# Patient Record
Sex: Female | Born: 1950 | Race: White | Hispanic: No | Marital: Married | Smoking: Former smoker
Health system: Southern US, Community
[De-identification: ages and names within clinical notes are randomized; demographics above are authoritative.]

## PROBLEM LIST (undated history)

## (undated) DIAGNOSIS — N200 Calculus of kidney: Secondary | ICD-10-CM

## (undated) DIAGNOSIS — E119 Type 2 diabetes mellitus without complications: Secondary | ICD-10-CM

## (undated) DIAGNOSIS — I714 Abdominal aortic aneurysm, without rupture, unspecified: Secondary | ICD-10-CM

## (undated) DIAGNOSIS — I5030 Unspecified diastolic (congestive) heart failure: Secondary | ICD-10-CM

## (undated) DIAGNOSIS — E519 Thiamine deficiency, unspecified: Secondary | ICD-10-CM

## (undated) DIAGNOSIS — G629 Polyneuropathy, unspecified: Secondary | ICD-10-CM

## (undated) DIAGNOSIS — S91309A Unspecified open wound, unspecified foot, initial encounter: Secondary | ICD-10-CM

## (undated) DIAGNOSIS — I10 Essential (primary) hypertension: Secondary | ICD-10-CM

## (undated) DIAGNOSIS — M549 Dorsalgia, unspecified: Secondary | ICD-10-CM

## (undated) DIAGNOSIS — I639 Cerebral infarction, unspecified: Secondary | ICD-10-CM

## (undated) DIAGNOSIS — L409 Psoriasis, unspecified: Secondary | ICD-10-CM

## (undated) DIAGNOSIS — G8929 Other chronic pain: Secondary | ICD-10-CM

## (undated) DIAGNOSIS — M779 Enthesopathy, unspecified: Secondary | ICD-10-CM

## (undated) HISTORY — PX: VESICOVAGINAL FISTULA CLOSURE W/ TAH: SUR271

## (undated) HISTORY — PX: BLADDER SUSPENSION: SHX72

## (undated) HISTORY — PX: ABDOMINAL HYSTERECTOMY: SHX81

---

## 2007-05-25 ENCOUNTER — Ambulatory Visit (HOSPITAL_COMMUNITY): Admission: RE | Admit: 2007-05-25 | Discharge: 2007-05-25 | Payer: Self-pay | Admitting: Family Medicine

## 2010-10-31 ENCOUNTER — Ambulatory Visit (HOSPITAL_COMMUNITY)
Admission: RE | Admit: 2010-10-31 | Discharge: 2010-10-31 | Payer: Self-pay | Source: Home / Self Care | Attending: Internal Medicine | Admitting: Internal Medicine

## 2012-03-08 ENCOUNTER — Other Ambulatory Visit: Payer: Self-pay | Admitting: Urology

## 2012-03-08 ENCOUNTER — Ambulatory Visit (INDEPENDENT_AMBULATORY_CARE_PROVIDER_SITE_OTHER): Payer: BC Managed Care – PPO | Admitting: Urology

## 2012-03-08 DIAGNOSIS — R1900 Intra-abdominal and pelvic swelling, mass and lump, unspecified site: Secondary | ICD-10-CM

## 2012-03-08 DIAGNOSIS — R3129 Other microscopic hematuria: Secondary | ICD-10-CM

## 2012-03-17 ENCOUNTER — Ambulatory Visit (HOSPITAL_COMMUNITY)
Admission: RE | Admit: 2012-03-17 | Discharge: 2012-03-17 | Disposition: A | Discharge status: Home / Self Care | Payer: BC Managed Care – PPO | Source: Ambulatory Visit | Attending: Urology | Admitting: Urology

## 2012-03-17 DIAGNOSIS — N200 Calculus of kidney: Secondary | ICD-10-CM | POA: Insufficient documentation

## 2012-03-17 DIAGNOSIS — K7689 Other specified diseases of liver: Secondary | ICD-10-CM | POA: Insufficient documentation

## 2012-03-17 DIAGNOSIS — R599 Enlarged lymph nodes, unspecified: Secondary | ICD-10-CM | POA: Insufficient documentation

## 2012-03-17 DIAGNOSIS — R197 Diarrhea, unspecified: Secondary | ICD-10-CM | POA: Insufficient documentation

## 2012-03-17 DIAGNOSIS — R1900 Intra-abdominal and pelvic swelling, mass and lump, unspecified site: Secondary | ICD-10-CM

## 2012-03-17 MED ORDER — IOHEXOL 300 MG/ML  SOLN
100.0000 mL | Freq: Once | INTRAMUSCULAR | Status: AC | PRN
  Administered 2012-03-17: 100 mL via INTRAVENOUS

## 2012-05-13 ENCOUNTER — Ambulatory Visit: Payer: BC Managed Care – PPO | Admitting: Urology

## 2012-07-15 ENCOUNTER — Ambulatory Visit: Payer: BC Managed Care – PPO | Admitting: Urology

## 2012-09-23 ENCOUNTER — Ambulatory Visit (INDEPENDENT_AMBULATORY_CARE_PROVIDER_SITE_OTHER): Payer: BC Managed Care – PPO | Admitting: Urology

## 2012-09-23 DIAGNOSIS — N200 Calculus of kidney: Secondary | ICD-10-CM

## 2012-09-23 DIAGNOSIS — R3129 Other microscopic hematuria: Secondary | ICD-10-CM

## 2012-09-23 DIAGNOSIS — D3500 Benign neoplasm of unspecified adrenal gland: Secondary | ICD-10-CM

## 2013-03-24 ENCOUNTER — Ambulatory Visit: Payer: BC Managed Care – PPO | Admitting: Urology

## 2013-05-05 ENCOUNTER — Ambulatory Visit (INDEPENDENT_AMBULATORY_CARE_PROVIDER_SITE_OTHER): Payer: BC Managed Care – PPO | Admitting: Urology

## 2013-05-05 DIAGNOSIS — N200 Calculus of kidney: Secondary | ICD-10-CM

## 2013-05-05 DIAGNOSIS — R3129 Other microscopic hematuria: Secondary | ICD-10-CM

## 2013-08-03 ENCOUNTER — Observation Stay (HOSPITAL_COMMUNITY): Payer: BC Managed Care – PPO

## 2013-08-03 ENCOUNTER — Emergency Department (HOSPITAL_COMMUNITY): Payer: BC Managed Care – PPO

## 2013-08-03 ENCOUNTER — Encounter (HOSPITAL_COMMUNITY): Payer: Self-pay | Admitting: Emergency Medicine

## 2013-08-03 ENCOUNTER — Observation Stay (HOSPITAL_COMMUNITY)
Admission: EM | Admit: 2013-08-03 | Discharge: 2013-08-04 | Disposition: A | Discharge status: Home / Self Care | Payer: BC Managed Care – PPO | Attending: Internal Medicine | Admitting: Internal Medicine

## 2013-08-03 DIAGNOSIS — L409 Psoriasis, unspecified: Secondary | ICD-10-CM | POA: Diagnosis present

## 2013-08-03 DIAGNOSIS — I10 Essential (primary) hypertension: Secondary | ICD-10-CM | POA: Diagnosis present

## 2013-08-03 DIAGNOSIS — L408 Other psoriasis: Secondary | ICD-10-CM | POA: Insufficient documentation

## 2013-08-03 DIAGNOSIS — E119 Type 2 diabetes mellitus without complications: Secondary | ICD-10-CM | POA: Insufficient documentation

## 2013-08-03 DIAGNOSIS — X58XXXA Exposure to other specified factors, initial encounter: Secondary | ICD-10-CM | POA: Insufficient documentation

## 2013-08-03 DIAGNOSIS — E1121 Type 2 diabetes mellitus with diabetic nephropathy: Secondary | ICD-10-CM | POA: Diagnosis present

## 2013-08-03 DIAGNOSIS — Z23 Encounter for immunization: Secondary | ICD-10-CM | POA: Insufficient documentation

## 2013-08-03 DIAGNOSIS — I6350 Cerebral infarction due to unspecified occlusion or stenosis of unspecified cerebral artery: Principal | ICD-10-CM | POA: Insufficient documentation

## 2013-08-03 DIAGNOSIS — G8929 Other chronic pain: Secondary | ICD-10-CM | POA: Diagnosis present

## 2013-08-03 DIAGNOSIS — M549 Dorsalgia, unspecified: Secondary | ICD-10-CM | POA: Insufficient documentation

## 2013-08-03 DIAGNOSIS — G629 Polyneuropathy, unspecified: Secondary | ICD-10-CM | POA: Diagnosis present

## 2013-08-03 DIAGNOSIS — I639 Cerebral infarction, unspecified: Secondary | ICD-10-CM

## 2013-08-03 DIAGNOSIS — S91309A Unspecified open wound, unspecified foot, initial encounter: Secondary | ICD-10-CM | POA: Insufficient documentation

## 2013-08-03 DIAGNOSIS — E876 Hypokalemia: Secondary | ICD-10-CM | POA: Insufficient documentation

## 2013-08-03 DIAGNOSIS — I63523 Cerebral infarction due to unspecified occlusion or stenosis of bilateral anterior cerebral arteries: Secondary | ICD-10-CM | POA: Diagnosis present

## 2013-08-03 HISTORY — DX: Other chronic pain: G89.29

## 2013-08-03 HISTORY — DX: Abdominal aortic aneurysm, without rupture, unspecified: I71.40

## 2013-08-03 HISTORY — DX: Type 2 diabetes mellitus without complications: E11.9

## 2013-08-03 HISTORY — DX: Dorsalgia, unspecified: M54.9

## 2013-08-03 HISTORY — DX: Calculus of kidney: N20.0

## 2013-08-03 HISTORY — DX: Polyneuropathy, unspecified: G62.9

## 2013-08-03 HISTORY — DX: Unspecified open wound, unspecified foot, initial encounter: S91.309A

## 2013-08-03 HISTORY — DX: Psoriasis, unspecified: L40.9

## 2013-08-03 HISTORY — DX: Enthesopathy, unspecified: M77.9

## 2013-08-03 HISTORY — DX: Abdominal aortic aneurysm, without rupture: I71.4

## 2013-08-03 HISTORY — DX: Essential (primary) hypertension: I10

## 2013-08-03 LAB — COMPREHENSIVE METABOLIC PANEL
ALT: 16 U/L (ref 0–35)
AST: 20 U/L (ref 0–37)
Alkaline Phosphatase: 89 U/L (ref 39–117)
CO2: 26 mEq/L (ref 19–32)
GFR calc Af Amer: >90 mL/min (ref >90)
Glucose, Bld: 262 mg/dL — ABNORMAL HIGH (ref 70–99)
Potassium: 3.4 mEq/L — ABNORMAL LOW (ref 3.5–5.1)
Sodium: 136 mEq/L (ref 135–145)
Total Protein: 8.1 g/dL (ref 6.0–8.3)

## 2013-08-03 LAB — GLUCOSE, CAPILLARY
Glucose-Capillary: 263 mg/dL — ABNORMAL HIGH (ref 70–99)
Glucose-Capillary: 170 mg/dL — ABNORMAL HIGH (ref 70–99)

## 2013-08-03 LAB — DIFFERENTIAL
Eosinophils Absolute: 0.1 10*3/uL (ref 0.0–0.7)
Lymphocytes Relative: 20 % (ref 12–46)
Lymphs Abs: 1.7 10*3/uL (ref 0.7–4.0)
Neutrophils Relative %: 72 % (ref 43–77)

## 2013-08-03 LAB — CBC
Platelets: 204 10*3/uL (ref 150–400)
RBC: 5.3 MIL/uL — ABNORMAL HIGH (ref 3.87–5.11)
WBC: 8.2 10*3/uL (ref 4.0–10.5)

## 2013-08-03 LAB — URINE MICROSCOPIC-ADD ON

## 2013-08-03 LAB — PROTIME-INR
INR: 1.01 (ref 0.00–1.49)
Prothrombin Time: 13.1 seconds (ref 11.6–15.2)

## 2013-08-03 LAB — RAPID URINE DRUG SCREEN, HOSP PERFORMED
Amphetamines: NOT DETECTED
Barbiturates: NOT DETECTED

## 2013-08-03 LAB — URINALYSIS, ROUTINE W REFLEX MICROSCOPIC
Glucose, UA: >1000 mg/dL — AB
Leukocytes, UA: NEGATIVE
Nitrite: NEGATIVE
Protein, ur: NEGATIVE mg/dL
Urobilinogen, UA: 0.2 mg/dL (ref 0.0–1.0)

## 2013-08-03 LAB — LIPID PANEL
Cholesterol: 179 mg/dL (ref 0–200)
LDL Cholesterol: 81 mg/dL (ref 0–99)
Total CHOL/HDL Ratio: 6.2 RATIO
Triglycerides: 344 mg/dL — ABNORMAL HIGH (ref <150)

## 2013-08-03 MED ORDER — ASPIRIN 300 MG RE SUPP
RECTAL | Status: AC
  Filled 2013-08-03: qty 1

## 2013-08-03 MED ORDER — INSULIN ASPART 100 UNIT/ML ~~LOC~~ SOLN
0.0000 [IU] | Freq: Three times a day (TID) | SUBCUTANEOUS | Status: DC
  Administered 2013-08-03: 8 [IU] via SUBCUTANEOUS
  Administered 2013-08-04: 5 [IU] via SUBCUTANEOUS
  Administered 2013-08-04 (×2): 3 [IU] via SUBCUTANEOUS

## 2013-08-03 MED ORDER — POTASSIUM CHLORIDE IN NACL 20-0.9 MEQ/L-% IV SOLN
INTRAVENOUS | Status: DC
  Administered 2013-08-03: 18:00:00 via INTRAVENOUS

## 2013-08-03 MED ORDER — SENNOSIDES-DOCUSATE SODIUM 8.6-50 MG PO TABS: 1.0000 | ORAL_TABLET | Freq: Every evening | ORAL | Status: DC | PRN

## 2013-08-03 MED ORDER — ENOXAPARIN SODIUM 40 MG/0.4ML ~~LOC~~ SOLN
40.0000 mg | SUBCUTANEOUS | Status: DC
  Administered 2013-08-03 – 2013-08-04 (×2): 40 mg via SUBCUTANEOUS
  Filled 2013-08-03 (×2): qty 0.4

## 2013-08-03 MED ORDER — LORAZEPAM 2 MG/ML IJ SOLN
1.0000 mg | Freq: Once | INTRAMUSCULAR | Status: AC
  Administered 2013-08-03: 1 mg via INTRAVENOUS
  Filled 2013-08-03: qty 1

## 2013-08-03 MED ORDER — ONDANSETRON HCL 4 MG/2ML IJ SOLN
4.0000 mg | Freq: Four times a day (QID) | INTRAMUSCULAR | Status: DC | PRN
  Administered 2013-08-03 – 2013-08-04 (×2): 4 mg via INTRAVENOUS
  Filled 2013-08-03 (×2): qty 2

## 2013-08-03 MED ORDER — ASPIRIN 300 MG RE SUPP
300.0000 mg | Freq: Every day | RECTAL | Status: DC
  Administered 2013-08-03 – 2013-08-04 (×2): 300 mg via RECTAL
  Filled 2013-08-03 (×5): qty 1

## 2013-08-03 MED ORDER — INSULIN ASPART 100 UNIT/ML ~~LOC~~ SOLN: 0.0000 [IU] | Freq: Every day | SUBCUTANEOUS | Status: DC

## 2013-08-03 MED ORDER — ASPIRIN 325 MG PO TABS: 325.0000 mg | ORAL_TABLET | Freq: Every day | ORAL | Status: DC

## 2013-08-03 MED ORDER — ONDANSETRON HCL 4 MG/5ML PO SOLN: 4.0000 mg | Freq: Four times a day (QID) | ORAL | Status: DC | PRN

## 2013-08-03 MED ORDER — PNEUMOCOCCAL VAC POLYVALENT 25 MCG/0.5ML IJ INJ
0.5000 mL | INJECTION | INTRAMUSCULAR | Status: AC
  Administered 2013-08-04: 0.5 mL via INTRAMUSCULAR
  Filled 2013-08-03: qty 0.5

## 2013-08-03 MED ORDER — INFLUENZA VAC SPLIT QUAD 0.5 ML IM SUSP
0.5000 mL | INTRAMUSCULAR | Status: AC
  Administered 2013-08-04: 0.5 mL via INTRAMUSCULAR
  Filled 2013-08-03: qty 0.5

## 2013-08-03 NOTE — ED Notes (Signed)
Patient c/o slurred speech, weakness in legs (unable to walk by self), and weakness in right arm that started yesterday. Per family patient unable to pick anything up with hands. Patient reports having similar "episiode" in past that "wore" off. Patient believed "episodes was linked to medication.

## 2013-08-03 NOTE — ED Notes (Signed)
When entering patient room to get vitals, patient has a noticeable difference in symmetry of her smile. Patient's right side drifts down when she smiles. This is the first time this nurse has assessed symmetry of patient's smile and the result to be asymmetrical. Additional neuro check performed. Slurred speech still present.

## 2013-08-03 NOTE — ED Notes (Signed)
Pt coughed with eating sweet potatoes. Advised NPO now.

## 2013-08-03 NOTE — ED Provider Notes (Addendum)
CSN: [id number]     Arrival date & time 08/03/13  0848 History  This chart was scribed for Ward Givens, MD by Quintella Reichert, ED scribe.  This patient was seen in room APA02/APA02 and the patient's care was started at 8:56 AM.   Chief Complaint  Patient presents with  . Aphasia  . Code Stroke    The history is provided by the patient and a relative. No language interpreter was used.    HPI Comments: Madison Reed is a 62 y.o. female with h/o DM and HTN who presents to the Emergency Department complaining of speech difficulty, right arm weakness, and bilateral leg weakness worse in her right leg.  All symptoms began yesterday afternoon about 2:30 pm while pt was "fixing to wash dishes."  She states that she knows what she wants to say but she can't say it.  In addition to her right arm feeling weak she notes that both legs feel weak and since symptoms began she has been unable to walk without assistance.  She states her weakness is worse in her right leg than her left.  Symptoms are unchanged since last night.  She denies headache, CP, SOB, difficulty eating or swallowing, nausea, vomiting, or dysuria.  Spouse states that pt had a episode last week with similar symptoms except that her weakness was equal in both legs.  This episode lasted slightly over an hour, then "she decided she wanted to go to bed, and when she got up she was fine."  She was seen by her PCP 3 days ago and advised her episode may have been attributable to hydrocodone which she takes regularly for her chronic pain, and which she had taken shortly before her first episode.  However she has not taken this since last week.  She also had multiple medications changed at that visit including her DM medication which was changed from an unspecificed medication to Janumet, and BP medication changed from Benicar to losartan.  She also had Cipro added for a UTI.  Pt denies family h/o stroke.  Her maternal grandfather had a heart attack at an  older age.  She is a current 3/4-pack-per day smoker.  She does not drink.  She lives at home with her husband.  She denies any pain including headache, chest pain, shortness of breath, difficulty swallowing. She denies nausea, vomiting, diarrhea, dysuria or frequency. She states she does now require help to walk.  Family history is negative for stroke, she did have a maternal grandfather who had an MI but no other cardiovascular disease.   PCP is Dr. Regino Schultze   Past Medical History  Diagnosis Date  . Diabetes mellitus without complication   . Hypertension   . Neuropathy   . Chronic back pain   . Bone spur     Past Surgical History  Procedure Laterality Date  . Vesicovaginal fistula closure w/ tah    . Abdominal hysterectomy    . Bladder suspension      Family History  Problem Relation Age of Onset  . Aneurysm Mother   . Diabetes Mother   . Hypertension Mother      History  Substance Use Topics  . Smoking status: Current Every Day Smoker -- 1.00 packs/day for 30 years    Types: Cigarettes  . Smokeless tobacco: Never Used  . Alcohol Use: No  lives at home Lives with spouse  OB History   Grav Para Term Preterm Abortions TAB SAB Ect Mult Living  2 2 2       2       Review of Systems  HENT: Negative for trouble swallowing.   Respiratory: Negative for shortness of breath.   Cardiovascular: Negative for chest pain.  Gastrointestinal: Negative for nausea and vomiting.  Genitourinary: Negative for dysuria.  Neurological: Positive for speech difficulty and weakness. Negative for headaches.  All other systems reviewed and are negative.     Allergies  Penicillins; Codeine; Hydrocodone-acetaminophen; and Shellfish allergy  Home Medications   Current Outpatient Rx  Name  Route  Sig  Dispense  Refill  . aspirin EC 81 MG tablet   Oral   Take 162 mg by mouth daily as needed.         . DiphenhydrAMINE HCl (BENADRYL PO)   Oral   Take 1 tablet by mouth daily as  needed (allergic reaction).         . gabapentin (NEURONTIN) 100 MG capsule   Oral   Take 2 capsules by mouth 3 (three) times daily.         Marland Kitchen losartan (COZAAR) 100 MG tablet   Oral   Take 1 tablet by mouth daily.         . SitaGLIPtin-MetFORMIN HCl (JANUMET PO)   Oral   Take 1 tablet by mouth 2 (two) times daily.         . ciprofloxacin (CIPRO) 500 MG tablet   Oral   Take 1 tablet by mouth 2 (two) times daily.           BP 122/97  Pulse 77  Temp(Src) 98 F (36.7 C)  Resp 20  SpO2 95%  Vital signs normal    Physical Exam  Nursing note and vitals reviewed. Constitutional: She is oriented to person, place, and time. She appears well-developed and well-nourished.  Non-toxic appearance. She does not appear ill. No distress.  HENT:  Head: Normocephalic and atraumatic.  Right Ear: External ear normal.  Left Ear: External ear normal.  Nose: Nose normal. No mucosal edema or rhinorrhea.  Mouth/Throat: Oropharynx is clear and moist and mucous membranes are normal. No dental abscesses or uvula swelling.  Eyes: Conjunctivae and EOM are normal. Pupils are equal, round, and reactive to light.  Neck: Normal range of motion and full passive range of motion without pain. Neck supple. Carotid bruit is not present.  Cardiovascular: Normal rate, regular rhythm and normal heart sounds.  Exam reveals no gallop and no friction rub.   No murmur heard. Pulmonary/Chest: Effort normal and breath sounds normal. No respiratory distress. She has no wheezes. She has no rhonchi. She has no rales. She exhibits no tenderness and no crepitus.  Abdominal: Soft. Normal appearance and bowel sounds are normal. She exhibits no distension. There is no tenderness. There is no rebound and no guarding.  Musculoskeletal: Normal range of motion. She exhibits no edema and no tenderness.  Moves all extremities well.   Neurological: She is alert and oriented to person, place, and time. She has normal  strength. No cranial nerve deficit.  Pt right handed, right grip is mildly weaker than the left No pronator drift Heel-to-shin intact Speaks mainly in one-word sentences.  Words are appropriate. FTN intact, ? Slightly unsteady with RUE  Skin: Skin is warm, dry and intact. No rash noted. No erythema. No pallor.  Has scattered small lesions that are raised and scaling c/w psoriasis.  Psychiatric: She has a normal mood and affect. Her behavior is normal. Her mood appears not  anxious. Her speech is delayed.    ED Course  Procedures (including critical care time)  Medications  LORazepam (ATIVAN) injection 1 mg (1 mg Intravenous Given 08/03/13 1018)     DIAGNOSTIC STUDIES: Oxygen Saturation is 95% on room air, adequate by my interpretation.    COORDINATION OF CARE: 9:06 AM: Discussed treatment plan which includes head CT, labs, EKG, IV fluids, oxygen therapy, and stroke swallow screen.  Pt and family expressed understanding and agreed to plan.  09:34 Dr Margo Aye called head CT results.  10:03 AM- Informed pt and family that head CT does not reveal a stroke and that MRI is advised.  Pt agrees to plan.  11:13 Dr Alfredo Batty, Radiology called back MR results, she has a left brainstem infarct  11:43 Dr Kerry Hough, admit to tele, team 1  Discussed MR results with patient and family and need for admission to further evaluate her to make sure she doesn't have more strokes. Discussed she was out of the window for thrombolytics. They want to know when she will improve, and discussed this was something that no one could tell them.       Labs Review Results for orders placed during the hospital encounter of 08/03/13  ETHANOL      Result Value Range   Alcohol, Ethyl (B) <11  0 - 11 mg/dL  PROTIME-INR      Result Value Range   Prothrombin Time 13.1  11.6 - 15.2 seconds   INR 1.01  0.00 - 1.49  APTT      Result Value Range   aPTT 29  24 - 37 seconds  CBC      Result Value Range   WBC 8.2  4.0 -  10.5 K/uL   RBC 5.30 (*) 3.87 - 5.11 MIL/uL   Hemoglobin 15.8 (*) 12.0 - 15.0 g/dL   HCT 16.1  09.6 - 04.5 %   MCV 86.6  78.0 - 100.0 fL   MCH 29.8  26.0 - 34.0 pg   MCHC 34.4  30.0 - 36.0 g/dL   RDW 40.9  81.1 - 91.4 %   Platelets 204  150 - 400 K/uL  DIFFERENTIAL      Result Value Range   Neutrophils Relative % 72  43 - 77 %   Neutro Abs 5.9  1.7 - 7.7 K/uL   Lymphocytes Relative 20  12 - 46 %   Lymphs Abs 1.7  0.7 - 4.0 K/uL   Monocytes Relative 6  3 - 12 %   Monocytes Absolute 0.5  0.1 - 1.0 K/uL   Eosinophils Relative 2  0 - 5 %   Eosinophils Absolute 0.1  0.0 - 0.7 K/uL   Basophils Relative 1  0 - 1 %   Basophils Absolute 0.0  0.0 - 0.1 K/uL  COMPREHENSIVE METABOLIC PANEL      Result Value Range   Sodium 136  135 - 145 mEq/L   Potassium 3.4 (*) 3.5 - 5.1 mEq/L   Chloride 99  96 - 112 mEq/L   CO2 26  19 - 32 mEq/L   Glucose, Bld 262 (*) 70 - 99 mg/dL   BUN 8  6 - 23 mg/dL   Creatinine, Ser 7.82  0.50 - 1.10 mg/dL   Calcium 9.4  8.4 - 95.6 mg/dL   Total Protein 8.1  6.0 - 8.3 g/dL   Albumin 3.8  3.5 - 5.2 g/dL   AST 20  0 - 37 U/L   ALT 16  0 -  35 U/L   Alkaline Phosphatase 89  39 - 117 U/L   Total Bilirubin 0.8  0.3 - 1.2 mg/dL   GFR calc non Af Amer >90  >90 mL/min   GFR calc Af Amer >90  >90 mL/min  URINE RAPID DRUG SCREEN (HOSP PERFORMED)      Result Value Range   Opiates NONE DETECTED  NONE DETECTED   Cocaine NONE DETECTED  NONE DETECTED   Benzodiazepines NONE DETECTED  NONE DETECTED   Amphetamines NONE DETECTED  NONE DETECTED   Tetrahydrocannabinol NONE DETECTED  NONE DETECTED   Barbiturates NONE DETECTED  NONE DETECTED  URINALYSIS, ROUTINE W REFLEX MICROSCOPIC      Result Value Range   Color, Urine YELLOW  YELLOW   APPearance CLEAR  CLEAR   Specific Gravity, Urine 1.015  1.005 - 1.030   pH 7.5  5.0 - 8.0   Glucose, UA >1000 (*) NEGATIVE mg/dL   Hgb urine dipstick SMALL (*) NEGATIVE   Bilirubin Urine NEGATIVE  NEGATIVE   Ketones, ur TRACE (*) NEGATIVE  mg/dL   Protein, ur NEGATIVE  NEGATIVE mg/dL   Urobilinogen, UA 0.2  0.0 - 1.0 mg/dL   Nitrite NEGATIVE  NEGATIVE   Leukocytes, UA NEGATIVE  NEGATIVE  TROPONIN I      Result Value Range   Troponin I <0.30  <0.30 ng/mL  GLUCOSE, CAPILLARY      Result Value Range   Glucose-Capillary 261 (*) 70 - 99 mg/dL  URINE MICROSCOPIC-ADD ON      Result Value Range   Squamous Epithelial / LPF RARE  RARE   RBC / HPF 3-6  <3 RBC/hpf   Bacteria, UA RARE  RARE   Laboratory interpretation all normal except mildly elevated Hb, mild hypokalemia, hyperglycemia    Imaging Review Ct Head Wo Contrast  08/03/2013   CLINICAL DATA:  62 year old female code stroke. Aphasia.  EXAM: CT HEAD WITHOUT CONTRAST  TECHNIQUE: Contiguous axial images were obtained from the base of the skull through the vertex without intravenous contrast.  COMPARISON:  None.  FINDINGS: Visualized paranasal sinuses and mastoids are clear. No acute osseous abnormality identified. Visualized orbits and scalp soft tissues are within normal limits.  Scattered bilateral subcortical white matter hypodensity. No midline shift, mass effect, or evidence of intracranial mass lesion. No ventriculomegaly. No acute intracranial hemorrhage identified. Calcified atherosclerosis at the skull base. No suspicious intracranial vascular hyperdensity. No evidence of cortically based acute infarction identified.  IMPRESSION: No acute cortically based infarct identified. No intracranial hemorrhage or acute intracranial abnormality.  Scattered nonspecific subcortical white matter hypodensity.  Study discussed by telephone with Dr. Devoria Albe on 08/03/2013 at 09:33 .   Electronically Signed   By: Augusto Gamble M.D.   On: 08/03/2013 09:34     Mr Brain Wo Contrast  08/03/2013   CLINICAL DATA:  Aphasia. Right lower extremity weakness. Question stroke.  EXAM: MRI HEAD WITHOUT CONTRAST  TECHNIQUE: Multiplanar, multisequence MR imaging was performed. No intravenous contrast was  administered.  COMPARISON:  CT head without contrast 08/03/2013  FINDINGS: The diffusion-weighted images demonstrate a left paramedian infarct involving the left pons and inferior brainstem. No other acute infarction is evident. T2 hyperintensities are present with is infarct. The age advanced atrophy and extensive white matter disease is present bilaterally the over the if cerebral hemispheres. No mass lesion is present.  Midline structures are otherwise within normal limits.  Flow is present in the major intracranial arteries. The globes and orbits are intact. The  paranasal sinuses and mastoid air cells are clear.  A punctate area of remote hemorrhage is noted in the right thalamus. No other significant hemorrhage is present.  IMPRESSION: 1. Acute nonhemorrhagic left paramedian and brainstem infarct involving the upper pons and lower midbrain. 2. Atrophy and extensive white matter disease bilaterally. This is markedly advanced for age, most likely reflecting the sequelae of chronic microvascular ischemia.  These results were called by telephone at the time of interpretation on 08/03/2013 at 11:20 AM to Dr. Devoria Albe , who verbally acknowledged these results.   Electronically Signed   By: Gennette Pac M.D.   On: 08/03/2013 11:20     EKG Interpretation     Ventricular Rate:  75 PR Interval:  174 QRS Duration: 96 QT Interval:  410 QTC Calculation: 457 R Axis:   77 Text Interpretation:  Normal sinus rhythm Normal ECG No previous ECGs available            MDM   1. Stroke    Plan admission   Devoria Albe, MD, FACEP   I personally performed the services described in this documentation, which was scribed in my presence. The recorded information has been reviewed and considered.  Devoria Albe, MD, FACEP    Ward Givens, MD 08/03/13 1157  Ward Givens, MD 08/03/13 1159

## 2013-08-03 NOTE — ED Notes (Signed)
EDP at bedside  

## 2013-08-03 NOTE — ED Notes (Signed)
Pt has slight right side mouth droop noted during 215 neuro check that is new from previous neuro checks. NP that just seen pt is aware.

## 2013-08-03 NOTE — ED Notes (Addendum)
Patient to ED c/o slurred speech since yesterday. Patient is roomed in ED with triage nurses. EDP present at bedside. Patient's daughter is present at bedside. Patient is alert and oriented X4. Patient slurs speech slightly. Patient active and able to follow instructions, cooperative. Patient reports that she had a similar episode last week, patient believes it could be due to her change of medications that she started last week. Patient was put on Janumet, Losartan, and Cirpro last week, all new drugs to patient.

## 2013-08-03 NOTE — ED Notes (Signed)
Patient's daughter informed this nurse that the patient has been seen by her primary doctor for 2 ulcers on the bottom of her right foot. Daughter is unsure of whether condition is related to the ulcers. Ulcer's assessed by this nurse. Dr. Lynelle Doctor notified of both wounds on patient's right foot.

## 2013-08-03 NOTE — ED Notes (Signed)
Patient has gotten choked up on her food 2 different occasions within the last 10 minutes. Patient's daughter at bedside assisted patient. Daughter states that the patient does not have her back teeth in and that could be contributing to the problem. Dr Lynelle Doctor notified and advised to let the patient eat the sweet potatoes and soft foods such as applesauce. Sweet potatoes given to patient. Will continue to monitor. Daughter at bedside.

## 2013-08-03 NOTE — H&P (Signed)
Patient seen and examined. Agree with that as above per Toya Smothers, NP  Patient is admitted with acute left-sided pontine infarct. She will need to further workup including MRA of the head, echocardiogram and carotid Dopplers. Neurology has been consulted. She'll need to be started on daily aspirin for secondary prevention. Neurology has been consulted. Physical therapy and speech therapy consult syncope or. She will be on aspirin suppository until she is cleared by speech therapy.  Parker Wherley

## 2013-08-03 NOTE — H&P (Signed)
[facility] History and Physical  KENDRICK REMIGIO ZOX:[id number] DOB: 10/30/1950 DOA: 08/03/2013  Referring physician:  PCP: Cassell Smiles., MD  Specialists:   Chief Complaint: Slurred each rates weakness and  HPI: Madison Reed is a 62 y.o. female with a past medical history that includes diabetes, hypertension, chronic back pain, neuropathy presents to the emergency department with the chief complaint of slurred speech and right-sided weakness. Information is obtained from the patient and her daughter who is at the bedside. The daughter reports that yesterday afternoon around 2:30 the patient called her on the telephone for help. The daughter went to the patient's home and found her with slurred speech and unsteady gait and weak right upper extremity. Daughter reports that the patient has had several episodes such as this over the last couple of weeks but they were very short in duration. Patient reports she had an episode 2 days ago but she thought it was a reaction to new medication that she was taking as the symptoms resolved after she took Benadryl and slept through the night. Current symptoms have been ongoing since 2:30 yesterday afternoon. Patient indicates that her symptoms are no better or worse since that time  Of note she saw her PCP 3 days ago at that time her diabetic oral medication and her antihypertensives were changed. In addition she was diagnosed with a UTI and started on Cipro. Patient denies any chest pain palpitations headache visual disturbances syncope or near-syncope. She denies any shortness of breath abdominal pain nausea vomiting constipation or diarrhea. She does indicate that she has diabetic neuropathy and therefore her extremities are tingling to 1 or another all the time. She denies any fever chills dysuria hematuria frequency or urgency. Of note patient has 2 nonhealing wounds on the bottom of her left foot. She reports her PCP was going to refer her to "somebody  in [place]". Workup in the emergency room includes an MRI today yields Acute nonhemorrhagic left paramedian and brainstem infarct involving the upper pons and lower midbrain. Atrophy and extensive white matter disease bilaterally. This is  markedly advanced for age, most likely reflecting the sequelae of chronic microvascular ischemia. Lab work significant for capillary glucose of 261 and a potassium level of 3.4 otherwise unremarkable. No signs in the emergency room were stable. Symptoms came on suddenly have persisted are characterized as moderate to severe. We are asked to admit for further evaluation and treatment.  Review of Systems: 10 point review of systems completed and all systems are negative except as indicated in history of present illness  Past Medical History  Diagnosis Date  . Diabetes mellitus without complication   . Hypertension   . Neuropathy   . Chronic back pain   . Bone spur   . Wound, open, foot   . Psoriasis    Past Surgical History  Procedure Laterality Date  . Vesicovaginal fistula closure w/ tah    . Abdominal hysterectomy    . Bladder suspension     Social History:  reports that she has been smoking Cigarettes.  She has a 30 pack-year smoking history. She has never used smokeless tobacco. She reports that she does not drink alcohol or use illicit drugs. Patient smokes about half a pack of cigarettes a day unbeknownst to her husband with whom she lives. She is currently on a pole he didn't applying for disability. She denies any EtOH or illicit drug use. Allergies  Allergen Reactions  . Penicillins Anaphylaxis  . Codeine Itching  .  Hydrocodone-Acetaminophen Itching  . Shellfish Allergy Itching    Family History  Problem Relation Age of Onset  . Aneurysm Mother   . Diabetes Mother   . Hypertension Mother    mother is 79 years old and has hypertension with no history of stroke heart attack or cancer. Father deceased he had complications from diabetes.  Patient has 3 siblings and their collective medical history is positive for hypertension and diabetes negative for stroke heart attack cancer  Prior to Admission medications   Medication Sig Start Date End Date Taking? Authorizing Provider  aspirin EC 81 MG tablet Take 162 mg by mouth daily as needed.   Yes Historical Provider, MD  DiphenhydrAMINE HCl (BENADRYL PO) Take 1 tablet by mouth daily as needed (allergic reaction).   Yes Historical Provider, MD  gabapentin (NEURONTIN) 100 MG capsule Take 2 capsules by mouth 3 (three) times daily. 07/31/13  Yes Historical Provider, MD  losartan (COZAAR) 100 MG tablet Take 1 tablet by mouth daily. 07/31/13  Yes Historical Provider, MD  SitaGLIPtin-MetFORMIN HCl (JANUMET PO) Take 1 tablet by mouth 2 (two) times daily.   Yes Historical Provider, MD  ciprofloxacin (CIPRO) 500 MG tablet Take 1 tablet by mouth 2 (two) times daily. 07/31/13   Historical Provider, MD   Physical Exam: Filed Vitals:   08/03/13 1410  BP: 127/85  Pulse: 77  Temp: 98 F (36.7 C)  Resp: 20     General:  Well-nourished face is flushed no acute distress  Eyes: Pupils are equal round reactive to light, EOMI, no scleral icterus  ENT: Ears clear nose without drainage oropharynx without erythema or exudate. Mucous membranes of her mouth are pink slightly dry  Neck: Supple no JVD full range of motion no lymphadenopathy  Cardiovascular: Regular rate and rhythm no murmur no gallop no rub. No lower extremity edema. Pedal pulses are present and palpable  Respiratory: Normal effort. Breath sounds are slightly diminished in the bases otherwise clear bilaterally. No wheeze no rhonchi  Abdomen: Soft positive bowel sounds throughout nontender to palpation. No mass organomegaly noted.  Skin: Scattered small lesions that are raised and scaling on her elbows shins feet consistent with psoriasis. Underside of left that with 2 open nondraining wounds. Clearly somewhat chronic as tissue dry  and cracked with dark center. No odor noted   Musculoskeletal: Moves all extremities. Joints without swelling or erythema.  Psychiatric: calm and cooperative  Neurologic:  Speech is very slurred with right facial droop. Right grip 4/5 left grip 5 out of 5 strength. Lower extremity strength 5 out of 5. Sensation intact. Heel to shin intact. Of note patient passed initial are in bedside swallow eval. After given food she had a couple episodes of choking. Was offered soft foods and did okay. During my exam she slipped on water and immediately began coughing and choking.  Labs on Admission:  Basic Metabolic Panel:  Recent Labs Lab 08/03/13 0922  NA 136  K 3.4*  CL 99  CO2 26  GLUCOSE 262*  BUN 8  CREATININE 0.62  CALCIUM 9.4   Liver Function Tests:  Recent Labs Lab 08/03/13 0922  AST 20  ALT 16  ALKPHOS 89  BILITOT 0.8  PROT 8.1  ALBUMIN 3.8   No results found for this basename: LIPASE, AMYLASE,  in the last 168 hours No results found for this basename: AMMONIA,  in the last 168 hours CBC:  Recent Labs Lab 08/03/13 0922  WBC 8.2  NEUTROABS 5.9  HGB 15.8*  HCT 45.9  MCV 86.6  PLT 204   Cardiac Enzymes:  Recent Labs Lab 08/03/13 0922  TROPONINI <0.30    BNP (last 3 results) No results found for this basename: PROBNP,  in the last 8760 hours CBG:  Recent Labs Lab 08/03/13 0859  GLUCAP 261*    Radiological Exams on Admission: Ct Head Wo Contrast  08/03/2013   CLINICAL DATA:  62 year old female code stroke. Aphasia.  EXAM: CT HEAD WITHOUT CONTRAST  TECHNIQUE: Contiguous axial images were obtained from the base of the skull through the vertex without intravenous contrast.  COMPARISON:  None.  FINDINGS: Visualized paranasal sinuses and mastoids are clear. No acute osseous abnormality identified. Visualized orbits and scalp soft tissues are within normal limits.  Scattered bilateral subcortical white matter hypodensity. No midline shift, mass effect, or  evidence of intracranial mass lesion. No ventriculomegaly. No acute intracranial hemorrhage identified. Calcified atherosclerosis at the skull base. No suspicious intracranial vascular hyperdensity. No evidence of cortically based acute infarction identified.  IMPRESSION: No acute cortically based infarct identified. No intracranial hemorrhage or acute intracranial abnormality.  Scattered nonspecific subcortical white matter hypodensity.  Study discussed by telephone with Dr. Devoria Albe on 08/03/2013 at 09:33 .   Electronically Signed   By: Augusto Gamble M.D.   On: 08/03/2013 09:34   Mr Brain Wo Contrast  08/03/2013   CLINICAL DATA:  Aphasia. Right lower extremity weakness. Question stroke.  EXAM: MRI HEAD WITHOUT CONTRAST  TECHNIQUE: Multiplanar, multisequence MR imaging was performed. No intravenous contrast was administered.  COMPARISON:  CT head without contrast 08/03/2013  FINDINGS: The diffusion-weighted images demonstrate a left paramedian infarct involving the left pons and inferior brainstem. No other acute infarction is evident. T2 hyperintensities are present with is infarct. The age advanced atrophy and extensive white matter disease is present bilaterally the over the if cerebral hemispheres. No mass lesion is present.  Midline structures are otherwise within normal limits.  Flow is present in the major intracranial arteries. The globes and orbits are intact. The paranasal sinuses and mastoid air cells are clear.  A punctate area of remote hemorrhage is noted in the right thalamus. No other significant hemorrhage is present.  IMPRESSION: 1. Acute nonhemorrhagic left paramedian and brainstem infarct involving the upper pons and lower midbrain. 2. Atrophy and extensive white matter disease bilaterally. This is markedly advanced for age, most likely reflecting the sequelae of chronic microvascular ischemia.  These results were called by telephone at the time of interpretation on 08/03/2013 at 11:20 AM to  Dr. Devoria Albe , who verbally acknowledged these results.   Electronically Signed   By: Gennette Pac M.D.   On: 08/03/2013 11:20    EKG: Independently reviewed normal sinus rhythm  Assessment/Plan Principal Problem:   Acute ischemic stroke: MRI yields acute nonhemorrhagic left paramedian and brainstem infarct involving the upper pons and lower midbrain.  Atrophy and extensive white matter disease bilaterally. This is markedly advanced for age, most likely reflecting the sequelae of chronic microvascular ischemia. Patient with history of diabetes hypertension smoking. Will admit to telemetry for observation. CT and MRI as above. Will get a 2-D echo and carotid Dopplers. Will check hemoglobin A1c TSH and lipid panel. Will provide aspirin. Will request PT and OT consult. Given patient's swallowing issues will request speech therapy for bedside swallow eval and placed on aspiration precautions and make n.p.o. Will request a neuro consult.  Active Problems: Hypokalemia: Mild. Likely do to decrease by mouth intake over the last 24  hours. We'll provide gentle IV hydration with potassium added to his. Will recheck in the a.m.  Wound, open, foot: somewhat chronic. Reported wounding her foot several months ago and 2 areas have been nonhealing. Will request wound consult.     Diabetes mellitus without complication: Patient on oral medications that was recently changed. Will hold for now. Will get a hemoglobin A1c and use sliding scale insulin for optimal diabetic control. Will request diabetic coordinator consult for diet education.     Hypertension: Stable in the emergency department. Systolic blood pressure range [phone]. I medications include Cozaar. Will hold for now to allow for permissive hypertension. Will provide hydralazine with parameters when necessary. Will monitor closely     Neuropathy: Appears stable at baseline. Patient takes Neurontin at home. Will hold for now do to n.p.o. status.      Chronic back pain: Appears stable at baseline. I medications do not include any narcotic pain medicine.    Psoriasis: Appears stable at baseline. Will monitor    Neurology   Code Status: full  Family Communication: husband and daughter at bedside  Disposition Plan: home when ready hopefully 24-36 hours   Time spent: 40 minutes  Gwenyth Bender [facility] Pager [phone]  If 7PM-7AM, please contact night-coverage [email or web address] Password [facility] 08/03/2013, 2:43 PM

## 2013-08-03 NOTE — ED Notes (Signed)
Patient concerned about elevated blood pressure. States that it is not normally this high. States that she takes Losartan in the evenings, did take it last night. Patient states that she has not checked her blood pressure at home since switching medication, despite the fact that she has a blood pressure cuff at home. Dr. Lynelle Doctor states that her elevated blood pressure it likely due to the fact that the patient was very anxious about the MRI and patient had returned from MRI 5 minutes prior.

## 2013-08-04 DIAGNOSIS — I517 Cardiomegaly: Secondary | ICD-10-CM

## 2013-08-04 LAB — GLUCOSE, CAPILLARY: Glucose-Capillary: 228 mg/dL — ABNORMAL HIGH (ref 70–99)

## 2013-08-04 LAB — HEMOGLOBIN A1C: Hgb A1c MFr Bld: 9.4 % — ABNORMAL HIGH (ref <5.7)

## 2013-08-04 MED ORDER — ASPIRIN 325 MG PO TABS: 325.0000 mg | ORAL_TABLET | Freq: Every day | ORAL | Status: DC

## 2013-08-04 MED ORDER — SIMVASTATIN 20 MG PO TABS: 20.0000 mg | ORAL_TABLET | Freq: Every day | ORAL | Status: DC

## 2013-08-04 MED ORDER — SIMVASTATIN 20 MG PO TABS
20.0000 mg | ORAL_TABLET | Freq: Every day | ORAL | Status: DC
  Administered 2013-08-04: 20 mg via ORAL
  Filled 2013-08-04: qty 1

## 2013-08-04 MED ORDER — ONDANSETRON HCL 4 MG PO TABS
4.0000 mg | ORAL_TABLET | Freq: Three times a day (TID) | ORAL | Status: DC | PRN
  Administered 2013-08-04: 4 mg via ORAL
  Filled 2013-08-04: qty 1

## 2013-08-04 NOTE — Care Management Note (Signed)
    Page 1 of 1   08/04/2013     1:53:18 PM   CARE MANAGEMENT NOTE 08/04/2013  Patient:  Madison Reed, Madison Reed   Account Number:  [id number]  Date Initiated:  08/04/2013  Documentation initiated by:  Sharrie Rothman  Subjective/Objective Assessment:   Pt admitted from home with CVA. Pt lives with her husband and will return home at discharge. Pt has been fairly independent with ADL's.     Action/Plan:   Will await PT recommendations for any DME and HH needs. Pt for possible d/c today.   Anticipated DC Date:  08/05/2013   Anticipated DC Plan:  HOME W HOME HEALTH SERVICES      DC Planning Services  CM consult      Choice offered to / List presented to:             Status of service:  Completed, signed off Medicare Important Message given?   (If response is "NO", the following Medicare IM given date fields will be blank) Date Medicare IM given:   Date Additional Medicare IM given:    Discharge Disposition:  HOME/SELF CARE  Per UR Regulation:    If discussed at Long Length of Stay Meetings, dates discussed:    Comments:   08/04/13 1350 Arlyss Queen, RN BSN CM Pt discharged home today. Outpt PT has been arranged at [facility] and they will call pt with appt time. Pt and pts nurse aware of discharge arrangements. 08/04/13 1155 Arlyss Queen, RN BSN CM

## 2013-08-04 NOTE — Discharge Summary (Signed)
Physician Discharge Summary  ELANORE TALCOTT WUJ:[id number] DOB: 22-Nov-1950 DOA: 08/03/2013  PCP: Cassell Smiles., MD  Admit date: 08/03/2013 Discharge date: 08/04/2013  Time spent: 40 minutes  Recommendations for Outpatient Follow-up:  1. Follow up with PCP 1 week for evaluation of symptoms 2. Outpatient Physical therapy/speech therapy 3. Out patient podiatry referral recommended 4. Will need surveillance of left carotid artery stenosis with serial carotid dopplers every 6-12 months, currently 50-69% stenosis.  If this progresses, would need vascular surgery referral  Discharge Diagnoses:  Principal Problem:   Acute ischemic stroke Active Problems:   Diabetes mellitus without complication   Hypertension   Neuropathy   Chronic back pain   Psoriasis   Wound, open, foot   Hypokalemia   Discharge Condition: stable and ready for discharge  Diet recommendation: dysphagia 3 (mechanical soft) thin liquids  Filed Weights   08/04/13 0359  Weight: 84.823 kg (187 lb)    History of present illness:  Madison Reed is a 62 y.o. female with a past medical history that includes diabetes, hypertension, chronic back pain, neuropathy presented to the emergency department on 08/03/13 with the chief complaint of slurred speech and right-sided weakness. The daughter reported that 08/02/13 afternoon around 2:30 the patient called her on the telephone for help. The daughter went to the patient's home and found her with slurred speech and unsteady gait and weak right upper extremity. Daughter reported that the patient  had several episodes such as this over the last couple of weeks but they were very short in duration. Patient reported she had an episode 2 days ago but she thought it was a reaction to new medication that she was taking as the symptoms resolved after she took Benadryl and slept through the night. Current symptoms have been ongoing since 2:30 yesterday afternoon. Patient indicated that her  symptoms are no better or worse since that time Of note she saw her PCP 3 days ago at that time her diabetic oral medication and her antihypertensives were changed. In addition she was diagnosed with a UTI and started on Cipro. Patient denied any chest pain palpitations headache visual disturbances syncope or near-syncope. She denied any shortness of breath abdominal pain nausea vomiting constipation or diarrhea. She indicated that she has diabetic neuropathy and therefore her extremities are tingling do to one degree or another all the time. She denied any fever chills dysuria hematuria frequency or urgency. Of note patient had 2 nonhealing wounds on the bottom of her left foot. She reported her PCP was going to refer her to "somebody in [place]". Workup in the emergency room included an MRI yielding Acute nonhemorrhagic left paramedian and brainstem infarct involving the upper pons and lower midbrain. Atrophy and extensive white matter disease bilaterally. This is markedly advanced for age, most likely reflecting the sequelae of chronic microvascular ischemia. Lab work significant for capillary glucose of 261 and a potassium level of 3.4 otherwise unremarkable. vital signs in the emergency room were stable.   Hospital Course:  Acute ischemic stroke: MRI yields acute nonhemorrhagic left paramedian and brainstem infarct involving the upper pons and lower midbrain. Atrophy and extensive white matter disease bilaterally. This is markedly advanced for age, most likely reflecting the sequelae of chronic microvascular ischemia. Patient with history of diabetes hypertension smoking. Carotid Dopplers yields heterogeneous and irregular atherosclerotic plaque results in an estimated at 50- 69% diameter stenosis in the left internal carotid artery. Heterogeneous and diffusely irregular atherosclerotic plaque on the right resulting in less than  50% diameter internal carotid artery stenosis. Bilateral vertebral arteries are  patent with normal antegrade flow. MRA yields high-grade stenosis involving portions of the posterior cerebral artery bilaterally. This is most notable involving the P1 segment of  the left posterior cerebral artery and the mid to distal aspect of the right posterior cerebral artery. Hemoglobin A1c 9.4. Lipid panel yields triglycerides 344 HDL 29. Aspirin 325 started. Patient prescribed asa at home but she reported non-compliance. Physical therapy evaluation recommending OP PT and walker. ST evaluation recommending dysphagia 3 mechanical soft thin liquid diet. Pt evaluated by neurology who opined that aspirin for antiplatelet therapy and statin to holt the progression of the intracranial atherosclerotic disease.  Active Problems:  Hypokalemia: Mild. Likely do to decrease by mouth intake over the last 24 hours. Repleted. Recommend recheck at PCP follow up.   Wound, open, foot: somewhat chronic. Obtained wound consult who yield Pt reports blood blister on the right plantar surface at the great toe from a lawnmower several months ago and the other site which is plantar mid foot she does not report any trauma. Pt with callous in very similar sites on the left foot. Pt with history of DM and would suspect peripheral neuropathy. She is not wearing therapeutic foot wear. 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 Central dry wound beds and circumferential hyperkeratosis. Measurement:rigth great toe 2cm with central 1.0cm opening at 0.5cm depth. Right mid-plantar foot: 1cm circular hyperkeratotic area with 0.5cm central darkness but not fluctuant. Wound bed: open area is pale and dry Drainage (amount, consistency, odor) minimal noted by bedside nursing on her sock last evening.  Recommend Hydrogel to maintain moisture to the open wound, however best practice for wound healing is appropriate offloading and serial debridement of the hyperkeratotic  skin. I have recommended podiatry consultation as an outpatient for this patient.   Diabetes mellitus without complication: Patient on oral medications that was recently changed. A1c 9.4. Resume oral meds at discharge. Recommend close OP follow up for optimal glycemic control   Hypertension: Remained stable in the hospital. Will resume home cozaar at discharge.   Neuropathy: Appeared stable at baseline. Patient takes Neurontin   Chronic back pain: Appeared stable at baseline.  Continue home pain medication  Psoriasis: Appears stable at baseline. OP follow up   Procedures: Consultations:  neurology  Discharge Exam: Filed Vitals:   08/04/13 1104  BP: 169/69  Pulse: 66  Temp: 98 F (36.7 C)  Resp: 20    General: well nourished NAD Cardiovascular: RRR No MGR No LE edema Respiratory: normal effort BS clear bilaterally Neuro: speech slightly slurred but improved from yesterday.   Discharge Instructions     Medication List    STOP taking these medications       aspirin EC 81 MG tablet  Replaced by:  aspirin 325 MG tablet      TAKE these medications       aspirin 325 MG tablet  Take 1 tablet (325 mg total) by mouth daily.     BENADRYL PO  Take 1 tablet by mouth daily as needed (allergic reaction).     ciprofloxacin 500 MG tablet  Commonly known as:  CIPRO  Take 1 tablet by mouth 2 (two) times daily.     gabapentin 100 MG capsule  Commonly known as:  NEURONTIN  Take 2 capsules by mouth 3 (three) times daily.     JANUMET PO  Take 1 tablet by mouth 2 (  two) times daily.     losartan 100 MG tablet  Commonly known as:  COZAAR  Take 1 tablet by mouth daily.     simvastatin 20 MG tablet  Commonly known as:  ZOCOR  Take 1 tablet (20 mg total) by mouth daily at 6 PM.       Allergies  Allergen Reactions  . Penicillins Anaphylaxis  . Codeine Itching  . Hydrocodone-Acetaminophen Itching  . Shellfish Allergy Itching       Follow-up Information   Follow up  with Cassell Smiles., MD. Schedule an appointment as soon as possible for a visit in 1 week. (for evaluation of symptoms)    Specialty:  Internal Medicine   Contact information:   [address] [place] [address] [phone]        The results of significant diagnostics from this hospitalization (including imaging, microbiology, ancillary and laboratory) are listed below for reference.    Significant Diagnostic Studies: Ct Head Wo Contrast  08/03/2013   CLINICAL DATA:  62 year old female code stroke. Aphasia.  EXAM: CT HEAD WITHOUT CONTRAST  TECHNIQUE: Contiguous axial images were obtained from the base of the skull through the vertex without intravenous contrast.  COMPARISON:  None.  FINDINGS: Visualized paranasal sinuses and mastoids are clear. No acute osseous abnormality identified. Visualized orbits and scalp soft tissues are within normal limits.  Scattered bilateral subcortical white matter hypodensity. No midline shift, mass effect, or evidence of intracranial mass lesion. No ventriculomegaly. No acute intracranial hemorrhage identified. Calcified atherosclerosis at the skull base. No suspicious intracranial vascular hyperdensity. No evidence of cortically based acute infarction identified.  IMPRESSION: No acute cortically based infarct identified. No intracranial hemorrhage or acute intracranial abnormality.  Scattered nonspecific subcortical white matter hypodensity.  Study discussed by telephone with Dr. Devoria Albe on 08/03/2013 at 09:33 .   Electronically Signed   By: Augusto Gamble M.D.   On: 08/03/2013 09:34   Mr Maxine Glenn Head Wo Contrast  08/03/2013   CLINICAL DATA:  Acute left paramedian brainstem infarct.  EXAM: MRA HEAD WITHOUT CONTRAST  TECHNIQUE: MRA HEAD WITHOUT CONTRAST  COMPARISON:  Brain MR 08/03/2013.  FINDINGS: Moderate to marked narrowing of portions of the cavernous and supraclinoid segment of the right internal carotid artery.  Mild to moderate  narrowing of portions of the cavernous segment of the left anterior cerebral artery.  Moderate to marked tandem stenosis of the M1 segment of the left middle cerebral artery.  Mild to moderate narrowing proximal A1 segment left anterior cerebral artery.  Moderate to marked narrowing of portions of the middle cerebral artery branch vessels bilaterally.  Right vertebral artery is dominant.  Mild narrowing distal left vertebral artery.  Moderate narrowing of portions of the posterior inferior cerebellar artery bilaterally.  Mild narrowing of the basilar artery.  Non visualization the anterior inferior cerebellar artery bilaterally.  Moderate tandem stenosis of the superior cerebellar artery bilaterally.  High-grade stenosis involving portions of the posterior cerebral artery bilaterally. This is most notable involving the P1 segment of the left posterior cerebral artery and the mid to distal aspect of the right posterior cerebral artery.  No aneurysm noted on this motion degraded exam.  IMPRESSION: Prominent intracranial atherosclerotic type changes as detailed above.   Electronically Signed   By: Bridgett Larsson M.D.   On: 08/03/2013 19:12   Mr Brain Wo Contrast  08/03/2013   CLINICAL DATA:  Aphasia. Right lower extremity weakness. Question stroke.  EXAM: MRI HEAD WITHOUT CONTRAST  TECHNIQUE: Multiplanar, multisequence MR imaging was performed. No intravenous contrast was administered.  COMPARISON:  CT head without contrast 08/03/2013  FINDINGS: The diffusion-weighted images demonstrate a left paramedian infarct involving the left pons and inferior brainstem. No other acute infarction is evident. T2 hyperintensities are present with is infarct. The age advanced atrophy and extensive white matter disease is present bilaterally the over the if cerebral hemispheres. No mass lesion is present.  Midline structures are otherwise within normal limits.  Flow is present in the major intracranial arteries. The globes and  orbits are intact. The paranasal sinuses and mastoid air cells are clear.  A punctate area of remote hemorrhage is noted in the right thalamus. No other significant hemorrhage is present.  IMPRESSION: 1. Acute nonhemorrhagic left paramedian and brainstem infarct involving the upper pons and lower midbrain. 2. Atrophy and extensive white matter disease bilaterally. This is markedly advanced for age, most likely reflecting the sequelae of chronic microvascular ischemia.  These results were called by telephone at the time of interpretation on 08/03/2013 at 11:20 AM to Dr. Devoria Albe , who verbally acknowledged these results.   Electronically Signed   By: Gennette Pac M.D.   On: 08/03/2013 11:20   US Carotid Duplex Bilateral  08/03/2013   CLINICAL DATA:  Stroke  EXAM: BILATERAL CAROTID DUPLEX ULTRASOUND  TECHNIQUE: Wallace Cullens scale imaging, color Doppler and duplex ultrasound were performed of bilateral carotid and vertebral arteries in the neck.  COMPARISON:  Brain MRI earlier today at 10:34 a.m.  FINDINGS: Criteria: Quantification of carotid stenosis is based on velocity parameters that correlate the residual internal carotid diameter with NASCET-based stenosis levels, using the diameter of the distal internal carotid lumen as the denominator for stenosis measurement.  The following velocity measurements were obtained:  RIGHT  ICA:  115/60 cm/sec  CCA:  80/8 cm/sec  SYSTOLIC ICA/CCA RATIO:  1.4  DIASTOLIC ICA/CCA RATIO:  2.1  ECA:  155 cm/sec  LEFT  ICA:  133/23 cm/sec  CCA:  81/12 cm/sec  SYSTOLIC ICA/CCA RATIO:  1.7  DIASTOLIC ICA/CCA RATIO:  1.9  ECA:  149 cm/sec  RIGHT CAROTID ARTERY: Heterogeneous and irregular partially calcified plaque in the right common carotid artery and bulb extending into the proximal internal carotid artery. No evidence of hemodynamically significant stenosis by grayscale, color Doppler, pulse Doppler or spectral waveform analysis. Marland Kitchen  RIGHT VERTEBRAL ARTERY:  Patent with normal antegrade  flow.  LEFT CAROTID ARTERY: Heterogeneous and irregular plaque in the distal common carotid artery and carotid bulb extending into the proximal internal carotid artery. Mildly elevated peak systolic velocity in the proximal and mid internal carotid artery. Normal spectral waveforms.  LEFT VERTEBRAL ARTERY:  Patent with normal antegrade flow.  IMPRESSION: 1. Heterogeneous and irregular atherosclerotic plaque results in an estimated at 50- 69% diameter stenosis in the left internal carotid artery. 2. Heterogeneous and diffusely irregular atherosclerotic plaque on the right resulting in less than 50% diameter internal carotid artery stenosis. 3. Bilateral vertebral arteries are patent with normal antegrade flow. Signed,  Sterling Big, MD  [facility]  [facility]   Electronically Signed   By: Malachy Moan M.D.   On: 08/03/2013 16:56    Microbiology: No results found for this or any previous visit (from the past 240 hour(s)).   Labs: Basic Metabolic Panel:  Recent Labs Lab 08/03/13 0922  NA 136  K 3.4*  CL 99  CO2 26  GLUCOSE 262*  BUN 8  CREATININE 0.62  CALCIUM 9.4   Liver Function Tests:  Recent Labs Lab 08/03/13 0922  AST 20  ALT 16  ALKPHOS 89  BILITOT 0.8  PROT 8.1  ALBUMIN 3.8   No results found for this basename: LIPASE, AMYLASE,  in the last 168 hours No results found for this basename: AMMONIA,  in the last 168 hours CBC:  Recent Labs Lab 08/03/13 0922  WBC 8.2  NEUTROABS 5.9  HGB 15.8*  HCT 45.9  MCV 86.6  PLT 204   Cardiac Enzymes:  Recent Labs Lab 08/03/13 0922  TROPONINI <0.30   BNP: BNP (last 3 results) No results found for this basename: PROBNP,  in the last 8760 hours CBG:  Recent Labs Lab 08/03/13 0859 08/03/13 1710 08/03/13 2108 08/04/13 0747 08/04/13 1116  GLUCAP 261* 263* 170* 198* 198*       Signed:  BLACK,KAREN M  [facility] 08/04/2013, 1:49 PM  Attending  note:  Patient seen and examined.  Agree with note as above per Toya Smothers, NP.  Patient admitted with acute ischemic stroke. She has multiple risk factors including HTN, DM, hyperlipidemia and tobacco use.  She was not consistently taking antiplatelet agents prior to admission. She has been started on daily aspirin. She was seen by neurology while in the hospital. Diabetes is uncontrolled and she is on oral agents. This will be further adjusted by her primary care physician. LDL is in acceptable range and she is already on a statin. She has significant left internal carotid artery stenosis which will need continued surveillance and possible vascular surgery referral if this progresses. She's been strongly advised to quit smoking. She will need close followup with her primary care physician to manage these issues. She is stable for discharge.  MEMON,JEHANZEB

## 2013-08-04 NOTE — Evaluation (Signed)
Clinical/Bedside Swallow Evaluation Patient Details  Name: Madison Reed MRN: [id number] Date of Birth: 27-Nov-1950  Today's Date: 08/04/2013 Time: 0850-0905 SLP Time Calculation (min): 15 min  Past Medical History:  Past Medical History  Diagnosis Date  . Diabetes mellitus without complication   . Hypertension   . Neuropathy   . Chronic back pain   . Bone spur   . Wound, open, foot   . Psoriasis   . Abdominal aneurysm   . Kidney stones    Past Surgical History:  Past Surgical History  Procedure Laterality Date  . Vesicovaginal fistula closure w/ tah    . Abdominal hysterectomy    . Bladder suspension     HPI:  Madison Reed is a 62 y.o. female with a past medical history that includes diabetes, hypertension, chronic back pain, neuropathy presents to the emergency department with the chief complaint of slurred speech and right-sided weakness. Information is obtained from the patient and her daughter who is at the bedside. The daughter reports that yesterday afternoon around 2:30 the patient called her on the telephone for help. The daughter went to the patient's home and found her with slurred speech and unsteady gait and weak right upper extremity. Daughter reports that the patient has had several episodes such as this over the last couple of weeks but they were very short in duration. Patient reports she had an episode 2 days ago but she thought it was a reaction to new medication that she was taking as the symptoms resolved after she took Benadryl and slept through the night. Current symptoms have been ongoing since 2:30 yesterday afternoon. Patient indicates that her symptoms are no better or worse since that time  Of note she saw her PCP 3 days ago at that time her diabetic oral medication and her antihypertensives were changed. In addition she was diagnosed with a UTI and started on Cipro. Patient denies any chest pain palpitations headache visual disturbances syncope or  near-syncope. She denies any shortness of breath abdominal pain nausea vomiting constipation or diarrhea. She does indicate that she has diabetic neuropathy and therefore her extremities are tingling to 1 or another all the time. She denies any fever chills dysuria hematuria frequency or urgency. Of note patient has 2 nonhealing wounds on the bottom of her left foot. She reports her PCP was going to refer her to "somebody in [place]." Workup in the emergency room includes an MRI today yields Acute nonhemorrhagic left paramedian and brainstem infarct involving the upper pons and lower midbrain. Atrophy and extensive white matter disease bilaterally. This is     Assessment / Plan / Recommendation Clinical Impression  Pt seen for BSE today before she returns home. Initially, pt c/o severe thirst. Family reported prior coughing with solid foods. Provided thin liquids and cracker, pt demonstrated no overt s/sx of aspiration and/or penetration with thin liquids. She exhibited slow rate and small sips independently. With cracker, she did present delayed cough and was able to swallow more effectively with liquid was to moisten remaining oral residue. SLP recommended moist mechanical soft with thin liquids given strict aspiration precautions. SLP educated family members and pt on risky foods such as foods with hulls, dry or tough foods, and some mixed consistencies. Family in agreeance and receptive to all recommendations. No f/u needed in acute setting due to pt d/c today. However, pt may benefit from f/u in next setting to provide ongoing education for swallowing safety.     Aspiration Risk  Mild    Diet Recommendation Dysphagia 3 (Mechanical Soft);No mixed consistencies;Thin liquid   Liquid Administration via: Cup Medication Administration: Whole meds with liquid Supervision: Patient able to self feed Compensations: Slow rate;Small sips/bites Postural Changes and/or Swallow Maneuvers: Seated upright 90  degrees;Upright 30-60 min after meal    Other  Recommendations Oral Care Recommendations: Oral care BID   Follow Up Recommendations  Other (comment);Home health SLP    Frequency and Duration        Pertinent Vitals/Pain No pain reported.     SLP Swallow Goals  N/A   Swallow Study Prior Functional Status  Type of Home: House    General Date of Onset: 08/04/13 HPI: Madison Reed is a 62 y.o. female with a past medical history that includes diabetes, hypertension, chronic back pain, neuropathy presents to the emergency department with the chief complaint of slurred speech and right-sided weakness. Information is obtained from the patient and her daughter who is at the bedside. The daughter reports that yesterday afternoon around 2:30 the patient called her on the telephone for help. The daughter went to the patient's home and found her with slurred speech and unsteady gait and weak right upper extremity. Daughter reports that the patient has had several episodes such as this over the last couple of weeks but they were very short in duration. Patient reports she had an episode 2 days ago but she thought it was a reaction to new medication that she was taking as the symptoms resolved after she took Benadryl and slept through the night. Current symptoms have been ongoing since 2:30 yesterday afternoon. Patient indicates that her symptoms are no better or worse since that time  Of note she saw her PCP 3 days ago at that time her diabetic oral medication and her antihypertensives were changed. In addition she was diagnosed with a UTI and started on Cipro. Patient denies any chest pain palpitations headache visual disturbances syncope or near-syncope. She denies any shortness of breath abdominal pain nausea vomiting constipation or diarrhea. She does indicate that she has diabetic neuropathy and therefore her extremities are tingling to 1 or another all the time. She denies any fever chills dysuria  hematuria frequency or urgency. Of note patient has 2 nonhealing wounds on the bottom of her left foot. She reports her PCP was going to refer her to "somebody in [place]." Workup in the emergency room includes an MRI today yields Acute nonhemorrhagic left paramedian and brainstem infarct involving the upper pons and lower midbrain. Atrophy and extensive white matter disease bilaterally. This is   Type of Study: Bedside swallow evaluation Previous Swallow Assessment: N/A Diet Prior to this Study: NPO Temperature Spikes Noted: No Respiratory Status: Room air Behavior/Cognition: Alert;Cooperative;Pleasant mood Oral Cavity - Dentition: Adequate natural dentition Self-Feeding Abilities: Able to feed self Patient Positioning: Upright in bed Baseline Vocal Quality: Clear Volitional Cough: Strong Volitional Swallow: Able to elicit    Oral/Motor/Sensory Function Overall Oral Motor/Sensory Function: Appears within functional limits for tasks assessed   Ice Chips Ice chips: Not tested   Thin Liquid Thin Liquid: Within functional limits Presentation: Cup;Self Fed    Nectar Thick Nectar Thick Liquid: Not tested   Honey Thick Honey Thick Liquid: Not tested   Puree Puree: Not tested   Solid   GO    Solid: Impaired Presentation: Self Fed Pharyngeal Phase Impairments: Cough - Delayed       Jeylin Woodmansee S 08/04/2013,9:48 AM

## 2013-08-04 NOTE — Consult Note (Signed)
[facility] Madison Reed A. Gerilyn Pilgrim, MD     [email or web address]          Madison Reed is an 62 y.o. female.   ASSESSMENT/PLAN:  1. Left pontine lacunar infarct in the lady with multiple risk factors include hypertension, diabetes, dyslipidemia and obesity. The patient previously was not on antiplatelet agents and therefore aspirin should suffice. 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 The patient also should be placed on a statin medication to holt the progression of the intracranial atherosclerotic disease.   2. Diabetic polyneuropathy.  3. Significant intracranial white matter disease likely compensation of diabetes and hypertension.  The patient is 62 year old white female who presents with what appears to be a stuttering crescendo course of right-sided weakness and dysarthria. Symptoms apparently been present over the last 3 days or so. Last events seem to have not improved resulting in the patient coming to the hospital for further evaluation. She he has not been on antiplatelet agent. She has multiple risk factors as outlined below. Imaging shows acute left pontine infarct with extensive white matter chronic ischemic changes. MRA also shows multivessel intracranial occlusive disease. The patient has had some difficulties with ulcers involving the feet. She also has issues with psoriasis that are quite extensive. Otherwise, she does not complain of other issues. The review of systems is otherwise negative.  GENERAL: This is a pleasant overweight/obese lady in no acute distress.  HEENT: Neck is supple and head is normocephalic/atraumatic.  ABDOMEN: soft  EXTREMITIES: No edema   BACK: Unremarkable.  SKIN: Normal by inspection.    MENTAL STATUS: The patient is alert and lucid. She has good insight and language and comprehension seems good. She has a rather moderately severe dysarthria however.   CRANIAL NERVES: Pupils are equal, round and  reactive to light and accommodation; extra ocular movements are full, there is no significant nystagmus; visual fields are full; upper and lower facial muscles are normal in strength and symmetric, there is no flattening of the nasolabial folds; tongue is midline; uvula is midline; shoulder elevation is normal.  MOTOR: There is a mild right upper extremity pronator drift. Direct strength testing is only mildly weak graded as 4+ hand grip and triceps. Deltoid 5. Right leg is normal both proximally and distally. The left side shows normal tone, bulk and strength.  COORDINATION: Left finger to nose is normal, right finger to nose is slightly dysmetric commensurate with the associated weakness, No rest tremor; no intention tremor; no postural tremor; no bradykinesia.  REFLEXES: Deep tendon reflexes are symmetrical and normal.   SENSATION: Normal to light touch.  GAIT: She is in a wheelchair.    Past Medical History  Diagnosis Date  . Diabetes mellitus without complication   . Hypertension   . Neuropathy   . Chronic back pain   . Bone spur   . Wound, open, foot   . Psoriasis   . Abdominal aneurysm   . Kidney stones     Past Surgical History  Procedure Laterality Date  . Vesicovaginal fistula closure w/ tah    . Abdominal hysterectomy    . Bladder suspension      Family History  Problem Relation Age of Onset  . Aneurysm Mother   . Diabetes Mother   . Hypertension Mother     Social History:  reports that she has been smoking Cigarettes.  She has a 30 pack-year smoking history. She has never used smokeless tobacco. She reports that she does not  drink alcohol or use illicit drugs.  Allergies:  Allergies  Allergen Reactions  . Penicillins Anaphylaxis  . Codeine Itching  . Hydrocodone-Acetaminophen Itching  . Shellfish Allergy Itching    Medications: Prior to Admission medications   Medication Sig Start Date End Date Taking? Authorizing Provider  aspirin EC 81 MG tablet  Take 162 mg by mouth daily as needed.   Yes Historical Provider, MD  DiphenhydrAMINE HCl (BENADRYL PO) Take 1 tablet by mouth daily as needed (allergic reaction).   Yes Historical Provider, MD  gabapentin (NEURONTIN) 100 MG capsule Take 2 capsules by mouth 3 (three) times daily. 07/31/13  Yes Historical Provider, MD  losartan (COZAAR) 100 MG tablet Take 1 tablet by mouth daily. 07/31/13  Yes Historical Provider, MD  SitaGLIPtin-MetFORMIN HCl (JANUMET PO) Take 1 tablet by mouth 2 (two) times daily.   Yes Historical Provider, MD  ciprofloxacin (CIPRO) 500 MG tablet Take 1 tablet by mouth 2 (two) times daily. 07/31/13   Historical Provider, MD      Scheduled Meds: . aspirin  300 mg Rectal Daily  . enoxaparin (LOVENOX) injection  40 mg Subcutaneous Q24H  . insulin aspart  0-15 Units Subcutaneous TID WC  . insulin aspart  0-5 Units Subcutaneous QHS   Continuous Infusions: . 0.9 % NaCl with KCl 20 mEq / L 50 mL/hr at 08/03/13 1824   PRN Meds:.ondansetron (ZOFRAN) IV, senna-docusate   Blood pressure 152/70, pulse 77, temperature 98.3 F (36.8 C), temperature source Oral, resp. rate 20, height 5\' 6"  (1.676 m), weight 84.823 kg (187 lb), SpO2 93.00%.   Results for orders placed during the hospital encounter of 08/03/13 (from the past 48 hour(s))  GLUCOSE, CAPILLARY     Status: Abnormal   Collection Time    08/03/13  8:59 AM      Result Value Range   Glucose-Capillary 261 (*) 70 - 99 mg/dL  ETHANOL     Status: None   Collection Time    08/03/13  9:22 AM      Result Value Range   Alcohol, Ethyl (B) <11  0 - 11 mg/dL   Comment:            LOWEST DETECTABLE LIMIT FOR     SERUM ALCOHOL IS 11 mg/dL     FOR MEDICAL PURPOSES ONLY  PROTIME-INR     Status: None   Collection Time    08/03/13  9:22 AM      Result Value Range   Prothrombin Time 13.1  11.6 - 15.2 seconds   INR 1.01  0.00 - 1.49  APTT     Status: None   Collection Time    08/03/13  9:22 AM      Result Value Range   aPTT 29   24 - 37 seconds  CBC     Status: Abnormal   Collection Time    08/03/13  9:22 AM      Result Value Range   WBC 8.2  4.0 - 10.5 K/uL   RBC 5.30 (*) 3.87 - 5.11 MIL/uL   Hemoglobin 15.8 (*) 12.0 - 15.0 g/dL   HCT 40.9  81.1 - 91.4 %   MCV 86.6  78.0 - 100.0 fL   MCH 29.8  26.0 - 34.0 pg   MCHC 34.4  30.0 - 36.0 g/dL   RDW 78.2  95.6 - 21.3 %   Platelets 204  150 - 400 K/uL  DIFFERENTIAL     Status: None   Collection Time  08/03/13  9:22 AM      Result Value Range   Neutrophils Relative % 72  43 - 77 %   Neutro Abs 5.9  1.7 - 7.7 K/uL   Lymphocytes Relative 20  12 - 46 %   Lymphs Abs 1.7  0.7 - 4.0 K/uL   Monocytes Relative 6  3 - 12 %   Monocytes Absolute 0.5  0.1 - 1.0 K/uL   Eosinophils Relative 2  0 - 5 %   Eosinophils Absolute 0.1  0.0 - 0.7 K/uL   Basophils Relative 1  0 - 1 %   Basophils Absolute 0.0  0.0 - 0.1 K/uL  COMPREHENSIVE METABOLIC PANEL     Status: Abnormal   Collection Time    08/03/13  9:22 AM      Result Value Range   Sodium 136  135 - 145 mEq/L   Potassium 3.4 (*) 3.5 - 5.1 mEq/L   Chloride 99  96 - 112 mEq/L   CO2 26  19 - 32 mEq/L   Glucose, Bld 262 (*) 70 - 99 mg/dL   BUN 8  6 - 23 mg/dL   Creatinine, Ser 3.08  0.50 - 1.10 mg/dL   Calcium 9.4  8.4 - 65.7 mg/dL   Total Protein 8.1  6.0 - 8.3 g/dL   Albumin 3.8  3.5 - 5.2 g/dL   AST 20  0 - 37 U/L   ALT 16  0 - 35 U/L   Alkaline Phosphatase 89  39 - 117 U/L   Total Bilirubin 0.8  0.3 - 1.2 mg/dL   GFR calc non Af Amer >90  >90 mL/min   GFR calc Af Amer >90  >90 mL/min   Comment: (NOTE)     The eGFR has been calculated using the CKD EPI equation.     This calculation has not been validated in all clinical situations.     eGFR's persistently <90 mL/min signify possible Chronic Kidney     Disease.  TROPONIN I     Status: None   Collection Time    08/03/13  9:22 AM      Result Value Range   Troponin I <0.30  <0.30 ng/mL   Comment:            Due to the release kinetics of cTnI,     a  negative result within the first hours     of the onset of symptoms does not rule out     myocardial infarction with certainty.     If myocardial infarction is still suspected,     repeat the test at appropriate intervals.  URINE RAPID DRUG SCREEN (HOSP PERFORMED)     Status: None   Collection Time    08/03/13  9:36 AM      Result Value Range   Opiates NONE DETECTED  NONE DETECTED   Cocaine NONE DETECTED  NONE DETECTED   Benzodiazepines NONE DETECTED  NONE DETECTED   Amphetamines NONE DETECTED  NONE DETECTED   Tetrahydrocannabinol NONE DETECTED  NONE DETECTED   Barbiturates NONE DETECTED  NONE DETECTED   Comment:            DRUG SCREEN FOR MEDICAL PURPOSES     ONLY.  IF CONFIRMATION IS NEEDED     FOR ANY PURPOSE, NOTIFY LAB     WITHIN 5 DAYS.                LOWEST DETECTABLE LIMITS  FOR URINE DRUG SCREEN     Drug Class       Cutoff (ng/mL)     Amphetamine      1000     Barbiturate      200     Benzodiazepine   200     Tricyclics       300     Opiates          300     Cocaine          300     THC              50  URINALYSIS, ROUTINE W REFLEX MICROSCOPIC     Status: Abnormal   Collection Time    08/03/13  9:36 AM      Result Value Range   Color, Urine YELLOW  YELLOW   APPearance CLEAR  CLEAR   Specific Gravity, Urine 1.015  1.005 - 1.030   pH 7.5  5.0 - 8.0   Glucose, UA >1000 (*) NEGATIVE mg/dL   Hgb urine dipstick SMALL (*) NEGATIVE   Bilirubin Urine NEGATIVE  NEGATIVE   Ketones, ur TRACE (*) NEGATIVE mg/dL   Protein, ur NEGATIVE  NEGATIVE mg/dL   Urobilinogen, UA 0.2  0.0 - 1.0 mg/dL   Nitrite NEGATIVE  NEGATIVE   Leukocytes, UA NEGATIVE  NEGATIVE  URINE MICROSCOPIC-ADD ON     Status: None   Collection Time    08/03/13  9:36 AM      Result Value Range   Squamous Epithelial / LPF RARE  RARE   RBC / HPF 3-6  <3 RBC/hpf   Bacteria, UA RARE  RARE  LIPID PANEL     Status: Abnormal   Collection Time    08/03/13  3:07 PM      Result Value Range   Cholesterol 179   0 - 200 mg/dL   Triglycerides 696 (*) <150 mg/dL   HDL 29 (*) >29 mg/dL   Total CHOL/HDL Ratio 6.2     VLDL 69 (*) 0 - 40 mg/dL   LDL Cholesterol 81  0 - 99 mg/dL   Comment:            Total Cholesterol/HDL:CHD Risk     Coronary Heart Disease Risk Table                         Men   Women      1/2 Average Risk   3.4   3.3      Average Risk       5.0   4.4      2 X Average Risk   9.6   7.1      3 X Average Risk  23.4   11.0                Use the calculated Patient Ratio     above and the CHD Risk Table     to determine the patient's CHD Risk.                ATP III CLASSIFICATION (LDL):      <100     mg/dL   Optimal      528-413  mg/dL   Near or Above                        Optimal      130-159  mg/dL  Borderline      160-189  mg/dL   High      >478     mg/dL   Very High  HEMOGLOBIN A1C     Status: Abnormal   Collection Time    08/03/13  3:11 PM      Result Value Range   Hemoglobin A1C 9.4 (*) <5.7 %   Comment: (NOTE)                                                                               According to the ADA Clinical Practice Recommendations for 2011, when     HbA1c is used as a screening test:      >=6.5%   Diagnostic of Diabetes Mellitus               (if abnormal result is confirmed)     5.7-6.4%   Increased risk of developing Diabetes Mellitus     References:Diagnosis and Classification of Diabetes Mellitus,Diabetes     Care,2011,34(Suppl 1):S62-S69 and Standards of Medical Care in             Diabetes - 2011,Diabetes Care,2011,34 (Suppl 1):S11-S61.   Mean Plasma Glucose 223 (*) <117 mg/dL   Comment: Performed at [facility]  GLUCOSE, CAPILLARY     Status: Abnormal   Collection Time    08/03/13  5:10 PM      Result Value Range   Glucose-Capillary 263 (*) 70 - 99 mg/dL   Comment 1 Notify RN    GLUCOSE, CAPILLARY     Status: Abnormal   Collection Time    08/03/13  9:08 PM      Result Value Range   Glucose-Capillary 170 (*) 70 - 99 mg/dL    Comment 1 Documented in Chart     Comment 2 Notify RN    GLUCOSE, CAPILLARY     Status: Abnormal   Collection Time    08/04/13  7:47 AM      Result Value Range   Glucose-Capillary 198 (*) 70 - 99 mg/dL   Comment 1 Documented in Chart     Comment 2 Notify RN      Ct Head Wo Contrast  08/03/2013   CLINICAL DATA:  62 year old female code stroke. Aphasia.  EXAM: CT HEAD WITHOUT CONTRAST  TECHNIQUE: Contiguous axial images were obtained from the base of the skull through the vertex without intravenous contrast.  COMPARISON:  None.  FINDINGS: Visualized paranasal sinuses and mastoids are clear. No acute osseous abnormality identified. Visualized orbits and scalp soft tissues are within normal limits.  Scattered bilateral subcortical white matter hypodensity. No midline shift, mass effect, or evidence of intracranial mass lesion. No ventriculomegaly. No acute intracranial hemorrhage identified. Calcified atherosclerosis at the skull base. No suspicious intracranial vascular hyperdensity. No evidence of cortically based acute infarction identified.  IMPRESSION: No acute cortically based infarct identified. No intracranial hemorrhage or acute intracranial abnormality.  Scattered nonspecific subcortical white matter hypodensity.  Study discussed by telephone with Dr. Devoria Albe on 08/03/2013 at 09:33 .   Electronically Signed   By: Augusto Gamble M.D.   On: 08/03/2013 09:34   Mr Maxine Glenn Head Wo Contrast  08/03/2013  CLINICAL DATA:  Acute left paramedian brainstem infarct.  EXAM: MRA HEAD WITHOUT CONTRAST  TECHNIQUE: MRA HEAD WITHOUT CONTRAST  COMPARISON:  Brain MR 08/03/2013.  FINDINGS: Moderate to marked narrowing of portions of the cavernous and supraclinoid segment of the right internal carotid artery.  Mild to moderate narrowing of portions of the cavernous segment of the left anterior cerebral artery.  Moderate to marked tandem stenosis of the M1 segment of the left middle cerebral artery.  Mild to moderate  narrowing proximal A1 segment left anterior cerebral artery.  Moderate to marked narrowing of portions of the middle cerebral artery branch vessels bilaterally.  Right vertebral artery is dominant.  Mild narrowing distal left vertebral artery.  Moderate narrowing of portions of the posterior inferior cerebellar artery bilaterally.  Mild narrowing of the basilar artery.  Non visualization the anterior inferior cerebellar artery bilaterally.  Moderate tandem stenosis of the superior cerebellar artery bilaterally.  High-grade stenosis involving portions of the posterior cerebral artery bilaterally. This is most notable involving the P1 segment of the left posterior cerebral artery and the mid to distal aspect of the right posterior cerebral artery.  No aneurysm noted on this motion degraded exam.  IMPRESSION: Prominent intracranial atherosclerotic type changes as detailed above.   Electronically Signed   By: Bridgett Larsson M.D.   On: 08/03/2013 19:12   Mr Brain Wo Contrast  08/03/2013   CLINICAL DATA:  Aphasia. Right lower extremity weakness. Question stroke.  EXAM: MRI HEAD WITHOUT CONTRAST  TECHNIQUE: Multiplanar, multisequence MR imaging was performed. No intravenous contrast was administered.  COMPARISON:  CT head without contrast 08/03/2013  FINDINGS: The diffusion-weighted images demonstrate a left paramedian infarct involving the left pons and inferior brainstem. No other acute infarction is evident. T2 hyperintensities are present with is infarct. The age advanced atrophy and extensive white matter disease is present bilaterally the over the if cerebral hemispheres. No mass lesion is present.  Midline structures are otherwise within normal limits.  Flow is present in the major intracranial arteries. The globes and orbits are intact. The paranasal sinuses and mastoid air cells are clear.  A punctate area of remote hemorrhage is noted in the right thalamus. No other significant hemorrhage is present.   IMPRESSION: 1. Acute nonhemorrhagic left paramedian and brainstem infarct involving the upper pons and lower midbrain. 2. Atrophy and extensive white matter disease bilaterally. This is markedly advanced for age, most likely reflecting the sequelae of chronic microvascular ischemia.  These results were called by telephone at the time of interpretation on 08/03/2013 at 11:20 AM to Dr. Devoria Albe , who verbally acknowledged these results.   Electronically Signed   By: Gennette Pac M.D.   On: 08/03/2013 11:20   US Carotid Duplex Bilateral  08/03/2013   CLINICAL DATA:  Stroke  EXAM: BILATERAL CAROTID DUPLEX ULTRASOUND  TECHNIQUE: Wallace Cullens scale imaging, color Doppler and duplex ultrasound were performed of bilateral carotid and vertebral arteries in the neck.  COMPARISON:  Brain MRI earlier today at 10:34 a.m.  FINDINGS: Criteria: Quantification of carotid stenosis is based on velocity parameters that correlate the residual internal carotid diameter with NASCET-based stenosis levels, using the diameter of the distal internal carotid lumen as the denominator for stenosis measurement.  The following velocity measurements were obtained:  RIGHT  ICA:  115/60 cm/sec  CCA:  80/8 cm/sec  SYSTOLIC ICA/CCA RATIO:  1.4  DIASTOLIC ICA/CCA RATIO:  2.1  ECA:  155 cm/sec  LEFT  ICA:  133/23 cm/sec  CCA:  81/12 cm/sec  SYSTOLIC ICA/CCA RATIO:  1.7  DIASTOLIC ICA/CCA RATIO:  1.9  ECA:  149 cm/sec  RIGHT CAROTID ARTERY: Heterogeneous and irregular partially calcified plaque in the right common carotid artery and bulb extending into the proximal internal carotid artery. No evidence of hemodynamically significant stenosis by grayscale, color Doppler, pulse Doppler or spectral waveform analysis. Marland Kitchen  RIGHT VERTEBRAL ARTERY:  Patent with normal antegrade flow.  LEFT CAROTID ARTERY: Heterogeneous and irregular plaque in the distal common carotid artery and carotid bulb extending into the proximal internal carotid artery. Mildly elevated peak  systolic velocity in the proximal and mid internal carotid artery. Normal spectral waveforms.  LEFT VERTEBRAL ARTERY:  Patent with normal antegrade flow.  IMPRESSION: 1. Heterogeneous and irregular atherosclerotic plaque results in an estimated at 50- 69% diameter stenosis in the left internal carotid artery. 2. Heterogeneous and diffusely irregular atherosclerotic plaque on the right resulting in less than 50% diameter internal carotid artery stenosis. 3. Bilateral vertebral arteries are patent with normal antegrade flow. Signed,  Sterling Big, MD  [facility]  [facility]   Electronically Signed   By: Malachy Moan M.D.   On: 08/03/2013 16:56        Madison Reed A. Gerilyn Pilgrim, M.D.  Diplomate, Biomedical engineer of Psychiatry and Neurology ( Neurology). 08/04/2013, 8:45 AM

## 2013-08-04 NOTE — Consult Note (Signed)
WOC wound consult note Reason for Consult: evaluation of foot wounds.  Pt reports blood blister on the right plantar surface at the great toe from a lawnmower several months ago and the other site which is plantar mid foot she does not report any trauma.  Pt with callous in very similar sites on the left foot. Pt with history of DM and would suspect peripheral neuropathy. She is not wearing therapeutic foot wear.   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  Central dry wound beds and circumferential hyperkeratosis.    Measurement:rigth great toe 2cm with central 1.0cm opening at 0.5cm depth.  Right mid-plantar foot: 1cm circular hyperkeratotic area with 0.5cm central darkness but not fluctuant.  Wound bed: open area is pale and dry Drainage (amount, consistency, odor) minimal noted by bedside nursing on her sock last evening. Dressing procedure/placement/frequency: Hydrogel to maintain moisture to the open wound, however best practice for wound healing is appropriate offloading and serial  debridement of the hyperkeratotic skin.  I have recommended podiatry consultation as an outpatient for this patient.    Discussed POC with patient and bedside nurse.  Re consult if needed, will not follow at this time. Thanks  Edelin Fryer [employer], CWOCN [phone])

## 2013-08-04 NOTE — Evaluation (Signed)
Physical Therapy Evaluation Patient Details Name: Madison Reed MRN: [id number] DOB: 16-Oct-1950 Today's Date: 08/04/2013 Time: 1130-1202 PT Time Calculation (min): 32 min  PT Assessment / Plan / Recommendation History of Present Illness  Pt is admitted with a left pontine stroke.  She has a hx of DM, HTN, chronic lumbar pain and neuropathy.  She lives with her husband and had been independent with all ADLs PTA.  She c/o slurred speech, right sided weakness and unsteady gait.  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  This may also impact gait.  Clinical Impression   Pt is seen for evaluation and found to have decreased balance and proprioception which impedes gait stability.  She tends to fall to the left during gait and now needs a walker for maximal stability.  I would recommend OP PT for rehab and she is agreeable.    PT Assessment  All further PT needs can be met in the next venue of care    Follow Up Recommendations  Outpatient PT    Does the patient have the potential to tolerate intense rehabilitation      Barriers to Discharge        Equipment Recommendations  None recommended by PT    Recommendations for Other Services     Frequency      Precautions / Restrictions Precautions Precautions: Fall Restrictions Weight Bearing Restrictions: No   Pertinent Vitals/Pain       Mobility  Bed Mobility Bed Mobility: Supine to Sit Supine to Sit: 7: Independent;HOB flat Transfers Transfers: Sit to Stand;Stand to Sit Sit to Stand: 7: Independent;From bed Stand to Sit: 7: Independent;To bed Ambulation/Gait Ambulation/Gait Assistance: 4: Min assist;5: Supervision Ambulation Distance (Feet): 160 Feet Assistive device: Rolling walker;None Gait Pattern: Right steppage;Lateral trunk lean to left Gait velocity: wnl with a walker General Gait  Details: Pt's gait is unstable without assistive device as she falls to the left.  With a walker, she is much more stable but still needs supervision as she tends to be slightly impulsive and misjudges her capability. Stairs: No Wheelchair Mobility Wheelchair Mobility: No    Exercises     PT Diagnosis: Abnormality of gait  PT Problem List: Decreased balance;Decreased mobility;Decreased knowledge of use of DME;Decreased safety awareness;Impaired sensation PT Treatment Interventions:       PT Goals(Current goals can be found in the care plan section) Acute Rehab PT Goals PT Goal Formulation: No goals set, d/c therapy  Visit Information  Last PT Received On: 08/04/13 History of Present Illness: Pt is admitted with a left pontine stroke.  She has a hx of DM, HTN, chronic lumbar pain and neuropathy.  She lives with her husband and had been independent with all ADLs PTA.  She c/o slurred speech, right sided weakness and unsteady gait.  She has not seen much improvement of sx since admission.       Prior Functioning  Home Living Family/patient expects to be discharged to:: Private residence Living Arrangements: Spouse/significant other Type of Home: House Home Access: Stairs to enter Secretary/administrator of Steps: 2 Entrance Stairs-Rails: None Home Layout: One level Home Equipment: Cane - single point Additional Comments: Patient does not utilize cane for ambulation  Prior Function Level of Independence: Independent Communication Communication: Expressive difficulties Dominant Hand: Right    Cognition  Cognition Arousal/Alertness: Awake/alert Behavior During Therapy: [facility]  for tasks assessed/performed Overall Cognitive Status: Within Functional Limits for tasks assessed    Extremity/Trunk Assessment Upper Extremity Assessment Upper Extremity Assessment: RUE deficits/detail RUE Deficits / Details: general overall weakness through RUE; decreased opposition speed however able to  complete; delayed movements through range of motion  RUE Sensation: decreased proprioception Lower Extremity Assessment Lower Extremity Assessment: Overall WFL for tasks assessed (there was a decrease in proprioception in RLE) Cervical / Trunk Assessment Cervical / Trunk Assessment: Normal   Balance Balance Balance Assessed: Yes (pt became nauseated and we were unable to assess completely) Static Standing Balance Static Standing - Balance Support: No upper extremity supported Static Standing - Level of Assistance: 5: Stand by assistance  End of Session PT - End of Session Equipment Utilized During Treatment: Gait belt Activity Tolerance: Other (comment) (became nauseated) Patient left: in bed  GP     Myrlene Broker L 08/04/2013, 12:13 PM

## 2013-08-04 NOTE — Progress Notes (Signed)
Pt and her family members verbalize understanding of d/c instructions, new meds, and med changes, and necessary follow up appts. Pt is alert and oriented and baseline seems to be improved. Pt is eating well. Pt was d/c with hydrogel for diabetic foot ulcers. Pt has no questions at this time. No IV in place. Pt d/c via wheelchair, accompanied by staff member and pts family. Sheryn Bison

## 2013-08-04 NOTE — Progress Notes (Signed)
*  PRELIMINARY RESULTS* Echocardiogram 2D Echocardiogram has been performed.  Madison Reed 08/04/2013, 11:55 AM

## 2013-08-05 NOTE — Evaluation (Signed)
Occupational Therapy Evaluation Patient Details Name: Madison Reed MRN: [id number] DOB: 11/30/1950 Today's Date: 08/04/2013 Time: 6295-2841 OT Time Calculation (min): 25 min Evaluation 3244-0102  OT Assessment / Plan / Recommendation History of present illness Pt is admitted with a left pontine stroke.  She has a hx of DM, HTN, chronic lumbar pain and neuropathy.  She lives with her husband and had been independent with all ADLs PTA.  She c/o slurred speech, right sided weakness and unsteady gait.  She has not seen much improvement of sx since admission.   Clinical Impression   Patient presents with decreased coordination and increased weakness RUE.  Recommend patient would benefit from skilled OT services.      OT Assessment  Patient needs continued OT Services    Follow Up Recommendations  Outpatient OT    Barriers to Discharge   good family support   Equipment Recommendations          Frequency  Min 3X/week           ADL  Grooming: Minimal assistance Upper Body Dressing: Minimal assistance Lower Body Dressing: Supervision/safety Toilet Transfer: Min guard    OT Diagnosis: Generalized weakness;Other (comment)  OT Problem List: Decreased strength;Impaired balance (sitting and/or standing);Decreased coordination;Impaired UE functional use OT Treatment Interventions: Self-care/ADL training;Therapeutic activities;Therapeutic exercise;Neuromuscular education;Patient/family education   OT Goals(Current goals can be found in the care plan section) Acute Rehab OT Goals Patient Stated Goal: to speak better and use hand better  OT Goal Formulation: With patient/family Time For Goal Achievement: 08/11/13 Potential to Achieve Goals: Good ADL Goals Pt Will Perform Lower Body Dressing: with modified independence;with adaptive equipment Pt Will Transfer to Toilet: with modified independence Pt/caregiver will Perform Home Exercise Program: Increased strength;Right Upper  extremity;With Supervision  Visit Information  History of Present Illness: Pt is admitted with a left pontine stroke.  She has a hx of DM, HTN, chronic lumbar pain and neuropathy.  She lives with her husband and had been independent with all ADLs PTA.  She c/o slurred speech, right sided weakness and unsteady gait.  She has not seen much improvement of sx since admission.       Prior Functioning     Home Living Family/patient expects to be discharged to:: Private residence Living Arrangements: Spouse/significant other Type of Home: House Home Access: Stairs to enter Secretary/administrator of Steps: 2 Entrance Stairs-Rails: None Home Layout: One level Home Equipment: Cane - single point Additional Comments: Patient does not utilize cane for ambulation  Prior Function Level of Independence: Independent Communication Communication: Expressive difficulties Dominant Hand: Right         Vision/Perception Vision - History Baseline Vision: Wears glasses only for reading Vision - Assessment Eye Alignment: Within Functional Limits   Cognition  Cognition Arousal/Alertness: Awake/alert Behavior During Therapy: WFL for tasks assessed/performed Overall Cognitive Status: Within Functional Limits for tasks assessed    Extremity/Trunk Assessment Upper Extremity Assessment Upper Extremity Assessment: RUE deficits/detail RUE Deficits / Details: general overall weakness through RUE; decreased opposition speed however able to complete; delayed movements through range of motion  RUE Sensation: decreased proprioception     Mobility Bed Mobility Bed Mobility: Supine to Sit Supine to Sit: 7: Independent;HOB flat Transfers Sit to Stand: 7: Independent;From bed Stand to Sit: 7: Independent;To bed           End of Session OT - End of Session Equipment Utilized During Treatment: Gait belt Activity Tolerance: Patient tolerated treatment well Patient left: in bed;with call  bell/phone  within reach;with family/visitor present  GO     Velora Mediate, OTR/L  08/04/2013, 1:15 PM

## 2013-08-11 ENCOUNTER — Ambulatory Visit (HOSPITAL_COMMUNITY)
Admission: RE | Admit: 2013-08-11 | Discharge: 2013-08-11 | Disposition: A | Discharge status: Home / Self Care | Payer: BC Managed Care – PPO | Source: Ambulatory Visit | Attending: Internal Medicine | Admitting: Internal Medicine

## 2013-08-11 ENCOUNTER — Ambulatory Visit (HOSPITAL_COMMUNITY)
Admission: RE | Admit: 2013-08-11 | Discharge: 2013-08-11 | Disposition: A | Discharge status: Home / Self Care | Payer: BC Managed Care – PPO | Source: Ambulatory Visit | Attending: Family Medicine | Admitting: Family Medicine

## 2013-08-11 ENCOUNTER — Other Ambulatory Visit (HOSPITAL_COMMUNITY): Payer: Self-pay | Admitting: Family Medicine

## 2013-08-11 DIAGNOSIS — S92909A Unspecified fracture of unspecified foot, initial encounter for closed fracture: Secondary | ICD-10-CM

## 2013-08-11 DIAGNOSIS — I10 Essential (primary) hypertension: Secondary | ICD-10-CM | POA: Insufficient documentation

## 2013-08-11 DIAGNOSIS — R262 Difficulty in walking, not elsewhere classified: Secondary | ICD-10-CM

## 2013-08-11 DIAGNOSIS — S90426A Blister (nonthermal), unspecified lesser toe(s), initial encounter: Secondary | ICD-10-CM

## 2013-08-11 DIAGNOSIS — E1149 Type 2 diabetes mellitus with other diabetic neurological complication: Secondary | ICD-10-CM | POA: Insufficient documentation

## 2013-08-11 DIAGNOSIS — IMO0001 Reserved for inherently not codable concepts without codable children: Secondary | ICD-10-CM | POA: Insufficient documentation

## 2013-08-11 DIAGNOSIS — R2689 Other abnormalities of gait and mobility: Secondary | ICD-10-CM

## 2013-08-11 DIAGNOSIS — M7989 Other specified soft tissue disorders: Secondary | ICD-10-CM | POA: Insufficient documentation

## 2013-08-11 DIAGNOSIS — X58XXXA Exposure to other specified factors, initial encounter: Secondary | ICD-10-CM | POA: Insufficient documentation

## 2013-08-11 DIAGNOSIS — L089 Local infection of the skin and subcutaneous tissue, unspecified: Secondary | ICD-10-CM | POA: Insufficient documentation

## 2013-08-11 DIAGNOSIS — S91109A Unspecified open wound of unspecified toe(s) without damage to nail, initial encounter: Secondary | ICD-10-CM | POA: Insufficient documentation

## 2013-08-11 DIAGNOSIS — E1142 Type 2 diabetes mellitus with diabetic polyneuropathy: Secondary | ICD-10-CM | POA: Insufficient documentation

## 2013-08-11 DIAGNOSIS — E1169 Type 2 diabetes mellitus with other specified complication: Secondary | ICD-10-CM | POA: Insufficient documentation

## 2013-08-11 DIAGNOSIS — L97409 Non-pressure chronic ulcer of unspecified heel and midfoot with unspecified severity: Secondary | ICD-10-CM | POA: Insufficient documentation

## 2013-08-11 DIAGNOSIS — R29898 Other symptoms and signs involving the musculoskeletal system: Secondary | ICD-10-CM

## 2013-08-11 NOTE — Evaluation (Signed)
Physical Therapy Evaluation  Patient Details  Name: Madison Reed MRN: [id number] Date of Birth: April 18, 1951  Today's Date: 08/11/2013 Time: 1030  - 1149  charge:  eval 1130-1115; debridement [phone]              Visit#: 1 of 12  Re-eval: 09/10/13    Authorization: [employer]     Authorization Visit#: 1 of 10   Past Medical History:  Past Medical History  Diagnosis Date  . Diabetes mellitus without complication   . Hypertension   . Neuropathy   . Chronic back pain   . Bone spur   . Wound, open, foot   . Psoriasis   . Abdominal aneurysm   . Kidney stones    Past Surgical History:  Past Surgical History  Procedure Laterality Date  . Vesicovaginal fistula closure w/ tah    . Abdominal hysterectomy    . Bladder suspension      Subjective Symptoms/Limitations Symptoms: Madison Reed had her stroke on 08/03/2013.  She has been referred to outpatient physcial therapy to maximize her functional abiltiy.  Madison Reed states that since her stroke she has difficuty walkiing, (she is using a walker at home and a quad cane outside normally uses no assistive device).  She has two steps to get into her house but needs to use her cane and have assistance on her other side to get up and down.   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  Pt is able to stand x 2 min with 4 loss of balance with ability to self correct How long can you walk comfortably?: Pt has not walked greater than two minutes (into hospital) Pain Assessment Currently in Pain?: No/denies  Balance Screening  no falls  Prior Functioning   I no assistive device      Assessment RLE Strength Right Hip Flexion: 5/5 Right Hip Extension: 5/5 Right Hip ABduction: 3+/5 Right Hip ADduction: 3+/5 Right Knee Flexion: 4/5 Right Knee Extension: 4/5 Right  Ankle Dorsiflexion: 4/5 Right Ankle Plantar Flexion: 5/5  Exercise/Treatments Mobility/Balance  Berg Balance Test Sit to Stand: Able to stand without using hands and stabilize independently Standing Unsupported: Able to stand 30 seconds unsupported Sitting with Back Unsupported but Feet Supported on Floor or Stool: Able to sit safely and securely 2 minutes Stand to Sit: Sits safely with minimal use of hands Transfers: Able to transfer safely, definite need of hands Standing Unsupported with Eyes Closed: Able to stand 10 seconds with supervision Standing Ubsupported with Feet Together: Able to place feet together independently and stand 1 minute safely From Standing, Reach Forward with Outstretched Arm: Can reach confidently >25 cm (10") From Standing Position, Pick up Object from Floor: Able to pick up shoe, needs supervision From Standing Position, Turn to Look Behind Over each Shoulder: Looks behind one side only/other side shows less weight shift Turn 360 Degrees: Able to turn 360 degrees safely in 4 seconds or less Standing Unsupported, Alternately Place Feet on Step/Stool: Able to stand independently and safely and complete 8 steps in 20 seconds Standing Unsupported, One Foot in Front: Able to take small step independently and hold 30 seconds Standing on One Leg: Tries to lift leg/unable to hold 3 seconds but remains standing independently Total Score: 45   Objective:  Wound 1  Location:  Plantar aspect Rt great toe     Length: .7 cm Depth: at least 2.0 cm  Width: .7cm  Granulation:  0 Slough: 100 Drainage (amount/description): moderate serous Undermining:  All aspects varies from .5 to 1.0 cm Periwound: Callous halo around wound  Other:  Callous area on 4th metatarsal head without opening.  Dressing Type: aquacell packed into wound bed followed by 2x2 and 2" kling.  Pt given Darco boot to keep pressure off of wound.  Pt counseled to use walker with Darco boot until her  balance improves. Sharps Used/Location: focep/sissors Tissue Remove: slough  Clinical  Impressions Statement: If wound continues to be foul smelling may use pulse lavage to cleanse are.  Balance Exercises Standing Standing Eyes Closed: Narrow base of support (BOS);Solid surface;3 reps SLS: Eyes open;3 reps Sit to Stand: Standard surface (x 3)     Physical Therapy Assessment and Plan PT Assessment and Plan Clinical Impression Statement: Pt is a 62 yo female who suffered a Lt pontine CVA on 08/03/2013.  The patient comes to the department with main complaint of decreased balance and a wound on her Rt great toe for which she was referred to the wound center for but is unable to get in for three weeks.  Pt was counseled that we do wound care here if she would want [place] to call her MD.  PT agreed. recieved verbal order for wound care and debridement at 10:53 am .  Pt has a foul smelling wound with loss of integrety of soft tissue on her Rt great toe.  Recommended x-ray to R/O any bone involvement.   Pt will benefit from skilled therapeutic intervention in order to improve on the following deficits: Decreased balance;Decreased strength;Other (comment);Decreased skin integrity;Difficulty walking (non-healing wound.) Rehab Potential: Good PT Frequency: Min 3X/week PT Duration: 4 weeks PT Treatment/Interventions: Gait training;Therapeutic activities;Patient/family education;Neuromuscular re-education;Balance training;Other (comment) (wound care) PT Plan: Pt most urgent problem at this time is her wound on her Rt great toe.  We will see pt for both balance and wound care.    May use pulse lavage if wound continues to be foul smelling  Goals Home Exercise Program Pt/caregiver will Perform Home Exercise Program: For increased strengthening;For improved balance PT Short Term Goals Time to Complete Short Term Goals: 2 weeks PT Short Term Goal 1: Pt Berg score to be improved to be improved 5 pts to be  able to ambulate with a cane. PT Short Term Goal 2: pt to be able to come sit to stand without effort in one attempt. PT Short Term Goal 3: Wound undermining to be decreased to .5 cm ; depth decreasd to 2.0 xm PT Short Term Goal 4: wound to be free of foul smell PT Short Term Goal 5: min drainage PT Long Term Goals Time to Complete Long Term Goals: 4 weeks PT Long Term Goal 1: Berg improved to at least 52 to allow ambulation without assistive device. PT Long Term Goal 2: Pt to state she is at her normal cadence of walking. Long Term Goal 3: scant drainage of wound Long Term Goal 4: Pt wound depth to be no greater than 1.0cm no longer undermining PT Long Term Goal 5: Able to go up and down 4 steps I with use of a handrail  Problem List Patient Active Problem List   Diagnosis Date Noted  . Unstable balance 08/11/2013  . Foot and toe(s), blister, infected 08/11/2013  . Difficulty in walking(719.7) 08/11/2013  . Weakness of right  leg 08/11/2013  . Acute ischemic stroke 08/03/2013  . Hypokalemia 08/03/2013  . Diabetes mellitus without complication   . Hypertension   . Neuropathy   . Chronic back pain   . Psoriasis   . Wound, open, foot     PT Plan of Care PT Home Exercise Plan: given for strengthening and balance PT Patient Instructions: no longer put peroxide on wound.  Keep wound covered and only change dressing if noted moisture on outside of dressing.  GP Functional Assessment Tool Used: clinical judgement Functional Limitation: Other PT primary Other PT Primary Current Status (Z6109): At least 80 percent but less than 100 percent impaired, limited or restricted Other PT Primary Goal Status (U0454): At least 20 percent but less than 40 percent impaired, limited or restricted  Tabias Swayze,CINDY 08/11/2013, 12:23 PM  Physician Documentation Your signature is required to indicate approval of the treatment plan as stated above.  Please sign and either send electronically or make a  copy of this report for your files and return this physician signed original.   Please mark one 1.__approve of plan  2. ___approve of plan with the following conditions.   ______________________________                                                          _____________________ Physician Signature                                                                                                             Date

## 2013-08-14 ENCOUNTER — Ambulatory Visit (HOSPITAL_COMMUNITY)
Admission: RE | Admit: 2013-08-14 | Discharge: 2013-08-14 | Disposition: A | Discharge status: Home / Self Care | Payer: BC Managed Care – PPO | Source: Ambulatory Visit | Attending: Family Medicine | Admitting: Family Medicine

## 2013-08-14 DIAGNOSIS — L97409 Non-pressure chronic ulcer of unspecified heel and midfoot with unspecified severity: Secondary | ICD-10-CM | POA: Insufficient documentation

## 2013-08-14 DIAGNOSIS — I10 Essential (primary) hypertension: Secondary | ICD-10-CM | POA: Insufficient documentation

## 2013-08-14 DIAGNOSIS — E1142 Type 2 diabetes mellitus with diabetic polyneuropathy: Secondary | ICD-10-CM | POA: Insufficient documentation

## 2013-08-14 DIAGNOSIS — IMO0001 Reserved for inherently not codable concepts without codable children: Secondary | ICD-10-CM | POA: Insufficient documentation

## 2013-08-14 DIAGNOSIS — E1169 Type 2 diabetes mellitus with other specified complication: Secondary | ICD-10-CM | POA: Insufficient documentation

## 2013-08-14 DIAGNOSIS — R262 Difficulty in walking, not elsewhere classified: Secondary | ICD-10-CM | POA: Insufficient documentation

## 2013-08-14 DIAGNOSIS — E1149 Type 2 diabetes mellitus with other diabetic neurological complication: Secondary | ICD-10-CM | POA: Insufficient documentation

## 2013-08-14 NOTE — Progress Notes (Signed)
Physical Therapy Treatment / Wound care Patient Details  Name: Madison Reed MRN: [id number] Date of Birth: 11/11/1950  Today's Date: 08/14/2013 Time: 1105-1200 PT Time Calculation (min): 55 min Visit#: 2 of 12  Re-eval: 09/10/13 Authorization: [employer]  Charges:  Reece Levy <20cm   Subjective: Symptoms/Limitations Symptoms: Daughter states they have had to change her dressings several times due to alot of drainage and it has been stinking.  Pt reports improvment with wearing Darco boot to decrease weight bearing in forefoot/toes. Pain Assessment Currently in Pain?: No/denies   Exercise/Treatments Unable to complete therex today due to extended time spent on wound care   Wound 1  Location: Plantar aspect Rt great toe  Length: .7 cm (on 08/11/13) Depth: at least 2.0 cm (on 08/11/13) Width: .7cm (on 08/11/13) Granulation: 75%  Slough:  25% Drainage (amount/description): moderate serous  Undermining: All aspects varies from .25 to .50 cm Periwound: Callous Treatment:  Pulsed lavage, selective debridement and dressing change Dressing Type: aquacel packed into wound bed followed by 2x2 and 3" conform.   Sharps Used/Location: forcep/scissors  Tissue Remove: slough and callous  Wound 2  Location: Base of 5th Metatarsal Length: .1.5cm Depth: 1 cm Width: 1.5cm Granulation: 50% Slough:  50% Drainage (amount/description):scant Undermining:none Periwound: Callous Treatment:  Pulsed lavage, selective debridement and dressing change Dressing Type: aquacel placed onto wound wound bed followed by 4x4 and 3" conform.   Sharps Used/Location: forcep/scissors  Tissue Remove: slough and callous      Physical Therapy Assessment and Plan PT Assessment and Plan Clinical Impression Statement: Unable to complete therex today due to extended time spent on woundcare.  Great toe continues to drain moderately with slight odor today.  Also noted with animal hairs in/around wound.  Educated on  keeping clean and dry. Also inspected Lt foot with noted dryness/callous; educated on moisturizing feet.   Began PL today to properly clean due to depth.  Calloused area over base of 5th MT now opened wit thick callous surrounding wound.  Able to debride 90% of surrounding callous to completely expose wound.  Wound itself is 100% granulated with scant drainage.  great toe with alot of callous at perimeter as well.  Able to debride 50% of surrounding callous from great toe.  Pt encouraged to see foot MD for regular foot checks and to get toenails cut since she is diabetic.   Pt instructed to continue HEP and we would resume therex next visit. PT Plan: Continue to see pt for both balance and wound care.  Progress balance exercises next visit.       Problem List Patient Active Problem List   Diagnosis Date Noted  . Unstable balance 08/11/2013  . Foot and toe(s), blister, infected 08/11/2013  . Difficulty in walking(719.7) 08/11/2013  . Weakness of right leg 08/11/2013  . Acute ischemic stroke 08/03/2013  . Hypokalemia 08/03/2013  . Diabetes mellitus without complication   . Hypertension   . Neuropathy   . Chronic back pain   . Psoriasis   . Wound, open, foot       Lurena Nida, PTA/CLT 08/14/2013, 12:24 PM

## 2013-08-16 ENCOUNTER — Ambulatory Visit (HOSPITAL_COMMUNITY)
Admission: RE | Admit: 2013-08-16 | Discharge: 2013-08-16 | Disposition: A | Discharge status: Home / Self Care | Payer: BC Managed Care – PPO | Source: Ambulatory Visit | Attending: Internal Medicine | Admitting: Internal Medicine

## 2013-08-16 DIAGNOSIS — R2689 Other abnormalities of gait and mobility: Secondary | ICD-10-CM

## 2013-08-16 DIAGNOSIS — R29898 Other symptoms and signs involving the musculoskeletal system: Secondary | ICD-10-CM

## 2013-08-16 DIAGNOSIS — R262 Difficulty in walking, not elsewhere classified: Secondary | ICD-10-CM

## 2013-08-16 NOTE — Progress Notes (Signed)
  Patient Details  Name: Madison Reed MRN: [id number] Date of Birth: Mar 04, 1951  Today's Date: 08/16/2013 Time:1108  - 1145 charge debridement   Physical Therapy Wound Care Treatment  Referring physician/Next Apt:  Medical Diagnosis: CVA/ Non healing wound Previous Therapy: acute  Subjective:Pt states there has not been as much drainage this time and has noted decreased smell.  Is patient agreeable to wound care?   Pain: none  Objective:  Wound 1  Location:  Plantar aspect of great toe     Length: .7 on 10/31-wound is bigger now due to debridement of callous Depth: .2cm Width:  .7cm 10/31-wound is bigger now due to debridement of callous Granulation:  98 Slough: 2 Drainage (amount/description): moderate Periwound: callous    Dressing Type: silver hydrofiber packed into wound f/b 4x4, kling and netting to hold in place. Sharps Used/Location: scissors, scapel Tissue Remove: callous    Second wound is almost healed no debridment. Clinical  Impressions Statement:  Wound has no odor now.  Wound appears bigger but this was expected with debridement of callous.  Second wound on MTP head is almost healed.  Plan:  Begin stroke therapy next week with continued wound care.  Re-eval: 09/10/13   Lori-Ann Lindfors,CINDY 08/16/2013, 4:22 PM

## 2013-08-18 ENCOUNTER — Ambulatory Visit (HOSPITAL_COMMUNITY)
Admission: RE | Admit: 2013-08-18 | Discharge: 2013-08-18 | Disposition: A | Discharge status: Home / Self Care | Payer: BC Managed Care – PPO | Source: Ambulatory Visit | Attending: Family Medicine | Admitting: Family Medicine

## 2013-08-18 DIAGNOSIS — R29898 Other symptoms and signs involving the musculoskeletal system: Secondary | ICD-10-CM

## 2013-08-18 DIAGNOSIS — R262 Difficulty in walking, not elsewhere classified: Secondary | ICD-10-CM

## 2013-08-18 DIAGNOSIS — R2689 Other abnormalities of gait and mobility: Secondary | ICD-10-CM

## 2013-08-18 NOTE — Progress Notes (Signed)
Physical Therapy Treatment Patient Details  Name: Madison Reed MRN: [id number] Date of Birth: March 28, 1951  Today's Date: 08/18/2013 Time: 1100-1200 PT Time Calculation (min): 60 min Charge: Selective debridement <20 cm x 30 min, Gait training x 12 1130-1142, NMR 1142-1152, TE 1152-1200  Visit#: 4 of 12  Re-eval: 09/10/13 Assessment Diagnosis: Lt [place] CVA  Authorization: [employer]  Authorization Time Period:    Authorization Visit#: 4 of 10   Subjective: Symptoms/Limitations Symptoms: Pt stated she took shower prior therapy today, reports moderate drainage from wound.  Pt stated foot is sore, ankle/leg pain increase with weight bearing. Pain Assessment Currently in Pain?: Yes Pain Score: 3  Pain Location: Ankle  Precautions/Restrictions  Precautions Precautions: Fall  Exercise/Treatments Wound 1  Location: Plantar aspect Rt great toe  Granulation: 95% Slough: 5% Drainage (amount/description): moderate serous, wound cleansed prior therapy so no current drainage, subjective information with amount of drainage today. Undermining: All aspects varies from .25 to .50 cm  Periwound: Callous  Treatment: Pulsed lavage, selective debridement and dressing change  Dressing Type: aquacel packed into wound bed followed by 2x2 and 3" conform.  Sharps Used/Location: forcep/scissors  Tissue Remove: slough and callous   Balance Exercises Standing Standing Eyes Closed: Narrow base of support (BOS);5 reps;Solid surface;Limitations Standing Eyes Closed Limitations: no HHA Tandem Stance: Eyes open;3 reps;30 secs Sit to Stand: Standard surface;Limitations Sit to Stand Limitations: 5 reps STS no HHA Other Standing Exercises: Gait training with SPC with visual aide; cueing for equalize stride length  Seated Other Seated Exercises: LAQ 10 reps, iso hip add 10x 10"   Physical Therapy Assessment and Plan PT Assessment and Plan Clinical Impression Statement: Wound improving with  overall healing with 95% granulation following PLS.  Continued removal of callouses around periwound of Rt great toe to promote healing, utilized aquacel packed into wound bed followed by 2x2 and 3" conform dressings.  No noted odor this session.  Metatarsal wound completely healed with no treatment required this session.  Began gait training with QC and balance activitiesthis session.  Multimodal cueing for proper sequencing with QC, vc-ing required to equaize stride length and for posture.  Min assistance required with static balance training and cueing to improve spatial awareness.  Pt unsure if she was referred to therapy for speech, OT and PT, needs a follow up on referral.   PT Plan: Possible D/C to PLS as needed.  Continue to see pt for both balance and wound care.  Progress balance exercises next visit.  Discuss with pt needs for speech and OT, send referrral is appropriate.    Goals PT Short Term Goals PT Short Term Goal 1: Pt Berg score to be improved to be improved 5 pts to be able to ambulate with a cane. PT Short Term Goal 2: pt to be able to come sit to stand without effort in one attempt. PT Short Term Goal 2 - Progress: Progressing toward goal PT Short Term Goal 3: Wound undermining to be decreased to .5 cm ; depth decreasd to 2.0 xm PT Short Term Goal 4: wound to be free of foul smell PT Short Term Goal 4 - Progress: Met PT Short Term Goal 5: min drainage PT Long Term Goals Time to Complete Long Term Goals: 4 weeks PT Long Term Goal 1: Berg improved to at least 52 to allow ambulation without assistive device. PT Long Term Goal 2: Pt to state she is at her normal cadence of walking. Long Term Goal 3: scant drainage  of wound Long Term Goal 4: Pt wound depth to be no greater than 1.0cm no longer undermining  Problem List Patient Active Problem List   Diagnosis Date Noted  . Unstable balance 08/11/2013  . Foot and toe(s), blister, infected 08/11/2013  . Difficulty in  walking(719.7) 08/11/2013  . Weakness of right leg 08/11/2013  . Acute ischemic stroke 08/03/2013  . Hypokalemia 08/03/2013  . Diabetes mellitus without complication   . Hypertension   . Neuropathy   . Chronic back pain   . Psoriasis   . Wound, open, foot     PT - End of Session Equipment Utilized During Treatment: Gait belt Activity Tolerance: Patient tolerated treatment well General Behavior During Therapy: [facility] for tasks assessed/performed  GP    Juel Burrow 08/18/2013, 12:40 PM

## 2013-08-21 ENCOUNTER — Ambulatory Visit (HOSPITAL_COMMUNITY): Payer: BC Managed Care – PPO | Admitting: Physical Therapy

## 2013-08-22 ENCOUNTER — Ambulatory Visit (HOSPITAL_COMMUNITY)
Admission: RE | Admit: 2013-08-22 | Discharge: 2013-08-22 | Disposition: A | Discharge status: Home / Self Care | Payer: BC Managed Care – PPO | Source: Ambulatory Visit | Attending: Family Medicine | Admitting: Family Medicine

## 2013-08-22 DIAGNOSIS — R2689 Other abnormalities of gait and mobility: Secondary | ICD-10-CM

## 2013-08-22 DIAGNOSIS — R29898 Other symptoms and signs involving the musculoskeletal system: Secondary | ICD-10-CM

## 2013-08-22 DIAGNOSIS — R262 Difficulty in walking, not elsewhere classified: Secondary | ICD-10-CM

## 2013-08-22 NOTE — Progress Notes (Signed)
Physical Therapy Treatment Patient Details  Name: Madison Reed MRN: [id number] Date of Birth: May 10, 1951  Today's Date: 08/22/2013 Time: 1355 - 1515  debridement 1355-1445; neuro 1445-155  Visit#: 5 of 12  Re-eval: 09/10/13    Authorization: [employer]    Authorization Visit#: 5 of 10   Subjective: Symptoms/Limitations Symptoms: Pt states she is walking better and practicing her exercises. Pain Assessment Currently in Pain?: No/denies        Exercise/Treatments  Wound 1  Location: Plantar aspect Rt great toe  Granulation: 98%  Slough: 3%  Drainage (amount/description): moderate serous, wound cleansed prior therapy so no current drainage, subjective information with amount of drainage today.  Undermining: All aspects varies from .25 to .50 cm  Periwound: Callous  Dressing Type: aquacel packed into wound bed followed by Ab pad and kerlix Sharps Used/Location: forcep/scissors  Tissue Remove: slough and callous  Balance Exercises  Standing  Standing Eyes Closed: Narrow base of support (BOS);5 reps;foam surface;Limitations  Standing Eyes Closed Limitations:on foam; cone rotation on foam; squats on foam Tandem Stance: Eyes open;3 reps;30 secs  Sit to Stand: Standard surface;Limitations  Sit to Stand Limitations: 10 reps STS no HHA; rapid; 10 slow  Tandem gt and retro gt x 2 RT Physical Therapy Assessment and Plan  PT Assessment and Plan  Clinical Impression Statement: Wound improving with overall healing . Continued removal of callouses around periwound of Rt great toe to promote healing and PT, needs a follow up on referral.   Plan D/C pulse lavage; continue to progress balance activity ie balance beam      Problem List Patient Active Problem List   Diagnosis Date Noted  . Unstable balance 08/11/2013  . Foot and toe(s), blister, infected 08/11/2013  . Difficulty in walking(719.7) 08/11/2013  . Weakness of right leg 08/11/2013  . Acute ischemic stroke 08/03/2013   . Hypokalemia 08/03/2013  . Diabetes mellitus without complication   . Hypertension   . Neuropathy   . Chronic back pain   . Psoriasis   . Wound, open, foot        GP    RUSSELL,CINDY 08/22/2013, 3:44 PM

## 2013-08-23 ENCOUNTER — Ambulatory Visit (HOSPITAL_COMMUNITY): Payer: BC Managed Care – PPO | Admitting: Physical Therapy

## 2013-08-24 ENCOUNTER — Ambulatory Visit (HOSPITAL_COMMUNITY)
Admission: RE | Admit: 2013-08-24 | Discharge: 2013-08-24 | Disposition: A | Discharge status: Home / Self Care | Payer: BC Managed Care – PPO | Source: Ambulatory Visit | Attending: Internal Medicine | Admitting: Internal Medicine

## 2013-08-24 DIAGNOSIS — R29898 Other symptoms and signs involving the musculoskeletal system: Secondary | ICD-10-CM

## 2013-08-24 DIAGNOSIS — R262 Difficulty in walking, not elsewhere classified: Secondary | ICD-10-CM

## 2013-08-24 DIAGNOSIS — R2689 Other abnormalities of gait and mobility: Secondary | ICD-10-CM

## 2013-08-24 NOTE — Progress Notes (Signed)
Physical Therapy Treatment Patient Details  Name: Madison Reed MRN: [id number] Date of Birth: November 20, 1950  Today's Date: 08/24/2013 Time: 1015-1145 PT Time Calculation (min): 90 min Charge: debridement  1015-1105; neuro-reed 4098-1191 Visit#: 6 of 12  Re-eval: 09/10/13   Authorization: [employer]  Authorization Visit#: 6 of 10   Subjective: Symptoms/Limitations Symptoms: Pt states she is trying to go without her cane    Exercise/Treatments Wound 1  Location: Plantar aspect Rt great toe  Granulation: 99%  Slough: 1%  Drainage (amount/description): minimal serous, wound cleansed prior therapy so no current drainage, subjective information with amount of drainage today.  Undermining: All aspects varies from .25 to .50 cm  Periwound: Callous  Dressing Type: aquacel packed into wound bed followed by Ab pad and kerlix  Sharps Used/Location: forcep/scissors/ scapel Tissue Remove: slough and callous      Balance Exercises Standing Standing Eyes Closed Limitations:  (Rt foot on 8" step; switch Lt foot on 8" step) Tandem Stance: Eyes open;3 reps;30 secs Standing, One Foot on a Step: Eyes open;8 inch;3 reps;30 secs Wall Bumps: Shoulder;Hips;Eyes opened;10 reps Tandem Gait: Forward;Retro;2 reps;Limitations Tandem Gait Limitations: balance beam Numbers 1-15: Foam;1 rep Marching: Solid surface;10 reps Sit to Stand: Standard surface;Limitations Sit to Stand Limitations: 5 reps STS no HHA Other Standing Exercises: Gait train no assistive device.     Seated Other Seated Exercises: Nustep L4 hills x 8:00       Physical Therapy Assessment and Plan PT Assessment and Plan Clinical Impression Statement: Pt had almost all callous removed today; Pt wound depth is increasing.   PT Treatment/Interventions: Gait training;Therapeutic activities;Patient/family education;Neuromuscular re-education;Balance training;Other (comment) PT Plan: begin stepping over cones.      Problem  List Patient Active Problem List   Diagnosis Date Noted  . Unstable balance 08/11/2013  . Foot and toe(s), blister, infected 08/11/2013  . Difficulty in walking(719.7) 08/11/2013  . Weakness of right leg 08/11/2013  . Acute ischemic stroke 08/03/2013  . Hypokalemia 08/03/2013  . Diabetes mellitus without complication   . Hypertension   . Neuropathy   . Chronic back pain   . Psoriasis   . Wound, open, foot     PT - End of Session Activity Tolerance: Patient tolerated treatment well  GP    Pavel Gadd,CINDY 08/24/2013, 11:59 AM

## 2013-08-25 ENCOUNTER — Ambulatory Visit (HOSPITAL_COMMUNITY): Payer: BC Managed Care – PPO

## 2013-08-28 ENCOUNTER — Ambulatory Visit (HOSPITAL_COMMUNITY)
Admission: RE | Admit: 2013-08-28 | Discharge: 2013-08-28 | Disposition: A | Discharge status: Home / Self Care | Payer: BC Managed Care – PPO | Source: Ambulatory Visit | Attending: Family Medicine | Admitting: Family Medicine

## 2013-08-28 NOTE — Progress Notes (Signed)
Physical Therapy Treatment Patient Details  Name: Madison Reed MRN: [id number] Date of Birth: 1951/09/27  Today's Date: 08/28/2013 Time: 0930-1045 PT Time Calculation (min): 75 min Visit#: 7 of 12  Re-eval: 09/10/13 Authorization: [employer]  Authorization Visit#: 7 of 10  Charges:  Deb <20cm [phone], therex 1002-1016 (14'), NMR 1017-1034 (14'), gait 1035-1045 (10')  Subjective: Symptoms/Limitations Symptoms: Pt states she is working more on her gait and balance at home without AD.  States overall improvement.  Pt questioning if referral was received for speech and occupational therapy.   Pain Assessment Currently in Pain?: No/denies   Exercise/Treatments Balance Exercises Standing Tandem Stance: Eyes open;3 reps;30 secs SLS: Eyes open;3 reps;Time SLS Time: R: 15" max, Lt"  15" max without UE's Tandem Gait: Forward;2 reps;Limitations Tandem Gait Limitations: balance beam Turning: Limitations Turning Limitations: pivot turns with gait Step Over Hurdles / Cones: 2 RT varried height reciprocally Marching: Limitations Marching Limitations: toe taps on 6" step 10 reps no UE's Sit to Stand: Standard surface;Limitations Sit to Stand Limitations: 5 reps STS no HHA Other Standing Exercises: Gait train no assistive device, faster gait with turn pivots to challenge stability. Other Standing Exercises: outdoor gait next visit  Seated Other Seated Exercises: Nustep L4 hills x 10:00       Wound 1  Location: Plantar aspect Rt great toe  Granulation: 99%  Slough: 1%  Drainage (amount/description): minimal serous, wound cleansed prior therapy so no current drainage  Undermining: All aspects varies from .25 to .50 cm  Periwound: Callous  Dressing Type: aquacel packed into wound bed followed by 2X2, 3" conform and #5 netting  Sharps Used/Location: forcep/scissors/ scapel  Tissue Remove: slough and callous   Physical Therapy Assessment and Plan PT Assessment and Plan Clinical  Impression Statement: Wound continues to improve with decreasing depth and drainage.  Callous continues to need debridement from perimeter of wound to encourage approximation.  Added hurdle steps, SLS  and toe tapping to POC.  PT requires cues to push more with LE's with sit to stand with less forward flexion. PT Plan: continue to progress balance.  Outdoor ambulation next visit without AD,  weather permitting.  Begin reciprocal stairs, lunges, squats, lateral gait with theraband.      Problem List Patient Active Problem List   Diagnosis Date Noted  . Unstable balance 08/11/2013  . Foot and toe(s), blister, infected 08/11/2013  . Difficulty in walking(719.7) 08/11/2013  . Weakness of right leg 08/11/2013  . Acute ischemic stroke 08/03/2013  . Hypokalemia 08/03/2013  . Diabetes mellitus without complication   . Hypertension   . Neuropathy   . Chronic back pain   . Psoriasis   . Wound, open, foot     PT - End of Session Activity Tolerance: Patient tolerated treatment well   Lurena Nida, PTA/CLT 08/28/2013, 11:41 AM

## 2013-08-30 ENCOUNTER — Ambulatory Visit (HOSPITAL_COMMUNITY): Payer: BC Managed Care – PPO | Admitting: Physical Therapy

## 2013-09-01 ENCOUNTER — Ambulatory Visit (HOSPITAL_COMMUNITY)
Admission: RE | Admit: 2013-09-01 | Discharge: 2013-09-01 | Disposition: A | Discharge status: Home / Self Care | Payer: BC Managed Care – PPO | Source: Ambulatory Visit | Attending: Family Medicine | Admitting: Family Medicine

## 2013-09-01 DIAGNOSIS — R29898 Other symptoms and signs involving the musculoskeletal system: Secondary | ICD-10-CM

## 2013-09-01 DIAGNOSIS — R262 Difficulty in walking, not elsewhere classified: Secondary | ICD-10-CM

## 2013-09-01 DIAGNOSIS — R2689 Other abnormalities of gait and mobility: Secondary | ICD-10-CM

## 2013-09-01 NOTE — Progress Notes (Signed)
Physical Therapy Treatment Patient Details  Name: Madison Reed MRN: [id number] Date of Birth: January 16, 1951  Today's Date: 09/01/2013 Time: 0930-1100 PT Time Calculation (min): 90 min  Visit#: 8 of 12  Re-eval: 09/10/13   charge debridement:  9:35-10:20; neuromuscular reed; 1025-10:50; there  Ex 10:50-11:00  Authorization: [employer] Visit#: 8 of 10   Subjective: Symptoms/Limitations Symptoms: I am doing my exercises at home I can tell that my balance is getting better  Exercise/Treatments  Balance Exercises Standing Tandem Stance: Eyes open;3 reps;30 secs SLS: Eyes open;3 reps;Time SLS Time: R: 15" max, Lt"  15" max without UE's Tandem Gait: Forward;Retro;2 reps;Limitations Tandem Gait Limitations: balance beam Step Over Hurdles / Cones: 2 RT varried height reciprocally Sit to Stand: Standard surface;Limitations Sit to Stand Limitations: 10 reps STS no HHA Other Standing Exercises: Gait train no assistive device, faster gait with turn pivots to challenge stability.   Seated Other Seated Exercises: Nustep L4 hills x 10:00      Physical Therapy Assessment and Plan PT Assessment and Plan Clinical Impression Statement: Pt had callous formatio along 4th MTP jt which was debrided 70%; Pt wound is closing in but out side enterance is closing faster than the wound is healing in the inside will need to keep wound open until there is no more undermining occuring.  Pt showing great gains in balance with therapist facilitated activite. PT Plan: continue to progress balance.  Outdoor ambulation next visit without AD,  weather permitting.  Begin reciprocal stairs, lunges, squats, lateral gait with theraband.    Yoga  Objective:  Wound 1  Location:  Great toe/ callous at 4th MTP head      Granulation:  99% Slough: 1% Drainage (amount/description): serous Periwound: calloused Other:  Wound has significant undermining  Dressing Type: aquacell; 4x4 and 3" kling Sharps  Used/Location: forcep, scissors, scapel. Tissue Remove: slough and callous   Problem List Patient Active Problem List   Diagnosis Date Noted  . Unstable balance 08/11/2013  . Foot and toe(s), blister, infected 08/11/2013  . Difficulty in walking(719.7) 08/11/2013  . Weakness of right leg 08/11/2013  . Acute ischemic stroke 08/03/2013  . Hypokalemia 08/03/2013  . Diabetes mellitus without complication   . Hypertension   . Neuropathy   . Chronic back pain   . Psoriasis   . Wound, open, foot        GP Functional Assessment Tool Used: clinical judgement   Misaki Sozio,CINDY 09/01/2013, 12:18 PM

## 2013-09-04 ENCOUNTER — Ambulatory Visit (HOSPITAL_COMMUNITY)
Admission: RE | Admit: 2013-09-04 | Discharge: 2013-09-04 | Disposition: A | Discharge status: Home / Self Care | Payer: BC Managed Care – PPO | Source: Ambulatory Visit | Attending: Internal Medicine | Admitting: Internal Medicine

## 2013-09-04 DIAGNOSIS — R29898 Other symptoms and signs involving the musculoskeletal system: Secondary | ICD-10-CM

## 2013-09-04 DIAGNOSIS — R262 Difficulty in walking, not elsewhere classified: Secondary | ICD-10-CM

## 2013-09-04 DIAGNOSIS — R2689 Other abnormalities of gait and mobility: Secondary | ICD-10-CM

## 2013-09-04 NOTE — Evaluation (Addendum)
Physical Therapy Evaluation  Patient Details  Name: Madison Reed MRN: [id number] Date of Birth: 11/24/1950  Today's Date: 09/04/2013 Time: 1020-1145 PT Time Calculation (min): 85 min Charge: debridement 10:20-11:00; Gt 11:00-11:14: neuro re-ed 11:14-1128; physical performance 11:29-11:39; mm test 11:39-1145             Visit#: 9 of 17  Re-eval: 10/04/13 Assessment Diagnosis: Lt [place] CVA  Authorization: [employer]    Authorization Visit#: 9 of 17   Past Medical History:  Past Medical History  Diagnosis Date  . Diabetes mellitus without complication   . Hypertension   . Neuropathy   . Chronic back pain   . Bone spur   . Wound, open, foot   . Psoriasis   . Abdominal aneurysm   . Kidney stones    Past Surgical History:  Past Surgical History  Procedure Laterality Date  . Vesicovaginal fistula closure w/ tah    . Abdominal hysterectomy    . 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  Pt is able to stand x 2 min with 4 loss of balance with ability to self correct How long can you walk comfortably?: Pt has not walked greater than two minutes (into hospital) Pain Assessment Currently in Pain?: No/denies  Precautions/Restrictions  Precautions Precautions: Fall   Assessment RLE Strength Right Hip Flexion: 5/5 Right Hip Extension: 5/5 Right Hip ABduction:  (4+/5 was 3+/5) Right Hip ADduction: 5/5 (was 3+/5) Right Knee Flexion: 4/5 (was 4/5) Right Knee Extension: 5/5 (was 5/5) Right Ankle Dorsiflexion: 5/5 (was 4/5) Right Ankle Plantar Flexion: 5/5  Exercise/Treatments Mobility/Balance  Berg Balance Test Sit to Stand: Able to stand without using hands and stabilize independently Standing  Unsupported: Able to stand safely 2 minutes Sitting with Back Unsupported but Feet Supported on Floor or Stool: Able to sit safely and securely 2 minutes Stand to Sit: Sits safely with minimal use of hands Transfers: Able to transfer safely, definite need of hands Standing Unsupported with Eyes Closed: Able to stand 10 seconds safely Standing Ubsupported with Feet Together: Able to place feet together independently and stand 1 minute safely From Standing, Reach Forward with Outstretched Arm: Can reach confidently >25 cm (10") From Standing Position, Pick up Object from Floor: Able to pick up shoe safely and easily From Standing Position, Turn to Look Behind Over each Shoulder: Looks behind from both sides and weight shifts well Turn 360 Degrees: Able to turn 360 degrees safely in 4 seconds or less Standing Unsupported, Alternately Place Feet on Step/Stool: Able to stand independently and safely and complete 8 steps in 20 seconds Standing Unsupported, One Foot in Front: Able to place foot tandem independently and hold 30 seconds Standing on One Leg: Able to lift leg independently and hold > 10 seconds Total Score: 55 was 45  Balance Exercises Standing Standing Eyes Closed: Narrow base of support (BOS);2 reps;20 secs Tandem Stance: Eyes open;2 reps;Cognitive challenge;Limitations Tandem Stance Limitations: as well as turning head , raising UE up and out SLS: Eyes open;3 reps Numbers 1-15: Static;2 reps;Limitations Numbers 1-15 Limitations: tandem  Sit to Stand: Standard surface Sit to Stand Limitations: 10 Other Standing Exercises: Gt train outdoor  no assistive device for inclines; steps,  Wound care completed with debridement of slough.  Wound packed with aquacell, 4x4 and kling.  wound continues to have significant undermining and depth though opening is closing.  Therapist will need to make sure wound does not close prior to the wound being completely healed.   Physical Therapy  Assessment and Plan PT Assessment and Plan Clinical Impression Statement: Pt reevaluated for balance and strength.  Pt  is no longer in need of skilled therapy for balance and strength pt given HEP for remaning deficiets. Pt wound is much cleaner but was not measured this session. Will need to continue wound therapy due to underimining of wounds. Pt will benefit from skilled therapeutic intervention in order to improve on the following deficits:  (non-healing wound) Rehab Potential: Good PT Frequency: Min 2X/week (Pt to change from three times to two times a week for four more weeks. ) PT Treatment/Interventions: Patient/family education (debridement ) PT Plan: continue for wound care only Please remeasure wound     Goals PT Short Term Goals PT Short Term Goal 1: Pt Berg score to be improved to be improved 5 pts to be able to ambulate with a cane. PT Short Term Goal 1 - Progress: Met PT Short Term Goal 2: pt to be able to come sit to stand without effort in one attempt. PT Short Term Goal 3: Wound undermining to be decreased to .5 cm ; depth decreasd to 2.0 xm PT Short Term Goal 3 - Progress: Progressing toward goal PT Short Term Goal 4: wound to be free of foul smell PT Short Term Goal 4 - Progress: Met PT Long Term Goals PT Long Term Goal 1: Berg improved to at least 52 to allow ambulation without assistive device. PT Long Term Goal 1 - Progress: Met PT Long Term Goal 2: Pt to state she is at her normal cadence of walking. PT Long Term Goal 2 - Progress: Progressing toward goal Long Term Goal 3: scant drainage of wound Long Term Goal 4 Progress: Progressing toward goal  Problem List Patient Active Problem List   Diagnosis Date Noted  . Unstable balance 08/11/2013  . Foot and toe(s), blister, infected 08/11/2013  . Difficulty in walking(719.7) 08/11/2013  . Weakness of right leg 08/11/2013  . Acute ischemic stroke 08/03/2013  . Hypokalemia 08/03/2013  . Diabetes mellitus without  complication   . Hypertension   . Neuropathy   . Chronic back pain   . Psoriasis   . Wound, open, foot     PT - End of Session Equipment Utilized During Treatment: Gait belt  GP Functional Assessment Tool Used: clinical judgement  Other PT Primary Current Status [phone]): At least 40 percent but less than 60 percent impaired, limited or restricted Other PT Primary Goal Status (U0454): At least 20 percent but less than 40 percent impaired, limited or restricted  RUSSELL,CINDY 09/04/2013, 12:05 PM  Physician Documentation Your signature is required to indicate approval of the treatment plan as stated above.  Please sign and either send electronically or make a copy of this report for your files and return this physician signed original.   Please mark one 1.__approve of plan  2. ___approve of plan with the following conditions.   ______________________________  _____________________ Physician Signature                                                                                                             Date

## 2013-09-06 ENCOUNTER — Ambulatory Visit (HOSPITAL_COMMUNITY)
Admission: RE | Admit: 2013-09-06 | Discharge: 2013-09-06 | Disposition: A | Discharge status: Home / Self Care | Payer: BC Managed Care – PPO | Source: Ambulatory Visit | Attending: Family Medicine | Admitting: Family Medicine

## 2013-09-06 DIAGNOSIS — R29898 Other symptoms and signs involving the musculoskeletal system: Secondary | ICD-10-CM

## 2013-09-06 DIAGNOSIS — R262 Difficulty in walking, not elsewhere classified: Secondary | ICD-10-CM

## 2013-09-06 DIAGNOSIS — R2689 Other abnormalities of gait and mobility: Secondary | ICD-10-CM

## 2013-09-06 NOTE — Progress Notes (Signed)
Physical Therapy - Wound Therapy  Treatment   Patient Details  Name: Madison Reed MRN: [id number] Date of Birth: [birth year]  Today's Date: 09/06/2013 Time: 1025-1050 Time Calculation (min): 25 min Charge: selective debridement <20 cm  Visit#: 10 of 17  Re-eval: 10/04/13  Subjective Subjective Assessment Subjective: Currently pain free, does have intermittent pain sensations, sharp pain.  Pain Assessment Pain Assessment Pain Assessment: No/denies pain  Wound Therapy Objective:  Wound 1  Location: Great toe/ callous at 4th MTP head  Width:  0.2 cm Length: 0.2 cm Depth 0.5 cm Undermining: surrounding approximately from 0.1-.0.3 cm Granulation: 99%  Slough: 1%  Drainage (amount/description): serous  Periwound: calloused  Other: Wound has significant undermining  Dressing Type: aquacell; 4x4 and 3" kling  Sharps Used/Location: forcep, scissors, scapel.  Tissue Remove: slough and callous            Physical Therapy Assessment and Plan Wound Therapy - Assess/Plan/Recommendations Wound Therapy - Clinical Statement: Wound improving in overall size, decrease slough and increased granuilation.  Debridement complete to remove slough and callouses to promote healing. Continued with aquacel, 4x4 and gauze dressings.        Goals    Problem List Patient Active Problem List   Diagnosis Date Noted  . Unstable balance 08/11/2013  . Foot and toe(s), blister, infected 08/11/2013  . Difficulty in walking(719.7) 08/11/2013  . Weakness of right leg 08/11/2013  . Acute ischemic stroke 08/03/2013  . Hypokalemia 08/03/2013  . Diabetes mellitus without complication   . Hypertension   . Neuropathy   . Chronic back pain   . Psoriasis   . Wound, open, foot     GP Functional Assessment Tool Used: clinical judgement   Juel Burrow 09/06/2013, 11:07 AM

## 2013-09-13 ENCOUNTER — Ambulatory Visit (HOSPITAL_COMMUNITY)
Admission: RE | Admit: 2013-09-13 | Discharge: 2013-09-13 | Disposition: A | Discharge status: Home / Self Care | Payer: BC Managed Care – PPO | Source: Ambulatory Visit | Attending: Family Medicine | Admitting: Family Medicine

## 2013-09-13 DIAGNOSIS — E1142 Type 2 diabetes mellitus with diabetic polyneuropathy: Secondary | ICD-10-CM | POA: Insufficient documentation

## 2013-09-13 DIAGNOSIS — L97409 Non-pressure chronic ulcer of unspecified heel and midfoot with unspecified severity: Secondary | ICD-10-CM | POA: Insufficient documentation

## 2013-09-13 DIAGNOSIS — E1169 Type 2 diabetes mellitus with other specified complication: Secondary | ICD-10-CM | POA: Insufficient documentation

## 2013-09-13 DIAGNOSIS — IMO0001 Reserved for inherently not codable concepts without codable children: Secondary | ICD-10-CM | POA: Insufficient documentation

## 2013-09-13 DIAGNOSIS — E1149 Type 2 diabetes mellitus with other diabetic neurological complication: Secondary | ICD-10-CM | POA: Insufficient documentation

## 2013-09-13 DIAGNOSIS — R262 Difficulty in walking, not elsewhere classified: Secondary | ICD-10-CM | POA: Insufficient documentation

## 2013-09-13 DIAGNOSIS — I10 Essential (primary) hypertension: Secondary | ICD-10-CM | POA: Insufficient documentation

## 2013-09-13 NOTE — Progress Notes (Signed)
Physical Therapy Treatment Patient Details  Name: Madison Reed MRN: [id number] Date of Birth: 10/12/1951  Today's Date: 09/13/2013 Time: 0935-1010 PT Time Calculation (min): 35 min Charges: Selective debridement (= or < 20 cm)   Visit#: 11 of 17  Re-eval: 10/04/13  Authorization: [employer]  Authorization Visit#: 11 of 17   Subjective: Symptoms/Limitations Symptoms: Pt states that she changed her bandage yesterday to take a shower. She reports that her great toe was swollen when she removed bandage. Pain Assessment Currently in Pain?: No/denies  Wound Therapy Objective:  Wound 1  Location: Great toe/ callous at 4th MTP head  Width: 0.2 cm (measured 09/06/13) Length: 0.2 cm (measured 09/06/13) Depth 0.5 cm (measured 09/06/13) Undermining: surrounding approximately from 0.1-.0.3 cm (measured 09/06/13) Granulation: 100% after debridement Slough: 0% after debridement Drainage (amount/description): serous  Periwound: calloused  Other: Wound has significant undermining  Dressing Type: aquacell; 4x4 and 3" kling  Sharps Used/Location: forcep, scapel.  Tissue Remove: callous    Physical Therapy Assessment and Plan PT Assessment and Plan Clinical Impression Statement: Wound continues to progress well. Wound appears healthy and is without signs or sx of infection. Pt tolerates debridement well. Wound appears to be 100% granulated. Good amount of callous removed at wound perimeter and at fourth metatarsal head. Continued with silver alginate packing. and kling wrap. PT Plan: Continue wound care per PT POC.    Problem List Patient Active Problem List   Diagnosis Date Noted  . Unstable balance 08/11/2013  . Foot and toe(s), blister, infected 08/11/2013  . Difficulty in walking(719.7) 08/11/2013  . Weakness of right leg 08/11/2013  . Acute ischemic stroke 08/03/2013  . Hypokalemia 08/03/2013  . Diabetes mellitus without complication   . Hypertension   . Neuropathy   .  Chronic back pain   . Psoriasis   . Wound, open, foot     PT - End of Session Activity Tolerance: Patient tolerated treatment well General Behavior During Therapy: [facility] for tasks assessed/performed  Seth Bake, PTA  09/13/2013, 11:19 AM

## 2013-09-15 ENCOUNTER — Ambulatory Visit (HOSPITAL_COMMUNITY)
Admission: RE | Admit: 2013-09-15 | Discharge: 2013-09-15 | Disposition: A | Discharge status: Home / Self Care | Payer: BC Managed Care – PPO | Source: Ambulatory Visit | Attending: Family Medicine | Admitting: Family Medicine

## 2013-09-15 NOTE — Progress Notes (Signed)
Physical Therapy Treatment Patient Details  Name: Madison Reed MRN: [id number] Date of Birth: [birth year]  Today's Date: 09/15/2013 Time: 0930-1010 PT Time Calculation (min): 40 min Charge Debridement [phone] Visit#: 12 of 17  Re-eval: 10/04/13    Authorization: [employer]    Authorization Visit#: 12 of 17   Subjective: Symptoms/Limitations Symptoms: Pt states that her foot was extremely sore after last treatment. How long can you stand comfortably?: pt able to stand without problem How long can you walk comfortably?: Pt waliking at home without cane  Physical Therapy Wound Care Treatment Objective:  Wound 1  Location:  LT great toe     Length:   Approximately 1cm Depth:   Approximately 1 cm Width:   Approximately 1cm Granulation:  90% Slough: 10% Drainage (amount/description): min-mod Periwound: calloused Other:  Significant undermining from 1 to 1.5 cm entire circumference of wound .  Dressing Type: aquacell/ 4x4 , kling and netting Sharps Used/Location: forceps, scissors Tissue Remove: callous/ slough   Physical Therapy Assessment and Plan PT Assessment and Plan Clinical Impression Statement: Pt toe with increased redness today.  Pt continues to have significant undermining of wound.  Moderate callous removed with debridement. PT Plan: Continue wound care per PT POC.    Goals  progessing  Problem List Patient Active Problem List   Diagnosis Date Noted  . Unstable balance 08/11/2013  . Foot and toe(s), blister, infected 08/11/2013  . Difficulty in walking(719.7) 08/11/2013  . Weakness of right leg 08/11/2013  . Acute ischemic stroke 08/03/2013  . Hypokalemia 08/03/2013  . Diabetes mellitus without complication   . Hypertension   . Neuropathy   . Chronic back pain   . Psoriasis   . Wound, open, foot        GP    Radiah Lubinski,CINDY 09/15/2013, 10:38 AM

## 2013-09-18 ENCOUNTER — Ambulatory Visit (HOSPITAL_COMMUNITY)
Admission: RE | Admit: 2013-09-18 | Discharge: 2013-09-18 | Disposition: A | Discharge status: Home / Self Care | Payer: BC Managed Care – PPO | Source: Ambulatory Visit | Attending: Family Medicine | Admitting: Family Medicine

## 2013-09-18 DIAGNOSIS — R29898 Other symptoms and signs involving the musculoskeletal system: Secondary | ICD-10-CM | POA: Insufficient documentation

## 2013-09-18 NOTE — Evaluation (Signed)
Speech Language Pathology Evaluation Patient Details  Name: Madison Reed MRN: [id number] Date of Birth: 22-Jul-1951  Today's Date: 09/18/2013 Time: 1191-4782 SLP Time Calculation (min): 40 min  Authorization: [employer]  Authorization Time Period: 09/18/2013-10/17/2013  Authorization Visit#: Authorization - Visit Number: 1 of 1   Past Medical History:  Past Medical History  Diagnosis Date  . Diabetes mellitus without complication   . Hypertension   . Neuropathy   . Chronic back pain   . Bone spur   . Wound, open, foot   . Psoriasis   . Abdominal aneurysm   . Kidney stones    Past Surgical History:  Past Surgical History  Procedure Laterality Date  . Vesicovaginal fistula closure w/ tah    . Abdominal hysterectomy    . Bladder suspension     HPI:  HPI: 62 yo female who was admitted to [facility] in October 2014 for stroke (acute nonhemorrhagic left paramedian and brainstem infarct involving the upper pons and lower midbrain. Atrophy and extensive white matter disease bilaterally. This is markedly advanced for age, most likely reflecting the sequelae of chronic microvascular ischemia). She has been receiving physical therapy and wound therapy since her discharge from acute setting.   Symptoms/Limitations Symptoms: "Sometimes my speech is a little slower and slurred when I am tired." Special Tests: informal cog/ling measures Pain Assessment Currently in Pain?: No/denies  Prior Functional Status  Cognitive/Linguistic Baseline: Within functional limits Type of Home: House  Lives With: Spouse Available Help at Discharge: Family Vocation: Unemployed (attempting to obtain disability)  Balance Screening Balance Screen Has the patient fallen in the past 6 months: Yes How many times?: 1 Has the patient had a decrease in activity level because of a fear of falling? : No Is the patient reluctant to leave their home because of a fear of falling? : No  Cognition  Overall Cognitive Status:  Within Functional Limits for tasks assessed Arousal/Alertness: Awake/alert Orientation Level: Oriented X4 Memory: Appears intact Awareness: Appears intact Problem Solving: Appears intact Safety/Judgment: Appears intact  Comprehension  Auditory Comprehension Overall Auditory Comprehension: Appears within functional limits for tasks assessed Yes/No Questions: Within Functional Limits Commands: Within Functional Limits Conversation: Complex Visual Recognition/Discrimination Discrimination: Within Function Limits Reading Comprehension Reading Status: Not tested  Expression  Expression Primary Mode of Expression: Verbal Verbal Expression Overall Verbal Expression: Appears within functional limits for tasks assessed Initiation: No impairment Level of Generative/Spontaneous Verbalization: Conversation Repetition: No impairment Naming: No impairment Pragmatics: No impairment Non-Verbal Means of Communication: Not applicable Written Expression Dominant Hand: Right Written Expression: Not tested  Oral/Motor  Oral Motor/Sensory Function Overall Oral Motor/Sensory Function: Appears within functional limits for tasks assessed Motor Speech Overall Motor Speech: Appears within functional limits for tasks assessed  SLP Goals   N/A  Assessment/Plan  Patient Active Problem List   Diagnosis Date Noted  . Unstable balance 08/11/2013  . Foot and toe(s), blister, infected 08/11/2013  . Difficulty in walking(719.7) 08/11/2013  . Weakness of right leg 08/11/2013  . Acute ischemic stroke 08/03/2013  . Hypokalemia 08/03/2013  . Diabetes mellitus without complication   . Hypertension   . Neuropathy   . Chronic back pain   . Psoriasis   . Wound, open, foot    SLP - End of Session Activity Tolerance: Patient tolerated treatment well General Behavior During Therapy: WFL for tasks assessed/performed  SLP Assessment/Plan Clinical Impression Statement: Pt currently functioning at  baseline in regards to cognitive linguistic measures. Pt reports mild occasional slurred speech when  she is talking quickly or when tired. Pt also reports occasional difficulty swallowing cornbread and compensates by slowing down. Daughter present for evaluation and in agreement with findings. No further SLP services indicated at this time.      Thank you,  Havery Moros, CCC-SLP [phone]  Terryon Pineiro 09/18/2013, 2:39 PM  Physician Documentation Your signature is required to indicate approval of the treatment plan as stated above.  Please sign and either send electronically or make a copy of this report for your files and return this physician signed original.  Please mark one 1.__approve of plan  2. ___approve of plan with the following conditions.   ______________________________                                                          _____________________ Physician Signature                                                                                                             Date

## 2013-09-18 NOTE — Progress Notes (Signed)
Physical Therapy Treatment Patient Details  Name: Madison Reed MRN: [id number] Date of Birth: 12/01/1950  Today's Date: 09/18/2013 Time: 1435-1455 PT Time Calculation (min): 20 min  Visit#: 13 of 17  Re-eval: 10/04/13 Authorization: [employer]  Authorization Visit#: 13 of 17  Charges: Selective debridement (= or < 20 cm)   Subjective:  Pt states it continues to drain and was bloody drainage last time.  States she only takes a shower the day of her appt.  All other days she sponge bathes. Pain Assessment Currently in Pain?: No/denies   Wound Therapy  Objective:  Wound 1  Location: Great toe/ callous at 4th MTP head  Width: 0.2 cm (measured 09/06/13)  Length: 0.2 cm (measured 09/06/13)  Depth 0.5 cm (measured 09/06/13)  Undermining: surrounding approximately from 0.1-.0.3 cm (measured 09/06/13) None following debridement today Granulation: 100% after debridement  Slough: 0% after debridement  Drainage (amount/description): serosanginious  Periwound: dry with calloused areas  Other: All undermining removed from perimeter of wound Dressing Type: aquacell; 4x4 and 3" kling  Sharps Used/Location: forcep, scissors  Tissue Remove: callous, dead dry skin    Physical Therapy Assessment and Plan  PT Assessment and Plan  Clinical Impression Statement:  Approx 1 cm of undermining debrided perimeter of wound.  Now without undermining and perimeter shows approximation.  Central wound continues to feel boggy with unknown depth.  Difficult to debride central portion as it bleeds easily.  Wound appears healthy and is without signs or sx of infection. Pt tolerates debridement well. Continued with silver alginate packing and 3" conform/netting.  PT Plan: Continue wound care per PT POC.     Problem List Patient Active Problem List   Diagnosis Date Noted  . Unstable balance 08/11/2013  . Foot and toe(s), blister, infected 08/11/2013  . Difficulty in walking(719.7) 08/11/2013  . Weakness  of right leg 08/11/2013  . Acute ischemic stroke 08/03/2013  . Hypokalemia 08/03/2013  . Diabetes mellitus without complication   . Hypertension   . Neuropathy   . Chronic back pain   . Psoriasis   . Wound, open, foot     PT - End of Session Activity Tolerance: Patient tolerated treatment well General Behavior During Therapy: WFL for tasks assessed/performed   Lurena Nida, PTA/CLT 09/18/2013, 3:00 PM

## 2013-09-19 NOTE — Evaluation (Signed)
Occupational Therapy Evaluation  Patient Details  Name: Madison Reed MRN: [id number] Date of Birth: May 12, 1951  Today's Date: 09/18/2013 Time: 4098-1191 OT Time Calculation (min): 38 min Evaluation 1310-1338 (28') TherActivities 4782-9562 (10')  Visit#: 1 of 8  Re-eval: 10/16/13  Assessment Diagnosis: CVA 08/03/2013 with right sided weakness  Next MD Visit: Jan 2015 Prior Therapy: acute care and currently on PT services.   Authorization: Medicare  Authorization Time Period: Before 10th visit  Authorization Visit#: 1 of 10   Past Medical History:  Past Medical History  Diagnosis Date  . Diabetes mellitus without complication   . Hypertension   . Neuropathy   . Chronic back pain   . Bone spur   . Wound, open, foot   . Psoriasis   . Abdominal aneurysm   . Kidney stones    Past Surgical History:  Past Surgical History  Procedure Laterality Date  . Vesicovaginal fistula closure w/ tah    . Abdominal hysterectomy    . Bladder suspension      Subjective Symptoms/Limitations Symptoms: S:  I can write but it is just not very good and I am not able to lift anything heavy  Pertinent History: Patient is a 62 yr old right handed female who had a CVA on 08/03/2013 with resulting right sided weakness and speech difficulties.  Patient presents this date with improved speech, however with cont complaint of RUE weakness.   Limitations: fall risk  Patient Stated Goals: To be able to write more legibly and use my hand better.  Pain Assessment Currently in Pain?: No/denies  Precautions/Restrictions  Precautions Precautions: Fall Required Braces or Orthoses:  (walking shoe right foot secondary to wound )  Balance Screening Balance Screen Has the patient fallen in the past 6 months: Yes How many times?: 1 Has the patient had a decrease in activity level because of a fear of falling? : No Is the patient reluctant to leave their home because of a fear of falling? : No  Prior  Functioning  Home Living Family/patient expects to be discharged to:: Private residence Living Arrangements: Spouse/significant other Available Help at Discharge: Family Type of Home: House Home Access: Stairs to enter Secretary/administrator of Steps: 3 Entrance Stairs-Rails: None Home Layout: Laundry or work area in basement (daughter does Pharmacologist) Home Equipment: Gilmer Mor - single point;Shower seat;Toilet riser;Walker - 2 wheels Prior Function Level of Independence: Independent with basic ADLs;Independent with homemaking with ambulation  Able to Take Stairs?: Yes Driving: Yes (has not driven since stroke ) Vocation: Unemployed (attempting to obtain disability ) Leisure: Hobbies-yes (Comment) Comments: reading, walk, crochet, puzzles, writing cards   Assessment ADL/Vision/Perception ADL ADL Comments: difficulty fastening her bra from behind  Dominant Hand: Right Vision - History Baseline Vision: Wears glasses only for reading Vision - Assessment Eye Alignment: Within Functional Limits  Cognition/Observation Cognition Overall Cognitive Status: Within Functional Limits for tasks assessed Arousal/Alertness: Awake/alert Orientation Level: Oriented X4 Safety/Judgment: Appears intact  Sensation/Coordination/Edema Sensation Light Touch: Appears Intact (patient has diabetic neuropathy in BUE/hand from PTA. ) Stereognosis: Appears Intact Proprioception: Appears Intact Coordination Gross Motor Movements are Fluid and Coordinated: Yes Fine Motor Movements are Fluid and Coordinated: Yes 9 Hole Peg Test: Right 27.36; Left 26.85  Additional Assessments RUE Assessment RUE Assessment: Within Functional Limits (patient demos full range however delayed movements ) RUE Strength Grip (lbs): 50 Lateral Pinch: 14 lbs 3 Point Pinch: 12 lbs LUE Strength Grip (lbs): 50 Lateral Pinch: 16 lbs 3 Point Pinch: 14 lbs Right  Hand Strength - Pinch (lbs) Lateral Pinch: 14 lbs 3 Point Pinch: 12  lbs Left Hand Strength - Pinch (lbs) Lateral Pinch: 16 lbs 3 Point Pinch: 14 lbs     Occupational Therapy Assessment and Plan OT Assessment and Plan Clinical Impression Statement: A:  Patient presents with high level fine motor deficits, RUE weakness s/p CVA 08/03/2013.  These deficits are causing decreased independence with B/IADL and leisure activities with use of her dominant RUE.   Pt will benefit from skilled therapeutic intervention in order to improve on the following deficits: Impaired UE functional use;Decreased activity tolerance;Decreased coordination;Decreased strength;Impaired sensation Rehab Potential: Good OT Frequency: Min 2X/week OT Duration: 4 weeks OT Treatment/Interventions: Self-care/ADL training;Therapeutic activities;Therapeutic exercise;Neuromuscular education;Patient/family education;DME and/or AE instruction;Manual therapy;Modalities OT Plan: P:  Skilled OT intervention to increase general coordination, improve AROM abilities, strength, fine motor coordination needed to use dominant RUE with all IADLs.   Treatment Plan:  RUE strengthening, right hand strengthening, coordination training and HEPs    Goals Home Exercise Program Pt/caregiver will Perform Home Exercise Program: For increased strengthening;Independently PT Goal: Perform Home Exercise Program - Progress: Goal set today Short Term Goals Time to Complete Short Term Goals: 2 weeks Short Term Goal 1: Patient will be educated on HEP Short Term Goal 2: Patient will increase right grip by >/= 3# and pinch by >/=1# for increased ability to open containers. Short Term Goal 3: Patient will demo improved ability to perform ab/adduction with Right digits faster or equal to non-dominant LUE.  Short Term Goal 4: Patient will improve time on 9 hole peg score by at least 2 seconds for increased ability to manipulate zippers, coins, etc.   Short Term Goal 5: Patient will complete writing at least 3 sentences with good  legibility to improve writing endurance and begin leisure activities again.  Long Term Goals Time to Complete Long Term Goals: 4 weeks Long Term Goal 1: Patient will return to prior level of independence with all B/IADL and leisure activities utilizing dominant RUE.   Long Term Goal 2: Patient will increase right grip strength by >/= 5# and pinch by >/=3# for increased ability to open new containers and decrease fatigue with writing tasks.    Long Term Goal 3: Patient will improve 9 hole peg test by at least 4 seconds from initial to improving fine motor coordination and independent manipulation of coins, clothing fasteners.   Long Term Goal 4: Patients will improve ability to complete writing task of full paragraph >5 sentences with good legibility for increased task endurance in order to complete leisure writing activities.  Problem List Patient Active Problem List   Diagnosis Date Noted  . Weakness of right arm/hand 09/18/2013  . Unstable balance 08/11/2013  . Foot and toe(s), blister, infected 08/11/2013  . Difficulty in walking(719.7) 08/11/2013  . Weakness of right leg 08/11/2013  . Acute ischemic stroke 08/03/2013  . Hypokalemia 08/03/2013  . Diabetes mellitus without complication   . Hypertension   . Neuropathy   . Chronic back pain   . Psoriasis   . Wound, open, foot     End of Session Activity Tolerance: Patient tolerated treatment well General Behavior During Therapy: WFL for tasks assessed/performed OT Plan of Care OT Home Exercise Plan: coordination activities for home  OT Patient Instructions: verbal iinstruction of initial coordination tasks to increase hand endurance at home with verbalization of understanding.   Consulted and Agree with Plan of Care: Patient;Family member/caregiver Family Member Consulted: daughter   GO  Functional Assessment Tool Used: DASH 47.73 Functional Limitation: Carrying, moving and handling objects Carrying, Moving and Handling Objects  Current Status (Z6109): At least 20 percent but less than 40 percent impaired, limited or restricted Carrying, Moving and Handling Objects Goal Status [phone]): At least 1 percent but less than 20 percent impaired, limited or restricted  Velora Mediate, OTR/L  09/18/2013, 1:53 PM  Physician Documentation Your signature is required to indicate approval of the treatment plan as stated above.  Please sign and either send electronically or make a copy of this report for your files and return this physician signed original.  Please mark one 1.__approve of plan  2. ___approve of plan with the following conditions.   ______________________________                                                          _____________________ Physician Signature                                                                                                             Date

## 2013-09-21 ENCOUNTER — Ambulatory Visit (HOSPITAL_COMMUNITY)
Admission: RE | Admit: 2013-09-21 | Discharge: 2013-09-21 | Disposition: A | Discharge status: Home / Self Care | Payer: BC Managed Care – PPO | Source: Ambulatory Visit | Attending: Family Medicine | Admitting: Family Medicine

## 2013-09-21 NOTE — Progress Notes (Signed)
Physical Therapy Treatment Patient Details  Name: Madison Reed MRN: [id number] Date of Birth: 1951-08-25  Today's Date: 09/21/2013 Time: 1022-1050 PT Time Calculation (min): 28 min Treatment:  Debridement  Visit#: 14 of 17  Re-eval: 10/04/13    Authorization: [employer]  Authorization Visit#: 14 of 17   Subjective: Symptoms/Limitations Symptoms: Pt states her big toehas better coloring and less swelling Pain Assessment Currently in Pain?: No/denies  Pain: 0  Objective:  Wound 1  Location:  Plantar aspect Rt great toe     Length:  1.5 ( approximate) Depth: 1.0 (approximate) Width: 1.0 Granulation:  99 Slough: 1 Drainage (amount/description): slough min Periwound: calloused Other:  Wound size is increasing as callous is being removed  Dressing Type: Aquacell packed into wound on great toe followed by 4x4; 3 " kling and netting.    Physical Therapy Assessment and Plan PT Assessment and Plan Clinical Impression Statement: Pt toe has better coloring; Pt calloused area along 4th metatarsal has small opening again. Wound on great toe appears clean but continues to have depth and undermining with a callous halo. Wound size appears to be increasing as callous is being removed but this is secondary to undermining of wound.  Goals  progressing  Problem List Patient Active Problem List   Diagnosis Date Noted  . Weakness of right arm/hand 09/18/2013  . Unstable balance 08/11/2013  . Foot and toe(s), blister, infected 08/11/2013  . Difficulty in walking(719.7) 08/11/2013  . Weakness of right leg 08/11/2013  . Acute ischemic stroke 08/03/2013  . Hypokalemia 08/03/2013  . Diabetes mellitus without complication   . Hypertension   . Neuropathy   . Chronic back pain   . Psoriasis   . Wound, open, foot        GP    RUSSELL,CINDY 09/21/2013, 11:37 AM

## 2013-09-25 ENCOUNTER — Ambulatory Visit (HOSPITAL_COMMUNITY)
Admission: RE | Admit: 2013-09-25 | Discharge: 2013-09-25 | Disposition: A | Discharge status: Home / Self Care | Payer: BC Managed Care – PPO | Source: Ambulatory Visit | Attending: Family Medicine | Admitting: Family Medicine

## 2013-09-25 NOTE — Progress Notes (Signed)
Physical Therapy Treatment Patient Details  Name: Madison Reed MRN: [id number] Date of Birth: 1951-02-12  Today's Date: 09/25/2013 Time: 1022-1050 PT Time Calculation (min): 28 min  Visit#: 15 of 17  Re-eval: 10/04/13 Authorization: [employer]  Authorization Visit#: 15 of 17  Charges:  Deb<20cm  Subjective: Pt reports no pain or difficulty.     Wound Therapy  Objective:  Wound 1  Location: Great toe/ callous at 4th MTP head  Width: 0.2 cm (measured 09/06/13)  Length: 0.2 cm (measured 09/06/13)  Depth 0.5 cm (measured 09/06/13)  Undermining: surrounding approximately from 0.1-.0.3 cm (measured 09/06/13) None following debridement today  Granulation: 100% after debridement  Slough: 0% after debridement  Drainage (amount/description): none Periwound: dry with calloused areas  Dressing Type: aquacell; 4x4 and 3" kling  Sharps Used/Location: forcep, scissors  Tissue Remove: callous, dead dry skin     Physical Therapy Assessment and Plan  PT Assessment and Plan  Clinical Impression Statement:  No longer with undermining perimeter of wound. Overall improvement with increased approximation. Wound appears healthy and is without signs or sx of infection. Pt tolerates debridement well. Changed to hydrogel as pt no longer with drainage and with increased dryness.  PT Plan: Continue wound care per PT POC      Problem List Patient Active Problem List   Diagnosis Date Noted  . Weakness of right arm/hand 09/18/2013  . Unstable balance 08/11/2013  . Foot and toe(s), blister, infected 08/11/2013  . Difficulty in walking(719.7) 08/11/2013  . Weakness of right leg 08/11/2013  . Acute ischemic stroke 08/03/2013  . Hypokalemia 08/03/2013  . Diabetes mellitus without complication   . Hypertension   . Neuropathy   . Chronic back pain   . Psoriasis   . Wound, open, foot         Lurena Nida, PTA/CLT 09/25/2013, 11:06 AM

## 2013-09-27 ENCOUNTER — Ambulatory Visit (HOSPITAL_COMMUNITY): Payer: BC Managed Care – PPO | Admitting: Physical Therapy

## 2013-09-28 ENCOUNTER — Ambulatory Visit (HOSPITAL_COMMUNITY): Payer: BC Managed Care – PPO | Admitting: Physical Therapy

## 2013-10-02 ENCOUNTER — Ambulatory Visit (HOSPITAL_COMMUNITY)
Admission: RE | Admit: 2013-10-02 | Discharge: 2013-10-02 | Disposition: A | Discharge status: Home / Self Care | Payer: BC Managed Care – PPO | Source: Ambulatory Visit | Attending: Family Medicine | Admitting: Family Medicine

## 2013-10-02 NOTE — Progress Notes (Signed)
Physical Therapy Re-evaluation / Discharge  Patient Details  Name: Madison Reed MRN: [id number] Date of Birth: 1951-07-07  Today's Date: 10/02/2013 Time: 1478-2956 PT Time Calculation (min): 18 min           Visit#: 16 of 17  Re-eval: 10/04/13 Authorization: [employer]    Authorization Visit#: 16 of 17  Charges  Deb <20 cm    Subjective Symptoms/Limitations Symptoms: Pt states it has not drained in 2 weeks.  Reports no pain or difficulties Pain Assessment Currently in Pain?: No/denies  Wound Therapy  Objective:  Wound 1  Location: Great toe/ callous at 4th MTP head  Width: 0 cm (was 0.2 cm measured 09/06/13)  Length: 0 cm (was 0.2 cm measured 09/06/13)  Depth 0 cm (0.5 cm measured 09/06/13)  Undermining: absent (was .1-.3 cm measured 09/06/13)  Granulation: 100% Slough: 0% Drainage (amount/description): none  Periwound: dry with calloused areas  Dressing Type: none, bandaid only Sharps Used/Location: forcep Tissue Remove: callous, dead dry skin     Physical Therapy Assessment and Plan PT Assessment and Plan Clinical Impression Statement: Debrided remaining callous perimeter of wound to reveal closure of wound.  Unable to discover opening or depth central wound.  Wound appears to be healed.  Instructed pt to keep area covered for several more weeks to protect new skin growth.  Pt also instructed to return to regular walking shoe.  PT Plan: Discharge as wound is healed, no longer in need of skilled therapy.    Goals PT Short Term Goals PT Short Term Goal 1: Pt Berg score to be improved to be improved 5 pts to be able to ambulate with a cane. PT Short Term Goal 1 - Progress: Met (met 4 weeks ago) PT Short Term Goal 2: pt to be able to come sit to stand without effort in one attempt. PT Short Term Goal 2 - Progress: Met PT Short Term Goal 3: Wound undermining to be decreased to .5 cm ; depth decreasd to 2.0 xm PT Short Term Goal 3 - Progress: Met PT Short Term Goal  4: wound to be free of foul smell PT Short Term Goal 4 - Progress: Met PT Short Term Goal 5: Wound to have minimal drainage PT Short Term Goal 5 - Progress: Met PT Long Term Goals PT Long Term Goal 1: Berg improved to at least 52 to allow ambulation without assistive device. PT Long Term Goal 1 - Progress: Met PT Long Term Goal 2: Pt to state she is at her normal cadence of walking. PT Long Term Goal 2 - Progress: Met Long Term Goal 3: scant drainage of wound Long Term Goal 3 Progress: Met Long Term Goal 4: Wound depth to be no greater than 1 cm without undermining Long Term Goal 4 Progress: Met PT Long Term Goal 5: Able to go up and down 4 steps I with use of a handrail Long Term Goal 5 Progress: Met  Problem List Patient Active Problem List   Diagnosis Date Noted  . Weakness of right arm/hand 09/18/2013  . Unstable balance 08/11/2013  . Foot and toe(s), blister, infected 08/11/2013  . Difficulty in walking(719.7) 08/11/2013  . Weakness of right leg 08/11/2013  . Acute ischemic stroke 08/03/2013  . Hypokalemia 08/03/2013  . Diabetes mellitus without complication   . Hypertension   . Neuropathy   . Chronic back pain   . Psoriasis   . Wound, open, foot     PT - End of Session Activity  Tolerance: Patient tolerated treatment well  GP Functional Assessment Tool Used: clinical judgement  Other PT Primary Current Status (W0981): At least 1 percent but less than 20 percent impaired, limited or restricted Other PT Primary Goal Status (X9147): At least 20 percent but less than 40 percent impaired, limited or restricted  Lurena Nida, PTA/CLT 10/02/2013, 10:16 AM

## 2013-10-04 ENCOUNTER — Ambulatory Visit (HOSPITAL_COMMUNITY): Payer: BC Managed Care – PPO | Admitting: Physical Therapy

## 2013-10-06 ENCOUNTER — Ambulatory Visit (HOSPITAL_COMMUNITY): Payer: BC Managed Care – PPO

## 2013-10-09 ENCOUNTER — Ambulatory Visit (HOSPITAL_COMMUNITY): Payer: BC Managed Care – PPO | Admitting: Physical Therapy

## 2013-10-09 ENCOUNTER — Ambulatory Visit (HOSPITAL_COMMUNITY)
Admission: RE | Admit: 2013-10-09 | Discharge: 2013-10-09 | Disposition: A | Discharge status: Home / Self Care | Payer: BC Managed Care – PPO | Source: Ambulatory Visit | Attending: Family Medicine | Admitting: Family Medicine

## 2013-10-09 NOTE — Progress Notes (Signed)
Occupational Therapy Treatment Patient Details  Name: Madison Reed MRN: [id number] Date of Birth: 08-27-1951  Today's Date: 09/25/2013 Time: 0940-1020 OT Time Calculation (min): 40 min TherExercises 0940-1005 (25') Neuro Re-education 1005-1020 (15')  Visit#: 2 of 8  Re-eval: 10/16/13    Authorization: Medicare  Authorization Time Period: Before 10th visit  Authorization Visit#: 2 of 10  Subjective Symptoms/Limitations Symptoms: S:  I have been writing my name a little better.  Pain Assessment Currently in Pain?: No/denies   Exercise/Treatments Wrist Exercises Forearm Supination: AROM;10 reps;Bar weights/barbell Bar Weights/Barbell (Forearm Supination): 1 lb Forearm Pronation: AROM;10 reps;Bar weights/barbell Bar Weights/Barbell (Forearm Pronation): 1 lb Wrist Flexion: AROM;10 reps;Bar weights/barbell Bar Weights/Barbell (Wrist Flexion): 1 lb Wrist Extension: AROM;10 reps;Bar weights/barbell Bar Weights/Barbell (Wrist Extension): 1 lb Wrist Radial Deviation: AROM;10 reps;Bar weights/barbell (1#) Wrist Ulnar Deviation: AROM;10 reps;Bar weights/barbell (1#) Other wrist exercises: supination to attain cones from right ; pronate to stack upside down on left with min-mod difficultuy    Sponges: 25 firm sponges with green clothes pin to attain and put in box  Theraputty: Flatten;Roll;Grip;Pinch (putty press with 3/4" dowel x 25 ) Theraputty - Flatten: red Theraputty - Roll: red Theraputty - Grip: red Theraputty - Pinch: red  Hand Exercises Theraputty: Flatten;Roll;Grip;Pinch (putty press with 3/4" dowel x 25 ) Theraputty - Flatten: red Theraputty - Roll: red Theraputty - Grip: red Theraputty - Pinch: red Sponges: 25 firm sponges with green clothes pin to attain and put in box  In Hand Manipulation Training: picked up 10 x 3 in right hand then translated to pincer grasp and into box with mod difficulty         Occupational Therapy Assessment and Plan OT Assessment and  Plan Clinical Impression Statement: A:  Tolerated putty activities with increased fatigue at end of session.  reports increased ability/legibility to write name.  mod difficulty with in hand grasping, manipulation and translation this date.  OT Plan: P:  hand writing tasks for home, inhand manipulation, coordination activities.     Goals Short Term Goals Short Term Goal 1: Patient will be educated on HEP Short Term Goal 1 Progress: Progressing toward goal Short Term Goal 2: Patient will increase right grip by >/= 3# and pinch by >/=1# for increased ability to open containers. Short Term Goal 2 Progress: Progressing toward goal Short Term Goal 3: Patient will demo improved ability to perform ab/adduction with Right digits faster or equal to non-dominant LUE.  Short Term Goal 3 Progress: Progressing toward goal Short Term Goal 4: Patient will improve time on 9 hole peg score by at least 2 seconds for increased ability to manipulate zippers, coins, etc.   Short Term Goal 4 Progress: Progressing toward goal Short Term Goal 5: Patient will complete writing at least 3 sentences with good legibility to improve writing endurance and begin leisure activities again.  Short Term Goal 5 Progress: Progressing toward goal Long Term Goals Long Term Goal 1: Patient will return to prior level of independence with all B/IADL and leisure activities utilizing dominant RUE.   Long Term Goal 1 Progress: Progressing toward goal Long Term Goal 2: Patient will increase right grip strength by >/= 5# and pinch by >/=3# for increased ability to open new containers and decrease fatigue with writing tasks.    Long Term Goal 2 Progress: Progressing toward goal Long Term Goal 3: Patient will improve 9 hole peg test by at least 4 seconds from initial to improving fine motor coordination and independent manipulation of  coins, clothing fasteners.   Long Term Goal 3 Progress: Progressing toward goal Long Term Goal 4: Patients  will improve ability to complete writing task of full paragraph >5 sentences with good legibility for increased task endurance in order to complete leisure writing activities. Long Term Goal 4 Progress: Progressing toward goal  Problem List Patient Active Problem List   Diagnosis Date Noted  . Weakness of right arm/hand 09/18/2013  . Unstable balance 08/11/2013  . Foot and toe(s), blister, infected 08/11/2013  . Difficulty in walking(719.7) 08/11/2013  . Weakness of right leg 08/11/2013  . Acute ischemic stroke 08/03/2013  . Hypokalemia 08/03/2013  . Diabetes mellitus without complication   . Hypertension   . Neuropathy   . Chronic back pain   . Psoriasis   . Wound, open, foot     End of Session Activity Tolerance: Patient tolerated treatment well General Behavior During Therapy: [facility] for tasks assessed/performed  GO    Velora Mediate, OTR/L  09/25/2013, 11:59 AM

## 2013-10-10 NOTE — Progress Notes (Signed)
Occupational Therapy Treatment Patient Details  Name: Madison Reed MRN: [id number] Date of Birth: Jul 04, 1951  Today's Date: 10/02/2013 Time: 1308-6578 OT Time Calculation (min): 41 min TherExercises 1014-1029 (15') Neuro Re-Education 1029-1055 (26')  Visit#: 3 of 8  Re-eval: 10/16/13    Authorization: Medicare  Authorization Time Period: Before 10th visit  Authorization Visit#: 3 of 10  Subjective Symptoms/Limitations Symptoms: S:  My writing is better but it is not as good as I want it to be.  Pain Assessment Currently in Pain?: No/denies Pain Score: 5  Pain Location: Shoulder     Exercise/Treatments Wrist Exercises Forearm Supination: AROM;10 reps;Bar weights/barbell Bar Weights/Barbell (Forearm Supination): 1 lb Forearm Pronation: AROM;10 reps;Bar weights/barbell Bar Weights/Barbell (Forearm Pronation): 1 lb Wrist Flexion: AROM;10 reps;Bar weights/barbell Bar Weights/Barbell (Wrist Flexion): 1 lb Wrist Extension: AROM;10 reps;Bar weights/barbell Bar Weights/Barbell (Wrist Extension): 1 lb Wrist Radial Deviation: AROM;10 reps;Bar weights/barbell (1#) Wrist Ulnar Deviation: AROM;10 reps;Bar weights/barbell (1#)   Sponges: 25 firm sponges with green clothes pin to attain and put in box  Theraputty: Flatten;Roll;Grip;Pinch (putty press with 3/4" dowel x 25 ) Theraputty - Flatten: red Theraputty - Roll: red Theraputty - Grip: red Theraputty - Pinch: red  Hand Exercises Theraputty: Flatten;Roll;Grip;Pinch (putty press with 3/4" dowel x 25 ) Theraputty - Flatten: red Theraputty - Roll: red Theraputty - Grip: red Theraputty - Pinch: red Sponges: 25 firm sponges with green clothes pin to attain and put in box  In Hand Manipulation Training: picked up 10 x 3 in right hand then translated to pincer grasp and into box with mod difficulty  Other Hand Exercises: perfection game able to place 4, 5, 4, 4 in 4 attempts this date.        Occupational Therapy Assessment and  Plan OT Assessment and Plan Clinical Impression Statement: A:  patient arrives this date with handwriting pages completed on double lined paper with good legibility.  tolerated weighted exercises well this date and demo improving coordinaion as noted with perfection game.   OT Plan: P:  hand writing tasks for home single lines paper , inhand manipulation, placing at least 6 pieces in perfecion game     Goals Short Term Goals Short Term Goal 1: Patient will be educated on HEP Short Term Goal 1 Progress: Progressing toward goal Short Term Goal 2: Patient will increase right grip by >/= 3# and pinch by >/=1# for increased ability to open containers. Short Term Goal 2 Progress: Progressing toward goal Short Term Goal 3: Patient will demo improved ability to perform ab/adduction with Right digits faster or equal to non-dominant LUE.  Short Term Goal 3 Progress: Progressing toward goal Short Term Goal 4: Patient will improve time on 9 hole peg score by at least 2 seconds for increased ability to manipulate zippers, coins, etc.   Short Term Goal 4 Progress: Progressing toward goal Short Term Goal 5: Patient will complete writing at least 3 sentences with good legibility to improve writing endurance and begin leisure activities again.  Short Term Goal 5 Progress: Progressing toward goal Long Term Goals Long Term Goal 1: Patient will return to prior level of independence with all B/IADL and leisure activities utilizing dominant RUE.   Long Term Goal 1 Progress: Progressing toward goal Long Term Goal 2: Patient will increase right grip strength by >/= 5# and pinch by >/=3# for increased ability to open new containers and decrease fatigue with writing tasks.    Long Term Goal 2 Progress: Progressing toward goal Long Term  Goal 3: Patient will improve 9 hole peg test by at least 4 seconds from initial to improving fine motor coordination and independent manipulation of coins, clothing fasteners.   Long  Term Goal 3 Progress: Progressing toward goal Long Term Goal 4: Patients will improve ability to complete writing task of full paragraph >5 sentences with good legibility for increased task endurance in order to complete leisure writing activities. Long Term Goal 4 Progress: Progressing toward goal  Problem List Patient Active Problem List   Diagnosis Date Noted  . Weakness of right arm/hand 09/18/2013  . Unstable balance 08/11/2013  . Foot and toe(s), blister, infected 08/11/2013  . Difficulty in walking(719.7) 08/11/2013  . Weakness of right leg 08/11/2013  . Acute ischemic stroke 08/03/2013  . Hypokalemia 08/03/2013  . Diabetes mellitus without complication   . Hypertension   . Neuropathy   . Chronic back pain   . Psoriasis   . Wound, open, foot     End of Session Activity Tolerance: Patient tolerated treatment well General Behavior During Therapy: [facility] for tasks assessed/performed  GO    Velora Mediate, OTR/L  10/02/2013, 11:50 PM

## 2013-10-11 ENCOUNTER — Ambulatory Visit (HOSPITAL_COMMUNITY): Payer: BC Managed Care – PPO | Admitting: Specialist

## 2013-10-11 ENCOUNTER — Ambulatory Visit (HOSPITAL_COMMUNITY): Payer: BC Managed Care – PPO | Admitting: Physical Therapy

## 2013-10-11 NOTE — Progress Notes (Signed)
Occupational Therapy Treatment Patient Details  Name: Madison Reed MRN: [id number] Date of Birth: 14-Oct-1950  Today's Date: 10/09/2013 Time: 1012-1100 OT Time Calculation (min): 48 min  TherExercises 1012-1032 (20') Neuro Re-Education 1032-1100 (28')  Visit#: 4 of 8  Re-eval: 10/16/13    Authorization: Medicare  Authorization Time Period: Before 10th visit  Authorization Visit#: 4 of 10  Subjective Symptoms/Limitations Symptoms: S:  I am writing more, using my hand more but it doesnt look like my writing....  Pain Assessment Currently in Pain?: Yes Pain Score: 5  Pain Location: Shoulder Pain Orientation: Right   Exercise/Treatments Wrist Exercises Forearm Supination: AROM;10 reps;Bar weights/barbell Bar Weights/Barbell (Forearm Supination): 1 lb;2 lbs Forearm Pronation: AROM;10 reps;Bar weights/barbell Bar Weights/Barbell (Forearm Pronation): 1 lb;2 lbs Wrist Flexion: AROM;10 reps;Bar weights/barbell Bar Weights/Barbell (Wrist Flexion): 1 lb;2 lbs Wrist Extension: AROM;10 reps;Bar weights/barbell Bar Weights/Barbell (Wrist Extension): 1 lb;2 lbs Wrist Radial Deviation: AROM;10 reps;Bar weights/barbell (2#) Wrist Ulnar Deviation: AROM;10 reps;Bar weights/barbell (2#)      Hand Exercises Theraputty: Flatten;Roll;Grip;Pinch (putty press with 3/4" dowel x 25 ) Theraputty - Flatten: red Theraputty - Roll: red Theraputty - Grip: red Theraputty - Pinch: red  Fine Motor Coordination:  (nut/bolt board x 4 remove/replace smallest) In Hand Manipulation Training: grooved pegboarfd with tweezerx x 13 Other Hand Exercises: perfection 6, 8, 7, 7, 7,        Occupational Therapy Assessment and Plan OT Assessment and Plan Clinical Impression Statement: A:  patient arrives this daet with handwriting papers on single wide line with improving legibility and report of decreased fatigue with task.  Improved coordination this date with perfection game. Mod difficulty with tweezer  activity this date.   issued red putty for home HEP and standard single ruled paper for writing activities.  OT Plan: P:  inhand manipulation, placing at least 8 pieces in perfecion game in 10 seconds. copy 3 sentence paragraph    Goals Short Term Goals Short Term Goal 1: Patient will be educated on HEP Short Term Goal 1 Progress: Progressing toward goal Short Term Goal 2: Patient will increase right grip by >/= 3# and pinch by >/=1# for increased ability to open containers. Short Term Goal 2 Progress: Progressing toward goal Short Term Goal 3: Patient will demo improved ability to perform ab/adduction with Right digits faster or equal to non-dominant LUE.  Short Term Goal 3 Progress: Progressing toward goal Short Term Goal 4: Patient will improve time on 9 hole peg score by at least 2 seconds for increased ability to manipulate zippers, coins, etc.   Short Term Goal 4 Progress: Progressing toward goal Short Term Goal 5: Patient will complete writing at least 3 sentences with good legibility to improve writing endurance and begin leisure activities again.  Short Term Goal 5 Progress: Progressing toward goal Long Term Goals Long Term Goal 1: Patient will return to prior level of independence with all B/IADL and leisure activities utilizing dominant RUE.   Long Term Goal 1 Progress: Progressing toward goal Long Term Goal 2: Patient will increase right grip strength by >/= 5# and pinch by >/=3# for increased ability to open new containers and decrease fatigue with writing tasks.    Long Term Goal 2 Progress: Progressing toward goal Long Term Goal 3: Patient will improve 9 hole peg test by at least 4 seconds from initial to improving fine motor coordination and independent manipulation of coins, clothing fasteners.   Long Term Goal 3 Progress: Progressing toward goal Long Term Goal 4: Patients  will improve ability to complete writing task of full paragraph >5 sentences with good legibility for  increased task endurance in order to complete leisure writing activities. Long Term Goal 4 Progress: Progressing toward goal  Problem List Patient Active Problem List   Diagnosis Date Noted  . Weakness of right arm/hand 09/18/2013  . Unstable balance 08/11/2013  . Foot and toe(s), blister, infected 08/11/2013  . Difficulty in walking(719.7) 08/11/2013  . Weakness of right leg 08/11/2013  . Acute ischemic stroke 08/03/2013  . Hypokalemia 08/03/2013  . Diabetes mellitus without complication   . Hypertension   . Neuropathy   . Chronic back pain   . Psoriasis   . Wound, open, foot     End of Session Activity Tolerance: Patient tolerated treatment well General Behavior During Therapy: [facility] for tasks assessed/performed  GO     10/09/2013, 12:13 PM

## 2013-10-16 ENCOUNTER — Ambulatory Visit (HOSPITAL_COMMUNITY)
Admission: RE | Admit: 2013-10-16 | Discharge: 2013-10-16 | Disposition: A | Discharge status: Home / Self Care | Payer: BC Managed Care – PPO | Source: Ambulatory Visit | Attending: Family Medicine | Admitting: Family Medicine

## 2013-10-16 DIAGNOSIS — R262 Difficulty in walking, not elsewhere classified: Secondary | ICD-10-CM | POA: Insufficient documentation

## 2013-10-16 DIAGNOSIS — IMO0001 Reserved for inherently not codable concepts without codable children: Secondary | ICD-10-CM | POA: Insufficient documentation

## 2013-10-16 DIAGNOSIS — L97409 Non-pressure chronic ulcer of unspecified heel and midfoot with unspecified severity: Secondary | ICD-10-CM | POA: Insufficient documentation

## 2013-10-16 DIAGNOSIS — I10 Essential (primary) hypertension: Secondary | ICD-10-CM | POA: Insufficient documentation

## 2013-10-16 DIAGNOSIS — E1142 Type 2 diabetes mellitus with diabetic polyneuropathy: Secondary | ICD-10-CM | POA: Insufficient documentation

## 2013-10-16 DIAGNOSIS — E1149 Type 2 diabetes mellitus with other diabetic neurological complication: Secondary | ICD-10-CM | POA: Insufficient documentation

## 2013-10-16 DIAGNOSIS — E1169 Type 2 diabetes mellitus with other specified complication: Secondary | ICD-10-CM | POA: Insufficient documentation

## 2013-10-16 NOTE — Evaluation (Signed)
Occupational Therapy Re-Evaluation  Patient Details  Name: Madison Reed MRN: [id number] Date of Birth: 02-18-1951  Today's Date: 10/16/2013 Time: 5681-2751 OT Time Calculation (min): 44 min Therapeutic exercises [phone] 25' Reassessment 1000-1019 19' Visit#: 5 of 8  Re-eval: 10/16/13  Assessment Diagnosis: CVA 08/03/2013 with right sided weakness   Authorization: Medicare  Authorization Visit#: 5 of 10   Past Medical History:  Past Medical History  Diagnosis Date  . Diabetes mellitus without complication   . Hypertension   . Neuropathy   . Chronic back pain   . Bone spur   . Wound, open, foot   . Psoriasis   . Abdominal aneurysm   . Kidney stones    Past Surgical History:  Past Surgical History  Procedure Laterality Date  . Vesicovaginal fistula closure w/ tah    . Abdominal hysterectomy    . Bladder suspension      Subjective Symptoms/Limitations Special Tests:  was 49 and is currently  Pain Assessment Currently in Pain?: No/denies Pain Score: 0-No pain    Sensation/Coordination/Edema Coordination 9 Hole Peg Test: (09/19/13)  right 26.85" (27.36")  Additional Assessments RUE Strength Grip (lbs): 65# (50#) Lateral Pinch: 16 lbs ((14)) 3 Point Pinch: 16 lbs (12) Right Hand Strength - Pinch (lbs) Lateral Pinch: 16 lbs ((14)) 3 Point Pinch: 16 lbs (12)   Exercise/Treatments Hand Exercises Theraputty: Flatten;Roll;Grip;Pinch Theraputty - Flatten: red Theraputty - Roll: red Theraputty - Grip: red Theraputty - Pinch: red        Occupational Therapy Assessment and Plan OT Assessment and Plan Clinical Impression Statement: A:  Patient has met or partly met all short term and long term goals.  She is completing all daily activities and is satisfied with her current functional status.   OT Plan: P: DC from skilled OT services this date.     Goals Short Term Goals Short Term Goal 1: Patient will be educated on HEP Short Term Goal 1 Progress:  Met Short Term Goal 2: Patient will increase right grip by >/= 3# and pinch by >/=1# for increased ability to open containers. Short Term Goal 2 Progress: Met Short Term Goal 3: Patient will demo improved ability to perform ab/adduction with Right digits faster or equal to non-dominant LUE.  Short Term Goal 3 Progress: Met Short Term Goal 4: Patient will improve time on 9 hole peg score by at least 2 seconds for increased ability to manipulate zippers, coins, etc.   Short Term Goal 4 Progress: Partly met Short Term Goal 5: Patient will complete writing at least 3 sentences with good legibility to improve writing endurance and begin leisure activities again.  Short Term Goal 5 Progress: Met Long Term Goals Long Term Goal 1: Patient will return to prior level of independence with all B/IADL and leisure activities utilizing dominant RUE.   Long Term Goal 1 Progress: Met Long Term Goal 2: Patient will increase right grip strength by >/= 5# and pinch by >/=3# for increased ability to open new containers and decrease fatigue with writing tasks.    Long Term Goal 2 Progress: Met Long Term Goal 3: Patient will improve 9 hole peg test by at least 4 seconds from initial to improving fine motor coordination and independent manipulation of coins, clothing fasteners.   Long Term Goal 3 Progress: Partly met Long Term Goal 4: Patients will improve ability to complete writing task of full paragraph >5 sentences with good legibility for increased task endurance in order to complete leisure writing  activities. Long Term Goal 4 Progress: Met  Problem List Patient Active Problem List   Diagnosis Date Noted  . Weakness of right arm/hand 09/18/2013  . Unstable balance 08/11/2013  . Foot and toe(s), blister, infected 08/11/2013  . Difficulty in walking(719.7) 08/11/2013  . Weakness of right leg 08/11/2013  . Acute ischemic stroke 08/03/2013  . Hypokalemia 08/03/2013  . Diabetes mellitus without complication    . Hypertension   . Neuropathy   . Chronic back pain   . Psoriasis   . Wound, open, foot     End of Session Activity Tolerance: Patient tolerated treatment well OT Plan of Care OT Home Exercise Plan: coordination activities for home   GO Functional Assessment Tool Used: DASH was 49 and is currently 20 Functional Limitation: Carrying, moving and handling objects Carrying, Moving and Handling Objects Goal Status (L4917): At least 1 percent but less than 20 percent impaired, limited or restricted Carrying, Moving and Handling Objects Discharge Status [phone]): At least 1 percent but less than 20 percent impaired, limited or restricted  Vangie Bicker, OTR/L  10/16/2013, 10:26 AM  Physician Documentation Your signature is required to indicate approval of the treatment plan as stated above.  Please sign and either send electronically or make a copy of this report for your files and return this physician signed original.  Please mark one 1.__approve of plan  2. ___approve of plan with the following conditions.   ______________________________                                                          _____________________ Physician Signature                                                                                                             Date

## 2014-08-13 ENCOUNTER — Encounter (HOSPITAL_COMMUNITY): Payer: Self-pay | Admitting: Emergency Medicine

## 2015-03-05 ENCOUNTER — Ambulatory Visit (HOSPITAL_COMMUNITY): Payer: 59 | Attending: Internal Medicine | Admitting: Physical Therapy

## 2015-03-05 DIAGNOSIS — L97519 Non-pressure chronic ulcer of other part of right foot with unspecified severity: Secondary | ICD-10-CM | POA: Diagnosis not present

## 2015-03-05 DIAGNOSIS — E11621 Type 2 diabetes mellitus with foot ulcer: Secondary | ICD-10-CM | POA: Diagnosis present

## 2015-03-05 DIAGNOSIS — S91109A Unspecified open wound of unspecified toe(s) without damage to nail, initial encounter: Secondary | ICD-10-CM

## 2015-03-05 NOTE — Therapy (Signed)
[place], [place], [address] Phone: [phone]   Fax:  [phone]  Wound Care Evaluation  Patient Details  Name: Madison Reed MRN: [id number] Date of Birth: [birth year] Referring Provider:  Redmond School, MD  Encounter Date: 03/05/2015      PT End of Session - 03/05/15 1932    Visit Number 1   Number of Visits 16   Date for PT Re-Evaluation 04/04/15   Authorization Type BCBS   Authorization - Visit Number 1   Authorization - Number of Visits 16   PT Start Time 1224   PT Stop Time 1515   PT Time Calculation (min) 40 min   Activity Tolerance Patient tolerated treatment well   Behavior During Therapy [facility] for tasks assessed/performed      Past Medical History  Diagnosis Date  . Diabetes mellitus without complication   . Hypertension   . Neuropathy   . Chronic back pain   . Bone spur   . Wound, open, foot   . Psoriasis   . Abdominal aneurysm   . Kidney stones     Past Surgical History  Procedure Laterality Date  . Vesicovaginal fistula closure w/ tah    . Abdominal hysterectomy    . Bladder suspension      There were no vitals filed for this visit.  Visit Diagnosis:  Wound, open, toe, initial encounter - Plan: PT plan of care cert/re-cert      Subjective Assessment - 03/05/15 1915    Subjective Patient states having wound >6 monthswith MD originally perofrming wound care with no progress made. Notes he refised to remove white (dead) skin. He did prescribe silver due to infection for wound packing. Patient states panful to walk on though no pain during serssion.    Pertinent History history of diabets, stroke, and possibly decreased circulation (patint is unsure).          [facility] PT Assessment - 03/05/15 0001    Assessment   Medical Diagnosis Non-healing wound Rt great toe   Onset Date/Surgical Date 10/04/14   Next MD Visit unsure   Prior Therapy MD debridement with limited results.    Balance Screen   Has the patient fallen in the past 6 months No   Has the patient had a decrease in activity level because of a fear of falling?  Yes   Is the patient reluctant to leave their home because of a fear of falling?  No   Prior Function   Level of Independence Independent with basic ADLs         Wound Therapy - 03/05/15 1922    Subjective Patient states having wound >6 monthswith MD originally perofrming wound care with no progress made. Notes he refised to remove white (dead) skin. He did prescribe silver due to infection for wound packing. Patient states panful to walk on though no pain during serssion.    Date of Onset 09/16/14   Prior Treatments Yes MD debridment.    Evaluation and Treatment Procedures Explained to Patient/Family Yes   Evaluation and Treatment Procedures agreed to   Wound Properties Date First Assessed: 03/05/15 Time First Assessed: 1440 Wound Type: Diabetic ulcer Location: Toe (Comment  which one) , Rt Great Toe  Location Orientation: Right Wound Description (Comments): Rt inferior great toe wound, smelly,  Present on Admission: Yes   Dressing Type Silver dressings;Gauze (Comment)   Dressing Status Clean   Dressing Change Frequency Every 3 days   %  Wound base Red or Granulating 5%   % Wound base Yellow 90%   % Wound base Black 5%   Peri-wound Assessment Maceration   Wound Length (cm) 0.8 cm   Wound Width (cm) 0.5 cm   Wound Depth (cm) 0.7 cm   Tunneling (cm) 0   Undermining (cm) 0   Margins Unattached edges (unapproximated)   Closure None   Drainage Amount Minimal   Drainage Description Serous;Odor   Non-staged Wound Description Not applicable   Pulsed lavage therapy - wound location Rt great toe   Pulsed Lavage with Suction (psi) 200 psi   Pulsed Lavage with Suction - Normal Saline Used 500 mL   Pulsed Lavage Tip Tip with splash shield   Selective Debridement - Location Rt great toe   Selective Debridement - Tools Used Forceps;Scalpel;Scissors    Selective Debridement - Tissue Removed Slough   Wound Therapy - Clinical Statement Patient dispalys Rt great to wound on plantar surface secondary to a history of diabetes and decreased LE sensation. patient has been recieving wound care for several months from MD with limited results. patient displays sevrely deep wound in great toe with 90% slough indicating use of hydro therapy with pulsed lagave and selective debridment to improve removal of slough.    Wound Therapy - Functional Problem List difficulty walking due to pain   Factors Delaying/Impairing Wound Healing Diabetes Mellitus;Vascular compromise   Hydrotherapy Plan Dressing change;Debridement;Pulsatile lavage with suction   Wound Therapy - Frequency --  2x a week   Wound Plan Wound care to continue pulsed lavage for removal of deep slough in wound as well as contined selective debridment for dead skin preventing closue of wound. Nxt sessionassess swelling and blood pressure to determine if patient wound bene60fit from compression to decrease swelling.    Dressing  Gauze, silver,    Decrease Necrotic Tissue to 0%   Decrease Necrotic Tissue - Progress Goal set today   Increase Granulation Tissue to 100%   Increase Granulation Tissue - Progress Goal set today   Decrease Length/Width/Depth by (cm) .8x.5x.7   Decrease Length/Width/Depth - Progress Goal set today   Improve Drainage Characteristics Mod   Improve Drainage Characteristics - Progress Goal set today   Patient/Family will be able to  walk wihtout pain   Patient/Family Instruction Goal - Progress Goal set today                                    Problem List Patient Active Problem List   Diagnosis Date Noted  . Weakness of right arm/hand 09/18/2013  . Unstable balance 08/11/2013  . Foot and toe(s), blister, infected 08/11/2013  . Difficulty in walking(719.7) 08/11/2013  . Weakness of right leg 08/11/2013  . Acute ischemic stroke 08/03/2013   . Hypokalemia 08/03/2013  . Diabetes mellitus without complication   . Hypertension   . Neuropathy   . Chronic back pain   . Psoriasis   . Wound, open, foot    Devona Konig PT DPT [place], [place], [address] Phone: [phone]   Fax:  [phone]

## 2015-03-07 ENCOUNTER — Ambulatory Visit (HOSPITAL_COMMUNITY): Payer: 59 | Admitting: Physical Therapy

## 2015-03-07 DIAGNOSIS — S91109A Unspecified open wound of unspecified toe(s) without damage to nail, initial encounter: Secondary | ICD-10-CM

## 2015-03-07 DIAGNOSIS — E11621 Type 2 diabetes mellitus with foot ulcer: Secondary | ICD-10-CM | POA: Diagnosis not present

## 2015-03-07 DIAGNOSIS — R2689 Other abnormalities of gait and mobility: Secondary | ICD-10-CM

## 2015-03-07 DIAGNOSIS — R29898 Other symptoms and signs involving the musculoskeletal system: Secondary | ICD-10-CM

## 2015-03-07 NOTE — Therapy (Signed)
[place], [place], [address] Phone: [phone]   Fax:  [phone]  Wound Care Therapy  Patient Details  Name: Madison Reed MRN: [id number] Date of Birth: 1951/09/01 Referring Provider:  Elsie Lincoln, MD  Encounter Date: 03/07/2015      PT End of Session - 03/07/15 1041    Visit Number 2   Number of Visits 16   Date for PT Re-Evaluation 04/04/15   Authorization Type BCBS   Authorization - Visit Number 2   Authorization - Number of Visits 16   PT Start Time [phone]   PT Stop Time 0930   PT Time Calculation (min) 40 min   Activity Tolerance Patient tolerated treatment well   Behavior During Therapy [facility] for tasks assessed/performed      Past Medical History  Diagnosis Date  . Diabetes mellitus without complication   . Hypertension   . Neuropathy   . Chronic back pain   . Bone spur   . Wound, open, foot   . Psoriasis   . Abdominal aneurysm   . Kidney stones     Past Surgical History  Procedure Laterality Date  . Vesicovaginal fistula closure w/ tah    . Abdominal hysterectomy    . Bladder suspension      There were no vitals filed for this visit.  Visit Diagnosis:  Wound, open, toe, initial encounter  Weakness of right arm/hand  Unstable balance  Weakness of right leg                 Wound Therapy - 03/07/15 1017    Subjective Patient states she used to have a shoe to keep the weight off her toe but doesnt know where it is.  Pt comes today with bedroom shoe on and drainage soaked through bandage.  No pain reported.   Date of Onset 09/16/14   Prior Treatments Yes MD debridment.    Wound Properties Date First Assessed: 03/05/15 Time First Assessed: 1440 Wound Type: Diabetic ulcer Location: Toe (Comment  which one) , Rt Great Toe  Location Orientation: Right Wound Description (Comments): Rt inferior great toe wound, smelly,  Present on Admission: Yes   Dressing Type Silver  hydrofiber   Dressing Status New drainage;Intact   Dressing Change Frequency Every 3 days   % Wound base Red or Granulating 40%   % Wound base Yellow 60%   % Wound base Black 0%   % Wound base Other (Comment) --  callous perimeter of wound   Peri-wound Assessment Maceration  callous   Margins Unattached edges (unapproximated)   Closure None   Drainage Amount Minimal   Drainage Description Serosanguineous   Treatment Hydrotherapy (Pulse lavage);Debridement (Selective)   Pulsed lavage therapy - wound location Rt great toe   Pulsed Lavage with Suction (psi) 4 psi   Pulsed Lavage with Suction - Normal Saline Used 1000 mL   Pulsed Lavage Tip Tip with splash shield   Selective Debridement - Location Rt great toe   Selective Debridement - Tools Used Forceps;Scalpel;Scissors   Selective Debridement - Tissue Removed Slough   Wound Therapy - Clinical Statement Wound of significant deepness with thick callous perimeter of wound.  Pulse lavage completed and wound debrided removing slough and callous from perimeter.  Packed wound with silver hydrofiber and bandaged using coban to help with circulation/healing.  Spoke with patient about getting anouther offloading shoe to keep pressure from plantar surface of great toe  Wound Therapy - Functional Problem List difficulty walking due to pain   Factors Delaying/Impairing Wound Healing Diabetes Mellitus;Vascular compromise   Hydrotherapy Plan Dressing change;Debridement;Pulsatile lavage with suction   Wound Plan Wound care to continue pulsed lavage for removal of deep slough in wound as well as contined selective debridment for dead tissue preventing closue of wound. Check to see if patient has inquired about diabetic shoe and give offloading shoe or insole if needed.  Pt instructed to contact supplier of diabetic shoes to see if new insurance will cover.  Also instructed to contact MD and get order faxed to get shoes.    Dressing  Gauze, silver hydrofiber    Decrease Necrotic Tissue to 0%   Decrease Necrotic Tissue - Progress Progressing toward goal   Increase Granulation Tissue to 100%   Increase Granulation Tissue - Progress Progressing toward goal   Decrease Length/Width/Depth by (cm) .8x.5x.7   Decrease Length/Width/Depth - Progress Progressing toward goal   Improve Drainage Characteristics Mod   Improve Drainage Characteristics - Progress Progressing toward goal   Patient/Family will be able to  walk without pain   Patient/Family Instruction Goal - Progress Progressing toward goal                 PT Education - 03/07/15 1041    Education provided Yes   Education Details Wound care, need for off loading shoe, need for diabetic shoes to prevent further wounds   Person(s) Educated Patient   Methods Explanation   Comprehension Verbalized understanding                     Problem List Patient Active Problem List   Diagnosis Date Noted  . Weakness of right arm/hand 09/18/2013  . Unstable balance 08/11/2013  . Foot and toe(s), blister, infected 08/11/2013  . Difficulty in walking(719.7) 08/11/2013  . Weakness of right leg 08/11/2013  . Acute ischemic stroke 08/03/2013  . Hypokalemia 08/03/2013  . Diabetes mellitus without complication   . Hypertension   . Neuropathy   . Chronic back pain   . Psoriasis   . Wound, open, foot     Teena Irani, PTA/CLT [phone]  03/07/2015, 10:43 AM  [place], [place], [address] Phone: [phone]   Fax:  [phone]

## 2015-03-12 ENCOUNTER — Ambulatory Visit (HOSPITAL_COMMUNITY): Payer: 59 | Admitting: Physical Therapy

## 2015-03-12 DIAGNOSIS — R2689 Other abnormalities of gait and mobility: Secondary | ICD-10-CM

## 2015-03-12 DIAGNOSIS — R29898 Other symptoms and signs involving the musculoskeletal system: Secondary | ICD-10-CM

## 2015-03-12 DIAGNOSIS — S91109A Unspecified open wound of unspecified toe(s) without damage to nail, initial encounter: Secondary | ICD-10-CM

## 2015-03-12 DIAGNOSIS — E11621 Type 2 diabetes mellitus with foot ulcer: Secondary | ICD-10-CM | POA: Diagnosis not present

## 2015-03-12 NOTE — Therapy (Signed)
[place], [place], [address] Phone: [phone]   Fax:  [phone]  Wound Care Therapy  Patient Details  Name: Madison Reed MRN: [id number] Date of Birth: 1951/06/06 Referring Provider:  Redmond School, MD  Encounter Date: 03/12/2015      PT End of Session - 03/12/15 1508    Visit Number 3   Number of Visits 16   Date for PT Re-Evaluation 04/04/15   Authorization Type BCBS   Authorization - Visit Number 3   Authorization - Number of Visits 16   PT Start Time 1517   PT Stop Time 1230   PT Time Calculation (min) 38 min   Activity Tolerance Patient tolerated treatment well   Behavior During Therapy [facility] for tasks assessed/performed      Past Medical History  Diagnosis Date  . Diabetes mellitus without complication   . Hypertension   . Neuropathy   . Chronic back pain   . Bone spur   . Wound, open, foot   . Psoriasis   . Abdominal aneurysm   . Kidney stones     Past Surgical History  Procedure Laterality Date  . Vesicovaginal fistula closure w/ tah    . Abdominal hysterectomy    . Bladder suspension      There were no vitals filed for this visit.  Visit Diagnosis:  Wound, open, toe, initial encounter  Weakness of right arm/hand  Unstable balance  Weakness of right leg                 Wound Therapy - 03/12/15 1504    Subjective Pt states she went to MD and got put on antibiotics.  States she had to remove her dressings after getting them wet yesterday  States she tried to wrap it like we do.     Date of Onset 09/16/14   Prior Treatments Yes MD debridment.    Wound Properties Date First Assessed: 03/05/15 Time First Assessed: 1440 Wound Type: Diabetic ulcer Location: Toe (Comment  which one) , Rt Great Toe  Location Orientation: Right Wound Description (Comments): Rt inferior great toe wound, smelly,  Present on Admission: Yes   Dressing Type Silver hydrofiber   Dressing Status  New drainage;Intact   Dressing Change Frequency Every 3 days   % Wound base Red or Granulating 40%   % Wound base Yellow 60%   % Wound base Black 0%   % Wound base Other (Comment) --  callous perimeter of wound   Peri-wound Assessment Maceration  callous   Margins Unattached edges (unapproximated)   Closure None   Drainage Amount Minimal   Drainage Description Serosanguineous   Treatment Hydrotherapy (Pulse lavage);Debridement (Selective)   Pulsed lavage therapy - wound location Rt great toe   Pulsed Lavage with Suction (psi) 4 psi   Pulsed Lavage with Suction - Normal Saline Used 1000 mL   Pulsed Lavage Tip Tip with splash shield   Selective Debridement - Location Rt great toe   Selective Debridement - Tools Used Forceps;Scalpel;Scissors   Selective Debridement - Tissue Removed Slough and calloused perimeter   Wound Therapy - Clinical Statement PT comes today with medical shoe on Rt foot with toe bandaged with conform.  Continues to present with significant deepness with thick callous perimeter of wound.  Pulse lavage completed and wound debrided removing slough and callous from perimeter.  Patient is to check with MD about getting diabetic shoes.  Insole ordered to help  off load wound in medical shoe.  If this does not work my cut our pressure relief in bottom of shoe.    Wound Therapy - Functional Problem List difficulty walking due to pain   Factors Delaying/Impairing Wound Healing Diabetes Mellitus;Vascular compromise   Hydrotherapy Plan Dressing change;Debridement;Pulsatile lavage with suction   Wound Plan Wound care to continue pulsed lavage for removal of deep slough in wound as well as contined selective debridment for dead tissue preventing closue of wound. Check to see if patient has inquired about diabetic shoe and give offloading shoe or insole if needed.     Dressing  Gauze, silver hydrofiber   Decrease Necrotic Tissue to 0%   Increase Granulation Tissue to 100%   Decrease  Length/Width/Depth by (cm) .8x.5x.7   Improve Drainage Characteristics Mod   Patient/Family will be able to  walk without pain                              Problem List Patient Active Problem List   Diagnosis Date Noted  . Weakness of right arm/hand 09/18/2013  . Unstable balance 08/11/2013  . Foot and toe(s), blister, infected 08/11/2013  . Difficulty in walking(719.7) 08/11/2013  . Weakness of right leg 08/11/2013  . Acute ischemic stroke 08/03/2013  . Hypokalemia 08/03/2013  . Diabetes mellitus without complication   . Hypertension   . Neuropathy   . Chronic back pain   . Psoriasis   . Wound, open, foot     Teena Irani, PTA/CLT [phone] 03/12/2015, 3:10 PM  [place] [address] [place], [place], [address] Phone: [phone]   Fax:  [phone]

## 2015-03-14 ENCOUNTER — Other Ambulatory Visit (HOSPITAL_COMMUNITY): Payer: Self-pay | Admitting: Internal Medicine

## 2015-03-14 ENCOUNTER — Ambulatory Visit (HOSPITAL_COMMUNITY): Payer: 59 | Attending: Internal Medicine

## 2015-03-14 DIAGNOSIS — E11621 Type 2 diabetes mellitus with foot ulcer: Secondary | ICD-10-CM | POA: Diagnosis not present

## 2015-03-14 DIAGNOSIS — L97519 Non-pressure chronic ulcer of other part of right foot with unspecified severity: Secondary | ICD-10-CM | POA: Diagnosis not present

## 2015-03-14 DIAGNOSIS — I779 Disorder of arteries and arterioles, unspecified: Secondary | ICD-10-CM

## 2015-03-14 DIAGNOSIS — S91109A Unspecified open wound of unspecified toe(s) without damage to nail, initial encounter: Secondary | ICD-10-CM

## 2015-03-14 NOTE — Therapy (Signed)
[place], [place], [address] Phone: [phone]   Fax:  [phone]  Wound Care Therapy  Patient Details  Name: Madison Reed MRN: [id number] Date of Birth: 07/08/1951 Referring Provider:  Redmond School, MD  Encounter Date: 03/14/2015      PT End of Session - 03/14/15 1333    Visit Number 4   Number of Visits 16   Date for PT Re-Evaluation 04/04/15   Authorization Type BCBS   Authorization - Visit Number 4   Authorization - Number of Visits 16   PT Start Time 5053   PT Stop Time 1228   PT Time Calculation (min) 40 min   Activity Tolerance Patient tolerated treatment well   Behavior During Therapy [facility] for tasks assessed/performed      Past Medical History  Diagnosis Date  . Diabetes mellitus without complication   . Hypertension   . Neuropathy   . Chronic back pain   . Bone spur   . Wound, open, foot   . Psoriasis   . Abdominal aneurysm   . Kidney stones     Past Surgical History  Procedure Laterality Date  . Vesicovaginal fistula closure w/ tah    . Abdominal hysterectomy    . Bladder suspension      There were no vitals filed for this visit.  Visit Diagnosis:  Wound, open, toe, initial encounter      Subjective Assessment - 03/14/15 1230    Subjective Pt stated pain very minimal today maybe 1/10, dressings feel tight today   Currently in Pain? Yes   Pain Score 1    Pain Location Toe (Comment which one)  Great toe                   Wound Therapy - 03/14/15 1230    Subjective Pt stated pain very minimal today maybe 1/10, dressings feel tight today  Pt stated she has referral for diabetic shoe, going by MD office today to pick up.   Date of Onset 09/16/14   Prior Treatments Yes MD debridment.    Wound Properties Date First Assessed: 03/05/15 Time First Assessed: 1440 Wound Type: Diabetic ulcer Location: Toe (Comment  which one) , Rt Great Toe  Location Orientation: Right  Wound Description (Comments): Rt inferior great toe wound, smelly,  Present on Admission: Yes   Dressing Type Silver hydrofiber;Gauze (Comment);Compression wrap   Dressing Status New drainage;Intact   Dressing Change Frequency Every 3 days   Site / Wound Assessment Red;Yellow  callous border   % Wound base Red or Granulating 50%   % Wound base Yellow 50%   % Wound base Black 0%   % Wound base Other (Comment) --  callous perimeter of wound   Peri-wound Assessment Maceration  callous   Margins Unattached edges (unapproximated)   Closure None   Drainage Amount Minimal   Drainage Description Serosanguineous   Treatment Hydrotherapy (Pulse lavage);Cleansed;Debridement (Selective)   Pulsed lavage therapy - wound location Rt great toe   Pulsed Lavage with Suction (psi) 4 psi   Pulsed Lavage with Suction - Normal Saline Used 1000 mL   Pulsed Lavage Tip Tip with splash shield   Selective Debridement - Location Rt great toe   Selective Debridement - Tools Used Forceps;Scalpel;Scissors   Selective Debridement - Tissue Removed Slough and calloused perimeter   Wound Therapy - Clinical Statement Noted redness and edema to Rt great toe and metatarsal region with reports  of great toe joint pain during cleaning.  Pt instructed to go to MD or ER today.  Phone call was made to MD directly after treatment.  Pt stated she has been on antibiotics for a week, plans on going to MD office to pick up referral for diabetic shoe and with new redness now.  Treament complete with PLS and debridement for removal of slough and callous surrounding wound to promote healing.  No reports of pain with debridement through session.  Continued with silver hydrofiber, gauze and compression for edema control.   Wound Therapy - Functional Problem List difficulty walking due to pain   Factors Delaying/Impairing Wound Healing Diabetes Mellitus;Vascular compromise   Hydrotherapy Plan Dressing change;Debridement;Pulsatile lavage with  suction   Wound Plan Wound care to continue pulsed lavage for removal of deep slough in wound as well as contined selective debridment for dead tissue preventing closue of wound. Check to see if patient has inquired about diabetic shoe and give offloading shoe or insole if needed.    F/u with MD/ER visit about redness and swelling.     Dressing  Gauze, silver hydrofiber, vaseline around perimeter and coban around great toe         Problem List Patient Active Problem List   Diagnosis Date Noted  . Weakness of right arm/hand 09/18/2013  . Unstable balance 08/11/2013  . Foot and toe(s), blister, infected 08/11/2013  . Difficulty in walking(719.7) 08/11/2013  . Weakness of right leg 08/11/2013  . Acute ischemic stroke 08/03/2013  . Hypokalemia 08/03/2013  . Diabetes mellitus without complication   . Hypertension   . Neuropathy   . Chronic back pain   . Psoriasis   . Wound, open, foot    Aldona Lento, PTA  Aldona Lento 03/14/2015, 1:35 PM  [place] [address] [place], [place], [address] Phone: [phone]   Fax:  [phone]

## 2015-03-19 ENCOUNTER — Ambulatory Visit (HOSPITAL_COMMUNITY): Admission: RE | Admit: 2015-03-19 | Payer: 59 | Source: Ambulatory Visit

## 2015-03-19 ENCOUNTER — Ambulatory Visit (HOSPITAL_COMMUNITY): Payer: 59 | Admitting: Physical Therapy

## 2015-03-19 DIAGNOSIS — R2689 Other abnormalities of gait and mobility: Secondary | ICD-10-CM

## 2015-03-19 DIAGNOSIS — E11621 Type 2 diabetes mellitus with foot ulcer: Secondary | ICD-10-CM | POA: Diagnosis not present

## 2015-03-19 DIAGNOSIS — R29898 Other symptoms and signs involving the musculoskeletal system: Secondary | ICD-10-CM

## 2015-03-19 DIAGNOSIS — S91109A Unspecified open wound of unspecified toe(s) without damage to nail, initial encounter: Secondary | ICD-10-CM

## 2015-03-19 NOTE — Therapy (Signed)
[place], [place], [address] Phone: [phone]   Fax:  [phone]  Physical Therapy Treatment  Patient Details  Name: Madison Reed MRN: [id number] Date of Birth: 1951/09/25 Referring Provider:  Redmond School, MD  Encounter Date: 03/19/2015      PT End of Session - 03/19/15 1420    Visit Number 5   Number of Visits 16   Date for PT Re-Evaluation 04/04/15   Authorization Type BCBS   Authorization - Visit Number 5   Authorization - Number of Visits 16   PT Start Time 1200   PT Stop Time 1240   PT Time Calculation (min) 40 min   Activity Tolerance Patient tolerated treatment well   Behavior During Therapy [facility] for tasks assessed/performed      Past Medical History  Diagnosis Date  . Diabetes mellitus without complication   . Hypertension   . Neuropathy   . Chronic back pain   . Bone spur   . Wound, open, foot   . Psoriasis   . Abdominal aneurysm   . Kidney stones     Past Surgical History  Procedure Laterality Date  . Vesicovaginal fistula closure w/ tah    . Abdominal hysterectomy    . Bladder suspension      There were no vitals filed for this visit.  Visit Diagnosis:  Wound, open, toe, initial encounter  Weakness of right arm/hand  Unstable balance  Weakness of right leg              Wound Therapy - 03/19/15 1405    Subjective Pt reports she went to the MD after leaving her last appointment and sat there for 3-4 hours until seen.  Pt reports MD did not look at her wound but wrote another prescription for additional antibiotic for her foot.  She is now on 2 antibiotics.   Date of Onset 09/16/14   Prior Treatments Yes MD debridment.    Pain Score 1    Wound Properties Date First Assessed: 03/05/15 Time First Assessed: 1440 Wound Type: Diabetic ulcer Location: Toe (Comment  which one) , Rt Great Toe  Location Orientation: Right Wound Description (Comments): Rt inferior great toe  wound, smelly,  Present on Admission: Yes   Dressing Type Silver hydrofiber;Gauze (Comment);Compression wrap   Dressing Status New drainage;Intact   Dressing Change Frequency Every 3 days   Site / Wound Assessment Red;Yellow  callous border   % Wound base Red or Granulating 40%   % Wound base Yellow 60%   % Wound base Black 0%   % Wound base Other (Comment) --  callous perimeter of wound   Peri-wound Assessment Maceration  callous   Tunneling (cm) to other side and inside all way through.   Margins Unattached edges (unapproximated)   Closure None   Drainage Amount Minimal   Drainage Description Serosanguineous   Treatment Hydrotherapy (Pulse lavage);Cleansed;Debridement (Selective)   Pulsed lavage therapy - wound location Rt great toe   Pulsed Lavage with Suction (psi) 4 psi   Pulsed Lavage with Suction - Normal Saline Used 1000 mL   Pulsed Lavage Tip Tip with splash shield   Selective Debridement - Location Rt great toe   Selective Debridement - Tools Used Forceps;Scalpel;Scissors   Selective Debridement - Tissue Removed Slough and calloused perimeter   Wound Therapy - Clinical Statement Overall worse since last session.  Wound is now tunnelled all way to dorsal surface and on medial  side facing second toe.  Also noted was animal hair inside and around plantar surface of the wound.  serosanguinous drainage expressed with palpation out of all openings.  Wound without odor, however very red with heat perimeter of dorsal surface and stange discoloration on dorsal great toe, yellowish and mottled.   Ihor Austin, PTA contacted MD office regarding concerns, however MD unavailable and at lunch.  Message left regarding concerns and need for wound culture vs Xray due to suspected osteomyelitis.  patient also instructed to go to ED if experiences chills/nausea and /or  fever.  Also instructed to always keep wound covered/protected and not to expose wound when showering.     Wound Therapy -  Functional Problem List difficulty walking due to pain   Factors Delaying/Impairing Wound Healing Diabetes Mellitus;Vascular compromise   Hydrotherapy Plan Dressing change;Debridement;Pulsatile lavage with suction   Wound Plan Wound care to continue pulsed lavage for removal of deep slough in wound as well as contined selective debridment for dead tissue preventing closue of wound.  Follow up with MD; await for further instructions.  If wound does not appear better next visit, have pateint return to MD or go to ED.     Dressing  Gauze, silver hydrofiber, vaseline around perimeter and coban around great toe            Problem List Patient Active Problem List   Diagnosis Date Noted  . Weakness of right arm/hand 09/18/2013  . Unstable balance 08/11/2013  . Foot and toe(s), blister, infected 08/11/2013  . Difficulty in walking(719.7) 08/11/2013  . Weakness of right leg 08/11/2013  . Acute ischemic stroke 08/03/2013  . Hypokalemia 08/03/2013  . Diabetes mellitus without complication   . Hypertension   . Neuropathy   . Chronic back pain   . Psoriasis   . Wound, open, foot    Teena Irani, PTA/CLT [phone] 03/19/2015, 2:21 PM  [facility] [address] [place], [place], [address] Phone: [phone]   Fax:  [phone]

## 2015-03-21 ENCOUNTER — Emergency Department (HOSPITAL_COMMUNITY): Payer: 59

## 2015-03-21 ENCOUNTER — Encounter (HOSPITAL_COMMUNITY): Payer: Self-pay | Admitting: Emergency Medicine

## 2015-03-21 ENCOUNTER — Ambulatory Visit (HOSPITAL_COMMUNITY): Payer: 59

## 2015-03-21 ENCOUNTER — Inpatient Hospital Stay (HOSPITAL_COMMUNITY)
Admission: EM | Admit: 2015-03-21 | Discharge: 2015-03-24 | DRG: 638 | Disposition: A | Discharge status: Home Health | Payer: 59 | Attending: Family Medicine | Admitting: Family Medicine

## 2015-03-21 DIAGNOSIS — Z880 Allergy status to penicillin: Secondary | ICD-10-CM

## 2015-03-21 DIAGNOSIS — L03032 Cellulitis of left toe: Secondary | ICD-10-CM

## 2015-03-21 DIAGNOSIS — E876 Hypokalemia: Secondary | ICD-10-CM

## 2015-03-21 DIAGNOSIS — B952 Enterococcus as the cause of diseases classified elsewhere: Secondary | ICD-10-CM | POA: Diagnosis present

## 2015-03-21 DIAGNOSIS — I10 Essential (primary) hypertension: Secondary | ICD-10-CM | POA: Diagnosis present

## 2015-03-21 DIAGNOSIS — L97519 Non-pressure chronic ulcer of other part of right foot with unspecified severity: Secondary | ICD-10-CM | POA: Diagnosis not present

## 2015-03-21 DIAGNOSIS — E1165 Type 2 diabetes mellitus with hyperglycemia: Secondary | ICD-10-CM | POA: Diagnosis present

## 2015-03-21 DIAGNOSIS — R739 Hyperglycemia, unspecified: Secondary | ICD-10-CM

## 2015-03-21 DIAGNOSIS — Z833 Family history of diabetes mellitus: Secondary | ICD-10-CM | POA: Diagnosis not present

## 2015-03-21 DIAGNOSIS — F17200 Nicotine dependence, unspecified, uncomplicated: Secondary | ICD-10-CM | POA: Diagnosis present

## 2015-03-21 DIAGNOSIS — E1169 Type 2 diabetes mellitus with other specified complication: Secondary | ICD-10-CM | POA: Diagnosis present

## 2015-03-21 DIAGNOSIS — Z8249 Family history of ischemic heart disease and other diseases of the circulatory system: Secondary | ICD-10-CM

## 2015-03-21 DIAGNOSIS — Z8673 Personal history of transient ischemic attack (TIA), and cerebral infarction without residual deficits: Secondary | ICD-10-CM

## 2015-03-21 DIAGNOSIS — L409 Psoriasis, unspecified: Secondary | ICD-10-CM | POA: Diagnosis present

## 2015-03-21 DIAGNOSIS — L97529 Non-pressure chronic ulcer of other part of left foot with unspecified severity: Secondary | ICD-10-CM | POA: Diagnosis present

## 2015-03-21 DIAGNOSIS — E669 Obesity, unspecified: Secondary | ICD-10-CM | POA: Diagnosis present

## 2015-03-21 DIAGNOSIS — M009 Pyogenic arthritis, unspecified: Secondary | ICD-10-CM | POA: Diagnosis present

## 2015-03-21 DIAGNOSIS — M869 Osteomyelitis, unspecified: Secondary | ICD-10-CM | POA: Diagnosis present

## 2015-03-21 DIAGNOSIS — M00872 Arthritis due to other bacteria, left ankle and foot: Secondary | ICD-10-CM | POA: Diagnosis not present

## 2015-03-21 DIAGNOSIS — R29898 Other symptoms and signs involving the musculoskeletal system: Secondary | ICD-10-CM

## 2015-03-21 DIAGNOSIS — L03116 Cellulitis of left lower limb: Secondary | ICD-10-CM

## 2015-03-21 DIAGNOSIS — E11622 Type 2 diabetes mellitus with other skin ulcer: Secondary | ICD-10-CM

## 2015-03-21 DIAGNOSIS — L97909 Non-pressure chronic ulcer of unspecified part of unspecified lower leg with unspecified severity: Secondary | ICD-10-CM

## 2015-03-21 DIAGNOSIS — M7989 Other specified soft tissue disorders: Secondary | ICD-10-CM | POA: Diagnosis present

## 2015-03-21 DIAGNOSIS — IMO0002 Reserved for concepts with insufficient information to code with codable children: Secondary | ICD-10-CM

## 2015-03-21 DIAGNOSIS — E11621 Type 2 diabetes mellitus with foot ulcer: Secondary | ICD-10-CM | POA: Diagnosis present

## 2015-03-21 DIAGNOSIS — G629 Polyneuropathy, unspecified: Secondary | ICD-10-CM

## 2015-03-21 DIAGNOSIS — L97509 Non-pressure chronic ulcer of other part of unspecified foot with unspecified severity: Secondary | ICD-10-CM

## 2015-03-21 DIAGNOSIS — L03039 Cellulitis of unspecified toe: Secondary | ICD-10-CM | POA: Insufficient documentation

## 2015-03-21 DIAGNOSIS — R2689 Other abnormalities of gait and mobility: Secondary | ICD-10-CM

## 2015-03-21 DIAGNOSIS — E1142 Type 2 diabetes mellitus with diabetic polyneuropathy: Secondary | ICD-10-CM | POA: Diagnosis present

## 2015-03-21 DIAGNOSIS — S91109A Unspecified open wound of unspecified toe(s) without damage to nail, initial encounter: Secondary | ICD-10-CM

## 2015-03-21 HISTORY — DX: Cerebral infarction, unspecified: I63.9

## 2015-03-21 LAB — BASIC METABOLIC PANEL
Anion gap: 10 (ref 5–15)
BUN: 15 mg/dL (ref 6–20)
CO2: 32 mmol/L (ref 22–32)
Calcium: 8.8 mg/dL — ABNORMAL LOW (ref 8.9–10.3)
Chloride: 92 mmol/L — ABNORMAL LOW (ref 101–111)
Creatinine, Ser: 1.09 mg/dL — ABNORMAL HIGH (ref 0.44–1.00)
GFR calc Af Amer: >60 mL/min (ref >60)
GFR calc non Af Amer: 54 mL/min — ABNORMAL LOW (ref >60)
Glucose, Bld: 334 mg/dL — ABNORMAL HIGH (ref 65–99)
POTASSIUM: 3 mmol/L — AB (ref 3.5–5.1)
Sodium: 134 mmol/L — ABNORMAL LOW (ref 135–145)

## 2015-03-21 LAB — CBC WITH DIFFERENTIAL/PLATELET
BASOS PCT: 0 % (ref 0–1)
Basophils Absolute: 0.1 10*3/uL (ref 0.0–0.1)
EOS ABS: 0.2 10*3/uL (ref 0.0–0.7)
Eosinophils Relative: 1 % (ref 0–5)
HCT: 39.7 % (ref 36.0–46.0)
Hemoglobin: 13.4 g/dL (ref 12.0–15.0)
LYMPHS ABS: 1.6 10*3/uL (ref 0.7–4.0)
Lymphocytes Relative: 15 % (ref 12–46)
MCH: 29.6 pg (ref 26.0–34.0)
MCHC: 33.8 g/dL (ref 30.0–36.0)
MCV: 87.8 fL (ref 78.0–100.0)
MONO ABS: 0.7 10*3/uL (ref 0.1–1.0)
MONOS PCT: 6 % (ref 3–12)
Neutro Abs: 8.7 10*3/uL — ABNORMAL HIGH (ref 1.7–7.7)
Neutrophils Relative %: 78 % — ABNORMAL HIGH (ref 43–77)
Platelets: 287 10*3/uL (ref 150–400)
RBC: 4.52 MIL/uL (ref 3.87–5.11)
RDW: 15.2 % (ref 11.5–15.5)
WBC: 11.2 10*3/uL — ABNORMAL HIGH (ref 4.0–10.5)

## 2015-03-21 LAB — SEDIMENTATION RATE: Sed Rate: 87 mm/hr — ABNORMAL HIGH (ref 0–22)

## 2015-03-21 MED ORDER — CLINDAMYCIN PHOSPHATE 600 MG/50ML IV SOLN
600.0000 mg | Freq: Once | INTRAVENOUS | Status: AC
  Administered 2015-03-21: 600 mg via INTRAVENOUS
  Filled 2015-03-21: qty 50

## 2015-03-21 MED ORDER — SODIUM CHLORIDE 0.9 % IV SOLN
INTRAVENOUS | Status: DC
  Administered 2015-03-21: 21:00:00 via INTRAVENOUS

## 2015-03-21 MED ORDER — CIPROFLOXACIN IN D5W 400 MG/200ML IV SOLN
400.0000 mg | Freq: Once | INTRAVENOUS | Status: AC
  Administered 2015-03-22: 400 mg via INTRAVENOUS
  Filled 2015-03-21: qty 200

## 2015-03-21 MED ORDER — INSULIN ASPART 100 UNIT/ML ~~LOC~~ SOLN
8.0000 [IU] | Freq: Once | SUBCUTANEOUS | Status: AC
  Administered 2015-03-22: 8 [IU] via SUBCUTANEOUS

## 2015-03-21 MED ORDER — INSULIN ASPART 100 UNIT/ML ~~LOC~~ SOLN
0.0000 [IU] | SUBCUTANEOUS | Status: DC
  Administered 2015-03-22: 5 [IU] via SUBCUTANEOUS
  Administered 2015-03-22: 11 [IU] via SUBCUTANEOUS
  Administered 2015-03-22: 5 [IU] via SUBCUTANEOUS

## 2015-03-21 MED ORDER — ASPIRIN 325 MG PO TABS
325.0000 mg | ORAL_TABLET | Freq: Every day | ORAL | Status: DC
  Administered 2015-03-22 – 2015-03-24 (×3): 325 mg via ORAL
  Filled 2015-03-21 (×3): qty 1

## 2015-03-21 MED ORDER — LOSARTAN POTASSIUM 50 MG PO TABS
100.0000 mg | ORAL_TABLET | Freq: Every day | ORAL | Status: DC
  Administered 2015-03-22 – 2015-03-24 (×3): 100 mg via ORAL
  Filled 2015-03-21 (×6): qty 2

## 2015-03-21 MED ORDER — POTASSIUM CHLORIDE CRYS ER 20 MEQ PO TBCR
40.0000 meq | EXTENDED_RELEASE_TABLET | Freq: Once | ORAL | Status: AC
  Administered 2015-03-22: 40 meq via ORAL
  Filled 2015-03-21: qty 2

## 2015-03-21 MED ORDER — METRONIDAZOLE IN NACL 5-0.79 MG/ML-% IV SOLN
500.0000 mg | Freq: Three times a day (TID) | INTRAVENOUS | Status: DC
  Administered 2015-03-22 – 2015-03-24 (×8): 500 mg via INTRAVENOUS
  Filled 2015-03-21 (×8): qty 100

## 2015-03-21 MED ORDER — HEPARIN SODIUM (PORCINE) 5000 UNIT/ML IJ SOLN
5000.0000 [IU] | Freq: Three times a day (TID) | INTRAMUSCULAR | Status: DC
  Administered 2015-03-22 – 2015-03-24 (×9): 5000 [IU] via SUBCUTANEOUS
  Filled 2015-03-21 (×8): qty 1

## 2015-03-21 MED ORDER — GABAPENTIN 100 MG PO CAPS
200.0000 mg | ORAL_CAPSULE | Freq: Three times a day (TID) | ORAL | Status: DC
  Administered 2015-03-21 – 2015-03-24 (×9): 200 mg via ORAL
  Filled 2015-03-21 (×9): qty 2

## 2015-03-21 MED ORDER — DEXTROSE 5 % IV SOLN
2.0000 g | Freq: Three times a day (TID) | INTRAVENOUS | Status: DC
  Administered 2015-03-22: 2 g via INTRAVENOUS
  Filled 2015-03-21: qty 2

## 2015-03-21 MED ORDER — POTASSIUM CHLORIDE IN NACL 20-0.9 MEQ/L-% IV SOLN
INTRAVENOUS | Status: DC
  Administered 2015-03-22: 01:00:00 via INTRAVENOUS

## 2015-03-21 NOTE — Therapy (Signed)
[place], [place], [address] Phone: [phone]   Fax:  [phone]  Wound Care Therapy  Patient Details  Name: Madison Reed MRN: [id number] Date of Birth: 10-15-1950 Referring Provider:  Elsie Lincoln, MD  Encounter Date: 03/21/2015      PT End of Session - 03/21/15 1449    Visit Number 6   Number of Visits 16   Date for PT Re-Evaluation 04/04/15   Authorization Type BCBS   Authorization - Visit Number 6   Authorization - Number of Visits 16   PT Start Time 1350   PT Stop Time 1432   PT Time Calculation (min) 42 min   Activity Tolerance Patient tolerated treatment well   Behavior During Therapy [facility] for tasks assessed/performed      Past Medical History  Diagnosis Date  . Diabetes mellitus without complication   . Hypertension   . Neuropathy   . Chronic back pain   . Bone spur   . Wound, open, foot   . Psoriasis   . Abdominal aneurysm   . Kidney stones     Past Surgical History  Procedure Laterality Date  . Vesicovaginal fistula closure w/ tah    . Abdominal hysterectomy    . Bladder suspension      There were no vitals filed for this visit.  Visit Diagnosis:  Wound, open, toe, initial encounter  Weakness of right arm/hand  Unstable balance  Weakness of right leg      Subjective Assessment - 03/21/15 1438    Subjective Pain free today, stated a little soreness on bottom of foot when walking.   Currently in Pain? No/denies                   Wound Therapy - 03/21/15 1438    Subjective Pain free today, stated a little soreness on bottom of foot when walking.   Date of Onset 09/16/14   Prior Treatments Yes MD debridment.    Pain Assessment No/denies pain   Wound Properties Date First Assessed: 03/05/15 Time First Assessed: 1440 Wound Type: Diabetic ulcer Location: Toe (Comment  which one) , Rt Great Toe  Location Orientation: Right Wound Description (Comments): Rt  inferior great toe wound, smelly,  Present on Admission: Yes   Dressing Type Silver hydrofiber;Gauze (Comment);Compression wrap   Dressing Status New drainage;Intact   Dressing Change Frequency Every 3 days   Site / Wound Assessment Red;Yellow   % Wound base Red or Granulating 40%   % Wound base Yellow 60%   % Wound base Black 0%   % Wound base Other (Comment) --  callous on perimeter of wound   Peri-wound Assessment Maceration  callous   Tunneling (cm) to other side and all the way through   Margins Unattached edges (unapproximated)   Closure None   Drainage Amount Moderate   Drainage Description Serosanguineous   Treatment Hydrotherapy (Pulse lavage);Cleansed;Debridement (Selective)   Pulsed lavage therapy - wound location Rt great toe   Pulsed Lavage with Suction (psi) 4 psi   Pulsed Lavage with Suction - Normal Saline Used 1000 mL   Pulsed Lavage Tip Tip with splash shield   Selective Debridement - Location Rt great toe   Selective Debridement - Tools Used Forceps;Scalpel;Scissors   Selective Debridement - Tissue Removed Slough and calloused perimeter   Wound Therapy - Clinical Statement Decreased redness however integrity of wound and surrounding tissue without improvements.  Dorsal great toe with  bogginess and when palpated drainage increased from all openings on plantar and dorsal surface.  Recommend pt return to MDs office or go to ER.  No reports of pain through session.     Wound Therapy - Functional Problem List difficulty walking due to pain   Factors Delaying/Impairing Wound Healing Diabetes Mellitus;Vascular compromise   Hydrotherapy Plan Dressing change;Debridement;Pulsatile lavage with suction   Wound Plan Wound care to continue pulsed lavage for removal of deep slough in wound as well as contined selective debridment for dead tissue preventing closue of wound.  Follow up with MD; await for further instructions.  If wound does not appear better next visit, have pateint  return to MD or go to ED.     Dressing  Gauze, silver hydrofiber, vaseline around perimeter             Problem List Patient Active Problem List   Diagnosis Date Noted  . Weakness of right arm/hand 09/18/2013  . Unstable balance 08/11/2013  . Foot and toe(s), blister, infected 08/11/2013  . Difficulty in walking(719.7) 08/11/2013  . Weakness of right leg 08/11/2013  . Acute ischemic stroke 08/03/2013  . Hypokalemia 08/03/2013  . Diabetes mellitus without complication   . Hypertension   . Neuropathy   . Chronic back pain   . Psoriasis   . Wound, open, foot    Aldona Lento, PTA  Aldona Lento 03/21/2015, 2:50 PM  [facility] [address] [place], [place], [address] Phone: [phone]   Fax:  [phone]

## 2015-03-21 NOTE — ED Provider Notes (Signed)
CSN: [id number]     Arrival date & time 03/21/15  2016 History  This chart was scribed for Madison Rank, MD by Molli Posey, ED Scribe. This patient was seen in room APA15/APA15 and the patient's care was started 8:38 PM.   Chief Complaint  Patient presents with  . Diabetic Ulcer   The history is provided by the patient. No language interpreter was used.   HPI Comments: Madison Reed is a 64 y.o. female with a hx of DM, HTN and neuropathy who presents to the Emergency Department complaining of a worsening ulcer on her left great toe for the last several months. Pt states she was seeing Dr. Caprice Beaver for her wound care and states he was redressing the wound. Pt states she was not satisfied with that care plan so she then went to see Dr. Gerarda Fraction who referred her to the wound care center. She states that after wound care put on coban the wound began to blister. She states that the blister has been worsening since that time and says that Dr. Gerarda Fraction prescribed her Cipro first then Clindamycin. She states she has been taking Abx for the last week. Pt reports associated swelling, draining and redness to the area. Pt states that she went to see Dr. Hilma Favors today who advised her to present to ED. She reports no alleviating factors at this time.    Past Medical History  Diagnosis Date  . Diabetes mellitus without complication   . Hypertension   . Neuropathy   . Chronic back pain   . Bone spur   . Wound, open, foot   . Psoriasis   . Abdominal aneurysm   . Kidney stones    Past Surgical History  Procedure Laterality Date  . Vesicovaginal fistula closure w/ tah    . Abdominal hysterectomy    . Bladder suspension     Family History  Problem Relation Age of Onset  . Aneurysm Mother   . Diabetes Mother   . Hypertension Mother    History  Substance Use Topics  . Smoking status: Former Smoker -- 0.00 packs/day for 30 years  . Smokeless tobacco: Never Used  . Alcohol Use: No   OB History     Gravida Para Term Preterm AB TAB SAB Ectopic Multiple Living   2 2 2       2      Review of Systems  Skin: Positive for color change and wound.  All other systems reviewed and are negative.  Allergies  Penicillins; Codeine; Hydrocodone-acetaminophen; Shellfish allergy; and Sulfa antibiotics  Home Medications   Prior to Admission medications   Medication Sig Start Date End Date Taking? Authorizing Provider  aspirin 325 MG tablet Take 1 tablet (325 mg total) by mouth daily. 08/04/13   Radene Gunning, NP  ciprofloxacin (CIPRO) 500 MG tablet Take 1 tablet by mouth 2 (two) times daily. 07/31/13   Historical Provider, MD  DiphenhydrAMINE HCl (BENADRYL PO) Take 1 tablet by mouth daily as needed (allergic reaction).    Historical Provider, MD  gabapentin (NEURONTIN) 100 MG capsule Take 2 capsules by mouth 3 (three) times daily. 07/31/13   Historical Provider, MD  losartan (COZAAR) 100 MG tablet Take 1 tablet by mouth daily. 07/31/13   Historical Provider, MD  simvastatin (ZOCOR) 20 MG tablet Take 1 tablet (20 mg total) by mouth daily at 6 PM. 08/04/13   Radene Gunning, NP  SitaGLIPtin-MetFORMIN HCl (JANUMET PO) Take 1 tablet by mouth 2 (two) times  daily.    Historical Provider, MD   BP 147/64 mmHg  Pulse 88  Temp(Src) 98.3 F (36.8 C) (Oral)  Resp 16  Ht 5\' 5"  (1.651 m)  Wt 190 lb (86.183 kg)  BMI 31.62 kg/m2  SpO2 99%   Physical Exam  Constitutional: She appears well-developed and well-nourished. No distress.  HENT:  Head: Normocephalic and atraumatic.  Right Ear: External ear normal.  Left Ear: External ear normal.  Eyes: Conjunctivae are normal. Right eye exhibits no discharge. Left eye exhibits no discharge. No scleral icterus.  Neck: Neck supple. No tracheal deviation present.  Cardiovascular: Normal rate.   Pulmonary/Chest: Effort normal. No stridor. No respiratory distress.  Musculoskeletal: She exhibits no edema.  Ulcerated lesion on dorsal aspect of the big toe. Drainage  from the ulceration. Erythema extending up towards the mid foot. TTP. Strong dorsalis pedis pulses.   Neurological: She is alert. Cranial nerve deficit: no gross deficits.  Skin: Skin is warm and dry. No rash noted.  Psychiatric: She has a normal mood and affect.  Nursing note and vitals reviewed.   ED Course  Procedures   DIAGNOSTIC STUDIES: Oxygen Saturation is 99% on RA, normal by my interpretation.    COORDINATION OF CARE: 8:47 PM Discussed treatment plan with pt at bedside and pt agreed to plan.   Labs Review Labs Reviewed  CBC WITH DIFFERENTIAL/PLATELET - Abnormal; Notable for the following:    WBC 11.2 (*)    Neutrophils Relative % 78 (*)    Neutro Abs 8.7 (*)    All other components within normal limits  BASIC METABOLIC PANEL - Abnormal; Notable for the following:    Sodium 134 (*)    Potassium 3.0 (*)    Chloride 92 (*)    Glucose, Bld 334 (*)    Creatinine, Ser 1.09 (*)    Calcium 8.8 (*)    GFR calc non Af Amer 54 (*)    All other components within normal limits  WOUND CULTURE  SEDIMENTATION RATE    Imaging Review Dg Foot Complete Left  03/21/2015   CLINICAL DATA:  64 year old female with a history of ulcer left great toe.  EXAM: LEFT FOOT - COMPLETE 3+ VIEW  COMPARISON:  None.  FINDINGS: No acute bony abnormality. Posttraumatic deformity, now healed, of second and third metatarsals. Mild degenerative changes of the interphalangeal joints of the toes and of the midfoot.  Soft tissue swelling at the left great toe. No subcutaneous gas. No radiopaque foreign body.  Degenerative changes of the hindfoot.  Calcifications of the posterior tibial artery.  IMPRESSION: No acute bony abnormality identified.  Soft tissue swelling of the left great toe, without bony changes to suggest osteomyelitis on the study.  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 If the patient has not yet been evaluated for  claudication/CLI, noninvasive testing with ABI, segmental duplex, and segmental pulse volume recording may be considered as well as office based evaluation.  Signed,  Dulcy Fanny. Earleen Newport, DO  [facility]  [facility]   Electronically Signed   By: Corrie Mckusick D.O.   On: 03/21/2015 21:35    Medications  0.9 %  sodium chloride infusion ( Intravenous New Bag/Given 03/21/15 2112)  ciprofloxacin (CIPRO) IVPB 400 mg (not administered)  clindamycin (CLEOCIN) IVPB 600 mg (not administered)     MDM   Final diagnoses:  Cellulitis of left lower extremity  Hyperglycemia   Pt has cellulitis associated with  chronic wound on foot.  She has a good pulse on exam.  Doubt acute vascular issue.  No osteo noted on plain film.  Has been on outpatient oral abx but not responding.  Will add start iv abx.  Consult with hospitalist for admission.  I personally performed the services described in this documentation, which was scribed in my presence.  The recorded information has been reviewed and is accurate.     Madison Rank, MD 03/21/15 2232

## 2015-03-21 NOTE — ED Notes (Signed)
Pt. Reports being sent here by Dr. Hilma Favors for evaluation of foot ulcer. Pt. Reports blistered area to top and bottom of left big toe.

## 2015-03-21 NOTE — H&P (Signed)
Hospitalist Admission History and Physical  Patient name: Madison Reed Medical record number: [id number] Date of birth: 24-Sep-1951 Age: 65 y.o. Gender: female  Primary Care Provider: Glo Herring., MD  Chief Complaint: diabetic foot ulcer  History of Present Illness:This is a 64 y.o. year old female with significant past medical history of HTN, NIDDM, chronic L foot wound presenting with diabetic foot ulcer. Pt reports progressively worsening L great toe wound over past 1-2 weeks. Has been followed by outpt wound care locally. States that she had some worsening drainage at wound care. Was referred to PCP and placed on cipro. Pt states that she had worsening drainage at follow up and then placed on clindamycin in combination w/ cipro. Pt still with worsening sxs. Was subsequently sent to ER for further evaluation. Presented to ER afebrile, hemodynamically stable. WBC 11.2, Cr 1.09, K 3.0, Glu 334, ESR 84. Foot xray preliminarily negative for osteo.   Assessment and Plan: SEQUITA WISE is a 64 y.o. year old female presenting with diabetic foot ulcer    Active Problems:   Diabetic foot ulcer   1- Diabetic foot ulcer -start on aztreonam and flagyl per order set protocol  -blood cultures -elevated ESR concerning for possible osteo -will obtain MRI  -follow   2-NIDDM -elevated sugars in setting of above -SSI -A1C  -hold orals -follow   3-HTN -BP stable  -cont home regimen  -follow  FEN/GI: heart healthy, carb modified diet  Prophylaxis: sub q heparin  Disposition: pending further evaluation Code Status:Full Code    Patient Active Problem List   Diagnosis Date Noted  . Diabetic foot ulcer 03/21/2015  . Weakness of right arm/hand 09/18/2013  . Unstable balance 08/11/2013  . Foot and toe(s), blister, infected 08/11/2013  . Difficulty in walking(719.7) 08/11/2013  . Weakness of right leg 08/11/2013  . Acute ischemic stroke 08/03/2013  . Hypokalemia 08/03/2013  .  Diabetes mellitus without complication   . Hypertension   . Neuropathy   . Chronic back pain   . Psoriasis   . Wound, open, foot    Past Medical History: Past Medical History  Diagnosis Date  . Diabetes mellitus without complication   . Hypertension   . Neuropathy   . Chronic back pain   . Bone spur   . Wound, open, foot   . Psoriasis   . Abdominal aneurysm   . Kidney stones     Past Surgical History: Past Surgical History  Procedure Laterality Date  . Vesicovaginal fistula closure w/ tah    . Abdominal hysterectomy    . Bladder suspension      Social History: History   Social History  . Marital Status: Married    Spouse Name: N/A  . Number of Children: N/A  . Years of Education: N/A   Social History Main Topics  . Smoking status: Former Smoker -- 0.00 packs/day for 30 years  . Smokeless tobacco: Never Used  . Alcohol Use: No  . Drug Use: No  . Sexual Activity: Not on file   Other Topics Concern  . None   Social History Narrative    Family History: Family History  Problem Relation Age of Onset  . Aneurysm Mother   . Diabetes Mother   . Hypertension Mother     Allergies: Allergies  Allergen Reactions  . Penicillins Anaphylaxis  . Codeine Itching  . Hydrocodone-Acetaminophen Itching  . Shellfish Allergy Itching  . Sulfa Antibiotics Itching and Rash    Current Facility-Administered Medications  Medication Dose Route Frequency Provider Last Rate Last Dose  . 0.9 %  sodium chloride infusion   Intravenous Continuous Dorie Rank, MD 125 mL/hr at 03/21/15 2112    . 0.9 % NaCl with KCl 20 mEq/ L  infusion   Intravenous Continuous Deneise Lever, MD      . Derrill Memo ON 03/22/2015] aspirin tablet 325 mg  325 mg Oral Daily Deneise Lever, MD      . aztreonam (AZACTAM) 2 g in dextrose 5 % 50 mL IVPB  2 g Intravenous 3 times per day Dorie Rank, MD      . ciprofloxacin (CIPRO) IVPB 400 mg  400 mg Intravenous Once Dorie Rank, MD      . gabapentin (NEURONTIN)  capsule 200 mg  200 mg Oral TID Dorie Rank, MD   200 mg at 03/21/15 2243  . heparin injection 5,000 Units  5,000 Units Subcutaneous 3 times per day Deneise Lever, MD      . Derrill Memo ON 03/22/2015] insulin aspart (novoLOG) injection 0-15 Units  0-15 Units Subcutaneous 6 times per day Deneise Lever, MD      . insulin aspart (novoLOG) injection 8 Units  8 Units Subcutaneous Once Dorie Rank, MD      . Derrill Memo ON 03/22/2015] losartan (COZAAR) tablet 100 mg  100 mg Oral Daily Deneise Lever, MD      . metroNIDAZOLE (FLAGYL) IVPB 500 mg  500 mg Intravenous Q8H Deneise Lever, MD      . potassium chloride SA (K-DUR,KLOR-CON) CR tablet 40 mEq  40 mEq Oral Once Dorie Rank, MD       Current Outpatient Prescriptions  Medication Sig Dispense Refill  . aspirin 325 MG tablet Take 1 tablet (325 mg total) by mouth daily.    . ciprofloxacin (CIPRO) 500 MG tablet Take 1 tablet by mouth 2 (two) times daily.    Marland Kitchen CLINDAMYCIN HCL PO Take 1 capsule by mouth 3 (three) times daily.    Marland Kitchen gabapentin (NEURONTIN) 100 MG capsule Take 2 capsules by mouth 3 (three) times daily.    Marland Kitchen glipiZIDE (GLUCOTROL) 10 MG tablet Take 10 mg by mouth 2 (two) times daily before a meal.    . HYDROcodone-acetaminophen (NORCO) 10-325 MG per tablet Take 1 tablet by mouth every 6 (six) hours as needed (pain).    Marland Kitchen losartan (COZAAR) 100 MG tablet Take 1 tablet by mouth daily.    . DiphenhydrAMINE HCl (BENADRYL PO) Take 1 tablet by mouth daily as needed (allergic reaction).    . simvastatin (ZOCOR) 20 MG tablet Take 1 tablet (20 mg total) by mouth daily at 6 PM. (Patient not taking: Reported on 03/21/2015) 30 tablet 1   Review Of Systems: Complete ROS negative except as noted above in HPI.  Physical Exam: Filed Vitals:   03/21/15 2306  BP: 203/70  Pulse: 84  Temp:   Resp: 20    General: alert and cooperative HEENT: PERRLA and extra ocular movement intact Heart: S1, S2 normal, no murmur, rub or gallop, regular rate and rhythm Lungs: clear  to auscultation, no wheezes or rales and unlabored breathing Abdomen: abdomen is soft without significant tenderness, masses, organomegaly or guarding Extremities: L great toe w/ acive erythema and purulent drainage on plantar and dorsal aspects of foot, ? bony exposure , distal pulses present,  Skin:as above  Neurology: normal without focal findings, mental status, speech normal, alert and oriented x3 and + distal neuropathy  Psych: mildly agitated but conversive  Labs and Imaging: Lab Results  Component Value Date/Time   NA 134* 03/21/2015 09:08 PM   K 3.0* 03/21/2015 09:08 PM   CL 92* 03/21/2015 09:08 PM   CO2 32 03/21/2015 09:08 PM   BUN 15 03/21/2015 09:08 PM   CREATININE 1.09* 03/21/2015 09:08 PM   GLUCOSE 334* 03/21/2015 09:08 PM   Lab Results  Component Value Date   WBC 11.2* 03/21/2015   HGB 13.4 03/21/2015   HCT 39.7 03/21/2015   MCV 87.8 03/21/2015   PLT 287 03/21/2015    Dg Foot Complete Left  03/21/2015   CLINICAL DATA:  64 year old female with a history of ulcer left great toe.  EXAM: LEFT FOOT - COMPLETE 3+ VIEW  COMPARISON:  None.  FINDINGS: No acute bony abnormality. Posttraumatic deformity, now healed, of second and third metatarsals. Mild degenerative changes of the interphalangeal joints of the toes and of the midfoot.  Soft tissue swelling at the left great toe. No subcutaneous gas. No radiopaque foreign body.  Degenerative changes of the hindfoot.  Calcifications of the posterior tibial artery.  IMPRESSION: No acute bony abnormality identified.  Soft tissue swelling of the left great toe, without bony changes to suggest osteomyelitis on the study.  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 If the patient has not yet been evaluated for claudication/CLI, noninvasive testing with ABI, segmental duplex, and segmental pulse volume recording may be considered as well as office based evaluation.  Signed,   Dulcy Fanny. Earleen Newport, DO  [facility]  [facility]   Electronically Signed   By: Corrie Mckusick D.O.   On: 03/21/2015 21:35        Greater than 50% of greater than 70 minutes spent with patient in terms of direct patient care and/or care coordination.   Imaging studies and EKG readings personally reviewed    Shanda Howells MD  Pager: [phone]

## 2015-03-21 NOTE — Progress Notes (Signed)
ANTIBIOTIC CONSULT NOTE-Preliminary  Pharmacy Consult for aztreonam Indication: diabetic foot infection  Allergies  Allergen Reactions  . Penicillins Anaphylaxis  . Codeine Itching  . Hydrocodone-Acetaminophen Itching  . Shellfish Allergy Itching  . Sulfa Antibiotics Itching and Rash    Patient Measurements: Height: 5\' 5"  (165.1 cm) Weight: 190 lb (86.183 kg) IBW/kg (Calculated) : 57   Vital Signs: Temp: 98.3 F (36.8 C) (06/09 2022) Temp Source: Oral (06/09 2022) BP: 203/70 mmHg (06/09 2306) Pulse Rate: 84 (06/09 2306)  Labs:  Recent Labs  03/21/15 2108  WBC 11.2*  HGB 13.4  PLT 287  CREATININE 1.09*    Estimated Creatinine Clearance: 58.8 mL/min (by C-G formula based on Cr of 1.09).  No results for input(s): VANCOTROUGH, VANCOPEAK, VANCORANDOM, GENTTROUGH, GENTPEAK, GENTRANDOM, TOBRATROUGH, TOBRAPEAK, TOBRARND, AMIKACINPEAK, AMIKACINTROU, AMIKACIN in the last 72 hours.   Microbiology: No results found for this or any previous visit (from the past 720 hour(s)).  Medical History: Past Medical History  Diagnosis Date  . Diabetes mellitus without complication   . Hypertension   . Neuropathy   . Chronic back pain   . Bone spur   . Wound, open, foot   . Psoriasis   . Abdominal aneurysm   . Kidney stones     Medications:  Scheduled:  . gabapentin  200 mg Oral TID  . [START ON 03/22/2015] insulin aspart  0-15 Units Subcutaneous 6 times per day  . insulin aspart  8 Units Subcutaneous Once  . potassium chloride  40 mEq Oral Once    Assessment: 64 yo female with hx DM, HTN, and neuropathy with ulcer on left great toe that is swollen, draining, and red.   Goal of Therapy:  eradication of infeciton  Plan:  Preliminary review of pertinent patient information completed.  Protocol will be initiated with 2 doses of aztreonam 2 grams Q8.  Forestine Na clinical pharmacist will complete review during morning rounds to assess patient and finalize treatment  regimen.  Nyra Capes, RPH 03/21/2015,11:20 PM

## 2015-03-21 NOTE — ED Notes (Signed)
Pt. Given meal. Hospitalist at bedside.

## 2015-03-22 ENCOUNTER — Encounter (HOSPITAL_COMMUNITY): Payer: Self-pay | Admitting: *Deleted

## 2015-03-22 ENCOUNTER — Inpatient Hospital Stay (HOSPITAL_COMMUNITY): Payer: 59

## 2015-03-22 DIAGNOSIS — L97529 Non-pressure chronic ulcer of other part of left foot with unspecified severity: Secondary | ICD-10-CM

## 2015-03-22 DIAGNOSIS — E11621 Type 2 diabetes mellitus with foot ulcer: Principal | ICD-10-CM

## 2015-03-22 DIAGNOSIS — L03116 Cellulitis of left lower limb: Secondary | ICD-10-CM

## 2015-03-22 LAB — GLUCOSE, CAPILLARY
GLUCOSE-CAPILLARY: 334 mg/dL — AB (ref 65–99)
Glucose-Capillary: 210 mg/dL — ABNORMAL HIGH (ref 65–99)
Glucose-Capillary: 223 mg/dL — ABNORMAL HIGH (ref 65–99)
Glucose-Capillary: 345 mg/dL — ABNORMAL HIGH (ref 65–99)
Glucose-Capillary: 353 mg/dL — ABNORMAL HIGH (ref 65–99)
Glucose-Capillary: 290 mg/dL — ABNORMAL HIGH (ref 65–99)

## 2015-03-22 LAB — CBC WITH DIFFERENTIAL/PLATELET
Basophils Absolute: 0.1 10*3/uL (ref 0.0–0.1)
Basophils Relative: 1 % (ref 0–1)
EOS PCT: 2 % (ref 0–5)
Eosinophils Absolute: 0.2 10*3/uL (ref 0.0–0.7)
HCT: 38 % (ref 36.0–46.0)
HEMOGLOBIN: 12.4 g/dL (ref 12.0–15.0)
LYMPHS PCT: 20 % (ref 12–46)
Lymphs Abs: 2.3 10*3/uL (ref 0.7–4.0)
MCH: 28.7 pg (ref 26.0–34.0)
MCHC: 32.6 g/dL (ref 30.0–36.0)
MCV: 88 fL (ref 78.0–100.0)
Monocytes Absolute: 0.6 10*3/uL (ref 0.1–1.0)
Monocytes Relative: 5 % (ref 3–12)
NEUTROS PCT: 72 % (ref 43–77)
Neutro Abs: 8.4 10*3/uL — ABNORMAL HIGH (ref 1.7–7.7)
Platelets: 301 10*3/uL (ref 150–400)
RBC: 4.32 MIL/uL (ref 3.87–5.11)
RDW: 15.1 % (ref 11.5–15.5)
WBC: 11.6 10*3/uL — ABNORMAL HIGH (ref 4.0–10.5)

## 2015-03-22 LAB — PREALBUMIN: Prealbumin: 12.2 mg/dL — ABNORMAL LOW (ref 18–38)

## 2015-03-22 LAB — COMPREHENSIVE METABOLIC PANEL
ALT: 14 U/L (ref 14–54)
ANION GAP: 10 (ref 5–15)
AST: 22 U/L (ref 15–41)
Albumin: 2.9 g/dL — ABNORMAL LOW (ref 3.5–5.0)
Alkaline Phosphatase: 71 U/L (ref 38–126)
BILIRUBIN TOTAL: 0.8 mg/dL (ref 0.3–1.2)
BUN: 14 mg/dL (ref 6–20)
CHLORIDE: 95 mmol/L — AB (ref 101–111)
CO2: 31 mmol/L (ref 22–32)
Calcium: 8.3 mg/dL — ABNORMAL LOW (ref 8.9–10.3)
Creatinine, Ser: 0.9 mg/dL (ref 0.44–1.00)
GFR calc Af Amer: >60 mL/min (ref >60)
GFR calc non Af Amer: >60 mL/min (ref >60)
Glucose, Bld: 243 mg/dL — ABNORMAL HIGH (ref 65–99)
POTASSIUM: 2.9 mmol/L — AB (ref 3.5–5.1)
SODIUM: 136 mmol/L (ref 135–145)
TOTAL PROTEIN: 7.1 g/dL (ref 6.5–8.1)

## 2015-03-22 LAB — MAGNESIUM: Magnesium: 1.8 mg/dL (ref 1.7–2.4)

## 2015-03-22 LAB — C-REACTIVE PROTEIN: CRP: 3.5 mg/dL — ABNORMAL HIGH (ref <1.0)

## 2015-03-22 MED ORDER — PRO-STAT SUGAR FREE PO LIQD
30.0000 mL | Freq: Two times a day (BID) | ORAL | Status: DC
  Administered 2015-03-22 (×2): 30 mL via ORAL
  Filled 2015-03-22 (×3): qty 30

## 2015-03-22 MED ORDER — DEXTROSE 5 % IV SOLN
INTRAVENOUS | Status: AC
  Filled 2015-03-22: qty 2

## 2015-03-22 MED ORDER — INSULIN ASPART 100 UNIT/ML ~~LOC~~ SOLN
0.0000 [IU] | Freq: Every day | SUBCUTANEOUS | Status: DC
  Administered 2015-03-22: 4 [IU] via SUBCUTANEOUS
  Administered 2015-03-23: 3 [IU] via SUBCUTANEOUS

## 2015-03-22 MED ORDER — ONDANSETRON HCL 4 MG/2ML IJ SOLN
4.0000 mg | Freq: Four times a day (QID) | INTRAMUSCULAR | Status: DC | PRN
  Administered 2015-03-22: 4 mg via INTRAVENOUS
  Filled 2015-03-22: qty 2

## 2015-03-22 MED ORDER — POTASSIUM CHLORIDE CRYS ER 20 MEQ PO TBCR
40.0000 meq | EXTENDED_RELEASE_TABLET | ORAL | Status: AC
Start: 2015-03-22 — End: 2015-03-22
  Administered 2015-03-22 (×2): 40 meq via ORAL
  Filled 2015-03-22 (×2): qty 2

## 2015-03-22 MED ORDER — DEXTROSE 5 % IV SOLN
1.0000 g | Freq: Three times a day (TID) | INTRAVENOUS | Status: DC
  Administered 2015-03-22 – 2015-03-24 (×6): 1 g via INTRAVENOUS
  Filled 2015-03-22 (×10): qty 1

## 2015-03-22 MED ORDER — INSULIN ASPART 100 UNIT/ML ~~LOC~~ SOLN
0.0000 [IU] | Freq: Three times a day (TID) | SUBCUTANEOUS | Status: DC
  Administered 2015-03-22: 15 [IU] via SUBCUTANEOUS
  Administered 2015-03-23: 7 [IU] via SUBCUTANEOUS
  Administered 2015-03-23: 11 [IU] via SUBCUTANEOUS
  Administered 2015-03-23: 7 [IU] via SUBCUTANEOUS
  Administered 2015-03-24: 11 [IU] via SUBCUTANEOUS
  Administered 2015-03-24: 7 [IU] via SUBCUTANEOUS

## 2015-03-22 MED ORDER — HYDROMORPHONE HCL 4 MG PO TABS: 4.0000 mg | ORAL_TABLET | Freq: Four times a day (QID) | ORAL | Status: DC | PRN

## 2015-03-22 MED ORDER — INSULIN GLARGINE 100 UNIT/ML ~~LOC~~ SOLN
10.0000 [IU] | Freq: Every day | SUBCUTANEOUS | Status: DC
  Administered 2015-03-22: 10 [IU] via SUBCUTANEOUS
  Filled 2015-03-22 (×3): qty 0.1

## 2015-03-22 MED ORDER — LIVING WELL WITH DIABETES BOOK
Freq: Once | Status: AC
Start: 2015-03-22 — End: 2015-03-23
  Administered 2015-03-23: 12:00:00
  Filled 2015-03-22: qty 1

## 2015-03-22 MED ORDER — HYDROCODONE-ACETAMINOPHEN 10-325 MG PO TABS
1.0000 | ORAL_TABLET | Freq: Four times a day (QID) | ORAL | Status: DC | PRN
  Filled 2015-03-22: qty 1

## 2015-03-22 NOTE — Clinical Social Work Note (Signed)
CSW received consult for medication assistance. CM notified. CSW will sign off, but can be reconsulted if needed.  Madison Reed, [place]

## 2015-03-22 NOTE — Care Management Note (Signed)
Case Management Note  Patient Details  Name: SHUNDA RABADI MRN: [id number] Date of Birth: 1951/05/22   Expected Discharge Date:  03/25/15               Expected Discharge Plan:  Home/Self Care  In-House Referral:  NA  Discharge planning Services  CM Consult  Post Acute Care Choice:  Durable Medical Equipment Choice offered to:  Patient  DME Arranged:  Bedside commode DME Agency:  [place]:    [facility] Agency:     Status of Service:  Completed, signed off  Medicare Important Message Given:    Date Medicare IM Given:    Medicare IM give by:    Date Additional Medicare IM Given:    Additional Medicare Important Message give by:     If discussed at [facility] of Stay Meetings, dates discussed:    Additional Comments: Pt is from home. Pt says she lives with her husband. Pt states she is independent but needs help from family at times. Pt has cane and walker if she needs it. BSC has been ordered and pt has chosen [facility] for DME needs. Emma, of [facility], has been notified of orders and will obtain info from chart. Pt admitted with wound infection, pt currently goes to wound clinic and studies up to this point have been negative for osteo. No further CM needs anticipated.  Sherald Barge, RN 03/22/2015, 7:50 AM

## 2015-03-22 NOTE — Progress Notes (Signed)
ANTIBIOTIC CONSULT NOTE- follow up  Pharmacy Consult for aztreonam Indication: diabetic foot infection  Allergies  Allergen Reactions  . Penicillins Anaphylaxis  . Codeine Itching  . Hydrocodone-Acetaminophen Itching  . Shellfish Allergy Itching  . Sulfa Antibiotics Itching and Rash   Patient Measurements: Height: 5\' 5"  (165.1 cm) Weight: 199 lb 12.8 oz (90.629 kg) IBW/kg (Calculated) : 57  Vital Signs: Temp: 98.5 F (36.9 C) (06/10 0615) Temp Source: Oral (06/10 0615) BP: 166/70 mmHg (06/10 0615) Pulse Rate: 93 (06/10 0615)  Labs:  Recent Labs  03/21/15 2108 03/22/15 0600  WBC 11.2* 11.6*  HGB 13.4 12.4  PLT 287 301  CREATININE 1.09* 0.90   Estimated Creatinine Clearance: 73 mL/min (by C-G formula based on Cr of 0.9).  No results for input(s): VANCOTROUGH, VANCOPEAK, VANCORANDOM, GENTTROUGH, GENTPEAK, GENTRANDOM, TOBRATROUGH, TOBRAPEAK, TOBRARND, AMIKACINPEAK, AMIKACINTROU, AMIKACIN in the last 72 hours.   Microbiology: Recent Results (from the past 720 hour(s))  Blood Cultures x 2 sites     Status: None (Preliminary result)   Collection Time: 03/21/15 12:00 AM  Result Value Ref Range Status   Specimen Description BLOOD LEFT FOREARM  Final   Special Requests   Final    BOTTLES DRAWN AEROBIC AND ANAEROBIC AEB 6CC ANA 4CC   Culture PENDING  Incomplete   Report Status PENDING  Incomplete  Blood Cultures x 2 sites     Status: None (Preliminary result)   Collection Time: 03/21/15 11:46 PM  Result Value Ref Range Status   Specimen Description BLOOD LEFT HAND  Final   Special Requests   Final    BOTTLES DRAWN AEROBIC AND ANAEROBIC AEB 6CC ANA 4CC   Culture PENDING  Incomplete   Report Status PENDING  Incomplete    Medical History: Past Medical History  Diagnosis Date  . Diabetes mellitus without complication   . Hypertension   . Neuropathy   . Chronic back pain   . Bone spur   . Wound, open, foot   . Psoriasis   . Abdominal aneurysm   . Kidney stones     Medications:  Scheduled:  . aspirin  325 mg Oral Daily  . aztreonam  1 g Intravenous 3 times per day  . gabapentin  200 mg Oral TID  . heparin  5,000 Units Subcutaneous 3 times per day  . insulin aspart  0-15 Units Subcutaneous 6 times per day  . losartan  100 mg Oral Daily  . metronidazole  500 mg Intravenous Q8H  . potassium chloride  40 mEq Oral Q4H   Assessment: 64 yo female with hx DM, HTN, and neuropathy with ulcer on left great toe that is swollen, draining, and red.   Goal of Therapy:  eradication of infeciton  Plan:  Aztreonam 1gm IV q8h Continue Flagyl per MD Monitor labs, progress, cultures and sensitivities Deescalate ABX when appropriate  [place] Crane, Hasan Douse A, RPH 03/22/2015,8:58 AM

## 2015-03-22 NOTE — Progress Notes (Signed)
Initial Nutrition Assessment  DOCUMENTATION CODES:  Obesity unspecified  INTERVENTION:  Prostat BID  NUTRITION DIAGNOSIS:  Increased nutrient needs related to wound healing as evidenced by estimated needs, per patient/family report.  GOAL:  Patient will meet greater than or equal to 90% of their needs  MONITOR:  PO intake, Supplement acceptance, Labs, Weight trends, I & O's, Skin  REASON FOR ASSESSMENT:  Consult Wound healing  ASSESSMENT: Pt is a 64 y.o. year old female with significant past medical history of HTN, NIDDM, chronic R foot wound presenting with diabetic foot ulcer.  Pt stating she is very sleepy at time of visit, complaining how bad food is here (per nursing notes ate 100% of her breakfast this morning). When asked about at home diet, pt states she is on "Southern" diet, eating mostly fried foods. Pt states she eats a lot of vegetables and not too much meat. Emphasized importance of protein while healing the wounds and encouraged to order some protein rich foods with every tray, provided examples of good protein. Pt refused Nutrition Focused Physical Exam, but agreed to try Prostat, will order BID. Will continue to monitor  Labs reviewed: K 2.9, Glu 210 - 334, Ca 8.3  Height:  Ht Readings from Last 1 Encounters:  03/22/15 5\' 5"  (1.651 m)    Weight:  Wt Readings from Last 1 Encounters:  03/22/15 199 lb 12.8 oz (90.629 kg)    Ideal Body Weight:  57 kg  Wt Readings from Last 10 Encounters:  03/22/15 199 lb 12.8 oz (90.629 kg)  08/04/13 187 lb (84.823 kg)    BMI:  Body mass index is 33.25 kg/(m^2).  Estimated Nutritional Needs:  Kcal:  1500 - 1700  Protein:  85 - 95 g  Fluid:  >1.7L  Skin:  Wound (see comment) (Diabetic ulcer on R great toe)  Diet Order:  Diet heart healthy/carb modified Room service appropriate?: Yes; Fluid consistency:: Thin  EDUCATION NEEDS:  Education needs addressed   Intake/Output Summary (Last 24 hours) at  03/22/15 1042 Last data filed at 03/22/15 0900  Gross per 24 hour  Intake    720 ml  Output    250 ml  Net    470 ml    Last BM:  6/10  Madison Reed A. [facility] Dietetic Intern Pager: [phone] 03/22/2015 10:50 AM

## 2015-03-22 NOTE — Progress Notes (Signed)
Spoke with patient and her husband about diabetes and home regimen for diabetes control. Patient reports that she is followed by her PCP for diabetes management and currently she takes Glipizide 10 mg BID as an outpatient for diabetes control. Patient reports that she has been on Metformin and Janumet in the past but was taken off them due to excessive diarrhea. Patient states that she has also been on Tradjenta and Januvia in the past but had to stop taking them due to the cost.  Patient reports that she has not checked her glucose in over 6 months because "it hurst my fingers to bad because of my neuropathy". Patient reports that she would like to get a glucometer that will allow her to check her glucose on her forearm. Informed patient she could check with her insurance carrier or her pharmacy and they may be able to tell her if her insurance would cover that type of glucometer. In talking with the patient and her husband, patient admits that she is not following a diabetic diet and that she drinks regular sodas. Discussed carbohydrates, carbohydrate goals per day and meal, along with portion sizes. Patient requested more information on carb modified diet and sample meal plans. Consult placed for RD and provided patient with information on free outpatient diabetes education class at [facility]. Explained how the body breaks down carbohydrates into glucose and how hyperglycemia leads to damages within the blood vessels that lead to complications associated with uncontrolled diabetes.  Inquired about knowledge about A1C and patient reports that she does not completely understand what an A1C is and states that her last A1C was 12% (done by PCP).  Patient reports that prior to that her A1C was 7-8%. Inquired about changes and patient states that she was taking Tradjenta or Januvia but had to stop taking it because she could not afford the $200 copay for the medications under her old [employer]. Explained what an  A1C is, basic pathophysiology of DM Type 2, basic home care, importance of checking CBGs and maintaining good CBG control to prevent long-term and short-term complications. Discussed impact of nutrition, exercise, stress, sickness, and medications on diabetes control.  Stressed need for glycemic control with wound healing and to prevent further complications. Patient's husband reports that patient does not take Glipizide as prescribed. Patient states that she does take the Glipizide twice a day but at various times. Patient reports that she has to help her mother daily and that she sometimes forgets to take her medicine or can't remember if she has already taken it or not. Patient admits that she "just doesn't take time to take care of myself".  Spoke frankly with patient about her foot ulcer progressing to the point of an amputation if she does not get serious about getting her diabetes under control. Explained to the patient that she has to help herself if she intends on being healthy enough to care for others. Discussed various ways to help her remember to take medications and patient states that she will purchase a weekly pill box and put her medication in there to ensure she takes them as prescribed. Inquired about wiliness to use insulin if needed. Patient states that she is willing to do whatever she needs to and states that "this foot problem has open my eyes".  Patient's husband states that he feels that the Glipizide may be enough to keep her diabetes controlled if she will change her diet and start exercising.  Patient agrees that her  diabetes would be much better if she took her medicine around the same time everyday like she was suppose to take it, changed her diet, and started exercising. Encouraged patient to take diabetes serious and make lifestyle modifications with diet and exercise and to take medications as her doctor prescribes. Patient is interested in how much her copays would be for  Tradjenta, Januvia, and Lantus under her new insurance plan. Asked CM to do a benefit check and see how much those medications would be in case the doctor was wanted to restart the Tradjenta or Januvia or add Lantus.   Patient verbalized understanding of information discussed and she states that she has no further questions at this time related to diabetes.   Thanks, Barnie Alderman, RN, MSN, CCRN, CDE Diabetes Coordinator Inpatient Diabetes Program [phone] (Team Pager) [phone] ([place] office) [phone] [facility] office) [phone] [facility] office)

## 2015-03-22 NOTE — Progress Notes (Signed)
[facility] PROGRESS NOTE  Madison Reed:[id number] DOB: Nov 09, 1950 DOA: 03/21/2015 PCP: Glo Herring., MD  Assessment/Plan: Diabetic foot ulcer: hx of same. OP wound clinic in past. Await MRI. Sed rate elevated. Await CRP and HIV antibody. Wound culture pending. Concern for osteo. Evaluated by Grant who describes wound (dressing change just completed) slough covered wth purulence with plantar surface ruddy/red. WOC recommends daily dressing changes with silver hydrofiber and Aquacel Ag.  Continue aztreonam and flagyl day #2. Remains afebrile and non-toxic appearing.   Active Problems:  Hypokalemia: likely related to decreased oral intake. Mg level low end of normal. Consider mag supplement. Will replete potassium and recheck.   Diabetes: with complication. See #1. A1c pending. Home meds glipizide. CBG range 210-345. Will provide low dose lantus and SSI for optimal control    Hypertension: fair control. Home meds include cozaar. Continuing this.     Neuropathy: appears stable at baseline. Continue home gabapentin    Psoriasis: stable at baseline   Code Status: full Family Communication: none presenty Disposition Plan: home hopefully tomorrw   Consultants:  WOC  Procedures:  none  Antibiotics:  Aztreonam 03/21/15>>  Flagyl 03/21/15>>  HPI/Subjective: Sitting up in bed watching TV. Reports dissatisfaction with admission/blood draws/need for MRI/size of medication. Denies pain/discomfort.   Objective: Filed Vitals:   03/22/15 0615  BP: 166/70  Pulse: 93  Temp: 98.5 F (36.9 C)  Resp: 18    Intake/Output Summary (Last 24 hours) at 03/22/15 0928 Last data filed at 03/22/15 0900  Gross per 24 hour  Intake    720 ml  Output    250 ml  Net    470 ml   Filed Weights   03/21/15 2022 03/22/15 0100  Weight: 86.183 kg (190 lb) 90.629 kg (199 lb 12.8 oz)    Exam:   General:  Well nourished appears well  Cardiovascular: RRR No MGR No LE edema  Respiratory:  normal effort BS clear bilaterally   Abdomen: obese soft +BS non-tender  Musculoskeletal: dressing to left great toe dry and intact. See WOC note  Data Reviewed: Basic Metabolic Panel:  Recent Labs Lab 03/21/15 2108 03/21/15 2346 03/22/15 0600  NA 134*  --  136  K 3.0*  --  2.9*  CL 92*  --  95*  CO2 32  --  31  GLUCOSE 334*  --  243*  BUN 15  --  14  CREATININE 1.09*  --  0.90  CALCIUM 8.8*  --  8.3*  MG  --  1.8  --    Liver Function Tests:  Recent Labs Lab 03/22/15 0600  AST 22  ALT 14  ALKPHOS 71  BILITOT 0.8  PROT 7.1  ALBUMIN 2.9*   No results for input(s): LIPASE, AMYLASE in the last 168 hours. No results for input(s): AMMONIA in the last 168 hours. CBC:  Recent Labs Lab 03/21/15 2108 03/22/15 0600  WBC 11.2* 11.6*  NEUTROABS 8.7* 8.4*  HGB 13.4 12.4  HCT 39.7 38.0  MCV 87.8 88.0  PLT 287 301   Cardiac Enzymes: No results for input(s): CKTOTAL, CKMB, CKMBINDEX, TROPONINI in the last 168 hours. BNP (last 3 results) No results for input(s): BNP in the last 8760 hours.  ProBNP (last 3 results) No results for input(s): PROBNP in the last 8760 hours.  CBG:  Recent Labs Lab 03/22/15 0421 03/22/15 0743  GLUCAP 210* 223*    Recent Results (from the past 240 hour(s))  Blood Cultures x 2 sites  Status: None (Preliminary result)   Collection Time: 2015/04/16 12:00 AM  Result Value Ref Range Status   Specimen Description BLOOD LEFT FOREARM  Final   Special Requests   Final    BOTTLES DRAWN AEROBIC AND ANAEROBIC AEB 6CC ANA 4CC   Culture PENDING  Incomplete   Report Status PENDING  Incomplete  Blood Cultures x 2 sites     Status: None (Preliminary result)   Collection Time: April 16, 2015 11:46 PM  Result Value Ref Range Status   Specimen Description BLOOD LEFT HAND  Final   Special Requests   Final    BOTTLES DRAWN AEROBIC AND ANAEROBIC AEB 6CC ANA 4CC   Culture PENDING  Incomplete   Report Status PENDING  Incomplete     Studies: Dg Foot  Complete Left  2015/04/16   CLINICAL DATA:  64 year old female with a history of ulcer left great toe.  EXAM: LEFT FOOT - COMPLETE 3+ VIEW  COMPARISON:  None.  FINDINGS: No acute bony abnormality. Posttraumatic deformity, now healed, of second and third metatarsals. Mild degenerative changes of the interphalangeal joints of the toes and of the midfoot.  Soft tissue swelling at the left great toe. No subcutaneous gas. No radiopaque foreign body.  Degenerative changes of the hindfoot.  Calcifications of the posterior tibial artery.  IMPRESSION: No acute bony abnormality identified.  Soft tissue swelling of the left great toe, without bony changes to suggest osteomyelitis on the study.  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 If the patient has not yet been evaluated for claudication/CLI, noninvasive testing with ABI, segmental duplex, and segmental pulse volume recording may be considered as well as office based evaluation.  Signed,  Dulcy Fanny. Earleen Newport, DO  [facility]  [facility]   Electronically Signed   By: Corrie Mckusick D.O.   On: 04/16/2015 21:35    Scheduled Meds: . aspirin  325 mg Oral Daily  . aztreonam  1 g Intravenous 3 times per day  . gabapentin  200 mg Oral TID  . heparin  5,000 Units Subcutaneous 3 times per day  . insulin aspart  0-15 Units Subcutaneous 6 times per day  . losartan  100 mg Oral Daily  . metronidazole  500 mg Intravenous Q8H  . potassium chloride  40 mEq Oral Q4H   Continuous Infusions: . sodium chloride Stopped (03/22/15 0130)    Principal Problem:       Time spent: [facility] Pager [phone]. If 7PM-7AM, please contact night-coverage at [email or web address], password [facility] 03/22/2015, 9:28 AM  LOS: 1 day

## 2015-03-22 NOTE — Progress Notes (Signed)
Inpatient Diabetes Program Recommendations  AACE/ADA: New Consensus Statement on Inpatient Glycemic Control (2013)  Target Ranges:  Prepandial:   less than 140 mg/dL      Peak postprandial:   less than 180 mg/dL (1-2 hours)      Critically ill patients:  140 - 180 mg/dL  Results for MAKAYLIA, HEWETT (MRN [id number]) as of 03/22/2015 08:44  Ref. Range 03/21/2015 21:08 03/22/2015 06:00  Glucose Latest Ref Range: 65-99 mg/dL 334 (H) 243 (H)    Results for GLAYDS, INSCO (MRN [id number]) as of 03/22/2015 08:44  Ref. Range 03/22/2015 04:21 03/22/2015 07:43  Glucose-Capillary Latest Ref Range: 65-99 mg/dL 210 (H) 223 (H)   Diabetes history: DM2 Outpatient Diabetes medications: Glipizide 10 mg BID Current orders for Inpatient glycemic control: Novolog 0-15 units Q4H  Inpatient Diabetes Program Recommendations Insulin - Basal: Please consider ordering low dose basal insulin. Recommend starting with Lantus 10 units daily (based on 90.6 kg x 0.1 units). Correction (SSI): If patient is eating and tolerating diet, please consider changing frequency of CBGs and Novolog to ACHS and consider increasing Novolog to Resistant correction scale. HgbA1C: A1C in process.  Thanks, Barnie Alderman, RN, MSN, CCRN, CDE Diabetes Coordinator Inpatient Diabetes Program [phone] (Team Pager from [place] to [place]) [phone] ([facility] office) [phone] [facility] office) [phone] [facility] office)

## 2015-03-22 NOTE — Plan of Care (Signed)
Problem: Food- and Nutrition-Related Knowledge Deficit (NB-1.1) Goal: Nutrition education Formal process to instruct or train a patient/client in a skill or to impart knowledge to help patients/clients voluntarily manage or modify food choices and eating behavior to maintain or improve health. Outcome: Adequate for Discharge Received Consult for Diet Education on Carbohydrate Counting and for Low Sodium Diet. Provided pt with several handouts and meal plans for reduced sodium intake and consistent carbohydrate diet. Walked pt through handouts, explaining the importance of counting carbs and reducing salt in the diet. Diet recall shows pt consuming large amounts of regular soda, sweet tea and juice. Pt stated she is on "Southern" diet and fries mostly anything, likes to bake desserts. Pt stated she wants to "learn to cook", but did not appear to be willing to change what she is currently doing in a meaningful way. Complained how now people will not praise her for good [place] cooking. Encouraged pt to eliminate sugary beverage intake, but patient stated she is "Coca-holic" and it will be very hard, she is willing to try, however.  Provided pt with handout how to read nutrition labels, pt complained about small portions while at the hospital, and receiving, what she believes, high sugar items (syrup with her breakfast, juice, crackers).  Pt is on HH/Cho diet and items she received are within her allowance and/or sugar free. Pt asked if she is getting a glucose meter, and how can she continue to eat things she likes.  Explained several times to pt that it is very important to be aware of her carbohydrate and salt intake, however pt continued to name things she will be devastated living without. Encouraged pt to reach out and participate in some kind of diabetes management program for better control. Teach back method used, pt verbalized understanding, expect poor to fair compliance.  Madison Reed A. [place] [facility]  Intern Pager: [phone] 03/22/2015 5:13 PM

## 2015-03-22 NOTE — Consult Note (Addendum)
WOC wound consult note Reason for Consult:Diabetic ulcer to left great toe. Dorsal and plantar aspects.  Patient has been receiving pulsatile lavage and MD sharps debridement in outpatient setting. Awaiting MRI, antibiotic therapy initiated.  Bedside nurse assisting with remote consult.  Wound type: Diabetic/neuropathic ulcer to left great toe.  Dorsal and plantar surface.  Pressure Ulcer POA: Yes Measurement: Left great toe:  Dorsal:  2 cm x 1 cm x 0.1 cm slough covered wound bed with purulence Plantar surface 1 cm x 0.5 cm x 0.5 cm ruddy red wound bed.   Drainage (amount, consistency, odor) Minimal purulent drainage.  No odor.  Periwound: Intact Dressing procedure/placement/frequency:Cleanse left great toe with NS and pat gently dry.  Gently fill wound bed to plantar toe with silver hydrofiber (Aquacel AGKellie Simmering # [phone]).  Cover dorsal toe wound bed with Aquacel Ag.  Change daily.  Will not follow at this time.  Please re-consult if needed.  Domenic Moras RN BSN [place] Pager [phone]

## 2015-03-23 DIAGNOSIS — L97909 Non-pressure chronic ulcer of unspecified part of unspecified lower leg with unspecified severity: Secondary | ICD-10-CM

## 2015-03-23 DIAGNOSIS — E1165 Type 2 diabetes mellitus with hyperglycemia: Secondary | ICD-10-CM

## 2015-03-23 DIAGNOSIS — IMO0002 Reserved for concepts with insufficient information to code with codable children: Secondary | ICD-10-CM

## 2015-03-23 DIAGNOSIS — M009 Pyogenic arthritis, unspecified: Secondary | ICD-10-CM

## 2015-03-23 DIAGNOSIS — E11622 Type 2 diabetes mellitus with other skin ulcer: Secondary | ICD-10-CM

## 2015-03-23 LAB — COMPREHENSIVE METABOLIC PANEL
ALT: 14 U/L (ref 14–54)
AST: 21 U/L (ref 15–41)
Albumin: 2.6 g/dL — ABNORMAL LOW (ref 3.5–5.0)
Alkaline Phosphatase: 68 U/L (ref 38–126)
Anion gap: 6 (ref 5–15)
BILIRUBIN TOTAL: 0.6 mg/dL (ref 0.3–1.2)
BUN: 13 mg/dL (ref 6–20)
CHLORIDE: 102 mmol/L (ref 101–111)
CO2: 28 mmol/L (ref 22–32)
Calcium: 8.1 mg/dL — ABNORMAL LOW (ref 8.9–10.3)
Creatinine, Ser: 0.79 mg/dL (ref 0.44–1.00)
GLUCOSE: 255 mg/dL — AB (ref 65–99)
Potassium: 4 mmol/L (ref 3.5–5.1)
Sodium: 136 mmol/L (ref 135–145)
TOTAL PROTEIN: 6.4 g/dL — AB (ref 6.5–8.1)

## 2015-03-23 LAB — GLUCOSE, CAPILLARY
GLUCOSE-CAPILLARY: 249 mg/dL — AB (ref 65–99)
Glucose-Capillary: 243 mg/dL — ABNORMAL HIGH (ref 65–99)
Glucose-Capillary: 276 mg/dL — ABNORMAL HIGH (ref 65–99)
Glucose-Capillary: 238 mg/dL — ABNORMAL HIGH (ref 65–99)
Glucose-Capillary: 269 mg/dL — ABNORMAL HIGH (ref 65–99)

## 2015-03-23 LAB — CBC WITH DIFFERENTIAL/PLATELET
BASOS ABS: 0.1 10*3/uL (ref 0.0–0.1)
Basophils Relative: 1 % (ref 0–1)
Eosinophils Absolute: 0.2 10*3/uL (ref 0.0–0.7)
Eosinophils Relative: 2 % (ref 0–5)
HCT: 37.4 % (ref 36.0–46.0)
Hemoglobin: 12 g/dL (ref 12.0–15.0)
LYMPHS ABS: 1.8 10*3/uL (ref 0.7–4.0)
LYMPHS PCT: 24 % (ref 12–46)
MCH: 28.7 pg (ref 26.0–34.0)
MCHC: 32.1 g/dL (ref 30.0–36.0)
MCV: 89.5 fL (ref 78.0–100.0)
MONOS PCT: 8 % (ref 3–12)
Monocytes Absolute: 0.6 10*3/uL (ref 0.1–1.0)
NEUTROS ABS: 5.1 10*3/uL (ref 1.7–7.7)
Neutrophils Relative %: 65 % (ref 43–77)
Platelets: 269 10*3/uL (ref 150–400)
RBC: 4.18 MIL/uL (ref 3.87–5.11)
RDW: 15.3 % (ref 11.5–15.5)
WBC: 7.8 10*3/uL (ref 4.0–10.5)

## 2015-03-23 LAB — HEMOGLOBIN A1C
Hgb A1c MFr Bld: 10.1 % — ABNORMAL HIGH (ref 4.8–5.6)
Mean Plasma Glucose: 243 mg/dL

## 2015-03-23 LAB — HIV ANTIBODY (ROUTINE TESTING W REFLEX): HIV Screen 4th Generation wRfx: NONREACTIVE

## 2015-03-23 MED ORDER — INSULIN GLARGINE 100 UNIT/ML ~~LOC~~ SOLN
15.0000 [IU] | Freq: Every day | SUBCUTANEOUS | Status: DC
  Administered 2015-03-24: 15 [IU] via SUBCUTANEOUS
  Filled 2015-03-23 (×2): qty 0.15

## 2015-03-23 NOTE — Consult Note (Signed)
Reason for Consult: Left great toe ulceration Referring Physician: Hospitalist  Madison Reed is an 64 y.o. female.  HPI: Patient is a 64 year old white female long-standing history of uncontrolled diabetes mellitus and peripheral vascular disease who has had difficulties with her feet for some time now. She was initially seen by podiatry, but then was referred to physical therapy. There have been applying wound care to the left second toe. It started to increase in pain and swelling. She subsequently came to the emergency room for further evaluation treatment. MRI of the left foot reveals cellulitis with infection of the joint space of the left great toe.  Past Medical History  Diagnosis Date  . Diabetes mellitus without complication   . Hypertension   . Neuropathy   . Chronic back pain   . Bone spur   . Wound, open, foot   . Psoriasis   . Abdominal aneurysm   . Kidney stones   . CVA (cerebral infarction)     Past Surgical History  Procedure Laterality Date  . Vesicovaginal fistula closure w/ tah    . Abdominal hysterectomy    . Bladder suspension      Family History  Problem Relation Age of Onset  . Aneurysm Mother   . Diabetes Mother   . Hypertension Mother     Social History:  reports that she has quit smoking. She has never used smokeless tobacco. She reports that she does not drink alcohol or use illicit drugs.  Allergies:  Allergies  Allergen Reactions  . Penicillins Anaphylaxis  . Codeine Itching  . Hydrocodone-Acetaminophen Itching  . Shellfish Allergy Itching  . Sulfa Antibiotics Itching and Rash    Medications: I have reviewed the patient's current medications.  Results for orders placed or performed during the hospital encounter of 03/21/15 (from the past 48 hour(s))  Prealbumin     Status: Abnormal   Collection Time: 03/21/15  9:04 PM  Result Value Ref Range   Prealbumin 12.2 (L) 18 - 38 mg/dL    Comment: Performed at [facility]  CBC with  Differential     Status: Abnormal   Collection Time: 03/21/15  9:08 PM  Result Value Ref Range   WBC 11.2 (H) 4.0 - 10.5 K/uL   RBC 4.52 3.87 - 5.11 MIL/uL   Hemoglobin 13.4 12.0 - 15.0 g/dL   HCT 39.7 36.0 - 46.0 %   MCV 87.8 78.0 - 100.0 fL   MCH 29.6 26.0 - 34.0 pg   MCHC 33.8 30.0 - 36.0 g/dL   RDW 15.2 11.5 - 15.5 %   Platelets 287 150 - 400 K/uL   Neutrophils Relative % 78 (H) 43 - 77 %   Neutro Abs 8.7 (H) 1.7 - 7.7 K/uL   Lymphocytes Relative 15 12 - 46 %   Lymphs Abs 1.6 0.7 - 4.0 K/uL   Monocytes Relative 6 3 - 12 %   Monocytes Absolute 0.7 0.1 - 1.0 K/uL   Eosinophils Relative 1 0 - 5 %   Eosinophils Absolute 0.2 0.0 - 0.7 K/uL   Basophils Relative 0 0 - 1 %   Basophils Absolute 0.1 0.0 - 0.1 K/uL  Basic metabolic panel     Status: Abnormal   Collection Time: 03/21/15  9:08 PM  Result Value Ref Range   Sodium 134 (L) 135 - 145 mmol/L   Potassium 3.0 (L) 3.5 - 5.1 mmol/L   Chloride 92 (L) 101 - 111 mmol/L   CO2 32 22 -  32 mmol/L   Glucose, Bld 334 (H) 65 - 99 mg/dL   BUN 15 6 - 20 mg/dL   Creatinine, Ser 1.09 (H) 0.44 - 1.00 mg/dL   Calcium 8.8 (L) 8.9 - 10.3 mg/dL   GFR calc non Af Amer 54 (L) >60 mL/min   GFR calc Af Amer >60 >60 mL/min    Comment: (NOTE) The eGFR has been calculated using the CKD EPI equation. This calculation has not been validated in all clinical situations. eGFR's persistently <60 mL/min signify possible Chronic Kidney Disease.    Anion gap 10 5 - 15  Sedimentation rate     Status: Abnormal   Collection Time: 03/21/15  9:29 PM  Result Value Ref Range   Sed Rate 87 (H) 0 - 22 mm/hr  Hemoglobin A1c     Status: Abnormal   Collection Time: 03/21/15  9:37 PM  Result Value Ref Range   Hgb A1c MFr Bld 10.1 (H) 4.8 - 5.6 %    Comment: (NOTE)         Pre-diabetes: 5.7 - 6.4         Diabetes: >6.4         Glycemic control for adults with diabetes: <7.0    Mean Plasma Glucose 243 mg/dL    Comment: (NOTE) Performed At: [facility] [place], [place] [id number] Lindon Romp MD VE:[id number]   Wound culture     Status: None (Preliminary result)   Collection Time: 03/21/15  9:40 PM  Result Value Ref Range   Specimen Description FOOT    Special Requests Normal    Gram Stain      FEW WBC PRESENT, PREDOMINANTLY MONONUCLEAR NO SQUAMOUS EPITHELIAL CELLS SEEN FEW GRAM POSITIVE COCCI IN PAIRS IN CLUSTERS Performed at [facility] Performed at [facility]    Report Status PENDING   Blood Cultures x 2 sites     Status: None (Preliminary result)   Collection Time: 03/21/15 11:46 PM  Result Value Ref Range   Specimen Description BLOOD LEFT HAND    Special Requests      BOTTLES DRAWN AEROBIC AND ANAEROBIC AEB 6CC ANA 4CC   Culture NO GROWTH 1 DAY    Report Status PENDING   Magnesium     Status: None   Collection Time: 03/21/15 11:46 PM  Result Value Ref Range   Magnesium 1.8 1.7 - 2.4 mg/dL  Glucose, capillary     Status: Abnormal   Collection Time: 03/22/15 12:50 AM  Result Value Ref Range   Glucose-Capillary 345 (H) 65 - 99 mg/dL   Comment 1 Notify RN    Comment 2 Document in Chart   Glucose, capillary     Status: Abnormal   Collection Time: 03/22/15  4:21 AM  Result Value Ref Range   Glucose-Capillary 210 (H) 65 - 99 mg/dL   Comment 1 Notify RN    Comment 2 Document in Chart   HIV antibody     Status: None   Collection Time: 03/22/15  6:00 AM  Result Value Ref Range   HIV Screen 4th Generation wRfx Non Reactive Non Reactive    Comment: (NOTE) Performed At: [facility] [place], [place] [id number] Lindon Romp MD LY:[id number]   C-reactive protein     Status: Abnormal   Collection Time: 03/22/15  6:00 AM  Result Value Ref Range   CRP 3.5 (H) <  1.0 mg/dL    Comment: Performed at [facility]  Comprehensive metabolic panel     Status: Abnormal   Collection Time: 03/22/15  6:00  AM  Result Value Ref Range   Sodium 136 135 - 145 mmol/L   Potassium 2.9 (L) 3.5 - 5.1 mmol/L   Chloride 95 (L) 101 - 111 mmol/L   CO2 31 22 - 32 mmol/L   Glucose, Bld 243 (H) 65 - 99 mg/dL   BUN 14 6 - 20 mg/dL   Creatinine, Ser 0.90 0.44 - 1.00 mg/dL   Calcium 8.3 (L) 8.9 - 10.3 mg/dL   Total Protein 7.1 6.5 - 8.1 g/dL   Albumin 2.9 (L) 3.5 - 5.0 g/dL   AST 22 15 - 41 U/L   ALT 14 14 - 54 U/L   Alkaline Phosphatase 71 38 - 126 U/L   Total Bilirubin 0.8 0.3 - 1.2 mg/dL   GFR calc non Af Amer >60 >60 mL/min   GFR calc Af Amer >60 >60 mL/min    Comment: (NOTE) The eGFR has been calculated using the CKD EPI equation. This calculation has not been validated in all clinical situations. eGFR's persistently <60 mL/min signify possible Chronic Kidney Disease.    Anion gap 10 5 - 15  CBC WITH DIFFERENTIAL     Status: Abnormal   Collection Time: 03/22/15  6:00 AM  Result Value Ref Range   WBC 11.6 (H) 4.0 - 10.5 K/uL   RBC 4.32 3.87 - 5.11 MIL/uL   Hemoglobin 12.4 12.0 - 15.0 g/dL   HCT 38.0 36.0 - 46.0 %   MCV 88.0 78.0 - 100.0 fL   MCH 28.7 26.0 - 34.0 pg   MCHC 32.6 30.0 - 36.0 g/dL   RDW 15.1 11.5 - 15.5 %   Platelets 301 150 - 400 K/uL   Neutrophils Relative % 72 43 - 77 %   Neutro Abs 8.4 (H) 1.7 - 7.7 K/uL   Lymphocytes Relative 20 12 - 46 %   Lymphs Abs 2.3 0.7 - 4.0 K/uL   Monocytes Relative 5 3 - 12 %   Monocytes Absolute 0.6 0.1 - 1.0 K/uL   Eosinophils Relative 2 0 - 5 %   Eosinophils Absolute 0.2 0.0 - 0.7 K/uL   Basophils Relative 1 0 - 1 %   Basophils Absolute 0.1 0.0 - 0.1 K/uL  Glucose, capillary     Status: Abnormal   Collection Time: 03/22/15  7:43 AM  Result Value Ref Range   Glucose-Capillary 223 (H) 65 - 99 mg/dL   Comment 1 Notify RN    Comment 2 Document in Chart   Glucose, capillary     Status: Abnormal   Collection Time: 03/22/15 11:20 AM  Result Value Ref Range   Glucose-Capillary 353 (H) 65 - 99 mg/dL   Comment 1 Notify RN    Comment 2  Document in Chart   Glucose, capillary     Status: Abnormal   Collection Time: 03/22/15  5:16 PM  Result Value Ref Range   Glucose-Capillary 290 (H) 65 - 99 mg/dL   Comment 1 Notify RN    Comment 2 Document in Chart   Glucose, capillary     Status: Abnormal   Collection Time: 03/22/15  8:08 PM  Result Value Ref Range   Glucose-Capillary 334 (H) 65 - 99 mg/dL   Comment 1 Notify RN    Comment 2 Document in Chart   Glucose, capillary     Status: Abnormal  Collection Time: 03/23/15  4:16 AM  Result Value Ref Range   Glucose-Capillary 249 (H) 65 - 99 mg/dL   Comment 1 Notify RN    Comment 2 Document in Chart   Comprehensive metabolic panel     Status: Abnormal   Collection Time: 03/23/15  6:11 AM  Result Value Ref Range   Sodium 136 135 - 145 mmol/L   Potassium 4.0 3.5 - 5.1 mmol/L    Comment: DELTA CHECK NOTED   Chloride 102 101 - 111 mmol/L   CO2 28 22 - 32 mmol/L   Glucose, Bld 255 (H) 65 - 99 mg/dL   BUN 13 6 - 20 mg/dL   Creatinine, Ser 0.79 0.44 - 1.00 mg/dL   Calcium 8.1 (L) 8.9 - 10.3 mg/dL   Total Protein 6.4 (L) 6.5 - 8.1 g/dL   Albumin 2.6 (L) 3.5 - 5.0 g/dL   AST 21 15 - 41 U/L   ALT 14 14 - 54 U/L   Alkaline Phosphatase 68 38 - 126 U/L   Total Bilirubin 0.6 0.3 - 1.2 mg/dL   GFR calc non Af Amer >60 >60 mL/min   GFR calc Af Amer >60 >60 mL/min    Comment: (NOTE) The eGFR has been calculated using the CKD EPI equation. This calculation has not been validated in all clinical situations. eGFR's persistently <60 mL/min signify possible Chronic Kidney Disease.    Anion gap 6 5 - 15  CBC WITH DIFFERENTIAL     Status: None   Collection Time: 03/23/15  6:11 AM  Result Value Ref Range   WBC 7.8 4.0 - 10.5 K/uL   RBC 4.18 3.87 - 5.11 MIL/uL   Hemoglobin 12.0 12.0 - 15.0 g/dL   HCT 37.4 36.0 - 46.0 %   MCV 89.5 78.0 - 100.0 fL   MCH 28.7 26.0 - 34.0 pg   MCHC 32.1 30.0 - 36.0 g/dL   RDW 15.3 11.5 - 15.5 %   Platelets 269 150 - 400 K/uL   Neutrophils Relative %  65 43 - 77 %   Neutro Abs 5.1 1.7 - 7.7 K/uL   Lymphocytes Relative 24 12 - 46 %   Lymphs Abs 1.8 0.7 - 4.0 K/uL   Monocytes Relative 8 3 - 12 %   Monocytes Absolute 0.6 0.1 - 1.0 K/uL   Eosinophils Relative 2 0 - 5 %   Eosinophils Absolute 0.2 0.0 - 0.7 K/uL   Basophils Relative 1 0 - 1 %   Basophils Absolute 0.1 0.0 - 0.1 K/uL  Glucose, capillary     Status: Abnormal   Collection Time: 03/23/15  7:38 AM  Result Value Ref Range   Glucose-Capillary 243 (H) 65 - 99 mg/dL  Glucose, capillary     Status: Abnormal   Collection Time: 03/23/15 11:14 AM  Result Value Ref Range   Glucose-Capillary 276 (H) 65 - 99 mg/dL    Mr Foot Left Wo Contrast  03/22/2015   CLINICAL DATA:  Nonhealing chronic ulcer of the left great toe. History of diabetes.  EXAM: MRI OF THE LEFT FOREFOOT WITHOUT CONTRAST  TECHNIQUE: Multiplanar, multisequence MR imaging was performed. No intravenous contrast was administered.  COMPARISON:  None.  FINDINGS: Significant patient motion throughout the examination degrading image quality limiting evaluation.  There is a soft tissue ulcer along the plantar aspect of the first IP joint extending down to the first IP joint. There is marrow edema involving the first proximal and distal phalanx and periarticular T1 signal abnormality,  with a small joint effusion of the first IP joint most concerning for septic arthritis. No frank cortical destruction.  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 There is no other marrow signal abnormality. There is no fracture or dislocation. There is no focal drainable fluid collection. There is no hematoma. The visualized flexor, extensor and peroneal tendons are intact. There is mild soft tissue edema within the flexor digitorum brevis muscle.  IMPRESSION: 1. Soft tissue ulcer along the plantar aspect of the first IP joint extending down to the first IP joint. Marrow edema involving the first proximal and distal phalanx and periarticular T1 signal  abnormality, with a small joint effusion of the first IP joint most concerning for septic arthritis.   Electronically Signed   By: Kathreen Devoid   On: 03/22/2015 16:20   Dg Foot Complete Left  03/21/2015   CLINICAL DATA:  64 year old female with a history of ulcer left great toe.  EXAM: LEFT FOOT - COMPLETE 3+ VIEW  COMPARISON:  None.  FINDINGS: No acute bony abnormality. Posttraumatic deformity, now healed, of second and third metatarsals. Mild degenerative changes of the interphalangeal joints of the toes and of the midfoot.  Soft tissue swelling at the left great toe. No subcutaneous gas. No radiopaque foreign body.  Degenerative changes of the hindfoot.  Calcifications of the posterior tibial artery.  IMPRESSION: No acute bony abnormality identified.  Soft tissue swelling of the left great toe, without bony changes to suggest osteomyelitis on the study.  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 If the patient has not yet been evaluated for claudication/CLI, noninvasive testing with ABI, segmental duplex, and segmental pulse volume recording may be considered as well as office based evaluation.  Signed,  Dulcy Fanny. Earleen Newport, DO  [facility]  [facility]   Electronically Signed   By: Corrie Mckusick D.O.   On: 03/21/2015 21:35    ROS: See chart Blood pressure 180/67, pulse 82, temperature 99 F (37.2 C), temperature source Oral, resp. rate 20, height 5' 5"  (1.651 m), weight 90.629 kg (199 lb 12.8 oz), SpO2 95 %. Physical Exam: Talkative white female in no acute distress. Left foot with faintly palpable dorsalis pedis pulse. She has an open ulceration along the dorsum of the left great toe over the proximal MP joint. Some of this tissue was removed and some tendon appear to be exposed. The soft tissue was silk be in nature. Erythema is noted down to the base of the left second toe. I cannot  appreciate any further ascending cellulitis above the ankle. There is some sensation intact in the left foot.  Assessment/Plan: Impression: Left great toe diabetic ulceration with probable deep infection of the joint space. Plan: I did tell the patient that I was worried that she may lose her left great toe. She is resistant to this at this time. I thus recommend 6 week course of IV and a biotics as well as referral to the [facility] wound care center. She was told she needs to control her diabetes. Will check to see patient has had arterial segmental Dopplers in the past. If not, they will be ordered.  Tanessa Tidd A 03/23/2015, 12:27 PM

## 2015-03-23 NOTE — Plan of Care (Signed)
Problem: Consults Goal: Diagnosis-Diabetes Mellitus Outcome: Completed/Met Date Met:  03/23/15 Hyperglycemia

## 2015-03-23 NOTE — Progress Notes (Signed)
PROGRESS NOTE  Madison Reed HAL:[id number] DOB: 04/10/1951 DOA: 03/21/2015 PCP: Glo Herring., MD  Summary: 64 year old woman with history of diabetes mellitus with peripheral neuropathy, chronic left foot wound, initially followed by podiatry and now followed by the wound care center. She has had increasing drainage, was seen by her primary care physician started on ciprofloxacin and then clindamycin was added. Despite this she continued to worsen and presented to the emergency department.  Assessment/Plan: 1. 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 2. Diabetes mellitus type 2, uncontrolled with hemoglobin A1c 10.1 3. Tobacco dependence. Strongly encouraged cessation.   Increase insulin.RN teaching.  "Cleanse left great toe with NS and pat gently dry. Gently fill wound bed to plantar toe with silver hydrofiber (Aquacel AGKellie Simmering # [id number]). Cover dorsal toe wound bed with Aquacel Ag. Change daily."  outpatient ABI  Outpatient follow-up with the wound care center  Place PICC line, plan 4-6 weeks IV antibiotics  Likely home 6/12  Long discussion with the patient, son at bedside with many questions surrounding treatment, home health as well as diabetic diet.  Code Status: full code DVT prophylaxis: heparin Family Communication:  Disposition Plan: home  Murray Hodgkins, MD  [facility]  Pager [phone] If 7PM-7AM, please contact night-coverage at [email or web address], password [email or web address] 03/23/2015, 6:40 PM  LOS: 2 days   Consultants:  General surgery  Procedures:  PICC line 6/12  Antibiotics:  Aztreonam 6/9 >>  Flagyl 6/9 >>  HPI/Subjective: Overall doing well. Wants to change her diet and stop smoking.  Objective: Filed Vitals:   03/22/15 1313 03/22/15 2146 03/23/15 0418 03/23/15 1402  BP: 164/76 187/69 180/67 138/59  Pulse: 87 74 82 79  Temp: 98.7 F (37.1 C) 98.1 F (36.7 C) 99 F (37.2 C) 98.6 F (37 C)  TempSrc:  Oral Oral Oral Oral  Resp: 20 20 20 20   Height:      Weight:      SpO2: 97% 95% 95% 94%    Intake/Output Summary (Last 24 hours) at 03/23/15 1840 Last data filed at 03/23/15 1300  Gross per 24 hour  Intake    900 ml  Output      0 ml  Net    900 ml     Filed Weights   03/21/15 2022 03/22/15 0100  Weight: 86.183 kg (190 lb) 90.629 kg (199 lb 12.8 oz)    Exam:     Afebrile, vital signs stable. General:  Appears calm and comfortable Cardiovascular: RRR, no m/r/g. No LE edema. Respiratory: CTA bilaterally, no w/r/r. Normal respiratory effort. Psychiatric: grossly normal mood and affect, speech fluent and appropriate  New data reviewed:  Blood sugars 341D  Basic metabolic panel unremarkable, hepatic function panel unremarkable  CBC unremarkable  Wound culture and blood culture pending  Hemoglobin A1c 10.1  MRI showed marrow edema, small joint effusion first IP joint left great toe concerning for septic arthritis.  Pertinent data since admission:    Pending data:    Scheduled Meds: . aspirin  325 mg Oral Daily  . aztreonam  1 g Intravenous 3 times per day  . feeding supplement (PRO-STAT SUGAR FREE 64)  30 mL Oral BID  . gabapentin  200 mg Oral TID  . heparin  5,000 Units Subcutaneous 3 times per day  . insulin aspart  0-20 Units Subcutaneous TID WC  . insulin aspart  0-5 Units Subcutaneous QHS  . insulin glargine  10 Units Subcutaneous QHS  . losartan  100 mg  Oral Daily  . metronidazole  500 mg Intravenous Q8H   Continuous Infusions:   Principal Problem:   Diabetic foot ulcer Active Problems:   Hypertension   Neuropathy   Psoriasis   Hypokalemia   Cellulitis of left lower extremity   Diabetic ulcer of left foot   Time spent 35 minutes, greater than 50% in counseling and coordination of care

## 2015-03-24 ENCOUNTER — Inpatient Hospital Stay (HOSPITAL_COMMUNITY): Payer: 59

## 2015-03-24 ENCOUNTER — Encounter (HOSPITAL_COMMUNITY): Payer: 59

## 2015-03-24 DIAGNOSIS — L03032 Cellulitis of left toe: Secondary | ICD-10-CM

## 2015-03-24 DIAGNOSIS — M00872 Arthritis due to other bacteria, left ankle and foot: Secondary | ICD-10-CM

## 2015-03-24 DIAGNOSIS — L03039 Cellulitis of unspecified toe: Secondary | ICD-10-CM | POA: Insufficient documentation

## 2015-03-24 DIAGNOSIS — M009 Pyogenic arthritis, unspecified: Secondary | ICD-10-CM | POA: Insufficient documentation

## 2015-03-24 LAB — COMPREHENSIVE METABOLIC PANEL
ALK PHOS: 70 U/L (ref 38–126)
ALT: 15 U/L (ref 14–54)
AST: 24 U/L (ref 15–41)
Albumin: 2.8 g/dL — ABNORMAL LOW (ref 3.5–5.0)
Anion gap: 8 (ref 5–15)
BUN: 13 mg/dL (ref 6–20)
CO2: 27 mmol/L (ref 22–32)
CREATININE: 0.76 mg/dL (ref 0.44–1.00)
Calcium: 8.4 mg/dL — ABNORMAL LOW (ref 8.9–10.3)
Chloride: 99 mmol/L — ABNORMAL LOW (ref 101–111)
GFR calc Af Amer: >60 mL/min (ref >60)
GFR calc non Af Amer: >60 mL/min (ref >60)
Glucose, Bld: 243 mg/dL — ABNORMAL HIGH (ref 65–99)
POTASSIUM: 4 mmol/L (ref 3.5–5.1)
Sodium: 134 mmol/L — ABNORMAL LOW (ref 135–145)
Total Bilirubin: 0.7 mg/dL (ref 0.3–1.2)
Total Protein: 6.8 g/dL (ref 6.5–8.1)

## 2015-03-24 LAB — WOUND CULTURE: SPECIAL REQUESTS: NORMAL

## 2015-03-24 LAB — CBC WITH DIFFERENTIAL/PLATELET
BASOS PCT: 1 % (ref 0–1)
Basophils Absolute: 0.1 10*3/uL (ref 0.0–0.1)
Eosinophils Absolute: 0.2 10*3/uL (ref 0.0–0.7)
Eosinophils Relative: 2 % (ref 0–5)
HCT: 37.4 % (ref 36.0–46.0)
Hemoglobin: 12.4 g/dL (ref 12.0–15.0)
LYMPHS ABS: 2 10*3/uL (ref 0.7–4.0)
LYMPHS PCT: 24 % (ref 12–46)
MCH: 29.3 pg (ref 26.0–34.0)
MCHC: 33.2 g/dL (ref 30.0–36.0)
MCV: 88.4 fL (ref 78.0–100.0)
Monocytes Absolute: 0.5 10*3/uL (ref 0.1–1.0)
Monocytes Relative: 6 % (ref 3–12)
NEUTROS PCT: 67 % (ref 43–77)
Neutro Abs: 5.7 10*3/uL (ref 1.7–7.7)
PLATELETS: 256 10*3/uL (ref 150–400)
RBC: 4.23 MIL/uL (ref 3.87–5.11)
RDW: 15.5 % (ref 11.5–15.5)
WBC: 8.5 10*3/uL (ref 4.0–10.5)

## 2015-03-24 LAB — GLUCOSE, CAPILLARY
GLUCOSE-CAPILLARY: 229 mg/dL — AB (ref 65–99)
Glucose-Capillary: 210 mg/dL — ABNORMAL HIGH (ref 65–99)
Glucose-Capillary: 217 mg/dL — ABNORMAL HIGH (ref 65–99)
Glucose-Capillary: 293 mg/dL — ABNORMAL HIGH (ref 65–99)

## 2015-03-24 MED ORDER — INSULIN GLARGINE 100 UNIT/ML ~~LOC~~ SOLN: 20.0000 [IU] | Freq: Every day | SUBCUTANEOUS | Status: DC

## 2015-03-24 MED ORDER — SODIUM CHLORIDE 0.9 % IV SOLN
2500.0000 mg | INTRAVENOUS | Status: DC
Start: 2015-03-24 — End: 2017-04-30

## 2015-03-24 MED ORDER — METRONIDAZOLE 500 MG PO TABS: 500.0000 mg | ORAL_TABLET | Freq: Three times a day (TID) | ORAL | Status: DC

## 2015-03-24 MED ORDER — INSULIN GLARGINE 100 UNIT/ML ~~LOC~~ SOLN
20.0000 [IU] | Freq: Every day | SUBCUTANEOUS | Status: DC
Start: 2015-03-24 — End: 2015-03-24
  Filled 2015-03-24: qty 0.2

## 2015-03-24 MED ORDER — VANCOMYCIN HCL 10 G IV SOLR
2500.0000 mg | INTRAVENOUS | Status: DC
  Administered 2015-03-24: 2500 mg via INTRAVENOUS
  Filled 2015-03-24 (×2): qty 2500

## 2015-03-24 MED ORDER — SODIUM CHLORIDE 0.9 % IJ SOLN
10.0000 mL | Freq: Two times a day (BID) | INTRAMUSCULAR | Status: DC
  Administered 2015-03-24: 10 mL

## 2015-03-24 MED ORDER — SODIUM CHLORIDE 0.9 % IJ SOLN: 10.0000 mL | INTRAMUSCULAR | Status: DC | PRN

## 2015-03-24 MED ORDER — "AQUACEL-AG EXTRA HYDROFIBER 4""X5"" EX PADS": MEDICATED_PAD | CUTANEOUS | Status: DC

## 2015-03-24 MED ORDER — METRONIDAZOLE 500 MG PO TABS
500.0000 mg | ORAL_TABLET | Freq: Three times a day (TID) | ORAL | Status: DC
  Administered 2015-03-24: 500 mg via ORAL
  Filled 2015-03-24: qty 1

## 2015-03-24 MED ORDER — BLOOD GLUCOSE MONITOR KIT: PACK | Status: DC

## 2015-03-24 MED ORDER — LEVOFLOXACIN 750 MG PO TABS
750.0000 mg | ORAL_TABLET | Freq: Every day | ORAL | Status: DC
  Administered 2015-03-24: 750 mg via ORAL
  Filled 2015-03-24: qty 1

## 2015-03-24 MED ORDER — CHLORTHALIDONE 25 MG PO TABS
25.0000 mg | ORAL_TABLET | Freq: Every day | ORAL | Status: DC
  Administered 2015-03-24: 25 mg via ORAL
  Filled 2015-03-24 (×2): qty 1

## 2015-03-24 MED ORDER — LEVOFLOXACIN 750 MG PO TABS: 750.0000 mg | ORAL_TABLET | Freq: Every day | ORAL | Status: DC

## 2015-03-24 NOTE — Progress Notes (Signed)
Patient with orders to be discharge home with home health. Orders, H&P, discharge summary, and facesheet faxed to [facility]. Patient demonstrated understanding of checking CBG, drawing up insulin, and injecting self. Discharge instructions given, patient verbalized understanding. Patient stable. Patient left in private vehicle with husband.

## 2015-03-24 NOTE — Discharge Summary (Addendum)
Physician Discharge Summary  SHARETA FISHBAUGH URK:[id number] DOB: August 16, 1951 DOA: 03/21/2015  PCP: Glo Herring., MD  Admit date: 03/21/2015 Discharge date: 03/24/2015  Recommendations for Outpatient Follow-up:  1. Left great toe osteomyelitis/septic arthritis. Patient refused surgical intervention. Plan for IV antibiotics and oral antibiotics as recommended by infectious disease, minimum 4 weeks. Will need Rx for treatment if extends to 6 weeks. Outpatient referral to wound care center.  2. Continue wound care per RN: "Cleanse left great toe with NS and pat gently dry. Gently fill wound bed to plantar toe with silver hydrofiber (Aquacel AG). Cover dorsal toe wound bed with Aquacel Ag. Change daily." 3. Home health RN for IV antibiotics and wound care. 4. Diabetes mellitus type 2, uncontrolled. Started on insulin (Lantus). 5. Recommend outpatient ABIs. 6. Vancomycin daily dosing initially, however probably will need twice daily dosing if vancomycin trough levels are not within range of 15-20 mcg/ml. Vancomycin trough level before 4th daily dose infused (6/15).  Goal of Therapy: Vancomycin trough level 15-20 mcg/ml  Vancomycin trough at steady state  Monitor renal function  Labs per protocol    Follow-up Information    Follow up with Glo Herring., MD. Schedule an appointment as soon as possible for a visit in 1 week.   Specialty:  Internal Medicine   Contact information:   [address] [place] [address] [phone]       Follow up with [facility].   Why:  Wound care center will call you for an appointment     Discharge Diagnoses:  1. Left great toe osteomyelitis/septic arthritis secondary to diabetic foot ulcer 2. Diabetes mellitus type 2, uncontrolled by hemoglobin A1c, with associated peripheral neuropathy 3. Hypertension 4. Tobacco dependence  Discharge Condition: improved Disposition: home  Diet recommendation: diabetic diet  Filed  Weights   03/21/15 2022 03/22/15 0100  Weight: 86.183 kg (190 lb) 90.629 kg (199 lb 12.8 oz)    History of present illness:  64 year old woman with history of diabetes mellitus with peripheral neuropathy, chronic left foot wound, initially followed by podiatry and now followed by the wound care center. She has had increasing drainage, was seen by her primary care physician started on ciprofloxacin and then clindamycin was added. Despite this she continued to worsen and presented to the emergency department where she was admitted for IV antibiotics and treatment of foot ulcer.  Hospital Course:  Ms. Madison Reed was started on broad-spectrum antibiotics and had clinical improvement in her wound. She was seen by wound care nurse with recommendations as outlined. She was also seen by general surgery but she declined operative intervention. The severity of her infection was explained to her and the potential loss of her toe. She wanted should pursue conservative management. After discussion with general surgery and infectious disease, plans were made for outpatient follow-up with the wound care center, IV and oral antibiotics. She was educated extensively on a diabetic diet, received insulin training from her bedside nurse, and because of her uncontrolled diabetes was started on insulin which she desires to take. Individual issues as below.  1. 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 Improving. Wound culture with enterococcus. Discussed with Dr. Megan Salon, not clear that this is pathogenic, may reflect colonization. Plan as below.  2. Diabetes mellitus type 2, with neuropathy, uncontrolled with hemoglobin A1c 10.1. Stable on insulin. Home on Lantus. 3. HTN. Continue losartan, consider adding chlorthalidone as an outpatient. 4. Tobacco dependence. Strongly encouraged cessation.   PICC line,  plan 4-6 weeks IV vancomycin. Oral Levaquin and Flagyl per discussion with Dr.  Megan Salon, infectious disease.  Continue wound care per RN: "Cleanse left great toe with NS and pat gently dry. Gently fill wound bed to plantar toe with silver hydrofiber (Aquacel AGKellie Simmering # [phone]). Cover dorsal toe wound bed with Aquacel Ag. Change daily."  Outpatient ABI recommended.  Outpatient follow-up with the wound care center in [place] will be arranged.  Increase Lantus to 20 units. RN teaching--patient gave self insulin today. Couldn't tolerate metformin secondary to h/o diarrhea. Diabetic diet. Glucometer Rx. Check CBG BID rotating times and keep a log.  Vancomycin daily dosing initially, however probably will need twice daily dosing if vancomycin trough levels are not within range of 15-20 mcg/ml. Vancomycin trough level before 4th daily dose infused.  Goal of Therapy: Vancomycin trough level 15-20 mcg/ml  Vancomycin trough at steady state  Monitor renal function  Labs per protocol  Consultants:  General surgery  Procedures:  PICC line 6/13 as outpatient (not available for today)  Antibiotics:  Oral Flagyl 6/9 >> 7/9 (minimum 4 weeks--consider extending to 6 weeks)  IV vancomycin 6/12 >> 7/9 (minimum 4 weeks--consider extending to 6 weeks)  Oral Levaquin 6/12 >> 7/9 (minimum 4 weeks--consider extending to 6 weeks)  Aztreonam 6/9 >> 6/12  Discharge Instructions  Discharge Instructions    Activity as tolerated - No restrictions    Complete by:  As directed      Diet Carb Modified    Complete by:  As directed      Discharge instructions    Complete by:  As directed   Call your physician or seek immediate medical attention for increased pain, fever, redness, swelling, drainage or worsening of condition.   Check your blood sugars twice a day on a rotating basis (morning, lunch, dinner, before bed) and keep a log. Call your physician for blood sugar less than 65 or greater than 400. Bring your log to all your appointments.   Return to [facility]/13 to have  a PICC line (an IV) placed so that you can continue to receive IV antibiotics at home.   You will be referred to the wound care center in [place] and they will call you.  To take care of your wounds, clean left great toe with normal saline solution and pat gently dry.Gently fill wound bed to bottom of toe with silver hydrofiber (Aquacel AG). Cover top of toe wound bed with Aquacel Ag. Change daily."          Current Discharge Medication List    START taking these medications   Details  blood glucose meter kit and supplies KIT Dispense based on patient and insurance preference. Use up to four times daily as directed. (FOR ICD-9 250.00, 250.01). Qty: 1 each, Refills: 0    insulin glargine (LANTUS) 100 UNIT/ML injection Inject 0.2 mLs (20 Units total) into the skin at bedtime. Qty: 10 mL, Refills: 1    levofloxacin (LEVAQUIN) 750 MG tablet Take 1 tablet (750 mg total) by mouth daily. Qty: 30 tablet, Refills: 0    metroNIDAZOLE (FLAGYL) 500 MG tablet Take 1 tablet (500 mg total) by mouth every 8 (eight) hours. Qty: 90 tablet, Refills: 0    Silver-Carboxymethylcellulose (AQUACEL-AG EXTRA HYDROFIBER) 4"X5" PADS Cut to fit and fill wounds on both sides of toe with this material and change daily. Qty: 10 each, Refills: 2    vancomycin 2,500 mg in sodium chloride 0.9 % 500 mL Inject  2,500 mg into the vein daily. Vancomycin daily dosing initially, however probably will need twice daily dosing if vancomycin trough levels are not within range of 15-20 mcg/ml. Vancomycin trough level before 4th daily dose infused.  Minimum therapy through 7/9. Goal of Therapy: Vancomycin trough level 15-20 mcg/ml Vancomycin trough at steady state Monitor renal function Labs per protocol      CONTINUE these medications which have NOT CHANGED   Details  aspirin 325 MG tablet Take 1 tablet (325 mg total) by mouth daily.    gabapentin (NEURONTIN) 100 MG capsule Take 2 capsules by mouth 3 (three) times daily.     glipiZIDE (GLUCOTROL) 10 MG tablet Take 10 mg by mouth 2 (two) times daily before a meal.    HYDROmorphone (DILAUDID) 4 MG tablet Take by mouth every 6 (six) hours as needed for severe pain.    losartan (COZAAR) 100 MG tablet Take 1 tablet by mouth daily.      STOP taking these medications     ciprofloxacin (CIPRO) 500 MG tablet      clindamycin (CLEOCIN) 150 MG capsule      DiphenhydrAMINE HCl (BENADRYL PO)      simvastatin (ZOCOR) 20 MG tablet        Allergies  Allergen Reactions  . Penicillins Anaphylaxis  . Codeine Itching  . Hydrocodone-Acetaminophen Itching  . Shellfish Allergy Itching  . Sulfa Antibiotics Itching and Rash    The results of significant diagnostics from this hospitalization (including imaging, microbiology, ancillary and laboratory) are listed below for reference.    Significant Diagnostic Studies: Mr Foot Left Wo Contrast  03/22/2015   CLINICAL DATA:  Nonhealing chronic ulcer of the left great toe. History of diabetes.  EXAM: MRI OF THE LEFT FOREFOOT WITHOUT CONTRAST  TECHNIQUE: Multiplanar, multisequence MR imaging was performed. No intravenous contrast was administered.  COMPARISON:  None.  FINDINGS: Significant patient motion throughout the examination degrading image quality limiting evaluation.  There is a soft tissue ulcer along the plantar aspect of the first IP joint extending down to the first IP joint. There is marrow edema involving the first proximal and distal phalanx and periarticular T1 signal abnormality, with a small joint effusion of the first IP joint most concerning for septic arthritis. No frank cortical destruction.  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 There is no other marrow signal abnormality. There is no fracture or dislocation. There is no focal drainable fluid collection. There is no hematoma. The visualized flexor, extensor and peroneal tendons are intact. There is mild soft tissue edema within the flexor digitorum  brevis muscle.  IMPRESSION: 1. Soft tissue ulcer along the plantar aspect of the first IP joint extending down to the first IP joint. Marrow edema involving the first proximal and distal phalanx and periarticular T1 signal abnormality, with a small joint effusion of the first IP joint most concerning for septic arthritis.   Electronically Signed   By: Kathreen Devoid   On: 03/22/2015 16:20   Dg Foot Complete Left  03/21/2015   CLINICAL DATA:  64 year old female with a history of ulcer left great toe.  EXAM: LEFT FOOT - COMPLETE 3+ VIEW  COMPARISON:  None.  FINDINGS: No acute bony abnormality. Posttraumatic deformity, now healed, of second and third metatarsals. Mild degenerative changes of the interphalangeal joints of the toes and of the midfoot.  Soft tissue swelling at the left great toe. No subcutaneous gas. No radiopaque foreign body.  Degenerative changes of the hindfoot.  Calcifications of the posterior tibial artery.  IMPRESSION: No acute bony abnormality identified.  Soft tissue swelling of the left great toe, without bony changes to suggest osteomyelitis on the study.  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 If the patient has not yet been evaluated for claudication/CLI, noninvasive testing with ABI, segmental duplex, and segmental pulse volume recording may be considered as well as office based evaluation.  Signed,  Dulcy Fanny. Earleen Newport, DO  [facility]  [facility]   Electronically Signed   By: Corrie Mckusick D.O.   On: 03/21/2015 21:35    Microbiology: Recent Results (from the past 240 hour(s))  Blood Cultures x 2 sites     Status: None (Preliminary result)   Collection Time: 03/21/15 12:00 AM  Result Value Ref Range Status   Specimen Description BLOOD LEFT FOREARM  Final   Special Requests   Final    BOTTLES DRAWN AEROBIC AND ANAEROBIC AEB 6CC ANA 4CC   Culture NO GROWTH 2 DAYS   Final   Report Status PENDING  Incomplete  Wound culture     Status: None   Collection Time: 03/21/15  9:40 PM  Result Value Ref Range Status   Specimen Description FOOT  Final   Special Requests Normal  Final   Gram Stain   Final    FEW WBC PRESENT, PREDOMINANTLY MONONUCLEAR NO SQUAMOUS EPITHELIAL CELLS SEEN FEW GRAM POSITIVE COCCI IN PAIRS IN CLUSTERS Performed at [facility]    Culture   Final    MODERATE ENTEROCOCCUS SPECIES Performed at [facility]    Report Status 03/24/2015 FINAL  Final   Organism ID, Bacteria ENTEROCOCCUS SPECIES  Final      Susceptibility   Enterococcus species - MIC*    VANCOMYCIN 1 SENSITIVE Sensitive     AMPICILLIN <=2 SENSITIVE Sensitive     GENTAMICIN SYNERGY SENSITIVE      * MODERATE ENTEROCOCCUS SPECIES  Blood Cultures x 2 sites     Status: None (Preliminary result)   Collection Time: 03/21/15 11:46 PM  Result Value Ref Range Status   Specimen Description BLOOD LEFT HAND  Final   Special Requests   Final    BOTTLES DRAWN AEROBIC AND ANAEROBIC AEB 6CC ANA 4CC   Culture NO GROWTH 2 DAYS  Final   Report Status PENDING  Incomplete     Labs: Basic Metabolic Panel:  Recent Labs Lab 03/21/15 2108 03/21/15 2346 03/22/15 0600 03/23/15 0611 03/24/15 0620  NA 134*  --  136 136 134*  K 3.0*  --  2.9* 4.0 4.0  CL 92*  --  95* 102 99*  CO2 32  --  _0 GLUCOSE 334*  --  243* 255* 243*  BUN 15  --  _1 CREATININE 1.09*  --  0.90 0.79 0.76  CALCIUM 8.8*  --  8.3* 8.1* 8.4*  MG  --  1.8  --   --   --    Liver Function Tests:  Recent Labs Lab 03/22/15 0600 03/23/15 0611 03/24/15 0620  AST _2 ALT _3 ALKPHOS 71 68 70  BILITOT 0.8 0.6 0.7  PROT 7.1 6.4* 6.8  ALBUMIN 2.9* 2.6* 2.8*   CBC:  Recent Labs Lab 03/21/15 2108 03/22/15 0600 03/23/15 0611 03/24/15 0620  WBC 11.2* 11.6* 7.8 8.5  NEUTROABS 8.7* 8.4* 5.1 5.7  HGB 13.4 12.4 12.0 12.4  HCT 39.7  38.0 37.4 37.4  MCV 87.8 88.0 89.5  88.4  PLT 287 301 269 256   CBG:  Recent Labs Lab 03/23/15 2029 03/24/15 0021 03/24/15 0553 03/24/15 0726 03/24/15 1129  GLUCAP 269* 229* 210* 217* 293*    Principal Problem:   Diabetic ulcer of left foot Active Problems:   Hypertension   Neuropathy   Psoriasis   Hypokalemia   DM type 2, uncontrolled, with lower extremity ulcer   Time coordinating discharge: 35 minutes  Signed:  Murray Hodgkins, MD [facility] 03/24/2015, 2:07 PM

## 2015-03-24 NOTE — Progress Notes (Signed)
ANTIBIOTIC CONSULT NOTE - INITIAL  Pharmacy Consult for Levaquin & Vancomycin Indication: osteomyelitis/diabetic foot ulcer  Allergies  Allergen Reactions  . Penicillins Anaphylaxis  . Codeine Itching  . Hydrocodone-Acetaminophen Itching  . Shellfish Allergy Itching  . Sulfa Antibiotics Itching and Rash    Patient Measurements: Height: 5\' 5"  (165.1 cm) Weight: 199 lb 12.8 oz (90.629 kg) IBW/kg (Calculated) : 57 Adjusted Body Weight:   Vital Signs: Temp: 98.9 F (37.2 C) (06/12 0600) Temp Source: Oral (06/12 0600) BP: 167/60 mmHg (06/12 0600) Pulse Rate: 79 (06/12 0600) Intake/Output from previous day: 06/11 0701 - 06/12 0700 In: 510 [P.O.:360; IV Piggyback:150] Out: -  Intake/Output from this shift: Total I/O In: 240 [P.O.:240] Out: -   Labs:  Recent Labs  03/22/15 0600 03/23/15 0611 03/24/15 0620  WBC 11.6* 7.8 8.5  HGB 12.4 12.0 12.4  PLT 301 269 256  CREATININE 0.90 0.79 0.76   Estimated Creatinine Clearance: 82.1 mL/min (by C-G formula based on Cr of 0.76). No results for input(s): VANCOTROUGH, VANCOPEAK, VANCORANDOM, GENTTROUGH, GENTPEAK, GENTRANDOM, TOBRATROUGH, TOBRAPEAK, TOBRARND, AMIKACINPEAK, AMIKACINTROU, AMIKACIN in the last 72 hours.   Microbiology: Recent Results (from the past 720 hour(s))  Blood Cultures x 2 sites     Status: None (Preliminary result)   Collection Time: 03/21/15 12:00 AM  Result Value Ref Range Status   Specimen Description BLOOD LEFT FOREARM  Final   Special Requests   Final    BOTTLES DRAWN AEROBIC AND ANAEROBIC AEB 6CC ANA 4CC   Culture NO GROWTH 2 DAYS  Final   Report Status PENDING  Incomplete  Wound culture     Status: None   Collection Time: 03/21/15  9:40 PM  Result Value Ref Range Status   Specimen Description FOOT  Final   Special Requests Normal  Final   Gram Stain   Final    FEW WBC PRESENT, PREDOMINANTLY MONONUCLEAR NO SQUAMOUS EPITHELIAL CELLS SEEN FEW GRAM POSITIVE COCCI IN PAIRS IN  CLUSTERS Performed at [facility]    Culture   Final    MODERATE ENTEROCOCCUS SPECIES Performed at [facility]    Report Status 03/24/2015 FINAL  Final   Organism ID, Bacteria ENTEROCOCCUS SPECIES  Final      Susceptibility   Enterococcus species - MIC*    VANCOMYCIN 1 SENSITIVE Sensitive     AMPICILLIN <=2 SENSITIVE Sensitive     GENTAMICIN SYNERGY SENSITIVE      * MODERATE ENTEROCOCCUS SPECIES  Blood Cultures x 2 sites     Status: None (Preliminary result)   Collection Time: 03/21/15 11:46 PM  Result Value Ref Range Status   Specimen Description BLOOD LEFT HAND  Final   Special Requests   Final    BOTTLES DRAWN AEROBIC AND ANAEROBIC AEB 6CC ANA 4CC   Culture NO GROWTH 2 DAYS  Final   Report Status PENDING  Incomplete    Medical History: Past Medical History  Diagnosis Date  . Diabetes mellitus without complication   . Hypertension   . Neuropathy   . Chronic back pain   . Bone spur   . Wound, open, foot   . Psoriasis   . Abdominal aneurysm   . Kidney stones   . CVA (cerebral infarction)     Medications:  Scheduled:  . aspirin  325 mg Oral Daily  . feeding supplement (PRO-STAT SUGAR FREE 64)  30 mL Oral BID  . gabapentin  200 mg Oral TID  . heparin  5,000 Units Subcutaneous 3  times per day  . insulin aspart  0-20 Units Subcutaneous TID WC  . insulin aspart  0-5 Units Subcutaneous QHS  . insulin glargine  15 Units Subcutaneous QHS  . levofloxacin  750 mg Oral Daily  . losartan  100 mg Oral Daily  . metroNIDAZOLE  500 mg Oral 3 times per day  . vancomycin  2,500 mg Intravenous Q24H   Assessment: 64 yo female history of DM, chronic left foot wound. Ciprofloxacin, clindamycin as outpatient. Plan 4 to 6 weeks of antibiotics as outpatient, PICC line placement Vancomycin daily dosing initially, however probably will need twice daily dosing if vancomycin trough levels are not within range of 15-20 mcg/ml. Vancomycin trough level before 4th daily dose  infused.   Goal of Therapy:  Vancomycin trough level 15-20 mcg/ml  Plan:  Levaquin 750 mg po daily Vancomycin 2500 mg IV every 24 hours Vancomycin trough at steady state Monitor renal function Labs per protocol  Abner Greenspan, Keshaun Dubey Bennett 03/24/2015,9:30 AM

## 2015-03-24 NOTE — Progress Notes (Signed)
PROGRESS NOTE  Madison Reed HQI:[id number] DOB: [birth year] DOA: 03/21/2015 PCP: Glo Herring., MD  Summary: 64 year old woman with history of diabetes mellitus with peripheral neuropathy, chronic left foot wound, initially followed by podiatry and now followed by the wound care center. She has had increasing drainage, was seen by her primary care physician started on ciprofloxacin and then clindamycin was added. Despite this she continued to worsen and presented to the emergency department.  Assessment/Plan: 1. 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 Improving.Wound culture with enterococcus. Discussed with Dr. Megan Salon, not clear that this is pathogenic, may reflect colonization. Plan as below.  2. Diabetes mellitus type 2, with neuropathy, uncontrolled with hemoglobin A1c 10.1. Stable. Home on Lantus. 3. HTN. Continue losartan, consider adding chlorthalidone as an outpatient. 4. Tobacco dependence. Strongly encouraged cessation.   Place PICC line, plan 4-6 weeks IV vancomycin. Oral Levaquin and Flagyl per discussion with Dr. Megan Salon, infectious disease.  Continue wound care per RN: "Cleanse left great toe with NS and pat gently dry. Gently fill wound bed to plantar toe with silver hydrofiber (Aquacel AGKellie Simmering # [phone]). Cover dorsal toe wound bed with Aquacel Ag. Change daily."  Outpatient ABI recommended.  Outpatient follow-up with the wound care center in [place] will be arranged.  Increase Lantus to 20 units. RN teaching--patient gave self insulin today. Couldn't tolerate metformin secondary to h/o diarrhea. Diabetic diet. Glucometer Rx. Check CBG BID rotating times and keep a log.  Home today with [facility] RN  Vancomycin daily dosing initially, however probably will need twice daily dosing if vancomycin trough levels are not within range of 15-20 mcg/ml. Vancomycin trough level before 4th daily dose infused.  Goal of Therapy: Vancomycin trough  level 15-20 mcg/ml  Vancomycin trough at steady state  Monitor renal function  Labs per protocol  Murray Hodgkins, MD  [facility]  Pager [phone] If 7PM-7AM, please contact night-coverage at [email or web address], password [facility] 03/24/2015, 10:44 AM  LOS: 3 days   Consultants:  General surgery  Procedures:  PICC line 6/13 as outpatient (not available for today)  Antibiotics:  Oral Flagyl 6/9 >> 7/9 (minimum 4 weeks--consider extending to 6 weeks)  IV vancomycin 6/12 >> 7/9 (minimum 4 weeks--consider extending to 6 weeks)  Oral Levaquin 6/12 >> 7/9 (minimum 4 weeks--consider extending to 6 weeks)  Aztreonam 6/9 >> 6/12  HPI/Subjective: Feels better. Doing well. Wants to go home.  Objective: Filed Vitals:   03/23/15 0418 03/23/15 1402 03/23/15 2147 03/24/15 0600  BP: 180/67 138/59 185/76 167/60  Pulse: 82 79 78 79  Temp: 99 F (37.2 C) 98.6 F (37 C) 98.9 F (37.2 C) 98.9 F (37.2 C)  TempSrc: Oral Oral Oral Oral  Resp: 20 20 20 20   Height:      Weight:      SpO2: 95% 94% 96% 93%    Intake/Output Summary (Last 24 hours) at 03/24/15 1044 Last data filed at 03/24/15 0845  Gross per 24 hour  Intake    360 ml  Output      0 ml  Net    360 ml     Filed Weights   03/21/15 2022 03/22/15 0100  Weight: 86.183 kg (190 lb) 90.629 kg (199 lb 12.8 oz)    Exam:    Afebrile, VSS, no hypoxia General:  Appears comfortable, calm. Cardiovascular: Regular rate and rhythm, no murmur, rub or gallop. Respiratory: Clear to auscultation bilaterally, no wheezes, rales or rhonchi. Normal respiratory effort. Skin: left toe with resolution  of edema. Dorsal ulcer with mild exudate. Plantar ulcer stable. Mild erythema over dorsum of foot. Foot well-perfused.  Psychiatric: grossly normal mood and affect, speech fluent and appropriate  New data reviewed:  Blood sugars 825K  Basic metabolic panel unremarkable, hepatic function panel unremarkable  CBC unremarkable  Wound  culture--Enterococcus. Blood cultures pending  Hemoglobin A1c 10.1  MRI showed marrow edema, small joint effusion first IP joint left great toe concerning for septic arthritis.  Pertinent data since admission:    Pending data:    Scheduled Meds: . aspirin  325 mg Oral Daily  . feeding supplement (PRO-STAT SUGAR FREE 64)  30 mL Oral BID  . gabapentin  200 mg Oral TID  . heparin  5,000 Units Subcutaneous 3 times per day  . insulin aspart  0-20 Units Subcutaneous TID WC  . insulin aspart  0-5 Units Subcutaneous QHS  . insulin glargine  15 Units Subcutaneous QHS  . levofloxacin  750 mg Oral Daily  . losartan  100 mg Oral Daily  . metroNIDAZOLE  500 mg Oral 3 times per day  . vancomycin  2,500 mg Intravenous Q24H   Continuous Infusions:   Principal Problem:   Diabetic ulcer of left foot Active Problems:   Hypertension   Neuropathy   Psoriasis   Hypokalemia   DM type 2, uncontrolled, with lower extremity ulcer

## 2015-03-25 ENCOUNTER — Ambulatory Visit (HOSPITAL_COMMUNITY): Payer: 59 | Admitting: Physical Therapy

## 2015-03-25 ENCOUNTER — Telehealth (HOSPITAL_COMMUNITY): Payer: Self-pay | Admitting: Physical Therapy

## 2015-03-25 NOTE — Telephone Encounter (Signed)
She just got out of the hospital and  she will get home health care for the next 4-6 weeks 2xs a week. per [place]. NF

## 2015-03-27 ENCOUNTER — Ambulatory Visit (HOSPITAL_COMMUNITY): Payer: 59 | Admitting: Physical Therapy

## 2015-03-27 ENCOUNTER — Other Ambulatory Visit (HOSPITAL_COMMUNITY)
Admission: AD | Admit: 2015-03-27 | Discharge: 2015-03-27 | Disposition: A | Discharge status: Home / Self Care | Payer: 59 | Source: Other Acute Inpatient Hospital | Attending: Family Medicine | Admitting: Family Medicine

## 2015-03-27 DIAGNOSIS — Z5181 Encounter for therapeutic drug level monitoring: Secondary | ICD-10-CM | POA: Diagnosis present

## 2015-03-27 LAB — CBC WITH DIFFERENTIAL/PLATELET
BASOS ABS: 0.1 10*3/uL (ref 0.0–0.1)
BASOS PCT: 1 % (ref 0–1)
Eosinophils Absolute: 0.2 10*3/uL (ref 0.0–0.7)
Eosinophils Relative: 2 % (ref 0–5)
HEMATOCRIT: 40.5 % (ref 36.0–46.0)
Hemoglobin: 13.1 g/dL (ref 12.0–15.0)
LYMPHS PCT: 19 % (ref 12–46)
Lymphs Abs: 2.6 10*3/uL (ref 0.7–4.0)
MCH: 29.2 pg (ref 26.0–34.0)
MCHC: 32.3 g/dL (ref 30.0–36.0)
MCV: 90.4 fL (ref 78.0–100.0)
MONO ABS: 1.1 10*3/uL — AB (ref 0.1–1.0)
Monocytes Relative: 8 % (ref 3–12)
NEUTROS ABS: 9.5 10*3/uL — AB (ref 1.7–7.7)
Neutrophils Relative %: 70 % (ref 43–77)
PLATELETS: 289 10*3/uL (ref 150–400)
RBC: 4.48 MIL/uL (ref 3.87–5.11)
RDW: 16.1 % — ABNORMAL HIGH (ref 11.5–15.5)
WBC: 13.5 10*3/uL — AB (ref 4.0–10.5)

## 2015-03-27 LAB — CULTURE, BLOOD (ROUTINE X 2)
CULTURE: NO GROWTH
Culture: NO GROWTH

## 2015-03-27 LAB — CREATININE, SERUM
Creatinine, Ser: 0.98 mg/dL (ref 0.44–1.00)
GFR calc Af Amer: >60 mL/min (ref >60)

## 2015-03-27 LAB — VANCOMYCIN, TROUGH: Vancomycin Tr: 22 ug/mL — ABNORMAL HIGH (ref 10.0–20.0)

## 2015-03-27 LAB — BUN: BUN: 22 mg/dL — AB (ref 6–20)

## 2015-03-29 ENCOUNTER — Ambulatory Visit: Payer: 59 | Admitting: Urology

## 2015-04-01 ENCOUNTER — Ambulatory Visit (HOSPITAL_COMMUNITY): Payer: 59 | Admitting: Physical Therapy

## 2015-04-03 ENCOUNTER — Ambulatory Visit (HOSPITAL_COMMUNITY): Payer: 59 | Admitting: Physical Therapy

## 2015-04-04 ENCOUNTER — Other Ambulatory Visit (HOSPITAL_COMMUNITY)
Admission: RE | Admit: 2015-04-04 | Discharge: 2015-04-04 | Disposition: A | Discharge status: Home / Self Care | Payer: 59 | Source: Other Acute Inpatient Hospital | Attending: Internal Medicine | Admitting: Internal Medicine

## 2015-04-04 DIAGNOSIS — Z5181 Encounter for therapeutic drug level monitoring: Secondary | ICD-10-CM | POA: Insufficient documentation

## 2015-04-04 LAB — CREATININE, SERUM
Creatinine, Ser: 0.92 mg/dL (ref 0.44–1.00)
GFR calc Af Amer: >60 mL/min (ref >60)

## 2015-04-04 LAB — VANCOMYCIN, TROUGH: Vancomycin Tr: 14 ug/mL (ref 10.0–20.0)

## 2015-04-04 LAB — BUN: BUN: 15 mg/dL (ref 6–20)

## 2015-04-08 ENCOUNTER — Ambulatory Visit (HOSPITAL_COMMUNITY): Payer: 59 | Admitting: Physical Therapy

## 2015-04-10 ENCOUNTER — Ambulatory Visit (HOSPITAL_COMMUNITY): Payer: 59 | Admitting: Physical Therapy

## 2015-04-15 ENCOUNTER — Encounter (HOSPITAL_COMMUNITY)
Admission: RE | Admit: 2015-04-15 | Discharge: 2015-04-15 | Disposition: A | Discharge status: Home / Self Care | Payer: 59 | Source: Other Acute Inpatient Hospital | Attending: Internal Medicine | Admitting: Internal Medicine

## 2015-04-15 DIAGNOSIS — Z5181 Encounter for therapeutic drug level monitoring: Secondary | ICD-10-CM | POA: Insufficient documentation

## 2015-04-15 LAB — CBC WITH DIFFERENTIAL/PLATELET
BASOS ABS: 0.1 10*3/uL (ref 0.0–0.1)
BASOS PCT: 1 % (ref 0–1)
Eosinophils Absolute: 0.2 10*3/uL (ref 0.0–0.7)
Eosinophils Relative: 3 % (ref 0–5)
HEMATOCRIT: 40.2 % (ref 36.0–46.0)
HEMOGLOBIN: 13.1 g/dL (ref 12.0–15.0)
LYMPHS ABS: 1.3 10*3/uL (ref 0.7–4.0)
LYMPHS PCT: 16 % (ref 12–46)
MCH: 29 pg (ref 26.0–34.0)
MCHC: 32.6 g/dL (ref 30.0–36.0)
MCV: 88.9 fL (ref 78.0–100.0)
Monocytes Absolute: 0.5 10*3/uL (ref 0.1–1.0)
Monocytes Relative: 6 % (ref 3–12)
NEUTROS PCT: 74 % (ref 43–77)
Neutro Abs: 5.9 10*3/uL (ref 1.7–7.7)
Platelets: 226 10*3/uL (ref 150–400)
RBC: 4.52 MIL/uL (ref 3.87–5.11)
RDW: 16 % — ABNORMAL HIGH (ref 11.5–15.5)
WBC: 8 10*3/uL (ref 4.0–10.5)

## 2015-04-15 LAB — CREATININE, SERUM
Creatinine, Ser: 0.9 mg/dL (ref 0.44–1.00)
GFR calc Af Amer: >60 mL/min (ref >60)
GFR calc non Af Amer: >60 mL/min (ref >60)

## 2015-04-15 LAB — VANCOMYCIN, TROUGH: VANCOMYCIN TR: 17 ug/mL (ref 10.0–20.0)

## 2015-04-15 LAB — BUN: BUN: 15 mg/dL (ref 6–20)

## 2015-04-16 ENCOUNTER — Ambulatory Visit (HOSPITAL_COMMUNITY): Payer: 59 | Admitting: Physical Therapy

## 2015-04-18 ENCOUNTER — Ambulatory Visit (HOSPITAL_COMMUNITY): Payer: 59 | Admitting: Physical Therapy

## 2015-04-22 ENCOUNTER — Ambulatory Visit (HOSPITAL_COMMUNITY): Payer: 59 | Admitting: Physical Therapy

## 2015-04-24 ENCOUNTER — Ambulatory Visit (HOSPITAL_COMMUNITY): Payer: 59 | Admitting: Physical Therapy

## 2015-04-29 ENCOUNTER — Ambulatory Visit (HOSPITAL_COMMUNITY): Payer: 59 | Admitting: Physical Therapy

## 2015-05-01 ENCOUNTER — Ambulatory Visit (HOSPITAL_COMMUNITY): Payer: 59 | Admitting: Physical Therapy

## 2015-05-05 ENCOUNTER — Other Ambulatory Visit (HOSPITAL_COMMUNITY)
Admission: RE | Admit: 2015-05-05 | Discharge: 2015-05-05 | Disposition: A | Discharge status: Home / Self Care | Payer: 59 | Source: Other Acute Inpatient Hospital | Attending: Internal Medicine | Admitting: Internal Medicine

## 2015-05-05 DIAGNOSIS — Z5181 Encounter for therapeutic drug level monitoring: Secondary | ICD-10-CM | POA: Insufficient documentation

## 2015-05-05 LAB — CBC WITH DIFFERENTIAL/PLATELET
Basophils Absolute: 0 10*3/uL (ref 0.0–0.1)
Basophils Relative: 0 % (ref 0–1)
EOS ABS: 0.3 10*3/uL (ref 0.0–0.7)
Eosinophils Relative: 3 % (ref 0–5)
HEMATOCRIT: 39.9 % (ref 36.0–46.0)
HEMOGLOBIN: 12.6 g/dL (ref 12.0–15.0)
Lymphocytes Relative: 20 % (ref 12–46)
Lymphs Abs: 1.9 10*3/uL (ref 0.7–4.0)
MCH: 28.7 pg (ref 26.0–34.0)
MCHC: 31.6 g/dL (ref 30.0–36.0)
MCV: 90.9 fL (ref 78.0–100.0)
MONO ABS: 0.7 10*3/uL (ref 0.1–1.0)
MONOS PCT: 7 % (ref 3–12)
NEUTROS ABS: 6.6 10*3/uL (ref 1.7–7.7)
Neutrophils Relative %: 70 % (ref 43–77)
Platelets: 214 10*3/uL (ref 150–400)
RBC: 4.39 MIL/uL (ref 3.87–5.11)
RDW: 16.7 % — ABNORMAL HIGH (ref 11.5–15.5)
WBC: 9.4 10*3/uL (ref 4.0–10.5)

## 2015-05-05 LAB — BUN: BUN: 18 mg/dL (ref 6–20)

## 2015-05-05 LAB — VANCOMYCIN, TROUGH: VANCOMYCIN TR: 14 ug/mL (ref 10.0–20.0)

## 2015-05-05 LAB — CREATININE, SERUM
CREATININE: 0.85 mg/dL (ref 0.44–1.00)
GFR calc Af Amer: >60 mL/min (ref >60)

## 2015-05-06 ENCOUNTER — Ambulatory Visit (HOSPITAL_COMMUNITY): Payer: 59 | Admitting: Physical Therapy

## 2015-05-08 ENCOUNTER — Ambulatory Visit (HOSPITAL_COMMUNITY): Payer: 59 | Admitting: Physical Therapy

## 2015-05-23 ENCOUNTER — Other Ambulatory Visit (HOSPITAL_COMMUNITY)
Admission: RE | Admit: 2015-05-23 | Discharge: 2015-05-23 | Disposition: A | Discharge status: Home / Self Care | Payer: 59 | Source: Other Acute Inpatient Hospital | Attending: Surgery | Admitting: Surgery

## 2015-05-23 ENCOUNTER — Encounter (HOSPITAL_COMMUNITY): Payer: Self-pay | Admitting: Emergency Medicine

## 2015-05-23 ENCOUNTER — Other Ambulatory Visit: Payer: Self-pay

## 2015-05-23 ENCOUNTER — Emergency Department (HOSPITAL_COMMUNITY)
Admission: EM | Admit: 2015-05-23 | Discharge: 2015-05-24 | Disposition: A | Discharge status: Home / Self Care | Payer: 59 | Attending: Emergency Medicine | Admitting: Emergency Medicine

## 2015-05-23 DIAGNOSIS — Z872 Personal history of diseases of the skin and subcutaneous tissue: Secondary | ICD-10-CM | POA: Diagnosis not present

## 2015-05-23 DIAGNOSIS — M8600 Acute hematogenous osteomyelitis, unspecified site: Secondary | ICD-10-CM

## 2015-05-23 DIAGNOSIS — Z794 Long term (current) use of insulin: Secondary | ICD-10-CM | POA: Diagnosis not present

## 2015-05-23 DIAGNOSIS — Z880 Allergy status to penicillin: Secondary | ICD-10-CM | POA: Diagnosis not present

## 2015-05-23 DIAGNOSIS — E119 Type 2 diabetes mellitus without complications: Secondary | ICD-10-CM | POA: Diagnosis not present

## 2015-05-23 DIAGNOSIS — Z87442 Personal history of urinary calculi: Secondary | ICD-10-CM | POA: Diagnosis not present

## 2015-05-23 DIAGNOSIS — Z79899 Other long term (current) drug therapy: Secondary | ICD-10-CM | POA: Insufficient documentation

## 2015-05-23 DIAGNOSIS — G629 Polyneuropathy, unspecified: Secondary | ICD-10-CM | POA: Insufficient documentation

## 2015-05-23 DIAGNOSIS — Z8673 Personal history of transient ischemic attack (TIA), and cerebral infarction without residual deficits: Secondary | ICD-10-CM | POA: Insufficient documentation

## 2015-05-23 DIAGNOSIS — Z7982 Long term (current) use of aspirin: Secondary | ICD-10-CM | POA: Diagnosis not present

## 2015-05-23 DIAGNOSIS — I10 Essential (primary) hypertension: Secondary | ICD-10-CM | POA: Diagnosis not present

## 2015-05-23 DIAGNOSIS — R799 Abnormal finding of blood chemistry, unspecified: Secondary | ICD-10-CM | POA: Diagnosis present

## 2015-05-23 DIAGNOSIS — G8929 Other chronic pain: Secondary | ICD-10-CM | POA: Insufficient documentation

## 2015-05-23 DIAGNOSIS — Z87891 Personal history of nicotine dependence: Secondary | ICD-10-CM | POA: Diagnosis not present

## 2015-05-23 DIAGNOSIS — E876 Hypokalemia: Secondary | ICD-10-CM

## 2015-05-23 LAB — COMPREHENSIVE METABOLIC PANEL
ALK PHOS: 78 U/L (ref 38–126)
ALT: 19 U/L (ref 14–54)
ALT: 21 U/L (ref 14–54)
AST: 18 U/L (ref 15–41)
AST: 24 U/L (ref 15–41)
Albumin: 3.3 g/dL — ABNORMAL LOW (ref 3.5–5.0)
Albumin: 3.6 g/dL (ref 3.5–5.0)
Alkaline Phosphatase: 84 U/L (ref 38–126)
Anion gap: 8 (ref 5–15)
Anion gap: 11 (ref 5–15)
BUN: 18 mg/dL (ref 6–20)
BUN: 19 mg/dL (ref 6–20)
CALCIUM: 9.5 mg/dL (ref 8.9–10.3)
CHLORIDE: 96 mmol/L — AB (ref 101–111)
CO2: 34 mmol/L — AB (ref 22–32)
CO2: 29 mmol/L (ref 22–32)
CREATININE: 0.87 mg/dL (ref 0.44–1.00)
Calcium: 9 mg/dL (ref 8.9–10.3)
Chloride: 96 mmol/L — ABNORMAL LOW (ref 101–111)
Creatinine, Ser: 0.95 mg/dL (ref 0.44–1.00)
GFR calc Af Amer: >60 mL/min (ref >60)
GFR calc non Af Amer: >60 mL/min (ref >60)
GLUCOSE: 222 mg/dL — AB (ref 65–99)
Glucose, Bld: 226 mg/dL — ABNORMAL HIGH (ref 65–99)
POTASSIUM: 2.5 mmol/L — AB (ref 3.5–5.1)
Potassium: 2.9 mmol/L — ABNORMAL LOW (ref 3.5–5.1)
Sodium: 138 mmol/L (ref 135–145)
Sodium: 136 mmol/L (ref 135–145)
TOTAL PROTEIN: 7.8 g/dL (ref 6.5–8.1)
Total Bilirubin: 0.8 mg/dL (ref 0.3–1.2)
Total Bilirubin: 0.8 mg/dL (ref 0.3–1.2)
Total Protein: 7.5 g/dL (ref 6.5–8.1)

## 2015-05-23 LAB — CBC WITH DIFFERENTIAL/PLATELET
BASOS PCT: 0 % (ref 0–1)
Basophils Absolute: 0.1 10*3/uL (ref 0.0–0.1)
EOS PCT: 2 % (ref 0–5)
Eosinophils Absolute: 0.2 10*3/uL (ref 0.0–0.7)
HEMATOCRIT: 38.6 % (ref 36.0–46.0)
Hemoglobin: 13 g/dL (ref 12.0–15.0)
LYMPHS ABS: 2 10*3/uL (ref 0.7–4.0)
Lymphocytes Relative: 17 % (ref 12–46)
MCH: 29.1 pg (ref 26.0–34.0)
MCHC: 33.7 g/dL (ref 30.0–36.0)
MCV: 86.5 fL (ref 78.0–100.0)
Monocytes Absolute: 0.6 10*3/uL (ref 0.1–1.0)
Monocytes Relative: 5 % (ref 3–12)
Neutro Abs: 8.8 10*3/uL — ABNORMAL HIGH (ref 1.7–7.7)
Neutrophils Relative %: 76 % (ref 43–77)
PLATELETS: 222 10*3/uL (ref 150–400)
RBC: 4.46 MIL/uL (ref 3.87–5.11)
RDW: 16.6 % — ABNORMAL HIGH (ref 11.5–15.5)
WBC: 11.6 10*3/uL — AB (ref 4.0–10.5)

## 2015-05-23 LAB — CBC
HEMATOCRIT: 38.9 % (ref 36.0–46.0)
Hemoglobin: 12.9 g/dL (ref 12.0–15.0)
MCH: 29.5 pg (ref 26.0–34.0)
MCHC: 33.2 g/dL (ref 30.0–36.0)
MCV: 88.8 fL (ref 78.0–100.0)
Platelets: 240 10*3/uL (ref 150–400)
RBC: 4.38 MIL/uL (ref 3.87–5.11)
RDW: 16.9 % — AB (ref 11.5–15.5)
WBC: 9.9 10*3/uL (ref 4.0–10.5)

## 2015-05-23 LAB — SEDIMENTATION RATE: SED RATE: 70 mm/h — AB (ref 0–22)

## 2015-05-23 MED ORDER — POTASSIUM CHLORIDE CRYS ER 20 MEQ PO TBCR
40.0000 meq | EXTENDED_RELEASE_TABLET | Freq: Once | ORAL | Status: AC
  Administered 2015-05-24: 40 meq via ORAL
  Filled 2015-05-23: qty 2

## 2015-05-23 MED ORDER — POTASSIUM CHLORIDE ER 20 MEQ PO TBCR: 20.0000 meq | EXTENDED_RELEASE_TABLET | Freq: Every day | ORAL | Status: DC

## 2015-05-23 NOTE — ED Provider Notes (Signed)
This chart was scribed for [place], DO by Hansel Feinstein, ED Scribe. This patient was seen in room APA10/APA10 and the patient's care was started at 11:21 PM.    TIME SEEN: 11:21 PM   CHIEF COMPLAINT:  Chief Complaint  Patient presents with  . Abnormal Lab     HPI: HPI Comments: Madison Reed is a 64 y.o. female hypertension, diabetes who presents to the Emergency Department complaining of an abnormal lab result this morning. Per pt, she received a call stating that her potassium was low, at 2.5. She states she has never had this problem before and has never taken potassium supplements. Pt notes she had blood work done due to a diabetic ulcer. Pt is asymptomatic and denies CP, palpitations, lightheadedness, vomiting, diarrhea.    ROS: See HPI Constitutional: no fever  Eyes: no drainage  ENT: no runny nose   Cardiovascular:  no chest pain, no palpitations   Resp: no SOB  GI: no vomiting, no diarrhea  GU: no dysuria Integumentary: no rash  Allergy: no hives  Musculoskeletal: no leg swelling  Neurological: no slurred speech, no lightheadedness  ROS otherwise negative  PAST MEDICAL HISTORY/PAST SURGICAL HISTORY:  Past Medical History  Diagnosis Date  . Diabetes mellitus without complication   . Hypertension   . Neuropathy   . Chronic back pain   . Bone spur   . Wound, open, foot   . Psoriasis   . Abdominal aneurysm   . Kidney stones   . CVA (cerebral infarction)     MEDICATIONS:  Prior to Admission medications   Medication Sig Start Date End Date Taking? Authorizing Provider  aspirin 325 MG tablet Take 1 tablet (325 mg total) by mouth daily. 08/04/13  Yes Lezlie Octave Black, NP  gabapentin (NEURONTIN) 100 MG capsule Take 2 capsules by mouth 3 (three) times daily. 07/31/13  Yes Historical Provider, MD  glipiZIDE (GLUCOTROL) 10 MG tablet Take 10 mg by mouth 2 (two) times daily before a meal.   Yes Historical Provider, MD  HYDROmorphone (DILAUDID) 4 MG tablet Take 2 mg by  mouth every 6 (six) hours as needed for moderate pain or severe pain.    Yes Historical Provider, MD  insulin glargine (LANTUS) 100 UNIT/ML injection Inject 0.2 mLs (20 Units total) into the skin at bedtime. 03/24/15  Yes Samuella Cota, MD  losartan-hydrochlorothiazide (HYZAAR) 100-25 MG per tablet Take 1 tablet by mouth daily.   Yes Historical Provider, MD  Silver-Carboxymethylcellulose (AQUACEL-AG EXTRA HYDROFIBER) 4"X5" PADS Cut to fit and fill wounds on both sides of toe with this material and change daily. Patient taking differently: Apply 1 application topically See admin instructions. Cut to fit and fill wounds on both sides of toe with this material and change daily. 03/24/15  Yes Samuella Cota, MD  blood glucose meter kit and supplies KIT Dispense based on patient and insurance preference. Use up to four times daily as directed. (FOR ICD-9 250.00, 250.01). 03/24/15   Samuella Cota, MD  levofloxacin (LEVAQUIN) 750 MG tablet Take 1 tablet (750 mg total) by mouth daily. Patient not taking: Reported on 05/23/2015 03/24/15   Samuella Cota, MD  metroNIDAZOLE (FLAGYL) 500 MG tablet Take 1 tablet (500 mg total) by mouth every 8 (eight) hours. Patient not taking: Reported on 05/23/2015 03/24/15   Samuella Cota, MD  vancomycin 2,500 mg in sodium chloride 0.9 % 500 mL Inject 2,500 mg into the vein daily. Vancomycin daily dosing initially, however probably  will need twice daily dosing if vancomycin trough levels are not within range of 15-20 mcg/ml. Vancomycin trough level before 4th daily dose infused.  Minimum therapy through 7/9. Goal of Therapy: Vancomycin trough level 15-20 mcg/ml Vancomycin trough at steady state Monitor renal function Labs per protocol Patient not taking: Reported on 05/23/2015 03/24/15   Samuella Cota, MD    ALLERGIES:  Allergies  Allergen Reactions  . Penicillins Itching  . Codeine Itching  . Hydrocodone-Acetaminophen Itching  . Shellfish Allergy  Itching  . Sulfa Antibiotics Itching and Rash    SOCIAL HISTORY:  Social History  Substance Use Topics  . Smoking status: Former Smoker -- 0.00 packs/day for 30 years  . Smokeless tobacco: Never Used  . Alcohol Use: No    FAMILY HISTORY: Family History  Problem Relation Age of Onset  . Aneurysm Mother   . Diabetes Mother   . Hypertension Mother     EXAM: BP 149/79 mmHg  Pulse 99  Temp(Src) 98.4 F (36.9 C) (Oral)  Resp 20  Ht 5' 6"  (1.676 m)  Wt 180 lb (81.647 kg)  BMI 29.07 kg/m2  SpO2 97% CONSTITUTIONAL: Alert and oriented and responds appropriately to questions. Well-appearing; well-nourished HEAD: Normocephalic EYES: Conjunctivae clear, PERRL ENT: normal nose; no rhinorrhea; moist mucous membranes; pharynx without lesions noted NECK: Supple, no meningismus, no LAD  CARD: RRR; S1 and S2 appreciated; no murmurs, no clicks, no rubs, no gallops RESP: Normal chest excursion without splinting or tachypnea; breath sounds clear and equal bilaterally; no wheezes, no rhonchi, no rales, no hypoxia or respiratory distress, speaking full sentences ABD/GI: Normal bowel sounds; non-distended; soft, non-tender, no rebound, no guarding, no peritoneal signs BACK:  The back appears normal and is non-tender to palpation, there is no CVA tenderness EXT: Normal ROM in all joints; non-tender to palpation; no edema; normal capillary refill; no cyanosis, no calf tenderness or swelling; left foot ulcer that is wrapped SKIN: Normal color for age and race; warm NEURO: Moves all extremities equally, sensation to light touch intact diffusely, cranial nerves II through XII intact PSYCH: The patient's mood and manner are appropriate. Grooming and personal hygiene are appropriate.  MEDICAL DECISION MAKING: Patient here for hyperkalemia. Was found to have potassium of 2.5. EKG shows no interval changes, arrhythmia. She is asymptomatic. Repeat potassium today is 2.9. Will replace with oral potassium and  sent home with prescription for the same. Have advised her to have her primary care physician recheck her potassium in one week. Have discussed usual and customary return precautions. She verbalized understanding and is comfortable with this plan.       EKG Interpretation  Date/Time:  Thursday May 23 2015 23:16:55 EDT Ventricular Rate:  87 PR Interval:  188 QRS Duration: 86 QT Interval:  383 QTC Calculation: 461 R Axis:   88 Text Interpretation:  Sinus rhythm Consider left atrial enlargement Borderline right axis deviation No significant change since last tracing Confirmed by Demica Zook,  DO, Ciera Beckum [phone]) on 05/23/2015 11:29:21 PM       I personally performed the services described in this documentation, which was scribed in my presence. The recorded information has been reviewed and is accurate.   [place], DO 05/24/15 0022

## 2015-05-23 NOTE — Discharge Instructions (Signed)

## 2015-05-23 NOTE — ED Notes (Signed)
Patient reports [facility] called her stating that her potassium level came back as 2.5. Patient reports blood work was drawn this morning.

## 2015-05-24 NOTE — ED Notes (Signed)
Pt alert & oriented x4, stable gait. Patient given discharge instructions, paperwork & prescription(s). Patient  instructed to stop at the registration desk to finish any additional paperwork. Patient verbalized understanding. Pt left department w/ no further questions. 

## 2015-05-27 LAB — WOUND CULTURE: Gram Stain: NONE SEEN

## 2015-06-14 DIAGNOSIS — E119 Type 2 diabetes mellitus without complications: Secondary | ICD-10-CM | POA: Diagnosis not present

## 2015-06-14 DIAGNOSIS — E785 Hyperlipidemia, unspecified: Secondary | ICD-10-CM | POA: Diagnosis not present

## 2015-06-14 DIAGNOSIS — E876 Hypokalemia: Secondary | ICD-10-CM | POA: Diagnosis not present

## 2015-06-14 DIAGNOSIS — I10 Essential (primary) hypertension: Secondary | ICD-10-CM | POA: Diagnosis not present

## 2015-07-15 DIAGNOSIS — L409 Psoriasis, unspecified: Secondary | ICD-10-CM | POA: Diagnosis not present

## 2015-07-15 DIAGNOSIS — G894 Chronic pain syndrome: Secondary | ICD-10-CM | POA: Diagnosis not present

## 2015-09-16 DIAGNOSIS — J019 Acute sinusitis, unspecified: Secondary | ICD-10-CM | POA: Diagnosis not present

## 2015-09-25 DIAGNOSIS — E119 Type 2 diabetes mellitus without complications: Secondary | ICD-10-CM | POA: Diagnosis not present

## 2015-09-25 DIAGNOSIS — E782 Mixed hyperlipidemia: Secondary | ICD-10-CM | POA: Diagnosis not present

## 2015-09-25 DIAGNOSIS — E876 Hypokalemia: Secondary | ICD-10-CM | POA: Diagnosis not present

## 2015-09-25 DIAGNOSIS — N390 Urinary tract infection, site not specified: Secondary | ICD-10-CM | POA: Diagnosis not present

## 2015-09-25 DIAGNOSIS — I10 Essential (primary) hypertension: Secondary | ICD-10-CM | POA: Diagnosis not present

## 2015-12-10 DIAGNOSIS — N390 Urinary tract infection, site not specified: Secondary | ICD-10-CM | POA: Diagnosis not present

## 2016-01-31 DIAGNOSIS — E119 Type 2 diabetes mellitus without complications: Secondary | ICD-10-CM | POA: Diagnosis not present

## 2016-01-31 DIAGNOSIS — I10 Essential (primary) hypertension: Secondary | ICD-10-CM | POA: Diagnosis not present

## 2016-01-31 DIAGNOSIS — E782 Mixed hyperlipidemia: Secondary | ICD-10-CM | POA: Diagnosis not present

## 2016-02-04 DIAGNOSIS — E119 Type 2 diabetes mellitus without complications: Secondary | ICD-10-CM | POA: Diagnosis not present

## 2016-02-04 DIAGNOSIS — I10 Essential (primary) hypertension: Secondary | ICD-10-CM | POA: Diagnosis not present

## 2016-02-04 DIAGNOSIS — B372 Candidiasis of skin and nail: Secondary | ICD-10-CM | POA: Diagnosis not present

## 2016-02-04 DIAGNOSIS — E782 Mixed hyperlipidemia: Secondary | ICD-10-CM | POA: Diagnosis not present

## 2016-02-24 ENCOUNTER — Encounter (HOSPITAL_COMMUNITY): Payer: Self-pay | Admitting: Physical Therapy

## 2016-02-24 NOTE — Therapy (Signed)
[facility] [place], [place], [address] Phone: [phone]   Fax:  [phone]  Patient Details  Name: Madison Reed MRN: [id number] Date of Birth: 06-16-1951 Referring Provider:  No ref. provider found  Encounter Date: 02/24/2016   PHYSICAL THERAPY DISCHARGE SUMMARY  Visits from Start of Care: 6  Current functional level related to goals / functional outcomes: Patient has not returned since last skilled session    Remaining deficits: Unable to assess    Education / Equipment: N/A  Plan: Patient agrees to discharge.  Patient goals were not met. Patient is being discharged due to not returning since the last visit.  ?????        Deniece Ree PT, DPT [facility] [address] [place], [place], [address] Phone: [phone]   Fax:  [phone]

## 2016-05-26 DIAGNOSIS — E782 Mixed hyperlipidemia: Secondary | ICD-10-CM | POA: Diagnosis not present

## 2016-05-26 DIAGNOSIS — E119 Type 2 diabetes mellitus without complications: Secondary | ICD-10-CM | POA: Diagnosis not present

## 2016-07-10 DIAGNOSIS — L409 Psoriasis, unspecified: Secondary | ICD-10-CM | POA: Diagnosis not present

## 2016-07-10 DIAGNOSIS — B379 Candidiasis, unspecified: Secondary | ICD-10-CM | POA: Diagnosis not present

## 2016-07-10 DIAGNOSIS — I10 Essential (primary) hypertension: Secondary | ICD-10-CM | POA: Diagnosis not present

## 2016-07-10 DIAGNOSIS — Z23 Encounter for immunization: Secondary | ICD-10-CM | POA: Diagnosis not present

## 2016-07-10 DIAGNOSIS — E782 Mixed hyperlipidemia: Secondary | ICD-10-CM | POA: Diagnosis not present

## 2016-07-10 DIAGNOSIS — E119 Type 2 diabetes mellitus without complications: Secondary | ICD-10-CM | POA: Diagnosis not present

## 2016-11-02 DIAGNOSIS — L299 Pruritus, unspecified: Secondary | ICD-10-CM | POA: Diagnosis not present

## 2016-11-02 DIAGNOSIS — B3789 Other sites of candidiasis: Secondary | ICD-10-CM | POA: Diagnosis not present

## 2016-11-02 DIAGNOSIS — L409 Psoriasis, unspecified: Secondary | ICD-10-CM | POA: Diagnosis not present

## 2016-11-02 DIAGNOSIS — R42 Dizziness and giddiness: Secondary | ICD-10-CM | POA: Diagnosis not present

## 2016-11-04 DIAGNOSIS — B3789 Other sites of candidiasis: Secondary | ICD-10-CM | POA: Diagnosis not present

## 2016-11-04 DIAGNOSIS — L409 Psoriasis, unspecified: Secondary | ICD-10-CM | POA: Diagnosis not present

## 2016-11-04 DIAGNOSIS — L299 Pruritus, unspecified: Secondary | ICD-10-CM | POA: Diagnosis not present

## 2016-11-04 DIAGNOSIS — R42 Dizziness and giddiness: Secondary | ICD-10-CM | POA: Diagnosis not present

## 2016-12-23 DIAGNOSIS — H33002 Unspecified retinal detachment with retinal break, left eye: Secondary | ICD-10-CM | POA: Diagnosis not present

## 2016-12-24 DIAGNOSIS — H2512 Age-related nuclear cataract, left eye: Secondary | ICD-10-CM | POA: Diagnosis not present

## 2016-12-24 DIAGNOSIS — H4312 Vitreous hemorrhage, left eye: Secondary | ICD-10-CM | POA: Diagnosis not present

## 2016-12-24 DIAGNOSIS — H2511 Age-related nuclear cataract, right eye: Secondary | ICD-10-CM | POA: Diagnosis not present

## 2016-12-24 DIAGNOSIS — H33302 Unspecified retinal break, left eye: Secondary | ICD-10-CM | POA: Diagnosis not present

## 2016-12-28 DIAGNOSIS — E113592 Type 2 diabetes mellitus with proliferative diabetic retinopathy without macular edema, left eye: Secondary | ICD-10-CM | POA: Diagnosis not present

## 2016-12-28 DIAGNOSIS — H4312 Vitreous hemorrhage, left eye: Secondary | ICD-10-CM | POA: Diagnosis not present

## 2016-12-28 DIAGNOSIS — E113532 Type 2 diabetes mellitus with proliferative diabetic retinopathy with traction retinal detachment not involving the macula, left eye: Secondary | ICD-10-CM | POA: Diagnosis not present

## 2017-02-23 DIAGNOSIS — H2512 Age-related nuclear cataract, left eye: Secondary | ICD-10-CM | POA: Diagnosis not present

## 2017-02-23 DIAGNOSIS — H35372 Puckering of macula, left eye: Secondary | ICD-10-CM | POA: Diagnosis not present

## 2017-02-23 DIAGNOSIS — E113532 Type 2 diabetes mellitus with proliferative diabetic retinopathy with traction retinal detachment not involving the macula, left eye: Secondary | ICD-10-CM | POA: Diagnosis not present

## 2017-02-23 DIAGNOSIS — E113592 Type 2 diabetes mellitus with proliferative diabetic retinopathy without macular edema, left eye: Secondary | ICD-10-CM | POA: Diagnosis not present

## 2017-03-29 DIAGNOSIS — Z7689 Persons encountering health services in other specified circumstances: Secondary | ICD-10-CM | POA: Diagnosis not present

## 2017-03-29 DIAGNOSIS — E119 Type 2 diabetes mellitus without complications: Secondary | ICD-10-CM | POA: Diagnosis not present

## 2017-04-07 DIAGNOSIS — E782 Mixed hyperlipidemia: Secondary | ICD-10-CM | POA: Diagnosis not present

## 2017-04-07 DIAGNOSIS — E876 Hypokalemia: Secondary | ICD-10-CM | POA: Diagnosis not present

## 2017-04-07 DIAGNOSIS — Z6830 Body mass index (BMI) 30.0-30.9, adult: Secondary | ICD-10-CM | POA: Diagnosis not present

## 2017-04-07 DIAGNOSIS — R944 Abnormal results of kidney function studies: Secondary | ICD-10-CM | POA: Diagnosis not present

## 2017-04-07 DIAGNOSIS — R296 Repeated falls: Secondary | ICD-10-CM | POA: Diagnosis not present

## 2017-04-07 DIAGNOSIS — E119 Type 2 diabetes mellitus without complications: Secondary | ICD-10-CM | POA: Diagnosis not present

## 2017-04-16 DIAGNOSIS — E785 Hyperlipidemia, unspecified: Secondary | ICD-10-CM | POA: Diagnosis not present

## 2017-04-16 DIAGNOSIS — Z9181 History of falling: Secondary | ICD-10-CM | POA: Diagnosis not present

## 2017-04-16 DIAGNOSIS — Z794 Long term (current) use of insulin: Secondary | ICD-10-CM | POA: Diagnosis not present

## 2017-04-16 DIAGNOSIS — E119 Type 2 diabetes mellitus without complications: Secondary | ICD-10-CM | POA: Diagnosis not present

## 2017-04-16 DIAGNOSIS — E876 Hypokalemia: Secondary | ICD-10-CM | POA: Diagnosis not present

## 2017-04-16 DIAGNOSIS — R296 Repeated falls: Secondary | ICD-10-CM | POA: Diagnosis not present

## 2017-04-16 DIAGNOSIS — R2689 Other abnormalities of gait and mobility: Secondary | ICD-10-CM | POA: Diagnosis not present

## 2017-04-16 DIAGNOSIS — Z87891 Personal history of nicotine dependence: Secondary | ICD-10-CM | POA: Diagnosis not present

## 2017-04-16 DIAGNOSIS — I10 Essential (primary) hypertension: Secondary | ICD-10-CM | POA: Diagnosis not present

## 2017-04-19 DIAGNOSIS — E119 Type 2 diabetes mellitus without complications: Secondary | ICD-10-CM | POA: Diagnosis not present

## 2017-04-19 DIAGNOSIS — Z9181 History of falling: Secondary | ICD-10-CM | POA: Diagnosis not present

## 2017-04-19 DIAGNOSIS — R296 Repeated falls: Secondary | ICD-10-CM | POA: Diagnosis not present

## 2017-04-19 DIAGNOSIS — I10 Essential (primary) hypertension: Secondary | ICD-10-CM | POA: Diagnosis not present

## 2017-04-19 DIAGNOSIS — R2689 Other abnormalities of gait and mobility: Secondary | ICD-10-CM | POA: Diagnosis not present

## 2017-04-19 DIAGNOSIS — E876 Hypokalemia: Secondary | ICD-10-CM | POA: Diagnosis not present

## 2017-04-20 DIAGNOSIS — Z9181 History of falling: Secondary | ICD-10-CM | POA: Diagnosis not present

## 2017-04-20 DIAGNOSIS — I10 Essential (primary) hypertension: Secondary | ICD-10-CM | POA: Diagnosis not present

## 2017-04-20 DIAGNOSIS — R2689 Other abnormalities of gait and mobility: Secondary | ICD-10-CM | POA: Diagnosis not present

## 2017-04-20 DIAGNOSIS — E119 Type 2 diabetes mellitus without complications: Secondary | ICD-10-CM | POA: Diagnosis not present

## 2017-04-20 DIAGNOSIS — E876 Hypokalemia: Secondary | ICD-10-CM | POA: Diagnosis not present

## 2017-04-20 DIAGNOSIS — R296 Repeated falls: Secondary | ICD-10-CM | POA: Diagnosis not present

## 2017-04-21 DIAGNOSIS — E119 Type 2 diabetes mellitus without complications: Secondary | ICD-10-CM | POA: Diagnosis not present

## 2017-04-21 DIAGNOSIS — I10 Essential (primary) hypertension: Secondary | ICD-10-CM | POA: Diagnosis not present

## 2017-04-22 DIAGNOSIS — Z9181 History of falling: Secondary | ICD-10-CM | POA: Diagnosis not present

## 2017-04-22 DIAGNOSIS — E876 Hypokalemia: Secondary | ICD-10-CM | POA: Diagnosis not present

## 2017-04-22 DIAGNOSIS — R296 Repeated falls: Secondary | ICD-10-CM | POA: Diagnosis not present

## 2017-04-22 DIAGNOSIS — I10 Essential (primary) hypertension: Secondary | ICD-10-CM | POA: Diagnosis not present

## 2017-04-22 DIAGNOSIS — R2689 Other abnormalities of gait and mobility: Secondary | ICD-10-CM | POA: Diagnosis not present

## 2017-04-22 DIAGNOSIS — E119 Type 2 diabetes mellitus without complications: Secondary | ICD-10-CM | POA: Diagnosis not present

## 2017-04-23 DIAGNOSIS — R296 Repeated falls: Secondary | ICD-10-CM | POA: Diagnosis not present

## 2017-04-23 DIAGNOSIS — E119 Type 2 diabetes mellitus without complications: Secondary | ICD-10-CM | POA: Diagnosis not present

## 2017-04-23 DIAGNOSIS — R2689 Other abnormalities of gait and mobility: Secondary | ICD-10-CM | POA: Diagnosis not present

## 2017-04-23 DIAGNOSIS — Z9181 History of falling: Secondary | ICD-10-CM | POA: Diagnosis not present

## 2017-04-23 DIAGNOSIS — E876 Hypokalemia: Secondary | ICD-10-CM | POA: Diagnosis not present

## 2017-04-23 DIAGNOSIS — I10 Essential (primary) hypertension: Secondary | ICD-10-CM | POA: Diagnosis not present

## 2017-04-26 DIAGNOSIS — I10 Essential (primary) hypertension: Secondary | ICD-10-CM | POA: Diagnosis not present

## 2017-04-26 DIAGNOSIS — R296 Repeated falls: Secondary | ICD-10-CM | POA: Diagnosis not present

## 2017-04-26 DIAGNOSIS — R2689 Other abnormalities of gait and mobility: Secondary | ICD-10-CM | POA: Diagnosis not present

## 2017-04-26 DIAGNOSIS — E119 Type 2 diabetes mellitus without complications: Secondary | ICD-10-CM | POA: Diagnosis not present

## 2017-04-26 DIAGNOSIS — Z9181 History of falling: Secondary | ICD-10-CM | POA: Diagnosis not present

## 2017-04-26 DIAGNOSIS — E876 Hypokalemia: Secondary | ICD-10-CM | POA: Diagnosis not present

## 2017-04-28 DIAGNOSIS — E119 Type 2 diabetes mellitus without complications: Secondary | ICD-10-CM | POA: Diagnosis not present

## 2017-04-28 DIAGNOSIS — R296 Repeated falls: Secondary | ICD-10-CM | POA: Diagnosis not present

## 2017-04-28 DIAGNOSIS — Z9181 History of falling: Secondary | ICD-10-CM | POA: Diagnosis not present

## 2017-04-28 DIAGNOSIS — R2689 Other abnormalities of gait and mobility: Secondary | ICD-10-CM | POA: Diagnosis not present

## 2017-04-28 DIAGNOSIS — E876 Hypokalemia: Secondary | ICD-10-CM | POA: Diagnosis not present

## 2017-04-28 DIAGNOSIS — I10 Essential (primary) hypertension: Secondary | ICD-10-CM | POA: Diagnosis not present

## 2017-04-29 DIAGNOSIS — I10 Essential (primary) hypertension: Secondary | ICD-10-CM | POA: Diagnosis not present

## 2017-04-29 DIAGNOSIS — R296 Repeated falls: Secondary | ICD-10-CM | POA: Diagnosis not present

## 2017-04-29 DIAGNOSIS — Z9181 History of falling: Secondary | ICD-10-CM | POA: Diagnosis not present

## 2017-04-29 DIAGNOSIS — E119 Type 2 diabetes mellitus without complications: Secondary | ICD-10-CM | POA: Diagnosis not present

## 2017-04-29 DIAGNOSIS — E876 Hypokalemia: Secondary | ICD-10-CM | POA: Diagnosis not present

## 2017-04-29 DIAGNOSIS — R2689 Other abnormalities of gait and mobility: Secondary | ICD-10-CM | POA: Diagnosis not present

## 2017-04-30 ENCOUNTER — Emergency Department (HOSPITAL_COMMUNITY): Payer: Medicare Other

## 2017-04-30 ENCOUNTER — Inpatient Hospital Stay (HOSPITAL_COMMUNITY)
Admission: EM | Admit: 2017-04-30 | Discharge: 2017-05-08 | DRG: 637 | Disposition: A | Discharge status: Home Health | Payer: Medicare Other | Attending: Internal Medicine | Admitting: Internal Medicine

## 2017-04-30 ENCOUNTER — Encounter (HOSPITAL_COMMUNITY): Payer: Self-pay | Admitting: Emergency Medicine

## 2017-04-30 DIAGNOSIS — I130 Hypertensive heart and chronic kidney disease with heart failure and stage 1 through stage 4 chronic kidney disease, or unspecified chronic kidney disease: Secondary | ICD-10-CM | POA: Diagnosis present

## 2017-04-30 DIAGNOSIS — N289 Disorder of kidney and ureter, unspecified: Secondary | ICD-10-CM | POA: Diagnosis not present

## 2017-04-30 DIAGNOSIS — Z833 Family history of diabetes mellitus: Secondary | ICD-10-CM

## 2017-04-30 DIAGNOSIS — G8929 Other chronic pain: Secondary | ICD-10-CM | POA: Diagnosis present

## 2017-04-30 DIAGNOSIS — N170 Acute kidney failure with tubular necrosis: Secondary | ICD-10-CM | POA: Diagnosis present

## 2017-04-30 DIAGNOSIS — E1140 Type 2 diabetes mellitus with diabetic neuropathy, unspecified: Secondary | ICD-10-CM | POA: Diagnosis present

## 2017-04-30 DIAGNOSIS — I69351 Hemiplegia and hemiparesis following cerebral infarction affecting right dominant side: Secondary | ICD-10-CM

## 2017-04-30 DIAGNOSIS — E669 Obesity, unspecified: Secondary | ICD-10-CM | POA: Diagnosis present

## 2017-04-30 DIAGNOSIS — N179 Acute kidney failure, unspecified: Secondary | ICD-10-CM | POA: Diagnosis not present

## 2017-04-30 DIAGNOSIS — Z87891 Personal history of nicotine dependence: Secondary | ICD-10-CM

## 2017-04-30 DIAGNOSIS — E876 Hypokalemia: Secondary | ICD-10-CM | POA: Diagnosis present

## 2017-04-30 DIAGNOSIS — E162 Hypoglycemia, unspecified: Secondary | ICD-10-CM | POA: Diagnosis present

## 2017-04-30 DIAGNOSIS — N182 Chronic kidney disease, stage 2 (mild): Secondary | ICD-10-CM | POA: Diagnosis present

## 2017-04-30 DIAGNOSIS — E860 Dehydration: Secondary | ICD-10-CM | POA: Diagnosis present

## 2017-04-30 DIAGNOSIS — Z882 Allergy status to sulfonamides status: Secondary | ICD-10-CM

## 2017-04-30 DIAGNOSIS — Z91013 Allergy to seafood: Secondary | ICD-10-CM

## 2017-04-30 DIAGNOSIS — Z79899 Other long term (current) drug therapy: Secondary | ICD-10-CM

## 2017-04-30 DIAGNOSIS — R531 Weakness: Secondary | ICD-10-CM | POA: Diagnosis not present

## 2017-04-30 DIAGNOSIS — Z79891 Long term (current) use of opiate analgesic: Secondary | ICD-10-CM

## 2017-04-30 DIAGNOSIS — G629 Polyneuropathy, unspecified: Secondary | ICD-10-CM

## 2017-04-30 DIAGNOSIS — T383X5A Adverse effect of insulin and oral hypoglycemic [antidiabetic] drugs, initial encounter: Secondary | ICD-10-CM | POA: Diagnosis not present

## 2017-04-30 DIAGNOSIS — R0600 Dyspnea, unspecified: Secondary | ICD-10-CM

## 2017-04-30 DIAGNOSIS — I10 Essential (primary) hypertension: Secondary | ICD-10-CM | POA: Diagnosis not present

## 2017-04-30 DIAGNOSIS — E11649 Type 2 diabetes mellitus with hypoglycemia without coma: Secondary | ICD-10-CM | POA: Diagnosis not present

## 2017-04-30 DIAGNOSIS — E160 Drug-induced hypoglycemia without coma: Secondary | ICD-10-CM | POA: Diagnosis present

## 2017-04-30 DIAGNOSIS — I5033 Acute on chronic diastolic (congestive) heart failure: Secondary | ICD-10-CM | POA: Diagnosis present

## 2017-04-30 DIAGNOSIS — Z794 Long term (current) use of insulin: Secondary | ICD-10-CM

## 2017-04-30 DIAGNOSIS — M549 Dorsalgia, unspecified: Secondary | ICD-10-CM | POA: Diagnosis present

## 2017-04-30 DIAGNOSIS — E8770 Fluid overload, unspecified: Secondary | ICD-10-CM | POA: Diagnosis not present

## 2017-04-30 DIAGNOSIS — R809 Proteinuria, unspecified: Secondary | ICD-10-CM | POA: Diagnosis present

## 2017-04-30 DIAGNOSIS — Z6831 Body mass index (BMI) 31.0-31.9, adult: Secondary | ICD-10-CM

## 2017-04-30 DIAGNOSIS — Z8249 Family history of ischemic heart disease and other diseases of the circulatory system: Secondary | ICD-10-CM

## 2017-04-30 DIAGNOSIS — Z888 Allergy status to other drugs, medicaments and biological substances status: Secondary | ICD-10-CM

## 2017-04-30 DIAGNOSIS — Z880 Allergy status to penicillin: Secondary | ICD-10-CM

## 2017-04-30 HISTORY — DX: Cerebral infarction, unspecified: I63.9

## 2017-04-30 LAB — URINALYSIS, ROUTINE W REFLEX MICROSCOPIC
BILIRUBIN URINE: NEGATIVE
Glucose, UA: NEGATIVE mg/dL
HGB URINE DIPSTICK: NEGATIVE
Ketones, ur: NEGATIVE mg/dL
LEUKOCYTES UA: NEGATIVE
NITRITE: POSITIVE — AB
PH: 6 (ref 5.0–8.0)
Protein, ur: >=300 mg/dL — AB
SPECIFIC GRAVITY, URINE: 1.011 (ref 1.005–1.030)

## 2017-04-30 LAB — CBC WITH DIFFERENTIAL/PLATELET
Basophils Absolute: 0 10*3/uL (ref 0.0–0.1)
Basophils Relative: 0 %
EOS PCT: 1 %
Eosinophils Absolute: 0.1 10*3/uL (ref 0.0–0.7)
HCT: 45.5 % (ref 36.0–46.0)
HEMOGLOBIN: 15 g/dL (ref 12.0–15.0)
Lymphocytes Relative: 18 %
Lymphs Abs: 1.2 10*3/uL (ref 0.7–4.0)
MCH: 29 pg (ref 26.0–34.0)
MCHC: 33 g/dL (ref 30.0–36.0)
MCV: 87.8 fL (ref 78.0–100.0)
MONOS PCT: 8 %
Monocytes Absolute: 0.5 10*3/uL (ref 0.1–1.0)
NEUTROS PCT: 73 %
Neutro Abs: 4.8 10*3/uL (ref 1.7–7.7)
PLATELETS: 202 10*3/uL (ref 150–400)
RBC: 5.18 MIL/uL — ABNORMAL HIGH (ref 3.87–5.11)
RDW: 16.4 % — ABNORMAL HIGH (ref 11.5–15.5)
WBC: 6.7 10*3/uL (ref 4.0–10.5)

## 2017-04-30 LAB — CBG MONITORING, ED
GLUCOSE-CAPILLARY: 31 mg/dL — AB (ref 65–99)
GLUCOSE-CAPILLARY: 33 mg/dL — AB (ref 65–99)
Glucose-Capillary: 55 mg/dL — ABNORMAL LOW (ref 65–99)
Glucose-Capillary: 62 mg/dL — ABNORMAL LOW (ref 65–99)

## 2017-04-30 LAB — COMPREHENSIVE METABOLIC PANEL
ALT: 14 U/L (ref 14–54)
ANION GAP: 11 (ref 5–15)
AST: 30 U/L (ref 15–41)
Albumin: 3.8 g/dL (ref 3.5–5.0)
Alkaline Phosphatase: 49 U/L (ref 38–126)
BILIRUBIN TOTAL: 1.2 mg/dL (ref 0.3–1.2)
BUN: 30 mg/dL — ABNORMAL HIGH (ref 6–20)
CO2: 23 mmol/L (ref 22–32)
Calcium: 9 mg/dL (ref 8.9–10.3)
Chloride: 104 mmol/L (ref 101–111)
Creatinine, Ser: 1.57 mg/dL — ABNORMAL HIGH (ref 0.44–1.00)
GFR, EST AFRICAN AMERICAN: 39 mL/min — AB (ref >60)
GFR, EST NON AFRICAN AMERICAN: 34 mL/min — AB (ref >60)
Glucose, Bld: 41 mg/dL — CL (ref 65–99)
Potassium: 2.8 mmol/L — ABNORMAL LOW (ref 3.5–5.1)
SODIUM: 138 mmol/L (ref 135–145)
TOTAL PROTEIN: 8.1 g/dL (ref 6.5–8.1)

## 2017-04-30 LAB — GLUCOSE, CAPILLARY
GLUCOSE-CAPILLARY: 143 mg/dL — AB (ref 65–99)
GLUCOSE-CAPILLARY: 106 mg/dL — AB (ref 65–99)
Glucose-Capillary: 26 mg/dL — CL (ref 65–99)

## 2017-04-30 LAB — MAGNESIUM: MAGNESIUM: 2.3 mg/dL (ref 1.7–2.4)

## 2017-04-30 LAB — TROPONIN I: Troponin I: 0.04 ng/mL (ref <0.03)

## 2017-04-30 MED ORDER — POTASSIUM CHLORIDE 10 MEQ/100ML IV SOLN
10.0000 meq | INTRAVENOUS | Status: AC
  Administered 2017-04-30 – 2017-05-01 (×3): 10 meq via INTRAVENOUS
  Filled 2017-04-30 (×3): qty 100

## 2017-04-30 MED ORDER — POTASSIUM CHLORIDE CRYS ER 10 MEQ PO TBCR
20.0000 meq | EXTENDED_RELEASE_TABLET | Freq: Every day | ORAL | Status: DC
  Administered 2017-05-01 – 2017-05-08 (×8): 20 meq via ORAL
  Filled 2017-04-30 (×7): qty 2

## 2017-04-30 MED ORDER — DEXTROSE 50 % IV SOLN
INTRAVENOUS | Status: AC
  Filled 2017-04-30: qty 50

## 2017-04-30 MED ORDER — DEXTROSE-NACL 5-0.45 % IV SOLN
INTRAVENOUS | Status: DC
  Administered 2017-04-30: 19:00:00 via INTRAVENOUS

## 2017-04-30 MED ORDER — GABAPENTIN 100 MG PO CAPS
200.0000 mg | ORAL_CAPSULE | Freq: Three times a day (TID) | ORAL | Status: DC
  Administered 2017-04-30 – 2017-05-08 (×23): 200 mg via ORAL
  Filled 2017-04-30 (×24): qty 2

## 2017-04-30 MED ORDER — DEXTROSE 50 % IV SOLN
1.0000 | Freq: Once | INTRAVENOUS | Status: AC
  Administered 2017-04-30: 50 mL via INTRAVENOUS

## 2017-04-30 MED ORDER — HYDRALAZINE HCL 20 MG/ML IJ SOLN
10.0000 mg | INTRAMUSCULAR | Status: DC | PRN
  Administered 2017-04-30 – 2017-05-03 (×6): 10 mg via INTRAVENOUS
  Filled 2017-04-30 (×6): qty 1

## 2017-04-30 MED ORDER — ASPIRIN 325 MG PO TABS
325.0000 mg | ORAL_TABLET | Freq: Every day | ORAL | Status: DC
  Administered 2017-05-01 – 2017-05-08 (×8): 325 mg via ORAL
  Filled 2017-04-30 (×8): qty 1

## 2017-04-30 MED ORDER — AMLODIPINE BESYLATE 5 MG PO TABS: 5.0000 mg | ORAL_TABLET | Freq: Every day | ORAL | Status: DC

## 2017-04-30 MED ORDER — HEPARIN SODIUM (PORCINE) 5000 UNIT/ML IJ SOLN
5000.0000 [IU] | Freq: Three times a day (TID) | INTRAMUSCULAR | Status: DC
  Administered 2017-04-30 – 2017-05-08 (×23): 5000 [IU] via SUBCUTANEOUS
  Filled 2017-04-30 (×24): qty 1

## 2017-04-30 NOTE — ED Notes (Signed)
Pt to xray

## 2017-04-30 NOTE — H&P (Signed)
History and Physical    Madison Reed TKP:[id number] DOB: January 17, 1951 DOA: 04/30/2017  PCP: Celene Squibb, MD  Patient coming from: Home.    Chief Complaint:   Low BS yesterday and today.   HPI: Madison Reed is an 66 y.o. female with hx of DM on glucotrol and Lantus, HTN on Losartan Hct, prior CVA, chronic back pain on Dilaudid, presented to the ER with symptomatic low BS.  She has been feeling weak and diaphoresis.  She has been on no new BS medication, and has been eating normally.  No CP or SOB.  Evalatuion in the ER showed BS of 50 and 60, and she was given food and regular Coke.  She was also found to have a K of 2.8 and a Cr of 1.6.  She was given D5 at 75cc per hour, and hospitalist was asked to admit her for hypoglycemia, AKI, and hypokalemia.   ED Course:  See above.  Rewiew of Systems:  Constitutional: Negative for malaise, fever and chills. No significant weight loss or weight gain Eyes: Negative for eye pain, redness and discharge, diplopia, visual changes, or flashes of light. ENMT: Negative for ear pain, hoarseness, nasal congestion, sinus pressure and sore throat. No headaches; tinnitus, drooling, or problem swallowing. Cardiovascular: Negative for chest pain, palpitations, diaphoresis, dyspnea and peripheral edema. ; No orthopnea, PND Respiratory: Negative for cough, hemoptysis, wheezing and stridor. No pleuritic chestpain. Gastrointestinal: Negative for diarrhea, constipation,  melena, blood in stool, hematemesis, jaundice and rectal bleeding.    Genitourinary: Negative for frequency, dysuria, incontinence,flank pain and hematuria; Musculoskeletal: Negative for back pain and neck pain. Negative for swelling and trauma.;  Skin: . Negative for pruritus, rash, abrasions, bruising and skin lesion.; ulcerations Neuro: Negative for headache, lightheadedness and neck stiffness. Negative for weakness, altered level of consciousness , altered mental status, extremity weakness, burning  feet, involuntary movement, seizure and syncope.  Psych: negative for anxiety, depression, insomnia, tearfulness, panic attacks, hallucinations, paranoia, suicidal or homicidal ideation    Past Medical History:  Diagnosis Date  . Abdominal aneurysm ([place])   . Bone spur   . Chronic back pain   . CVA (cerebral infarction)   . Diabetes mellitus without complication ([place])   . Hypertension   . Kidney stones   . Neuropathy   . Psoriasis   . Stroke ([place])   . Wound, open, foot     Past Surgical History:  Procedure Laterality Date  . ABDOMINAL HYSTERECTOMY    . BLADDER SUSPENSION    . VESICOVAGINAL FISTULA CLOSURE W/ TAH       reports that she has quit smoking. She smoked 0.00 packs per day for 30.00 years. She has never used smokeless tobacco. She reports that she does not drink alcohol or use drugs.  Allergies  Allergen Reactions  . Penicillins Itching  . Codeine Itching  . Hydrocodone-Acetaminophen Itching  . Shellfish Allergy Itching  . Sulfa Antibiotics Itching and Rash    Family History  Problem Relation Age of Onset  . Aneurysm Mother   . Diabetes Mother   . Hypertension Mother      Prior to Admission medications   Medication Sig Start Date End Date Taking? Authorizing Provider  aspirin 325 MG tablet Take 1 tablet (325 mg total) by mouth daily. 08/04/13   Black, Lezlie Octave, NP  blood glucose meter kit and supplies KIT Dispense based on patient and insurance preference. Use up to four times daily as directed. (FOR ICD-9 250.00, 250.01).  03/24/15   Samuella Cota, MD  gabapentin (NEURONTIN) 100 MG capsule Take 2 capsules by mouth 3 (three) times daily. 07/31/13   [provider]  glipiZIDE (GLUCOTROL) 10 MG tablet Take 10 mg by mouth 2 (two) times daily before a meal.    [provider]  HYDROmorphone (DILAUDID) 4 MG tablet Take 2 mg by mouth every 6 (six) hours as needed for moderate pain or severe pain.     [provider]  insulin glargine  (LANTUS) 100 UNIT/ML injection Inject 0.2 mLs (20 Units total) into the skin at bedtime. 03/24/15   Samuella Cota, MD  losartan-hydrochlorothiazide (HYZAAR) 100-25 MG per tablet Take 1 tablet by mouth daily.    [provider]  potassium chloride 20 MEQ TBCR Take 20 mEq by mouth daily. 05/23/15   Ward, Delice Bison, DO  Silver-Carboxymethylcellulose (AQUACEL-AG EXTRA HYDROFIBER) 4"X5" PADS Cut to fit and fill wounds on both sides of toe with this material and change daily. Patient taking differently: Apply 1 application topically See admin instructions. Cut to fit and fill wounds on both sides of toe with this material and change daily. 03/24/15   Samuella Cota, MD    Physical Exam: Vitals:   04/30/17 1704 04/30/17 1730 04/30/17 1900  BP:  (!) 220/81 (!) 222/89  Pulse:  82 84  Resp:  (!) 22 19  Temp:  98.2 F (36.8 C)   TempSrc:  Oral   SpO2:  100% 100%  Weight: 89.4 kg (197 lb)    Height: 5' 6"  (1.676 m)        Constitutional: NAD, calm, comfortable Vitals:   04/30/17 1704 04/30/17 1730 04/30/17 1900  BP:  (!) 220/81 (!) 222/89  Pulse:  82 84  Resp:  (!) 22 19  Temp:  98.2 F (36.8 C)   TempSrc:  Oral   SpO2:  100% 100%  Weight: 89.4 kg (197 lb)    Height: 5' 6"  (1.676 m)     Eyes: PERRL, lids and conjunctivae normal ENMT: Mucous membranes are moist. Posterior pharynx clear of any exudate or lesions.Normal dentition.  Neck: normal, supple, no masses, no thyromegaly Respiratory: clear to auscultation bilaterally, no wheezing, no crackles. Normal respiratory effort. No accessory muscle use.  Cardiovascular: Regular rate and rhythm, no murmurs / rubs / gallops. No extremity edema. 2+ pedal pulses. No carotid bruits.  Abdomen: no tenderness, no masses palpated. No hepatosplenomegaly. Bowel sounds positive.  Musculoskeletal: no clubbing / cyanosis. No joint deformity upper and lower extremities. Good ROM, no contractures. Normal muscle tone.  Skin: no rashes,  lesions, ulcers. No induration Neurologic: CN 2-12 grossly intact. Sensation intact, DTR normal. Strength 5/5 in all 4.  Psychiatric: Normal judgment and insight. Alert and oriented x 3. Normal mood.   Labs on Admission: I have personally reviewed following labs and imaging studies CBC:  Recent Labs Lab 04/30/17 1740  WBC 6.7  NEUTROABS 4.8  HGB 15.0  HCT 45.5  MCV 87.8  PLT 409   Basic Metabolic Panel:  Recent Labs Lab 04/30/17 1740  NA 138  K 2.8*  CL 104  CO2 23  GLUCOSE 41*  BUN 30*  CREATININE 1.57*  CALCIUM 9.0   GFR: Estimated Creatinine Clearance: 41.3 mL/min (A) (by C-G formula based on SCr of 1.57 mg/dL (H)). Liver Function Tests:  Recent Labs Lab 04/30/17 1740  AST 30  ALT 14  ALKPHOS 49  BILITOT 1.2  PROT 8.1  ALBUMIN 3.8   Cardiac Enzymes:  Recent Labs Lab 04/30/17 1740  TROPONINI 0.04*   CBG:  Recent Labs Lab 04/30/17 1703 04/30/17 1826  GLUCAP 55* 62*   Urine analysis:    Component Value Date/Time   COLORURINE AMBER (A) 04/30/2017 1746   APPEARANCEUR CLEAR 04/30/2017 1746   LABSPEC 1.011 04/30/2017 1746   PHURINE 6.0 04/30/2017 1746   GLUCOSEU NEGATIVE 04/30/2017 1746   HGBUR NEGATIVE 04/30/2017 1746   BILIRUBINUR NEGATIVE 04/30/2017 1746   KETONESUR NEGATIVE 04/30/2017 1746   PROTEINUR >=300 (A) 04/30/2017 1746   UROBILINOGEN 0.2 08/03/2013 0936   NITRITE POSITIVE (A) 04/30/2017 1746   LEUKOCYTESUR NEGATIVE 04/30/2017 1746    Radiological Exams on Admission: Dg Chest 2 View  Result Date: 04/30/2017 CLINICAL DATA:  Weakness with hypoglycemia EXAM: CHEST  2 VIEW COMPARISON:  03/24/2015 FINDINGS: Cardiomegaly with aortic atherosclerosis. No thoracic aortic aneurysm. No acute pulmonary consolidation, effusion or pneumothorax. No acute nor suspicious osseous lesions. IMPRESSION: Stable cardiomegaly without acute pulmonary disease. Aortic atherosclerosis without aneurysm. Electronically Signed   By: Ashley Royalty M.D.   On:  04/30/2017 19:42    EKG: Independently reviewed.   Assessment/Plan Principal Problem:   Hypoglycemia due to insulin Active Problems:   Hypertension   Neuropathy   Hypokalemia   AKI (acute kidney injury) ([place])   PLAN:   Hypoglycemia:  Suspect from worsening of her renal fx, and on Lantus.  Her condition is aggravated with oral Glipizide as well.  Will check BS every 4 hours, hold Lantus and Glucotrol, and continue with D5 NS.  Will give her carb modified diet.  HTN:  WIll give IV Hydralazine PRN.  Give Norvasc 75m per day.  Hold ACE I and Diuretics.  AKI:  Likely from pre renal cause.  Worsen with ACE I.  Hold both diuretic and ACE.  Give IVF.  Follow Cr.   Hypokalemia:  Will give oral and IV Boluses.   DVT prophylaxis: subQ Heparin.  Code Status: FULL CODE.  Family Communication: none at bedside.  Disposition Plan: Home.  Consults called: None.  Admission status: OBS.    Jerre Diguglielmo MD FACP. [facility]  If 7PM-7AM, please contact night-coverage [email or web address] Password [email or web address]  04/30/2017, 8:09 PM

## 2017-04-30 NOTE — ED Notes (Signed)
Pt stated she has not had her BP meds today.

## 2017-04-30 NOTE — ED Notes (Signed)
CBG 62

## 2017-04-30 NOTE — ED Provider Notes (Signed)
[facility] DEPT Provider Note   CSN: [id number] Arrival date & time: 04/30/17  1658     History   Chief Complaint Chief Complaint  Patient presents with  . Hypoglycemia    HPI Madison Reed is a 66 y.o. female.  HPI Patient presents with generalized weakness and hypoglycemia. Has felt bad for last few days. Did not take her medicine today. Has eaten. Did not take her Lantus last night either. For last 2 days her sugars been in the 5s. States she does not know why. States she took Azo yesterday because she was having some dysuria. No dysuria today. States she's been weak and has had difficulty getting up and moving around. No chest pain. No fevers. No cough. She is wearing winter gloves and says it is because she has neuropathy in her hands get cold. Past Medical History:  Diagnosis Date  . 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 Bone spur   . Chronic back pain   . CVA (cerebral infarction)   . Diabetes mellitus without complication ([place])   . Hypertension   . Kidney stones   . Neuropathy   . Psoriasis   . Stroke ([place])   . Wound, open, foot     Patient Active Problem List   Diagnosis Date Noted  . Hypoglycemia due to insulin 04/30/2017  . AKI (acute kidney injury) ([place]) 04/30/2017  . Hypoglycemia 04/30/2017  . Septic arthritis of IP joint of toe ([place])   . Cellulitis of great toe   . DM type 2, uncontrolled, with lower extremity ulcer ([place]) 03/23/2015  . Diabetic ulcer of left foot ([place])   . Weakness of right arm/hand 09/18/2013  . Unstable balance 08/11/2013  . Foot and toe(s), blister, infected 08/11/2013  . Difficulty in walking(719.7) 08/11/2013  . Weakness of right leg 08/11/2013  . Acute ischemic stroke ([place]) 08/03/2013  . Hypokalemia 08/03/2013  . Diabetes mellitus without complication ([place])   . Hypertension   . Neuropathy   . Chronic back pain   . Psoriasis   . Wound, open, foot     Past Surgical History:  Procedure Laterality Date  . ABDOMINAL HYSTERECTOMY     . BLADDER SUSPENSION    . VESICOVAGINAL FISTULA CLOSURE W/ TAH      OB History    Gravida Para Term Preterm AB Living   2 2 2     2    SAB TAB Ectopic Multiple Live Births                   Home Medications    Prior to Admission medications   Medication Sig Start Date End Date Taking? Authorizing Provider  amLODipine (NORVASC) 10 MG tablet Take 10 mg by mouth daily.   Yes [provider]  fenofibrate 160 MG tablet Take 160 mg by mouth daily.   Yes [provider]  fexofenadine (ALLEGRA) 180 MG tablet Take 180 mg by mouth daily.   Yes [provider]  gabapentin (NEURONTIN) 300 MG capsule Take 300 mg by mouth 3 (three) times daily.   Yes [provider]  glipiZIDE (GLUCOTROL) 10 MG tablet Take 10 mg by mouth 2 (two) times daily before a meal.   Yes [provider]  insulin glargine (LANTUS) 100 UNIT/ML injection Inject 0.2 mLs (20 Units total) into the skin at bedtime. 03/24/15  Yes Samuella Cota, MD  losartan-hydrochlorothiazide (HYZAAR) 100-12.5 MG tablet Take 1 tablet by mouth daily.   Yes [provider]  sitaGLIPtin (JANUVIA) 100 MG tablet Take 100 mg by mouth daily.   Yes [provider]  blood glucose meter kit and supplies KIT Dispense based on patient and insurance preference. Use up to four times daily as directed. (FOR ICD-9 250.00, 250.01). 03/24/15   Samuella Cota, MD    Family History Family History  Problem Relation Age of Onset  . Aneurysm Mother   . Diabetes Mother   . Hypertension Mother     Social History Social History  Substance Use Topics  . Smoking status: Former Smoker    Packs/day: 0.00    Years: 30.00  . Smokeless tobacco: Never Used  . Alcohol use No     Allergies   Penicillins; Clonidine derivatives; Codeine; Hydrocodone-acetaminophen; Shellfish allergy; and Sulfa antibiotics   Review of Systems Review of Systems  Constitutional: Negative for appetite change and  fever.  HENT: Negative for congestion.   Respiratory: Negative for cough.   Cardiovascular: Negative for chest pain.  Gastrointestinal: Negative for abdominal pain.  Endocrine: Negative for polyuria.  Genitourinary: Positive for dysuria.  Musculoskeletal: Negative for back pain.  Skin: Negative for wound.  Neurological: Positive for weakness.  Hematological: Negative for adenopathy.     Physical Exam Updated Vital Signs BP (!) 188/66 (BP Location: Right Arm)   Pulse 89   Temp 97.9 F (36.6 C) (Oral)   Resp (!) 29   Ht 5' 6"  (1.676 m)   Wt 89.4 kg (197 lb)   SpO2 92%   BMI 31.80 kg/m   Physical Exam  Constitutional: She appears well-developed.  HENT:  Head: Atraumatic.  Eyes: EOM are normal.  Neck: Neck supple.  Cardiovascular: Normal rate.   Pulmonary/Chest: Effort normal.  Abdominal: Soft.  Musculoskeletal: She exhibits no edema.  Neurological: She is alert.  Skin: Skin is warm. Capillary refill takes less than 2 seconds.     ED Treatments / Results  Labs (all labs ordered are listed, but only abnormal results are displayed) Labs Reviewed  COMPREHENSIVE METABOLIC PANEL - Abnormal; Notable for the following:       Result Value   Potassium 2.8 (*)    Glucose, Bld 41 (*)    BUN 30 (*)    Creatinine, Ser 1.57 (*)    GFR calc non Af Amer 34 (*)    GFR calc Af Amer 39 (*)    All other components within normal limits  URINALYSIS, ROUTINE W REFLEX MICROSCOPIC - Abnormal; Notable for the following:    Color, Urine AMBER (*)    Protein, ur >=300 (*)    Nitrite POSITIVE (*)    Bacteria, UA RARE (*)    Squamous Epithelial / LPF 0-5 (*)    All other components within normal limits  CBC WITH DIFFERENTIAL/PLATELET - Abnormal; Notable for the following:    RBC 5.18 (*)    RDW 16.4 (*)    All other components within normal limits  TROPONIN I - Abnormal; Notable for the following:    Troponin I 0.04 (*)    All other components within normal limits  GLUCOSE,  CAPILLARY - Abnormal; Notable for the following:    Glucose-Capillary 26 (*)    All other components within normal limits  GLUCOSE, CAPILLARY - Abnormal; Notable for the following:    Glucose-Capillary 143 (*)    All other components within normal limits  GLUCOSE, CAPILLARY - Abnormal; Notable for the following:    Glucose-Capillary 106 (*)    All other components within normal limits  GLUCOSE, CAPILLARY - Abnormal; Notable for the following:    Glucose-Capillary 108 (*)    All other components within normal limits  CBG MONITORING, ED - Abnormal; Notable for the following:    Glucose-Capillary 55 (*)    All other components within normal limits  CBG MONITORING, ED - Abnormal; Notable for the following:    Glucose-Capillary 62 (*)    All other components within normal limits  CBG MONITORING, ED - Abnormal; Notable for the following:    Glucose-Capillary 31 (*)    All other components within normal limits  CBG MONITORING, ED - Abnormal; Notable for the following:    Glucose-Capillary 33 (*)    All other components within normal limits  MAGNESIUM  HIV ANTIBODY (ROUTINE TESTING)  TSH  BASIC METABOLIC PANEL  CBC    EKG  EKG Interpretation  Date/Time:  Friday April 30 2017 18:12:40 EDT Ventricular Rate:  87 PR Interval:    QRS Duration: 101 QT Interval:  363 QTC Calculation: 437 R Axis:   89 Text Interpretation:  Sinus rhythm Probable left atrial enlargement Borderline right axis deviation LVH with secondary repolarization abnormality Confirmed by Davonna Belling [phone]) on 04/30/2017 6:23:35 PM       Radiology Dg Chest 2 View  Result Date: 04/30/2017 CLINICAL DATA:  Weakness with hypoglycemia EXAM: CHEST  2 VIEW COMPARISON:  03/24/2015 FINDINGS: Cardiomegaly with aortic atherosclerosis. No thoracic aortic aneurysm. No acute pulmonary consolidation, effusion or pneumothorax. No acute nor suspicious osseous lesions. IMPRESSION: Stable cardiomegaly without acute pulmonary  disease. Aortic atherosclerosis without aneurysm. Electronically Signed   By: Ashley Royalty M.D.   On: 04/30/2017 19:42    Procedures Procedures (including critical care time)  Medications Ordered in ED Medications  dextrose 5 %-0.45 % sodium chloride infusion ( Intravenous New Bag/Given 04/30/17 1901)  dextrose 50 % solution (not administered)  potassium chloride (K-DUR,KLOR-CON) CR tablet 20 mEq (not administered)  aspirin tablet 325 mg (not administered)  gabapentin (NEURONTIN) capsule 200 mg (200 mg Oral Given 04/30/17 2343)  heparin injection 5,000 Units (5,000 Units Subcutaneous Given 04/30/17 2343)  potassium chloride 10 mEq in 100 mL IVPB (10 mEq Intravenous New Bag/Given 04/30/17 2343)  hydrALAZINE (APRESOLINE) injection 10 mg (10 mg Intravenous Given 04/30/17 2241)  amLODipine (NORVASC) tablet 5 mg (not administered)  dextrose 50 % solution 50 mL (50 mLs Intravenous Given 04/30/17 1900)  dextrose 50 % solution (  Duplicate 3/53/29 9242)  dextrose 50 % solution 50 mL (50 mLs Intravenous Given 04/30/17 2242)     Initial Impression / Assessment and Plan / ED Course  I have reviewed the triage vital signs and the nursing notes.  Pertinent labs & imaging results that were available during my care of the patient were reviewed by me and considered in my medical decision making (see chart for details).     Patient with hypoglycemia. May be due to worsening renal function. Sugars dipped down into the 40s and later 30s here. On D50 and D5 drip. Admit to internal medicine. Required D50 bolus after being on D5 drip. Admit to internal medicine.  CRITICAL CARE Performed by: Mackie Pai Total critical care time: 30 minutes Critical care time was exclusive of separately billable procedures and treating other patients. Critical care was necessary to treat or prevent imminent or life-threatening deterioration. Critical care was time spent personally by me on the following activities:  development of treatment plan with patient and/or surrogate as well as nursing, discussions with consultants, evaluation of patient's response  to treatment, examination of patient, obtaining history from patient or surrogate, ordering and performing treatments and interventions, ordering and review of laboratory studies, ordering and review of radiographic studies, pulse oximetry and re-evaluation of patient's condition.   Final Clinical Impressions(s) / ED Diagnoses   Final diagnoses:  Hypoglycemia  Renal insufficiency    New Prescriptions Current Discharge Medication List       Davonna Belling, MD 05/01/17 [phone]

## 2017-04-30 NOTE — ED Notes (Signed)
Called AC to

## 2017-04-30 NOTE — ED Triage Notes (Signed)
Pt reports low glucose for 2 days with generally feeling bad.  States she did not take meds today and has eaten several times.   Did not take lantus last night either.  Glucose running in 50s today and yesterday.

## 2017-04-30 NOTE — ED Notes (Signed)
CRITICAL VALUE ALERT  Critical Value:  Glucose 41 Troponin 0.04  Date & Time Notied: 04/30/2017 1911  Provider Notified: Dr. Alvino Chapel   Orders Received/Actions taken: Notified MD

## 2017-04-30 NOTE — ED Notes (Signed)
cbg 75

## 2017-05-01 DIAGNOSIS — E160 Drug-induced hypoglycemia without coma: Secondary | ICD-10-CM | POA: Diagnosis not present

## 2017-05-01 DIAGNOSIS — E876 Hypokalemia: Secondary | ICD-10-CM | POA: Diagnosis not present

## 2017-05-01 DIAGNOSIS — N179 Acute kidney failure, unspecified: Secondary | ICD-10-CM | POA: Diagnosis not present

## 2017-05-01 DIAGNOSIS — I10 Essential (primary) hypertension: Secondary | ICD-10-CM | POA: Diagnosis not present

## 2017-05-01 DIAGNOSIS — T383X5A Adverse effect of insulin and oral hypoglycemic [antidiabetic] drugs, initial encounter: Secondary | ICD-10-CM | POA: Diagnosis not present

## 2017-05-01 LAB — BASIC METABOLIC PANEL
ANION GAP: 9 (ref 5–15)
BUN: 26 mg/dL — ABNORMAL HIGH (ref 6–20)
CHLORIDE: 103 mmol/L (ref 101–111)
CO2: 27 mmol/L (ref 22–32)
Calcium: 8.7 mg/dL — ABNORMAL LOW (ref 8.9–10.3)
Creatinine, Ser: 1.57 mg/dL — ABNORMAL HIGH (ref 0.44–1.00)
GFR calc Af Amer: 39 mL/min — ABNORMAL LOW (ref >60)
GFR, EST NON AFRICAN AMERICAN: 34 mL/min — AB (ref >60)
Glucose, Bld: 75 mg/dL (ref 65–99)
POTASSIUM: 3.1 mmol/L — AB (ref 3.5–5.1)
SODIUM: 139 mmol/L (ref 135–145)

## 2017-05-01 LAB — GLUCOSE, CAPILLARY
GLUCOSE-CAPILLARY: 91 mg/dL (ref 65–99)
GLUCOSE-CAPILLARY: 111 mg/dL — AB (ref 65–99)
GLUCOSE-CAPILLARY: 73 mg/dL (ref 65–99)
GLUCOSE-CAPILLARY: 86 mg/dL (ref 65–99)
Glucose-Capillary: 102 mg/dL — ABNORMAL HIGH (ref 65–99)
Glucose-Capillary: 108 mg/dL — ABNORMAL HIGH (ref 65–99)
Glucose-Capillary: 164 mg/dL — ABNORMAL HIGH (ref 65–99)
Glucose-Capillary: 70 mg/dL (ref 65–99)
Glucose-Capillary: 74 mg/dL (ref 65–99)
Glucose-Capillary: 76 mg/dL (ref 65–99)

## 2017-05-01 LAB — CBC
HEMATOCRIT: 44.5 % (ref 36.0–46.0)
HEMOGLOBIN: 14.3 g/dL (ref 12.0–15.0)
MCH: 28.5 pg (ref 26.0–34.0)
MCHC: 32.1 g/dL (ref 30.0–36.0)
MCV: 88.6 fL (ref 78.0–100.0)
Platelets: 208 10*3/uL (ref 150–400)
RBC: 5.02 MIL/uL (ref 3.87–5.11)
RDW: 16.8 % — ABNORMAL HIGH (ref 11.5–15.5)
WBC: 8.5 10*3/uL (ref 4.0–10.5)

## 2017-05-01 LAB — MRSA PCR SCREENING: MRSA by PCR: NEGATIVE

## 2017-05-01 LAB — TSH: TSH: 3.612 u[IU]/mL (ref 0.350–4.500)

## 2017-05-01 MED ORDER — AMLODIPINE BESYLATE 5 MG PO TABS
10.0000 mg | ORAL_TABLET | Freq: Every day | ORAL | Status: DC
  Administered 2017-05-01 – 2017-05-08 (×8): 10 mg via ORAL
  Filled 2017-05-01 (×8): qty 2

## 2017-05-01 MED ORDER — DEXTROSE-NACL 5-0.9 % IV SOLN
INTRAVENOUS | Status: DC
  Administered 2017-05-01 – 2017-05-02 (×3): via INTRAVENOUS

## 2017-05-01 MED ORDER — NYSTATIN 100000 UNIT/GM EX POWD
Freq: Two times a day (BID) | CUTANEOUS | Status: DC
  Administered 2017-05-01 – 2017-05-08 (×15): via TOPICAL
  Filled 2017-05-01 (×7): qty 15

## 2017-05-01 MED ORDER — DEXTROSE 10 % IV SOLN
INTRAVENOUS | Status: DC
  Administered 2017-05-01: 02:00:00 via INTRAVENOUS

## 2017-05-01 MED ORDER — POTASSIUM CHLORIDE CRYS ER 20 MEQ PO TBCR
40.0000 meq | EXTENDED_RELEASE_TABLET | Freq: Once | ORAL | Status: AC
  Administered 2017-05-01: 40 meq via ORAL
  Filled 2017-05-01: qty 2

## 2017-05-01 MED ORDER — SALINE SPRAY 0.65 % NA SOLN
1.0000 | NASAL | Status: DC | PRN
  Administered 2017-05-01: 1 via NASAL
  Filled 2017-05-01: qty 44

## 2017-05-01 MED ORDER — HYDRALAZINE HCL 25 MG PO TABS
50.0000 mg | ORAL_TABLET | Freq: Three times a day (TID) | ORAL | Status: DC
  Administered 2017-05-01 – 2017-05-02 (×3): 50 mg via ORAL
  Filled 2017-05-01 (×3): qty 2

## 2017-05-01 NOTE — Progress Notes (Signed)
Report called to [facility] RN in ICU, pt transported to ICU 8 with belongings, no complaints at this time.

## 2017-05-01 NOTE — Progress Notes (Signed)
[facility] PROGRESS NOTE  Madison Reed PFX:[id number] DOB: December 24, 1950 DOA: 04/30/2017 PCP: Celene Squibb, MD  Brief summary   66 y.o. female with hx of DM on glucotrol and Lantus, HTN on Losartan Hct, prior CVA, chronic back pain on Dilaudid, presented to the ER with symptomatic low BS.  She has been feeling weak and diaphoresis.  She has been on no new BS medication, and has been eating normally.  No CP or SOB.  Evalatuion in the ER showed BS of 50 and 60, and she was given food and regular Coke.  She was also found to have a K of 2.8 and a Cr of 1.6.  She was given D5 at 75cc per hour, and hospitalist was asked to admit her for hypoglycemia, AKI, and hypokalemia.   Assessment/Plan:  Hypoglycemia likely due to glipizide+insulin in the setting of AKI. Cont iv dextrose drip, frequent glucose checks  DM. Hypoglycemia on admission. Will restart insulin as needed. Check ha1c  AKI, likely prerenal, dehydration. Hold losartan/ hctz. Cont iv fluids. Monitor I/o, daily weight.   Hypokalemia, improving with replacement. Hold hctz   HTN. Uncontrolled. Restart amlodipine, added hydralazine. Holding losartan/hctz due to AKI   prior CVA with residual right sided weakness. No new symptoms   Code Status: full Family Communication: d/w patient, RN (indicate person spoken with, relationship, and if by phone, the number) Disposition Plan: home pend improvement    Consultants:  none  Procedures:  none  Antibiotics:  noen (indicate start date, and stop date if known)  HPI/Subjective: Alert, no distress   Objective: Vitals:   05/01/17 0311 05/01/17 0500  BP: (!) 208/83 (!) 187/70  Pulse:  97  Resp:    Temp:  97.9 F (36.6 C)    Intake/Output Summary (Last 24 hours) at 05/01/17 0914 Last data filed at 05/01/17 0500  Gross per 24 hour  Intake              255 ml  Output                0 ml  Net              255 ml   Filed Weights   04/30/17 1704  Weight: 89.4 kg (197 lb)     Exam:   General:  No distress   Cardiovascular: s1,s2 rrr  Respiratory: CTA BL  Abdomen: soft, nt, nd   Musculoskeletal: no leg edema    Data Reviewed: Basic Metabolic Panel:  Recent Labs Lab 04/30/17 1740 05/01/17 0624  NA 138 139  K 2.8* 3.1*  CL 104 103  CO2 23 27  GLUCOSE 41* 75  BUN 30* 26*  CREATININE 1.57* 1.57*  CALCIUM 9.0 8.7*  MG 2.3  --    Liver Function Tests:  Recent Labs Lab 04/30/17 1740  AST 30  ALT 14  ALKPHOS 49  BILITOT 1.2  PROT 8.1  ALBUMIN 3.8   No results for input(s): LIPASE, AMYLASE in the last 168 hours. No results for input(s): AMMONIA in the last 168 hours. CBC:  Recent Labs Lab 04/30/17 1740 05/01/17 0624  WBC 6.7 8.5  NEUTROABS 4.8  --   HGB 15.0 14.3  HCT 45.5 44.5  MCV 87.8 88.6  PLT 202 208   Cardiac Enzymes:  Recent Labs Lab 04/30/17 1740  TROPONINI 0.04*   BNP (last 3 results) No results for input(s): BNP in the last 8760 hours.  ProBNP (last 3 results) No results for input(s):  PROBNP in the last 8760 hours.  CBG:  Recent Labs Lab 05/01/17 0158 05/01/17 0407 05/01/17 0512 05/01/17 0655 05/01/17 0754  GLUCAP 102* 74 111* 73 76    No results found for this or any previous visit (from the past 240 hour(s)).   Studies: Dg Chest 2 View  Result Date: 04/30/2017 CLINICAL DATA:  Weakness with hypoglycemia EXAM: CHEST  2 VIEW COMPARISON:  03/24/2015 FINDINGS: Cardiomegaly with aortic atherosclerosis. No thoracic aortic aneurysm. No acute pulmonary consolidation, effusion or pneumothorax. No acute nor suspicious osseous lesions. IMPRESSION: Stable cardiomegaly without acute pulmonary disease. Aortic atherosclerosis without aneurysm. Electronically Signed   By: Ashley Royalty M.D.   On: 04/30/2017 19:42    Scheduled Meds: . amLODipine  10 mg Oral Daily  . aspirin  325 mg Oral Daily  . gabapentin  200 mg Oral TID  . heparin  5,000 Units Subcutaneous Q8H  . potassium chloride  20 mEq Oral Daily    Continuous Infusions: . dextrose 75 mL/hr at 05/01/17 0136    Principal Problem:   Hypoglycemia due to insulin Active Problems:   Hypertension   Neuropathy   Hypokalemia   AKI (acute kidney injury) ([place])   Hypoglycemia    Time spent: >35 minutes     Kinnie Feil  [facility] Pager [phone]. If 7PM-7AM, please contact night-coverage at [email or web address], password [facility] 05/01/2017, 9:14 AM  LOS: 0 days

## 2017-05-01 NOTE — Progress Notes (Addendum)
Per report, pt CBGs have been unstable and acuity of patient is higher than floor can meet, with having to monitor CBG every hr.  Paged Dr Daleen Bo via She[facility], returned call.  Dr placed order for pt to transfer to [facility] for closer monitoring and control.  Will transfer once bed available.

## 2017-05-01 NOTE — Progress Notes (Signed)
Rec'd report from ED nurse who stated pt cbg was 75. Ok per MD to be medsurg pt. Pt arrived on floor and was disoriented complained of not having vision, I asked ED nurse and she stated pt was not like that when they left her floor. Checked CBG and it was 26. Notified Dr. Truman Hayward. He asked me if my glucometer worked bc the one downstairs was not working properly and gave him a false reading in the 30s. They ended up checking her BS with another device. I adv our glucometer was in working order and pt signs & symptoms correlated with low BS. He asked that I give amp of D50 and switch her fluids from D5 to D10. After pushing amp her BS went to 143. Notified Dr. Truman Hayward and he adv I monitor her BS q1hr. Informed Tim [facility]) and he adv pt should not be on this floor with q1hr BS orders. He adv I should monitor and notify MD of any changes. Her admission BP was also elevated- systolic in the 163A. I informed Dr. Truman Hayward once again and he adv to give Hydralazine 10mg .  BS was checked at 2318 after pt had had meal, soft drink and it was 106.  BS at 0024 was 108 BS at 0108 was 91 gave pt orange juice  BS at 0158 was 102.  I called Dr. Truman Hayward and asked if she still needed to be q1hr cbg checks and he said no to switch to q2 for 3 occurrences.  BS at 0407 was 74 gave orange juice with 3 packets of sugar  I then informed charge nurse of situation. Adv Dr. Truman Hayward has given me several orders but will not move pt to higher acuity care. She adv that at this point we should recheck her BS after 5 and go from there.  BS at 0512 was 111.  Last BS taken at 0657 and pt BS is back down to 73.

## 2017-05-02 ENCOUNTER — Observation Stay (HOSPITAL_COMMUNITY): Payer: Medicare Other

## 2017-05-02 DIAGNOSIS — E8770 Fluid overload, unspecified: Secondary | ICD-10-CM | POA: Diagnosis not present

## 2017-05-02 DIAGNOSIS — N170 Acute kidney failure with tubular necrosis: Secondary | ICD-10-CM | POA: Diagnosis present

## 2017-05-02 DIAGNOSIS — E162 Hypoglycemia, unspecified: Secondary | ICD-10-CM | POA: Diagnosis not present

## 2017-05-02 DIAGNOSIS — Z794 Long term (current) use of insulin: Secondary | ICD-10-CM | POA: Diagnosis not present

## 2017-05-02 DIAGNOSIS — Z79891 Long term (current) use of opiate analgesic: Secondary | ICD-10-CM | POA: Diagnosis not present

## 2017-05-02 DIAGNOSIS — Z79899 Other long term (current) drug therapy: Secondary | ICD-10-CM | POA: Diagnosis not present

## 2017-05-02 DIAGNOSIS — E1129 Type 2 diabetes mellitus with other diabetic kidney complication: Secondary | ICD-10-CM | POA: Diagnosis not present

## 2017-05-02 DIAGNOSIS — N179 Acute kidney failure, unspecified: Secondary | ICD-10-CM | POA: Diagnosis not present

## 2017-05-02 DIAGNOSIS — E11649 Type 2 diabetes mellitus with hypoglycemia without coma: Secondary | ICD-10-CM | POA: Diagnosis present

## 2017-05-02 DIAGNOSIS — Z6831 Body mass index (BMI) 31.0-31.9, adult: Secondary | ICD-10-CM | POA: Diagnosis not present

## 2017-05-02 DIAGNOSIS — N182 Chronic kidney disease, stage 2 (mild): Secondary | ICD-10-CM | POA: Diagnosis present

## 2017-05-02 DIAGNOSIS — R809 Proteinuria, unspecified: Secondary | ICD-10-CM | POA: Diagnosis not present

## 2017-05-02 DIAGNOSIS — E160 Drug-induced hypoglycemia without coma: Secondary | ICD-10-CM | POA: Diagnosis not present

## 2017-05-02 DIAGNOSIS — N289 Disorder of kidney and ureter, unspecified: Secondary | ICD-10-CM | POA: Diagnosis not present

## 2017-05-02 DIAGNOSIS — G8929 Other chronic pain: Secondary | ICD-10-CM | POA: Diagnosis present

## 2017-05-02 DIAGNOSIS — I130 Hypertensive heart and chronic kidney disease with heart failure and stage 1 through stage 4 chronic kidney disease, or unspecified chronic kidney disease: Secondary | ICD-10-CM | POA: Diagnosis present

## 2017-05-02 DIAGNOSIS — Z87891 Personal history of nicotine dependence: Secondary | ICD-10-CM | POA: Diagnosis not present

## 2017-05-02 DIAGNOSIS — M549 Dorsalgia, unspecified: Secondary | ICD-10-CM | POA: Diagnosis present

## 2017-05-02 DIAGNOSIS — E1140 Type 2 diabetes mellitus with diabetic neuropathy, unspecified: Secondary | ICD-10-CM | POA: Diagnosis present

## 2017-05-02 DIAGNOSIS — E669 Obesity, unspecified: Secondary | ICD-10-CM | POA: Diagnosis present

## 2017-05-02 DIAGNOSIS — E860 Dehydration: Secondary | ICD-10-CM | POA: Diagnosis present

## 2017-05-02 DIAGNOSIS — I5033 Acute on chronic diastolic (congestive) heart failure: Secondary | ICD-10-CM | POA: Diagnosis present

## 2017-05-02 DIAGNOSIS — I69351 Hemiplegia and hemiparesis following cerebral infarction affecting right dominant side: Secondary | ICD-10-CM | POA: Diagnosis not present

## 2017-05-02 DIAGNOSIS — E876 Hypokalemia: Secondary | ICD-10-CM | POA: Diagnosis not present

## 2017-05-02 DIAGNOSIS — Z833 Family history of diabetes mellitus: Secondary | ICD-10-CM | POA: Diagnosis not present

## 2017-05-02 DIAGNOSIS — Z8249 Family history of ischemic heart disease and other diseases of the circulatory system: Secondary | ICD-10-CM | POA: Diagnosis not present

## 2017-05-02 DIAGNOSIS — I340 Nonrheumatic mitral (valve) insufficiency: Secondary | ICD-10-CM | POA: Diagnosis not present

## 2017-05-02 DIAGNOSIS — R0602 Shortness of breath: Secondary | ICD-10-CM | POA: Diagnosis not present

## 2017-05-02 DIAGNOSIS — I10 Essential (primary) hypertension: Secondary | ICD-10-CM | POA: Diagnosis not present

## 2017-05-02 DIAGNOSIS — T383X5A Adverse effect of insulin and oral hypoglycemic [antidiabetic] drugs, initial encounter: Secondary | ICD-10-CM | POA: Diagnosis not present

## 2017-05-02 LAB — GLUCOSE, CAPILLARY
GLUCOSE-CAPILLARY: 178 mg/dL — AB (ref 65–99)
GLUCOSE-CAPILLARY: 207 mg/dL — AB (ref 65–99)
Glucose-Capillary: 218 mg/dL — ABNORMAL HIGH (ref 65–99)
Glucose-Capillary: 214 mg/dL — ABNORMAL HIGH (ref 65–99)

## 2017-05-02 LAB — BASIC METABOLIC PANEL
ANION GAP: 6 (ref 5–15)
BUN: 29 mg/dL — AB (ref 6–20)
CHLORIDE: 107 mmol/L (ref 101–111)
CO2: 24 mmol/L (ref 22–32)
Calcium: 8.3 mg/dL — ABNORMAL LOW (ref 8.9–10.3)
Creatinine, Ser: 1.68 mg/dL — ABNORMAL HIGH (ref 0.44–1.00)
GFR calc Af Amer: 36 mL/min — ABNORMAL LOW (ref >60)
GFR calc non Af Amer: 31 mL/min — ABNORMAL LOW (ref >60)
GLUCOSE: 196 mg/dL — AB (ref 65–99)
POTASSIUM: 4 mmol/L (ref 3.5–5.1)
Sodium: 137 mmol/L (ref 135–145)

## 2017-05-02 MED ORDER — INSULIN ASPART 100 UNIT/ML ~~LOC~~ SOLN
0.0000 [IU] | Freq: Three times a day (TID) | SUBCUTANEOUS | Status: DC
  Administered 2017-05-02 – 2017-05-03 (×3): 3 [IU] via SUBCUTANEOUS
  Administered 2017-05-03 – 2017-05-07 (×12): 2 [IU] via SUBCUTANEOUS
  Administered 2017-05-07 (×2): 3 [IU] via SUBCUTANEOUS
  Administered 2017-05-08: 2 [IU] via SUBCUTANEOUS
  Administered 2017-05-08: 3 [IU] via SUBCUTANEOUS

## 2017-05-02 MED ORDER — SODIUM CHLORIDE 0.9 % IV SOLN
INTRAVENOUS | Status: AC
  Administered 2017-05-02 – 2017-05-03 (×2): via INTRAVENOUS

## 2017-05-02 MED ORDER — CARVEDILOL 3.125 MG PO TABS
6.2500 mg | ORAL_TABLET | Freq: Two times a day (BID) | ORAL | Status: DC
  Administered 2017-05-02 – 2017-05-08 (×13): 6.25 mg via ORAL
  Filled 2017-05-02 (×13): qty 2

## 2017-05-02 MED ORDER — LIVING WELL WITH DIABETES BOOK
Freq: Once | Status: DC
  Filled 2017-05-02: qty 1

## 2017-05-02 MED ORDER — HYDRALAZINE HCL 25 MG PO TABS
75.0000 mg | ORAL_TABLET | Freq: Three times a day (TID) | ORAL | Status: DC
  Administered 2017-05-02 – 2017-05-04 (×6): 75 mg via ORAL
  Filled 2017-05-02 (×6): qty 3

## 2017-05-02 MED ORDER — HYDRALAZINE HCL 25 MG PO TABS
25.0000 mg | ORAL_TABLET | Freq: Once | ORAL | Status: AC
  Administered 2017-05-02: 25 mg via ORAL
  Filled 2017-05-02: qty 1

## 2017-05-02 NOTE — Plan of Care (Signed)
Problem: Safety: Goal: Ability to remain free from injury will improve Outcome: Progressing Bed in low position, side rails up, call bell and personal items within reach,

## 2017-05-02 NOTE — Progress Notes (Signed)
[facility] PROGRESS NOTE  Madison Reed [id number]:[id number] DOB: 12-Mar-1951 DOA: 04/30/2017 PCP: Celene Squibb, MD  Brief summary   66 y.o. female with hx of DM on glucotrol and Lantus, HTN on Losartan Hct, prior CVA, chronic back pain on Dilaudid, presented to the ER with symptomatic low BS.  She has been feeling weak and diaphoresis.  She has been on no new BS medication, and has been eating normally.  No CP or SOB.  Evalatuion in the ER showed BS of 50 and 60, and she was given food and regular Coke.  She was also found to have a K of 2.8 and a Cr of 1.6.  She was given D5 at 75cc per hour, and hospitalist was asked to admit her for hypoglycemia, AKI, and hypokalemia.   Assessment/Plan:  Hypoglycemia likely due to glipizide+insulin in the setting of AKI. Improved with continuous  iv dextrose drip, now glucose is >164 today. Will hold dextrose drip, monitor only with ISS. Dextrose iv if needed  DM. Hypoglycemia on admission. Pend ha1c. As above. Monitor on ISS> will restart lantus 24-48 hrs  AKI, likely prerenal, dehydration. Holding losartan/ hctz.  -creatinine is not to baseline level yet, despite gentle iv hydration. Will check renal US.  Monitor I/o, daily weight.   Hypokalemia, improved with replacement. Hold hctz   HTN. Uncontrolled. Restarted amlodipine, added hydralazine/incrased, and added coreg today. Holding losartan/hctz due to AKI  prior CVA with residual right sided weakness. No new symptoms. Obtain pt    Patient with hypoglycemia requiring iv dextrose, acute renal insufficiency, requiring iv fluids, uncontrolled hypertension>48 hrs. May need inpatient admission   Code Status: full Family Communication: d/w patient, RN. Recently updated her family (indicate person spoken with, relationship, and if by phone, the number) Disposition Plan: home pend improvement    Consultants:  none  Procedures:  none  Antibiotics:  noen (indicate start date, and stop date if  known)  HPI/Subjective: Alert, no distress   Objective: Vitals:   05/02/17 0700 05/02/17 0744  BP: (!) 187/80   Pulse: 95 95  Resp: (!) 23 (!) 22  Temp:  97.6 F (36.4 C)    Intake/Output Summary (Last 24 hours) at 05/02/17 0758 Last data filed at 05/02/17 0300  Gross per 24 hour  Intake          2093.75 ml  Output              450 ml  Net          1643.75 ml   Filed Weights   04/30/17 1704 05/01/17 0956 05/02/17 0500  Weight: 89.4 kg (197 lb) 90.5 kg (199 lb 8.3 oz) 94.3 kg (207 lb 14.3 oz)    Exam:   General:  No distress   Cardiovascular: s1,s2 rrr  Respiratory: CTA BL  Abdomen: soft, nt, nd   Musculoskeletal: no leg edema    Data Reviewed: Basic Metabolic Panel:  Recent Labs Lab 04/30/17 1740 05/01/17 0624 05/02/17 0550  NA 138 139 137  K 2.8* 3.1* 4.0  CL 104 103 107  CO2 23 27 24   GLUCOSE 41* 75 196*  BUN 30* 26* 29*  CREATININE 1.57* 1.57* 1.68*  CALCIUM 9.0 8.7* 8.3*  MG 2.3  --   --    Liver Function Tests:  Recent Labs Lab 04/30/17 1740  AST 30  ALT 14  ALKPHOS 49  BILITOT 1.2  PROT 8.1  ALBUMIN 3.8   No results for input(s): LIPASE, AMYLASE in  the last 168 hours. No results for input(s): AMMONIA in the last 168 hours. CBC:  Recent Labs Lab 04/30/17 1740 05/01/17 0624  WBC 6.7 8.5  NEUTROABS 4.8  --   HGB 15.0 14.3  HCT 45.5 44.5  MCV 87.8 88.6  PLT 202 208   Cardiac Enzymes:  Recent Labs Lab 04/30/17 1740  TROPONINI 0.04*   BNP (last 3 results) No results for input(s): BNP in the last 8760 hours.  ProBNP (last 3 results) No results for input(s): PROBNP in the last 8760 hours.  CBG:  Recent Labs Lab 05/01/17 0754 05/01/17 1054 05/01/17 1153 05/01/17 1754 05/02/17 0034  GLUCAP 76 70 86 164* 178*    Recent Results (from the past 240 hour(s))  MRSA PCR Screening     Status: None   Collection Time: 05/01/17 10:00 AM  Result Value Ref Range Status   MRSA by PCR NEGATIVE NEGATIVE Final    Comment:         The GeneXpert MRSA Assay (FDA approved for NASAL specimens only), is one component of a comprehensive MRSA colonization surveillance program. It is not intended to diagnose MRSA infection nor to guide or monitor treatment for MRSA infections.      Studies: Dg Chest 2 View  Result Date: 04/30/2017 CLINICAL DATA:  Weakness with hypoglycemia EXAM: CHEST  2 VIEW COMPARISON:  03/24/2015 FINDINGS: Cardiomegaly with aortic atherosclerosis. No thoracic aortic aneurysm. No acute pulmonary consolidation, effusion or pneumothorax. No acute nor suspicious osseous lesions. IMPRESSION: Stable cardiomegaly without acute pulmonary disease. Aortic atherosclerosis without aneurysm. Electronically Signed   By: Ashley Royalty M.D.   On: 04/30/2017 19:42    Scheduled Meds: . amLODipine  10 mg Oral Daily  . aspirin  325 mg Oral Daily  . gabapentin  200 mg Oral TID  . heparin  5,000 Units Subcutaneous Q8H  . hydrALAZINE  50 mg Oral Q8H  . living well with diabetes book   Does not apply Once  . nystatin   Topical BID  . potassium chloride  20 mEq Oral Daily   Continuous Infusions: . dextrose 5 % and 0.9% NaCl 125 mL/hr at 05/02/17 0115    Principal Problem:   Hypoglycemia due to insulin Active Problems:   Hypertension   Neuropathy   Hypokalemia   AKI (acute kidney injury) ([place])   Hypoglycemia    Time spent: >35 minutes     Kinnie Feil  [facility] Pager [phone]. If 7PM-7AM, please contact night-coverage at [email or web address], password [facility] 05/02/2017, 7:58 AM  LOS: 0 days

## 2017-05-03 LAB — GLUCOSE, CAPILLARY
GLUCOSE-CAPILLARY: 182 mg/dL — AB (ref 65–99)
GLUCOSE-CAPILLARY: 177 mg/dL — AB (ref 65–99)
GLUCOSE-CAPILLARY: 210 mg/dL — AB (ref 65–99)
Glucose-Capillary: 31 mg/dL — CL (ref 65–99)
Glucose-Capillary: 88 mg/dL (ref 65–99)
Glucose-Capillary: 193 mg/dL — ABNORMAL HIGH (ref 65–99)
Glucose-Capillary: 227 mg/dL — ABNORMAL HIGH (ref 65–99)

## 2017-05-03 LAB — BASIC METABOLIC PANEL
ANION GAP: 8 (ref 5–15)
BUN: 34 mg/dL — ABNORMAL HIGH (ref 6–20)
CALCIUM: 8.6 mg/dL — AB (ref 8.9–10.3)
CO2: 22 mmol/L (ref 22–32)
Chloride: 104 mmol/L (ref 101–111)
Creatinine, Ser: 1.74 mg/dL — ABNORMAL HIGH (ref 0.44–1.00)
GFR, EST AFRICAN AMERICAN: 35 mL/min — AB (ref >60)
GFR, EST NON AFRICAN AMERICAN: 30 mL/min — AB (ref >60)
Glucose, Bld: 178 mg/dL — ABNORMAL HIGH (ref 65–99)
Potassium: 3.9 mmol/L (ref 3.5–5.1)
SODIUM: 134 mmol/L — AB (ref 135–145)

## 2017-05-03 MED ORDER — INSULIN GLARGINE 100 UNIT/ML ~~LOC~~ SOLN
8.0000 [IU] | Freq: Every day | SUBCUTANEOUS | Status: DC
  Administered 2017-05-03 – 2017-05-08 (×6): 8 [IU] via SUBCUTANEOUS
  Filled 2017-05-03 (×8): qty 0.08

## 2017-05-03 NOTE — Progress Notes (Addendum)
Inpatient Diabetes Program Recommendations  AACE/ADA: New Consensus Statement on Inpatient Glycemic Control (2015)  Target Ranges:  Prepandial:   less than 140 mg/dL      Peak postprandial:   less than 180 mg/dL (1-2 hours)      Critically ill patients:  140 - 180 mg/dL  Results for Madison Reed, Madison Reed (MRN [id number]) as of 05/03/2017 07:30  Ref. Range 05/01/2017 07:54 05/01/2017 10:54 05/01/2017 11:53 05/01/2017 17:54 05/02/2017 00:34 05/02/2017 11:54 05/02/2017 16:07 05/02/2017 21:16 05/03/2017 07:36  Glucose-Capillary Latest Ref Range: 65 - 99 mg/dL 76 70 86 164 (H) 178 (H) 218 (H) 207 (H) 214 (H) 177 (H)   Results for Madison Reed, Madison Reed (MRN [id number]) as of 05/03/2017 07:30  Ref. Range 04/30/2017 17:03 04/30/2017 18:26 04/30/2017 20:56 04/30/2017 20:58 04/30/2017 22:11 04/30/2017 22:27 04/30/2017 23:18  Glucose-Capillary Latest Ref Range: 65 - 99 mg/dL 55 (L) 62 (L) 31 (LL) 33 (LL) 26 (LL) 143 (H) 106 (H)   Review of Glycemic Control  Diabetes history: DM2 Outpatient Diabetes medications: Lantus 20 units QHS, Glipizide 10 mg BID, Januvia 100 mg daily Current orders for Inpatient glycemic control: Novolog 0-9 units TID with meals  Inpatient Diabetes Program Recommendations:  Outpatient Referral: Recommend discharging patient on Lantus 5 units Q24H and follow up with PCP in the next 2-3 days.  NOTE: Spoke with patient (over phone; Diabetes Coordinator not on [facility] today) about diabetes and home regimen for diabetes control. Patient reports that she is followed by Dr. Nevada Crane for diabetes management and currently she takes Lantus 20 units QHS, Glipizide 10 mg BID, Januvia 100 mg daily as an outpatient for diabetes control. Patient reports that she is taking all DM medications a prescribed and that no recent changes have been made with DM medications.  Patient states that she checks her glucose 1 times per day and that it is usually in the 100's-200's mg/dl.  Patient reports that she did NOT take any DM medications  24 hours prior to coming to the hospital.  Patient reports that her glucose was low 2 days prior to being admitted but denies any other issues with hypoglycemia prior to that. Patient denies any recent changes with diet or exercise.  Discussed glucose and A1C goals. Discussed importance of checking CBGs and maintaining good CBG control to prevent long-term and short-term complications. Discussed glucose trends over the past 24 hours and current insulin regimen with Novolog correction only. Explained to patient that her outpatient DM medications may be changed by the doctor discharging her from the hospital.  Asked patient to check her glucose at least 2-3 times per day and to notify Dr. Nevada Crane if her glucose is consistently over 200 mg/dl or she has any hypoglycemia at all. Patient states that her fingers are sore and she can not check her glucose that often. Encouraged patient to ask Dr. Nevada Crane about getting a prescription for the FreeStyle Libre (flash glucose monitor which is a sensor worn on the arm that is changed every 10 days and uses a reader device to give a current glucose).  Patient verbalized understanding of information discussed and she states that she has no further questions at this time related to diabetes. Placed consult for [facility] CM to follow up with patient as an outpatient.  Thanks, Barnie Alderman, RN, MSN, CDE Diabetes Coordinator Inpatient Diabetes Program [phone] (Team Pager)

## 2017-05-03 NOTE — Consult Note (Addendum)
   [facility] Inpatient Consult   1/61/0960  MAJEL GIEL [birth year] [id number]    [facility] Care Management referral received.  Telephone call into patient's room. Attempted to explain [facility] Care Management to the best of ability. However, Madison Reed had difficulty hearing. She did give verbal consent but Probation officer not certain she understood program fully or if she could hear well enough.  Call made to inpatient RNCM, Janett Billow to make aware of above. Requested that Michel Bickers know if Mrs. Derk is certainly interested in [place] Management follow up.  Will await response from inpatient RNCM prior to requesting [place] follow up.   Marthenia Rolling, MSN-Ed, RN,BSN [facility] Liaison [phone]

## 2017-05-03 NOTE — Consult Note (Signed)
   [facility] Inpatient Consult   9/44/7395  ROYA GIESELMAN [phone]   [facility] Care Management follow up.   Inpatient RNCM called writer back to make aware that Mrs. Slawinski declined [place] Management services. As suspected, Mrs. Morren could not Surveyor, mining during earlier telephone call.  Appreciative of inpatient RNCM, Janett Billow, follow up with letting writer know that Mrs. Peltz declined [place] Management services.    Marthenia Rolling, MSN-Ed, RN,BSN [facility] Liaison [phone]

## 2017-05-03 NOTE — Care Management Note (Signed)
Case Management Note  Patient Details  Name: Madison Reed MRN: [id number] Date of Birth: 05/15/1951  Subjective/Objective:                  Pt admitted with AKI. She is from home with her husband. She is active with [facility]. She has PCP, transportation to appointments and insurance with drug coverage. She plans to return home with resumption of HH services through [facility]. Family at bedside for DC plan discussion.   Action/Plan: PT eval pending, DC planned for 7/24. Pt was unable to hear [facility] rep over phone. CM explain [facility] services and pt feel they are not necessary at this time. [facility] rep, Orrin Brigham, made aware.   Expected Discharge Date:       05/04/17           Expected Discharge Plan:  [place]  In-House Referral:  NA  Discharge planning Services  CM Consult  Post Acute Care Choice:  Home Health, Resumption of Svcs/PTA Provider Choice offered to:  Patient  HH Arranged:    [place] Agency:  [place]  Status of Service:  In process, will continue to follow  Sherald Barge, RN 05/03/2017, 4:08 PM

## 2017-05-03 NOTE — Progress Notes (Signed)
[facility] PROGRESS NOTE  Madison Reed NTI:[id number] DOB: [birth year] DOA: 04/30/2017 PCP: Celene Squibb, MD  Brief summary   66 y.o. female with hx of DM on glucotrol and Lantus, HTN on Losartan Hct, prior CVA, chronic back pain on Dilaudid, presented to the ER with symptomatic low BS.  She has been feeling weak and diaphoresis.  She has been on no new BS medication, and has been eating normally.  No CP or SOB.  Evalatuion in the ER showed BS of 50 and 60, and she was given food and regular Coke.  She was also found to have a K of 2.8 and a Cr of 1.6.  She was given D5 at 75cc per hour, and hospitalist was asked to admit her for hypoglycemia, AKI, and hypokalemia.   Assessment/Plan: Hypoglycemia likely due to glipizide+insulin in the setting of AKI. Improved with continuous  iv dextrose drip, now off dextrose. Continue sliding scale insulin, hemoglobin A1c pending, resume low-dose Lantus  AKI, likely prerenal, , could also have underlying chronic kidney disease stage I to 2,   Holding losartan/ hctz. Renal ultrasound unremarkable -creatinine is not to baseline level yet, despite gentle iv hydration. Will request nephrology to comment, try to obtain outpatient labs from PCP   Hypokalemia, improved with replacement. Hold hctz   HTN. Uncontrolled. Restarted amlodipine, added hydralazine/incrased, and added coreg today. Holding losartan/hctz due to AKI  prior CVA with residual right sided weakness. No new symptoms. Obtain pt    Code Status: full Family Communication: d/w patient, Therapist, sports. Recently updated her family (indicate person spoken with, relationship, and if by phone, the number) Disposition Plan: home pend improvement    Consultants:  none  Procedures:  none  Antibiotics:  noen (indicate start date, and stop date if known)  HPI/Subjective: Alert, no distress   Objective: Vitals:   05/03/17 0656 05/03/17 1347  BP: (!) 184/72 (!) 184/68  Pulse: 92 89  Resp:  19   Temp:  98.4 F (36.9 C)    Intake/Output Summary (Last 24 hours) at 05/03/17 1506 Last data filed at 05/03/17 1248  Gross per 24 hour  Intake              480 ml  Output              250 ml  Net              230 ml   Filed Weights   04/30/17 1704 05/01/17 0956 05/02/17 0500  Weight: 89.4 kg (197 lb) 90.5 kg (199 lb 8.3 oz) 94.3 kg (207 lb 14.3 oz)    Exam:   General:  No distress   Cardiovascular: s1,s2 rrr  Respiratory: CTA BL  Abdomen: soft, nt, nd   Musculoskeletal: no leg edema    Data Reviewed: Basic Metabolic Panel:  Recent Labs Lab 04/30/17 1740 05/01/17 0624 05/02/17 0550 05/03/17 0558  NA 138 139 137 134*  K 2.8* 3.1* 4.0 3.9  CL 104 103 107 104  CO2 23 27 24 22   GLUCOSE 41* 75 196* 178*  BUN 30* 26* 29* 34*  CREATININE 1.57* 1.57* 1.68* 1.74*  CALCIUM 9.0 8.7* 8.3* 8.6*  MG 2.3  --   --   --    Liver Function Tests:  Recent Labs Lab 04/30/17 1740  AST 30  ALT 14  ALKPHOS 49  BILITOT 1.2  PROT 8.1  ALBUMIN 3.8   No results for input(s): LIPASE, AMYLASE in the last 168 hours. No  results for input(s): AMMONIA in the last 168 hours. CBC:  Recent Labs Lab 04/30/17 1740 05/01/17 0624  WBC 6.7 8.5  NEUTROABS 4.8  --   HGB 15.0 14.3  HCT 45.5 44.5  MCV 87.8 88.6  PLT 202 208   Cardiac Enzymes:  Recent Labs Lab 04/30/17 1740  TROPONINI 0.04*   BNP (last 3 results) No results for input(s): BNP in the last 8760 hours.  ProBNP (last 3 results) No results for input(s): PROBNP in the last 8760 hours.  CBG:  Recent Labs Lab 05/02/17 1154 05/02/17 1607 05/02/17 2116 05/03/17 0736 05/03/17 1136  GLUCAP 218* 207* 214* 177* 193*    Recent Results (from the past 240 hour(s))  MRSA PCR Screening     Status: None   Collection Time: 05/01/17 10:00 AM  Result Value Ref Range Status   MRSA by PCR NEGATIVE NEGATIVE Final    Comment:        The GeneXpert MRSA Assay (FDA approved for NASAL specimens only), is one component of  a comprehensive MRSA colonization surveillance program. It is not intended to diagnose MRSA infection nor to guide or monitor treatment for MRSA infections.      Studies: US Renal  Result Date: 05/02/2017 CLINICAL DATA:  Acute kidney injury. EXAM: RENAL / URINARY TRACT ULTRASOUND COMPLETE COMPARISON:  CT 03/17/2012 FINDINGS: Right Kidney: Length: 11.9 cm. Echogenicity within normal limits. No mass or hydronephrosis visualized. Previous right renal stone is not seen, may have passed in the interim or not well visualized sonographically. Left Kidney: Length: 13.1 cm. Echogenicity within normal limits. No mass or hydronephrosis visualized. Previous left renal stones are not seen, may have passed in the interim or not visualize sonographically. Bladder: Appears normal for degree of bladder distention. Neither ureteral jet was visualized. IMPRESSION: No hydronephrosis or obstructive uropathy. Unremarkable renal ultrasound. Electronically Signed   By: Jeb Levering M.D.   On: 05/02/2017 22:35    Scheduled Meds: . amLODipine  10 mg Oral Daily  . aspirin  325 mg Oral Daily  . carvedilol  6.25 mg Oral BID WC  . gabapentin  200 mg Oral TID  . heparin  5,000 Units Subcutaneous Q8H  . hydrALAZINE  75 mg Oral Q8H  . insulin aspart  0-9 Units Subcutaneous TID WC  . living well with diabetes book   Does not apply Once  . nystatin   Topical BID  . potassium chloride  20 mEq Oral Daily   Continuous Infusions:   Principal Problem:   Hypoglycemia due to insulin Active Problems:   Hypertension   Neuropathy   Hypokalemia   AKI (acute kidney injury) ([place])   Hypoglycemia    Time spent: >35 minutes     Reyne Dumas  [facility] Pager [phone]. If 7PM-7AM, please contact night-coverage at [email or web address], password [facility] 05/03/2017, 3:06 PM  LOS: 1 day

## 2017-05-04 LAB — GLUCOSE, CAPILLARY
GLUCOSE-CAPILLARY: 163 mg/dL — AB (ref 65–99)
GLUCOSE-CAPILLARY: 196 mg/dL — AB (ref 65–99)
GLUCOSE-CAPILLARY: 176 mg/dL — AB (ref 65–99)
Glucose-Capillary: 156 mg/dL — ABNORMAL HIGH (ref 65–99)

## 2017-05-04 LAB — COMPREHENSIVE METABOLIC PANEL
ALT: 16 U/L (ref 14–54)
AST: 23 U/L (ref 15–41)
Albumin: 3.1 g/dL — ABNORMAL LOW (ref 3.5–5.0)
Alkaline Phosphatase: 39 U/L (ref 38–126)
Anion gap: 6 (ref 5–15)
BUN: 40 mg/dL — ABNORMAL HIGH (ref 6–20)
CHLORIDE: 107 mmol/L (ref 101–111)
CO2: 23 mmol/L (ref 22–32)
CREATININE: 1.83 mg/dL — AB (ref 0.44–1.00)
Calcium: 8.7 mg/dL — ABNORMAL LOW (ref 8.9–10.3)
GFR, EST AFRICAN AMERICAN: 33 mL/min — AB (ref >60)
GFR, EST NON AFRICAN AMERICAN: 28 mL/min — AB (ref >60)
Glucose, Bld: 204 mg/dL — ABNORMAL HIGH (ref 65–99)
POTASSIUM: 4.4 mmol/L (ref 3.5–5.1)
Sodium: 136 mmol/L (ref 135–145)
Total Bilirubin: 1 mg/dL (ref 0.3–1.2)
Total Protein: 6.7 g/dL (ref 6.5–8.1)

## 2017-05-04 MED ORDER — HYDRALAZINE HCL 25 MG PO TABS
100.0000 mg | ORAL_TABLET | Freq: Three times a day (TID) | ORAL | Status: DC
  Administered 2017-05-04 – 2017-05-08 (×12): 100 mg via ORAL
  Filled 2017-05-04 (×13): qty 4

## 2017-05-04 MED ORDER — SODIUM CHLORIDE 0.9 % IV SOLN
INTRAVENOUS | Status: DC
  Administered 2017-05-04 – 2017-05-05 (×4): via INTRAVENOUS

## 2017-05-04 NOTE — Evaluation (Signed)
Physical Therapy Evaluation Patient Details Name: CHANTEE CERINO MRN: [id number] DOB: 1950-12-08 Today's Date: 05/04/2017   History of Present Illness  Madison Reed is a 3UU white female who comes to [facility] on 7/20 due to symptomatic hypoglycemia and AKI. 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 Pt lives in a single storyhouse with husband, and assistance from daughter with househwork. At [place] she performs community distance amb c RW. She reports 4-6 falls estimate in the past 6 months which she attributes to slick bedsheets.   Clinical Impression  Pt admitted with above diagnosis. Pt currently with functional limitations due to the deficits listed below (see "PT Problem List"). Upon entry, the patient is received semirecumbent in bed, no family/caregiver present. The pt is awake and agreeable to participate. No acute distress noted at this time, pt only reporting some mild, nonconcerning pain in her back which is chronic but exacerbated from being in bed for long periods. Functional mobility assessment demonstrates moderate-heavy strength impairment, the pt now requiring Mod-assist physical assistance for bed mobility and transfers, and gait now slow and distance limited, with a significant LOB turning in hall that requires physical assistance for balance correction: the patient performs these with modified independence at baseline. The patient is at high risk for falls as evidence by gait speed <0.35m/s, forward reach <5", and multiple LOB demonstrated throughout session. Recommending increased familial support for bed mobility and amb upon return to home. Husband works full time, but daughter (an Tourist information centre manager) is more available to assist at this point in time. Pt will benefit from skilled PT intervention to increase independence and safety with basic mobility in preparation for discharge to the venue listed below.       Follow Up Recommendations Home health  PT;Supervision for mobility/OOB;Supervision - Intermittent (pt is current with HHPT x2weeks )    Equipment Recommendations  None recommended by PT    Recommendations for Other Services       Precautions / Restrictions Precautions Precautions: Fall Restrictions Weight Bearing Restrictions: No      Mobility  Bed Mobility Overal bed mobility: Needs Assistance Bed Mobility: Supine to Sit     Supine to sit: Mod assist     General bed mobility comments: PT offers BUE for patient to pull self up to sitting   Transfers Overall transfer level: Needs assistance Equipment used: Rolling walker (2 wheeled) Transfers: Sit to/from Stand Sit to Stand: Mod assist         General transfer comment: Performed 5x from chair with modA and RW.   Ambulation/Gait Ambulation/Gait assistance: Min assist Ambulation Distance (Feet): 80 Feet Assistive device: Rolling walker (2 wheeled) Gait Pattern/deviations: Step-through pattern   Gait velocity interpretation: <1.8 ft/sec, indicative of risk for recurrent falls General Gait Details: slow, steady, has significan tLOB when paused and preparing to run around, requires minA to avoid fall to floor. VC required for safe distance RW during turns.   Stairs            Wheelchair Mobility    Modified Rankin (Stroke Patients Only)       Balance Overall balance assessment: History of Falls;Needs assistance         Standing balance support: During functional activity;Bilateral upper extremity supported Standing balance-Leahy Scale: Poor               High level balance activites: Turns;Sudden stops;Direction changes  Pertinent Vitals/Pain Pain Assessment: 0-10 Pain Score: 0-No pain    Home Living Family/patient expects to be discharged to:: Private residence Living Arrangements: Spouse/significant other Available Help at Discharge: Family Type of Home: House Home Access: Stairs to enter Entrance  Stairs-Rails: None Entrance Stairs-Number of Steps: 2 Home Layout: Laundry or work area in basement (daughter assists with housework ) Home Equipment: Environmental consultant - 2 wheels;Wheelchair - manual;Cane - single point      Prior Function Level of Independence: Independent with assistive device(s)         Comments: RW for limited community distances      Hand Dominance   Dominant Hand: Right    Extremity/Trunk Assessment   Upper Extremity Assessment Upper Extremity Assessment: Generalized weakness    Lower Extremity Assessment Lower Extremity Assessment: Generalized weakness       Communication      Cognition Arousal/Alertness: Awake/alert Behavior During Therapy: WFL for tasks assessed/performed Overall Cognitive Status: Within Functional Limits for tasks assessed                                        General Comments      Exercises     Assessment/Plan    PT Assessment Patient needs continued PT services  PT Problem List Decreased activity tolerance;Decreased mobility;Decreased knowledge of use of DME;Decreased safety awareness;Decreased balance;Decreased strength;Obesity       PT Treatment Interventions DME instruction;Gait training;Stair training;Functional mobility training;Therapeutic activities;Therapeutic exercise;Balance training;Patient/family education    PT Goals (Current goals can be found in the Care Plan section)  Acute Rehab PT Goals Patient Stated Goal: Regain strength  PT Goal Formulation: With patient Time For Goal Achievement: 05/18/17 Potential to Achieve Goals: Fair    Frequency Min 2X/week   Barriers to discharge   will need assistance up from bed and low seats     Co-evaluation               AM-PAC PT "6 Clicks" Daily Activity  Outcome Measure Difficulty turning over in bed (including adjusting bedclothes, sheets and blankets)?: Total Difficulty moving from lying on back to sitting on the side of the bed? :  Total Difficulty sitting down on and standing up from a chair with arms (e.g., wheelchair, bedside commode, etc,.)?: Total Help needed moving to and from a bed to chair (including a wheelchair)?: A Lot Help needed walking in hospital room?: A Lot Help needed climbing 3-5 steps with a railing? : Total 6 Click Score: 8    End of Session Equipment Utilized During Treatment: Gait belt Activity Tolerance: Patient tolerated treatment well;Patient limited by fatigue Patient left: in chair;with call bell/phone within reach;Other (comment) (c lunch tray presented. )   PT Visit Diagnosis: Muscle weakness (generalized) (M62.81);History of falling (Z91.81)    Time: 1120-1150 PT Time Calculation (min) (ACUTE ONLY): 30 min   Charges:   PT Evaluation $PT Eval Low Complexity: 1 Procedure PT Treatments $Therapeutic Activity: 8-22 mins   PT G Codes:        12:04 PM, 14-May-2017 Etta Grandchild, PT, DPT Physical Therapist - [place] [phone] (Office)   Yutaka Holberg C 05/14/2017, 12:01 PM

## 2017-05-04 NOTE — Consult Note (Signed)
Reason for Consult: Acute kidney injury Referring Physician: Dr. Reyne Dumas  Madison Reed is an 66 y.o. female.  HPI: She is a patient was history of hypertension, diabetes, history of remote CVA presently came in because of episode of hypoglycemia. Presently patient says that she's feeling much better. She denies any nausea or vomiting. Patient also denies any previous history of renal failure, kidney stone. No history of diabetic retinopathy or leg swelling.  Past Medical History:  Diagnosis Date  . Abdominal aneurysm ([place])   . Bone spur   . Chronic back pain   . CVA (cerebral infarction)   . Diabetes mellitus without complication ([place])   . Hypertension   . Kidney stones   . Neuropathy   . Psoriasis   . Stroke ([place])   . Wound, open, foot     Past Surgical History:  Procedure Laterality Date  . ABDOMINAL HYSTERECTOMY    . BLADDER SUSPENSION    . VESICOVAGINAL FISTULA CLOSURE W/ TAH      Family History  Problem Relation Age of Onset  . Aneurysm Mother   . Diabetes Mother   . Hypertension Mother     Social History:  reports that she has quit smoking. She smoked 0.00 packs per day for 30.00 years. She has never used smokeless tobacco. She reports that she does not drink alcohol or use drugs.  Allergies:  Allergies  Allergen Reactions  . Penicillins Itching    Has patient had a PCN reaction causing immediate rash, facial/tongue/throat swelling, SOB or lightheadedness with hypotension: Yes Has patient had a PCN reaction causing severe rash involving mucus membranes or skin necrosis: No Has patient had a PCN reaction that required hospitalization: No Has patient had a PCN reaction occurring within the last 10 years: No If all of the above answers are "NO", then may proceed with Cephalosporin use.   . Clonidine Derivatives Other (See Comments)    Numbness  to tongue face, and arm  . Codeine Itching  . Hydrocodone-Acetaminophen Itching  . Shellfish Allergy Itching  .  Sulfa Antibiotics Itching and Rash    Medications: I have reviewed the patient's current medications.  Results for orders placed or performed during the hospital encounter of 04/30/17 (from the past 48 hour(s))  Glucose, capillary     Status: Abnormal   Collection Time: 05/02/17 11:54 AM  Result Value Ref Range   Glucose-Capillary 218 (H) 65 - 99 mg/dL  Glucose, capillary     Status: Abnormal   Collection Time: 05/02/17  4:07 PM  Result Value Ref Range   Glucose-Capillary 207 (H) 65 - 99 mg/dL  Glucose, capillary     Status: Abnormal   Collection Time: 05/02/17  9:16 PM  Result Value Ref Range   Glucose-Capillary 214 (H) 65 - 99 mg/dL   Comment 1 Notify RN    Comment 2 Document in Chart   Basic metabolic panel     Status: Abnormal   Collection Time: 05/03/17  5:58 AM  Result Value Ref Range   Sodium 134 (L) 135 - 145 mmol/L   Potassium 3.9 3.5 - 5.1 mmol/L   Chloride 104 101 - 111 mmol/L   CO2 22 22 - 32 mmol/L   Glucose, Bld 178 (H) 65 - 99 mg/dL   BUN 34 (H) 6 - 20 mg/dL   Creatinine, Ser 1.74 (H) 0.44 - 1.00 mg/dL   Calcium 8.6 (L) 8.9 - 10.3 mg/dL   GFR calc non Af Amer 30 (L) >60  mL/min   GFR calc Af Amer 35 (L) >60 mL/min    Comment: (NOTE) The eGFR has been calculated using the CKD EPI equation. This calculation has not been validated in all clinical situations. eGFR's persistently <60 mL/min signify possible Chronic Kidney Disease.    Anion gap 8 5 - 15  Glucose, capillary     Status: Abnormal   Collection Time: 05/03/17  7:36 AM  Result Value Ref Range   Glucose-Capillary 177 (H) 65 - 99 mg/dL  Glucose, capillary     Status: Abnormal   Collection Time: 05/03/17 11:36 AM  Result Value Ref Range   Glucose-Capillary 193 (H) 65 - 99 mg/dL   Comment 1 Notify RN   Glucose, capillary     Status: Abnormal   Collection Time: 05/03/17  4:44 PM  Result Value Ref Range   Glucose-Capillary 227 (H) 65 - 99 mg/dL   Comment 1 Notify RN   Glucose, capillary     Status:  Abnormal   Collection Time: 05/03/17  9:09 PM  Result Value Ref Range   Glucose-Capillary 210 (H) 65 - 99 mg/dL   Comment 1 Notify RN    Comment 2 Document in Chart   Glucose, capillary     Status: Abnormal   Collection Time: 05/04/17  7:28 AM  Result Value Ref Range   Glucose-Capillary 156 (H) 65 - 99 mg/dL   Comment 1 Notify RN    Comment 2 Document in Chart     US Renal  Result Date: 05/02/2017 CLINICAL DATA:  Acute kidney injury. EXAM: RENAL / URINARY TRACT ULTRASOUND COMPLETE COMPARISON:  CT 03/17/2012 FINDINGS: Right Kidney: Length: 11.9 cm. Echogenicity within normal limits. No mass or hydronephrosis visualized. Previous right renal stone is not seen, may have passed in the interim or not well visualized sonographically. Left Kidney: Length: 13.1 cm. Echogenicity within normal limits. No mass or hydronephrosis visualized. Previous left renal stones are not seen, may have passed in the interim or not visualize sonographically. Bladder: Appears normal for degree of bladder distention. Neither ureteral jet was visualized. IMPRESSION: No hydronephrosis or obstructive uropathy. Unremarkable renal ultrasound. Electronically Signed   By: Jeb Levering M.D.   On: 05/02/2017 22:35    Review of Systems  Constitutional: Negative for chills and fever.  Respiratory: Negative for shortness of breath.   Cardiovascular: Negative for orthopnea and leg swelling.  Gastrointestinal: Negative for nausea and vomiting.   Blood pressure (!) 182/88, pulse 89, temperature 98.6 F (37 C), temperature source Oral, resp. rate 20, height 5' 6"  (1.676 m), weight 94.3 kg (207 lb 14.3 oz), SpO2 92 %. Physical Exam  Constitutional: She is oriented to person, place, and time. No distress.  Eyes: No scleral icterus.  Neck: No JVD present.  Cardiovascular: Normal rate and regular rhythm.   No murmur heard. Respiratory: No respiratory distress. She has no wheezes. She has no rales.  GI: She exhibits no  distension. There is no tenderness.  Neurological: She is alert and oriented to person, place, and time.    Assessment/Plan: Problem #1 renal failure: Possibly acute. Her last creatinine was on 05/23/2015 which was 0.96. In between there is no other blood work to compare. Presently she came with creatinine of 1.53 and her creatinine since then seems to be increasing. No previous history of renal failure or kidney stone. Ultrasound of the kidneys showed no hydronephrosis. Etiology for her renal failure could be secondary to diabetes/hypertension/medication such as Cozaar Problem #2 hypoglycemia: Improving. Patient with  history of diabetes for the last 2 years Problem #3 hypertension: Her blood pressure is reasonably controlled Problem #4 proteinuria: Presently she is 300 mg/dL of proteinuria. Most likely from diabetes however other etiologies cannot be ruled out. Problem #5 hypokalemia: Her potassium is corrected Problem #6 history of CVA Problem #7 history of abdominal aortic aneurysm Plan: Will start patient on normal saline at 125 mL/h 2] will check ANA, complement, hepatitis B surface antigen, hepatitis C antibody 3] will do 24-hour urine for protein and creatinine clearance. 4] will check renal panel in the morning.  Kamoria Lucien S 05/04/2017, 7:57 AM

## 2017-05-04 NOTE — Progress Notes (Addendum)
[facility] PROGRESS NOTE  Madison Reed:[id number] DOB: 12/15/1950 DOA: 04/30/2017 PCP: Celene Squibb, MD  Brief summary   66 y.o. female with hx of DM on glucotrol and Lantus, HTN on Losartan Hct, prior CVA, chronic back pain on Dilaudid, presented to the ER with symptomatic low BS.  She has been feeling weak and diaphoresis.  She has been on no new BS medication, and has been eating normally.  No CP or SOB.  Evalatuion in the ER showed BS of 50 and 60, and she was given food and regular Coke.  She was also found to have a K of 2.8 and a Cr of 1.6.  She was given D5 at 75cc per hour, and hospitalist was asked to admit her for hypoglycemia, AKI, and hypokalemia.   Assessment/Plan: Hypoglycemia likely due to glipizide+insulin in the setting of AKI. Improved with continuous  iv dextrose drip, now off dextrose and CBG has been stable. 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 Renal ultrasound unremarkable, no nephrolithiasis or hydronephrosis -creatinine is not to baseline level yet, despite gentle iv hydration. Appreciate nephrology workup, currently undergoing workup for autoimmune causes, 24-hour urine protein collection underway,and labs pending  Strict I's and O's   Hypokalemia, improved with replacement. Hold hctz   HTN. Uncontrolled. Continue amlodipine, Coreg, increase hydralazine to 100 mg 3 times a day,   Holding losartan/hctz due to AKI  prior CVA with residual right sided weakness. No new symptoms. Obtain pt    Code Status: full Family Communication: Discussed with daughter by the bedside Disposition Plan: home pend improvement     Consultants:  none  Procedures:  none  Antibiotics:  noen (indicate start date, and stop date if known)  HPI/Subjective: Upset that she does not get to go home today  Objective: Vitals:   05/03/17 2154 05/04/17 0525  BP: (!) 174/59 (!) 182/88  Pulse: 82 89  Resp: 19 20  Temp: 98.5 F (36.9 C) 98.6 F (37 C)    Intake/Output Summary (Last 24 hours) at 05/04/17 1355 Last data filed at 05/04/17 0800  Gross per 24 hour  Intake              240 ml  Output             1601 ml  Net            -1361 ml   Filed Weights   04/30/17 1704 05/01/17 0956 05/02/17 0500  Weight: 89.4 kg (197 lb) 90.5 kg (199 lb 8.3 oz) 94.3 kg (207 lb 14.3 oz)    Exam:   General:  No distress   Cardiovascular: s1,s2 rrr  Respiratory: CTA BL  Abdomen: soft, nt, nd   Musculoskeletal: no leg edema    Data Reviewed: Basic Metabolic Panel:  Recent Labs Lab 04/30/17 1740 05/01/17 0624 05/02/17 0550 05/03/17 0558  NA 138 139 137 134*  K 2.8* 3.1* 4.0 3.9  CL 104 103 107 104  CO2 23 27 24 22   GLUCOSE 41* 75 196* 178*  BUN 30* 26* 29* 34*  CREATININE 1.57* 1.57* 1.68* 1.74*  CALCIUM 9.0 8.7* 8.3* 8.6*  MG 2.3  --   --   --  Liver Function Tests:  Recent Labs Lab 04/30/17 1740  AST 30  ALT 14  ALKPHOS 49  BILITOT 1.2  PROT 8.1  ALBUMIN 3.8   No results for input(s): LIPASE, AMYLASE in the last 168 hours. No results for input(s): AMMONIA in the last 168 hours. CBC:  Recent Labs Lab 04/30/17 1740 05/01/17 0624  WBC 6.7 8.5  NEUTROABS 4.8  --   HGB 15.0 14.3  HCT 45.5 44.5  MCV 87.8 88.6  PLT 202 208   Cardiac Enzymes:  Recent Labs Lab 04/30/17 1740  TROPONINI 0.04*   BNP (last 3 results) No results for input(s): BNP in the last 8760 hours.  ProBNP (last 3 results) No results for input(s): PROBNP in the last 8760 hours.  CBG:  Recent Labs Lab 05/03/17 1136 05/03/17 1644 05/03/17 2109 05/04/17 0728 05/04/17 1114  GLUCAP 193* 227* 210*  156* 163*    Recent Results (from the past 240 hour(s))  MRSA PCR Screening     Status: None   Collection Time: 05/01/17 10:00 AM  Result Value Ref Range Status   MRSA by PCR NEGATIVE NEGATIVE Final    Comment:        The GeneXpert MRSA Assay (FDA approved for NASAL specimens only), is one component of a comprehensive MRSA colonization surveillance program. It is not intended to diagnose MRSA infection nor to guide or monitor treatment for MRSA infections.      Studies: No results found.  Scheduled Meds: . amLODipine  10 mg Oral Daily  . aspirin  325 mg Oral Daily  . carvedilol  6.25 mg Oral BID WC  . gabapentin  200 mg Oral TID  . heparin  5,000 Units Subcutaneous Q8H  . hydrALAZINE  75 mg Oral Q8H  . insulin aspart  0-9 Units Subcutaneous TID WC  . insulin glargine  8 Units Subcutaneous Daily  . living well with diabetes book   Does not apply Once  . nystatin   Topical BID  . potassium chloride  20 mEq Oral Daily   Continuous Infusions: . sodium chloride 125 mL/hr at 05/04/17 0815    Principal Problem:   Hypoglycemia due to insulin Active Problems:   Hypertension   Neuropathy   Hypokalemia   AKI (acute kidney injury) ([place])   Hypoglycemia    Time spent: >35 minutes     Reyne Dumas  [facility] Pager [phone]. If 7PM-7AM, please contact night-coverage at [email or web address], password [facility] 05/04/2017, 1:55 PM  LOS: 2 days

## 2017-05-05 DIAGNOSIS — E162 Hypoglycemia, unspecified: Secondary | ICD-10-CM

## 2017-05-05 LAB — RENAL FUNCTION PANEL
ALBUMIN: 3.1 g/dL — AB (ref 3.5–5.0)
ANION GAP: 7 (ref 5–15)
BUN: 39 mg/dL — ABNORMAL HIGH (ref 6–20)
CO2: 23 mmol/L (ref 22–32)
Calcium: 8.8 mg/dL — ABNORMAL LOW (ref 8.9–10.3)
Chloride: 106 mmol/L (ref 101–111)
Creatinine, Ser: 1.57 mg/dL — ABNORMAL HIGH (ref 0.44–1.00)
GFR calc Af Amer: 39 mL/min — ABNORMAL LOW (ref >60)
GFR calc non Af Amer: 34 mL/min — ABNORMAL LOW (ref >60)
GLUCOSE: 160 mg/dL — AB (ref 65–99)
PHOSPHORUS: 4.2 mg/dL (ref 2.5–4.6)
POTASSIUM: 4.2 mmol/L (ref 3.5–5.1)
Sodium: 136 mmol/L (ref 135–145)

## 2017-05-05 LAB — PROTEIN, URINE, 24 HOUR
COLLECTION INTERVAL-UPROT: 24 h
PROTEIN, URINE: 189 mg/dL
Protein, 24H Urine: 2174 mg/d — ABNORMAL HIGH (ref 50–100)
Urine Total Volume-UPROT: 1150 mL

## 2017-05-05 LAB — C4 COMPLEMENT: Complement C4, Body Fluid: 6 mg/dL — ABNORMAL LOW (ref 14–44)

## 2017-05-05 LAB — HEPATITIS C ANTIBODY: HCV Ab: 0.1 s/co ratio (ref 0.0–0.9)

## 2017-05-05 LAB — GLUCOSE, CAPILLARY
GLUCOSE-CAPILLARY: 170 mg/dL — AB (ref 65–99)
GLUCOSE-CAPILLARY: 181 mg/dL — AB (ref 65–99)
Glucose-Capillary: 161 mg/dL — ABNORMAL HIGH (ref 65–99)
Glucose-Capillary: 161 mg/dL — ABNORMAL HIGH (ref 65–99)

## 2017-05-05 LAB — FANA STAINING PATTERNS
Nucleolar Pattern: 1:1280 {titer}
Speckled Pattern: 1:640 {titer}

## 2017-05-05 LAB — COMPLEMENT, TOTAL: Compl, Total (CH50): 58 U/mL (ref >41)

## 2017-05-05 LAB — MPO/PR-3 (ANCA) ANTIBODIES

## 2017-05-05 LAB — HEPATITIS B SURFACE ANTIGEN: Hepatitis B Surface Ag: NEGATIVE

## 2017-05-05 LAB — C3 COMPLEMENT: C3 Complement: 130 mg/dL (ref 82–167)

## 2017-05-05 LAB — ANTINUCLEAR ANTIBODIES, IFA: ANA Ab, IFA: POSITIVE — AB

## 2017-05-05 NOTE — Progress Notes (Signed)
Madison Reed  MRN: [id number]  DOB/AGE: 1951/04/18 66 y.o.  Primary Care Physician:Hall, Edwinna Areola, MD  Admit date: 04/30/2017  Chief Complaint:  Chief Complaint  Patient presents with  . Hypoglycemia    S-Pt presented on  04/30/2017 with  Chief Complaint  Patient presents with  . Hypoglycemia  .    Pt offers no new complaints.  Meds . amLODipine  10 mg Oral Daily  . aspirin  325 mg Oral Daily  . carvedilol  6.25 mg Oral BID WC  . gabapentin  200 mg Oral TID  . heparin  5,000 Units Subcutaneous Q8H  . hydrALAZINE  100 mg Oral Q8H  . insulin aspart  0-9 Units Subcutaneous TID WC  . insulin glargine  8 Units Subcutaneous Daily  . living well with diabetes book   Does not apply Once  . nystatin   Topical BID  . potassium chloride  20 mEq Oral Daily         Physical Exam: Vital signs in last 24 hours: Temp:  [97.9 F (36.6 C)-99 F (37.2 C)] 98 F (36.7 C) (07/25 0448) Pulse Rate:  [80-84] 84 (07/25 0448) Resp:  [18-20] 18 (07/25 0448) BP: (133-184)/(61-82) 133/82 (07/25 0448) SpO2:  [90 %-93 %] 91 % (07/25 0448) Weight change:  Last BM Date: 04/30/17  Intake/Output from previous day: 07/24 0701 - 07/25 0700 In: 3411.7 [P.O.:720; I.V.:2691.7] Out: 600 [Urine:600] No intake/output data recorded.   Physical Exam: General- pt is awake,alert, oriented to time place and person Resp- No acute REsp distress,  NO Rhonchi CVS- S1S2 regular in rate and rhythm GIT- BS+, soft, NT, ND EXT- trace LE Edema, no Cyanosis   Lab Results: hgb 14.3 BMET  Recent Labs  05/04/17 1419 05/05/17 0655  NA 136 136  K 4.4 4.2  CL 107 106  CO2 23 23  GLUCOSE 204* 160*  BUN 40* 39*  CREATININE 1.83* 1.57*  CALCIUM 8.7* 8.8*   Creat trend 2018 1.5=> 1.8=>1.57   MICRO Recent Results (from the past 240 hour(s))  MRSA PCR Screening     Status: None   Collection Time: 05/01/17 10:00 AM  Result Value Ref Range Status   MRSA by PCR NEGATIVE NEGATIVE Final    Comment:         The GeneXpert MRSA Assay (FDA approved for NASAL specimens only), is one component of a comprehensive MRSA colonization surveillance program. It is not intended to diagnose MRSA infection nor to guide or monitor treatment for MRSA infections.       Lab Results  Component Value Date   CALCIUM 8.8 (L) 05/05/2017   PHOS 4.2 05/05/2017  Alb 3.1 Corrected cal 9.5   C3- normal CH50-normal C4 - low ESR 70  HepbB/hp C-Negative        Impression: 1)Renal  AKI secondary to Prerenal/ATN                CKD stage not sure as not much data availble  .                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 On Vasodilators.    3)Anemia HGb at goal (9--11)   4)CKD Mineral-Bone Disorder Calcium at goal Phos is at goal  5)DM-admitted with hypoglycemia Primary MD following  6)Electrolytes Normokalemic NOrmonatremic   7)Acid base Co2 at goal  Plan:  AKI better,creat trending down. Awaiting rest of Autoimmune work up. Pt with C4 on lower side I educated pt about improvement in GFR and awaiting Autoimmune work up and that pt may need biopsy. Pt voiced understanding.       Marquel Spoto S 05/05/2017, 9:28 AM

## 2017-05-05 NOTE — Progress Notes (Signed)
PROGRESS NOTE    Madison Reed  BOF:[id number] DOB: 1951/08/18 DOA: 04/30/2017 PCP: Celene Squibb, MD     Brief Narrative:  66 year old woman admitted to the hospital on 7/20 for symptomatic hypoglycemia. She was also found to have new acute renal failure with a creatinine of 1.6. Her hypoglycemia has resolved and she is now off dextrose-containing fluids. Nephrology is on board and is working up her acute renal failure, 24-hour urine protein and autoimmune workup is in process.   Assessment & Plan:   Principal Problem:   Hypoglycemia due to insulin Active Problems:   Hypertension   Neuropathy   Hypokalemia   AKI (acute kidney injury) ([place])   Hypoglycemia   Hypoglycemia -Resolved. -Likely due to glipizide plus insulin in the setting of acute renal failure. -Blood sugar stabilized off dextrose fluids. -Continue sliding scale, low-dose Lantus.  Acute renal failure -Creatinine down to 1.57 from a high of 1.8. -Discussed case with nephrology today, currently autoimmune and 24-hour urine protein collection is underway. They would like to keep patient in the hospital today as pending results of workup mainly renal biopsy.  Hypokalemia -Replaced  Hypertension -Fair control on current medications.  History of CVA -With residual right-sided weakness, at baseline.   DVT prophylaxis: Subcutaneous heparin Code Status: Code Family Communication: Patient only Disposition Plan: Anticipate discharge home in 24 hours  Consultants:   Nephrology  Procedures:   None  Antimicrobials:  Anti-infectives    None       Subjective: Has no complaints today, is anxious for discharge home  Objective: Vitals:   05/04/17 1442 05/04/17 2023 05/04/17 2050 05/05/17 0448  BP: (!) 159/61  (!) 184/71 133/82  Pulse: 80  80 84  Resp: 20  18 18   Temp: 97.9 F (36.6 C)  99 F (37.2 C) 98 F (36.7 C)  TempSrc: Oral  Oral Oral  SpO2: 93% 90% 92% 91%  Weight:      Height:         Intake/Output Summary (Last 24 hours) at 05/05/17 1124 Last data filed at 05/05/17 0800  Gross per 24 hour  Intake          3411.67 ml  Output              600 ml  Net          2811.67 ml   Filed Weights   04/30/17 1704 05/01/17 0956 05/02/17 0500  Weight: 89.4 kg (197 lb) 90.5 kg (199 lb 8.3 oz) 94.3 kg (207 lb 14.3 oz)    Examination:  General exam: Alert, awake, oriented x 3 Respiratory system: Clear to auscultation. Respiratory effort normal. Cardiovascular system:RRR. No murmurs, rubs, gallops. Gastrointestinal system: Abdomen is nondistended, soft and nontender. No organomegaly or masses felt. Normal bowel sounds heard. Central nervous system: Right greater than left lower and upper extremity weakness Extremities: 1+ pitting edema bilaterally, positive pedal pulses Skin: No rashes, lesions or ulcers Psychiatry: Judgement and insight appear normal. Mood & affect appropriate.     Data Reviewed: I have personally reviewed following labs and imaging studies  CBC:  Recent Labs Lab 04/30/17 1740 05/01/17 0624  WBC 6.7 8.5  NEUTROABS 4.8  --   HGB 15.0 14.3  HCT 45.5 44.5  MCV 87.8 88.6  PLT 202 778   Basic Metabolic Panel:  Recent Labs Lab 04/30/17 1740 05/01/17 0624 05/02/17 0550 05/03/17 0558 05/04/17 1419 05/05/17 0655  NA 138 139 137 134* 136 136  K 2.8* 3.1* 4.0  3.9 4.4 4.2  CL 104 103 107 104 107 106  CO2 23 27 24 22 23 23   GLUCOSE 41* 75 196* 178* 204* 160*  BUN 30* 26* 29* 34* 40* 39*  CREATININE 1.57* 1.57* 1.68* 1.74* 1.83* 1.57*  CALCIUM 9.0 8.7* 8.3* 8.6* 8.7* 8.8*  MG 2.3  --   --   --   --   --   PHOS  --   --   --   --   --  4.2   GFR: Estimated Creatinine Clearance: 42.4 mL/min (A) (by C-G formula based on SCr of 1.57 mg/dL (H)). Liver Function Tests:  Recent Labs Lab 04/30/17 1740 05/04/17 1419 05/05/17 0655  AST 30 23  --   ALT 14 16  --   ALKPHOS 49 39  --   BILITOT 1.2 1.0  --   PROT 8.1 6.7  --   ALBUMIN 3.8 3.1*  3.1*   No results for input(s): LIPASE, AMYLASE in the last 168 hours. No results for input(s): AMMONIA in the last 168 hours. Coagulation Profile: No results for input(s): INR, PROTIME in the last 168 hours. Cardiac Enzymes:  Recent Labs Lab 04/30/17 1740  TROPONINI 0.04*   BNP (last 3 results) No results for input(s): PROBNP in the last 8760 hours. HbA1C: No results for input(s): HGBA1C in the last 72 hours. CBG:  Recent Labs Lab 05/04/17 0728 05/04/17 1114 05/04/17 1615 05/04/17 2052 05/05/17 0724  GLUCAP 156* 163* 196* 176* 161*   Lipid Profile: No results for input(s): CHOL, HDL, LDLCALC, TRIG, CHOLHDL, LDLDIRECT in the last 72 hours. Thyroid Function Tests: No results for input(s): TSH, T4TOTAL, FREET4, T3FREE, THYROIDAB in the last 72 hours. Anemia Panel: No results for input(s): VITAMINB12, FOLATE, FERRITIN, TIBC, IRON, RETICCTPCT in the last 72 hours. Urine analysis:    Component Value Date/Time   COLORURINE AMBER (A) 04/30/2017 1746   APPEARANCEUR CLEAR 04/30/2017 1746   LABSPEC 1.011 04/30/2017 1746   PHURINE 6.0 04/30/2017 1746   GLUCOSEU NEGATIVE 04/30/2017 1746   HGBUR NEGATIVE 04/30/2017 1746   BILIRUBINUR NEGATIVE 04/30/2017 1746   KETONESUR NEGATIVE 04/30/2017 1746   PROTEINUR >=300 (A) 04/30/2017 1746   UROBILINOGEN 0.2 08/03/2013 0936   NITRITE POSITIVE (A) 04/30/2017 1746   LEUKOCYTESUR NEGATIVE 04/30/2017 1746   Sepsis Labs: @LABRCNTIP (procalcitonin:4,lacticidven:4)  ) Recent Results (from the past 240 hour(s))  MRSA PCR Screening     Status: None   Collection Time: 05/01/17 10:00 AM  Result Value Ref Range Status   MRSA by PCR NEGATIVE NEGATIVE Final    Comment:        The GeneXpert MRSA Assay (FDA approved for NASAL specimens only), is one component of a comprehensive MRSA colonization surveillance program. It is not intended to diagnose MRSA infection nor to guide or monitor treatment for MRSA infections.           Radiology Studies: No results found.      Scheduled Meds: . amLODipine  10 mg Oral Daily  . aspirin  325 mg Oral Daily  . carvedilol  6.25 mg Oral BID WC  . gabapentin  200 mg Oral TID  . heparin  5,000 Units Subcutaneous Q8H  . hydrALAZINE  100 mg Oral Q8H  . insulin aspart  0-9 Units Subcutaneous TID WC  . insulin glargine  8 Units Subcutaneous Daily  . living well with diabetes book   Does not apply Once  . nystatin   Topical BID  . potassium chloride  20  mEq Oral Daily   Continuous Infusions: . sodium chloride 125 mL/hr at 05/05/17 1030     LOS: 3 days    Time spent: 35 minutes. Greater than 50% of this time was spent in direct contact with the patient coordinating care.     Lelon Frohlich, MD [facility] Pager [phone]  If 7PM-7AM, please contact night-coverage [email or web address] Password [facility] 05/05/2017, 11:24 AM

## 2017-05-06 ENCOUNTER — Inpatient Hospital Stay (HOSPITAL_COMMUNITY): Payer: Medicare Other

## 2017-05-06 DIAGNOSIS — J810 Acute pulmonary edema: Secondary | ICD-10-CM

## 2017-05-06 LAB — BASIC METABOLIC PANEL
Anion gap: 7 (ref 5–15)
BUN: 38 mg/dL — AB (ref 6–20)
CHLORIDE: 108 mmol/L (ref 101–111)
CO2: 21 mmol/L — AB (ref 22–32)
Calcium: 8.9 mg/dL (ref 8.9–10.3)
Creatinine, Ser: 1.46 mg/dL — ABNORMAL HIGH (ref 0.44–1.00)
GFR calc Af Amer: 43 mL/min — ABNORMAL LOW (ref >60)
GFR calc non Af Amer: 37 mL/min — ABNORMAL LOW (ref >60)
GLUCOSE: 157 mg/dL — AB (ref 65–99)
POTASSIUM: 4.2 mmol/L (ref 3.5–5.1)
Sodium: 136 mmol/L (ref 135–145)

## 2017-05-06 LAB — GLUCOSE, CAPILLARY
GLUCOSE-CAPILLARY: 156 mg/dL — AB (ref 65–99)
GLUCOSE-CAPILLARY: 184 mg/dL — AB (ref 65–99)
GLUCOSE-CAPILLARY: 183 mg/dL — AB (ref 65–99)
GLUCOSE-CAPILLARY: 223 mg/dL — AB (ref 65–99)

## 2017-05-06 MED ORDER — ASPIRIN 325 MG PO TABS: 325.0000 mg | ORAL_TABLET | Freq: Every day | ORAL | Status: AC

## 2017-05-06 MED ORDER — FUROSEMIDE 10 MG/ML IJ SOLN
20.0000 mg | Freq: Once | INTRAMUSCULAR | Status: AC
  Administered 2017-05-06: 20 mg via INTRAVENOUS
  Filled 2017-05-06: qty 2

## 2017-05-06 MED ORDER — FUROSEMIDE 10 MG/ML IJ SOLN
40.0000 mg | Freq: Two times a day (BID) | INTRAMUSCULAR | Status: DC
  Administered 2017-05-06 – 2017-05-07 (×2): 40 mg via INTRAVENOUS
  Filled 2017-05-06 (×3): qty 4

## 2017-05-06 MED ORDER — CARVEDILOL 6.25 MG PO TABS: 6.2500 mg | ORAL_TABLET | Freq: Two times a day (BID) | ORAL | 2 refills | Status: AC

## 2017-05-06 NOTE — Progress Notes (Signed)
SATURATION QUALIFICATIONS: (This note is used to comply with regulatory documentation for home oxygen)  Patient Saturations on Room Air at Rest = 89%  Patient Saturations on Room Air while Ambulating = 86%  Patient Saturations on 2 Liters of oxygen while Ambulating = 92%  Please briefly explain why patient needs home oxygen: 

## 2017-05-06 NOTE — Progress Notes (Signed)
Subjective: Interval History: has complaints of difficulty breathing this morning. Presently she is feeling better. She has occasional cough but no sputum production..  Objective: Vital signs in last 24 hours: Temp:  [97.9 F (36.6 C)-98.4 F (36.9 C)] 97.9 F (36.6 C) (07/26 0447) Pulse Rate:  [80-85] 85 (07/26 0447) Resp:  [20] 20 (07/26 0447) BP: (134-218)/(65-91) 176/91 (07/26 0908) SpO2:  [85 %-95 %] 95 % (07/26 0447) Weight change:   Intake/Output from previous day: 07/25 0701 - 07/26 0700 In: 3632.1 [P.O.:720; I.V.:2912.1] Out: -  Intake/Output this shift: Total I/O In: -  Out: 1000 [Urine:1000]  General appearance: alert, cooperative and no distress Resp: diminished breath sounds bilaterally Cardio: regular rate and rhythm Extremities: edema She has trace to 1+ edema  Lab Results: No results for input(s): WBC, HGB, HCT, PLT in the last 72 hours. BMET:  Recent Labs  05/05/17 0655 05/06/17 0622  NA 136 136  K 4.2 4.2  CL 106 108  CO2 23 21*  GLUCOSE 160* 157*  BUN 39* 38*  CREATININE 1.57* 1.46*  CALCIUM 8.8* 8.9   No results for input(s): PTH in the last 72 hours. Iron Studies: No results for input(s): IRON, TIBC, TRANSFERRIN, FERRITIN in the last 72 hours.  Studies/Results: Dg Chest Port 1 View  Result Date: 05/06/2017 CLINICAL DATA:  Shortness of breath, history of diabetes, hypertension, acute renal injury, former smoker. EXAM: PORTABLE CHEST 1 VIEW COMPARISON:  PA and lateral chest x-ray of April 30, 2017 FINDINGS: The lungs are well-expanded. The interstitial markings are coarse and more conspicuous today. There is no pleural effusion or pneumothorax. The cardiac silhouette is enlarged. The central pulmonary vascularity is prominent but stable. There is no definite cephalization. The bony thorax exhibits no acute abnormality. IMPRESSION: Chronic cardiomegaly without significant pulmonary vascular congestion. Increased prominence of the pulmonary  interstitium may reflect mild interstitial edema or less likely pneumonia. Thoracic aortic atherosclerosis. A PA and lateral chest x-ray would be useful. Electronically Signed   By: David  Martinique M.D.   On: 05/06/2017 08:03    I have reviewed the patient's current medications.  Assessment/Plan: Problem #1 renal failure: Possibly acute on chronic. Renal function is progressively improving. She doesn't have any uremic and symptoms. Problem #2 proteinuria: Non-nephrotic range. She has 2100 milligrams of proteinuria over 24 hours. Etiology could be secondary to diabetes/hypertension/obesity related glomerulopathy/since she has positive ANA and low C4 need to rule out lupus nephritis. Problem #3 difficulty breathing: Possibly from fluid overload. She had about 1 L of urine output over the last 24 hours. Problem #4 history of diabetes Problem #5 hypertension: Her blood pressure seems to be somewhat high. Problem #6 history of CVA. Problem #7 bone mineral disorder: Calcium in the range Plan: 1]We'll check anti-double-stranded DNA and anti-smooth antibody 2] we'll start her on Lasix 40 mg IV twice a day 3] we'll check her renal panel in the morning     LOS: 4 days   Madison Reed S 05/06/2017,9:41 AM

## 2017-05-06 NOTE — Care Management Note (Signed)
Case Management Note  Patient Details  Name: ESTY AHUJA MRN: [id number] Date of Birth: [birth year]  If discussed at Long Length of Stay Meetings, dates discussed:  05/06/2017  Sherald Barge, RN 05/06/2017, 2:55 PM

## 2017-05-06 NOTE — Progress Notes (Addendum)
Called by RN the patient developing shortness of breath. Placed on 2 L of oxygen by nasal cannula. She has been getting IV normal saline at 125 ML per hour for acute kidney injury. Creatinine was 1.57  Examined the patient, appears in moderate respiratory distress. On exam Diminished breath sounds bilaterally, crackles auscultated at left lung base  Plan Check CXR Change IV fluids to [facility] Lasix 20 mg IV 1 BMP drawn this morning, follow results.

## 2017-05-06 NOTE — Care Management (Signed)
DC planned for today. Pt requiring supplemental oxygen but does not have chronic resp dx and insurance will not pay for it. Pt can not pay OOP and per MD will stay another night to cont diuresis and hopefully no longer needs oxygen tomorrow and will be ready for DC. CM explained plan to pt and daughter at bedside. Pt upset and "just wants a tomato sandwich". Her daughter will make arrangements for husband to bring patient sandwich.

## 2017-05-06 NOTE — Care Management Note (Signed)
Case Management Note  Patient Details  Name: Madison Reed MRN: [id number] Date of Birth: 1951/06/21  Status of Service:  In process, will continue to follow  If discussed at Long Length of Stay Meetings, dates discussed:  05/06/2017  Additional Comments:  Kiaan Overholser, Chauncey Reading, RN 05/06/2017, 10:30 AM

## 2017-05-06 NOTE — Progress Notes (Signed)
PROGRESS NOTE    Madison Reed  NOM:[id number] DOB: 16-Mar-1951 DOA: 04/30/2017 PCP: Celene Squibb, MD     Brief Narrative:  66 year old woman admitted to the hospital on 7/20 for symptomatic hypoglycemia. She was also found to have new acute renal failure with a creatinine of 1.6. Her hypoglycemia has resolved and she is now off dextrose-containing fluids. Nephrology is on board and is working up her acute renal failure, autoimmune workup is in process. Overnight became short of breath and has a new oxygen requirement. Chest x-ray seems to be consistent with mild pulmonary edema, less likely pneumonia.   Assessment & Plan:   Principal Problem:   Hypoglycemia due to insulin Active Problems:   Hypertension   Neuropathy   Hypokalemia   AKI (acute kidney injury) ([place])   Hypoglycemia   Acute hypoxemic respiratory failure/acute on chronic diastolic CHF -Patient has been receiving high-dose IV fluids for treatment of renal failure, suspect this has thrown her into pulmonary edema/CHF. -Echo from 2014 with an ejection fraction of 60-65% and grade 1 diastolic dysfunction. -Will update echo. -Continue Lasix 40 mg IV twice a day, she has diuresed about 1 L since yesterday, however remains over 6 L positive since admission. -Doubt this represents pneumonia.  Hypoglycemia -Resolved. -Likely due to glipizide plus insulin in the setting of acute renal failure. -Blood sugar stabilized off dextrose fluids. -Continue sliding scale, low-dose Lantus.  Acute renal failure -Creatinine down to 1.46  from a high of 1.8. -Discussed case with nephrology today, currently autoimmune and 24-hour urine protein collection is underway. They would like to keep patient in the hospital today as pending results of workup mainly renal biopsy.  Hypokalemia -Replaced  Hypertension -Fair control on current medications.  History of CVA -With residual right-sided weakness, at baseline.   DVT prophylaxis:  Subcutaneous heparin Code Status: Code Family Communication: Patient only Disposition Plan: Anticipate discharge home in 24-48 hours after resolution of pulmonary edema.  Consultants:   Nephrology  Procedures:   None  Antimicrobials:  Anti-infectives    None       Subjective: Has no complaints today, is anxious for discharge homeIs using oxygen.  Objective: Vitals:   05/05/17 2029 05/05/17 2049 05/06/17 0447 05/06/17 0908  BP: (!) 185/65  (!) 218/81 (!) 176/91  Pulse: 80  85   Resp: 20  20   Temp: 98.4 F (36.9 C)  97.9 F (36.6 C)   TempSrc: Oral  Oral   SpO2: 95% (!) 85% 95%   Weight:      Height:        Intake/Output Summary (Last 24 hours) at 05/06/17 1517 Last data filed at 05/06/17 0900  Gross per 24 hour  Intake          3152.08 ml  Output             1000 ml  Net          2152.08 ml   Filed Weights   04/30/17 1704 05/01/17 0956 05/02/17 0500  Weight: 89.4 kg (197 lb) 90.5 kg (199 lb 8.3 oz) 94.3 kg (207 lb 14.3 oz)    Examination:  General exam: Alert, awake, oriented x 3 Respiratory system: Mild bibasilar crackles Cardiovascular system:RRR. No murmurs, rubs, gallops. Gastrointestinal system: Abdomen is nondistended, soft and nontender. No organomegaly or masses felt. Normal bowel sounds heard. Central nervous system: Alert and oriented. No focal neurological deficits. Extremities: No C/C/E, +pedal pulses Skin: No rashes, lesions or ulcers Psychiatry: Judgement and  insight appear normal. Mood & affect appropriate.       Data Reviewed: I have personally reviewed following labs and imaging studies  CBC:  Recent Labs Lab 04/30/17 1740 05/01/17 0624  WBC 6.7 8.5  NEUTROABS 4.8  --   HGB 15.0 14.3  HCT 45.5 44.5  MCV 87.8 88.6  PLT 202 756   Basic Metabolic Panel:  Recent Labs Lab 04/30/17 1740  05/02/17 0550 05/03/17 0558 05/04/17 1419 05/05/17 0655 05/06/17 0622  NA 138  < > 137 134* 136 136 136  K 2.8*  < > 4.0 3.9 4.4  4.2 4.2  CL 104  < > 107 104 107 106 108  CO2 23  < > 24 22 23 23  21*  GLUCOSE 41*  < > 196* 178* 204* 160* 157*  BUN 30*  < > 29* 34* 40* 39* 38*  CREATININE 1.57*  < > 1.68* 1.74* 1.83* 1.57* 1.46*  CALCIUM 9.0  < > 8.3* 8.6* 8.7* 8.8* 8.9  MG 2.3  --   --   --   --   --   --   PHOS  --   --   --   --   --  4.2  --   < > = values in this interval not displayed. GFR: Estimated Creatinine Clearance: 45.6 mL/min (A) (by C-G formula based on SCr of 1.46 mg/dL (H)). Liver Function Tests:  Recent Labs Lab 04/30/17 1740 05/04/17 1419 05/05/17 0655  AST 30 23  --   ALT 14 16  --   ALKPHOS 49 39  --   BILITOT 1.2 1.0  --   PROT 8.1 6.7  --   ALBUMIN 3.8 3.1* 3.1*   No results for input(s): LIPASE, AMYLASE in the last 168 hours. No results for input(s): AMMONIA in the last 168 hours. Coagulation Profile: No results for input(s): INR, PROTIME in the last 168 hours. Cardiac Enzymes:  Recent Labs Lab 04/30/17 1740  TROPONINI 0.04*   BNP (last 3 results) No results for input(s): PROBNP in the last 8760 hours. HbA1C: No results for input(s): HGBA1C in the last 72 hours. CBG:  Recent Labs Lab 05/05/17 1140 05/05/17 1646 05/05/17 2028 05/06/17 0735 05/06/17 1126  GLUCAP 170* 161* 181* 156* 184*   Lipid Profile: No results for input(s): CHOL, HDL, LDLCALC, TRIG, CHOLHDL, LDLDIRECT in the last 72 hours. Thyroid Function Tests: No results for input(s): TSH, T4TOTAL, FREET4, T3FREE, THYROIDAB in the last 72 hours. Anemia Panel: No results for input(s): VITAMINB12, FOLATE, FERRITIN, TIBC, IRON, RETICCTPCT in the last 72 hours. Urine analysis:    Component Value Date/Time   COLORURINE AMBER (A) 04/30/2017 1746   APPEARANCEUR CLEAR 04/30/2017 1746   LABSPEC 1.011 04/30/2017 1746   PHURINE 6.0 04/30/2017 1746   GLUCOSEU NEGATIVE 04/30/2017 1746   HGBUR NEGATIVE 04/30/2017 1746   BILIRUBINUR NEGATIVE 04/30/2017 1746   KETONESUR NEGATIVE 04/30/2017 1746   PROTEINUR >=300  (A) 04/30/2017 1746   UROBILINOGEN 0.2 08/03/2013 0936   NITRITE POSITIVE (A) 04/30/2017 1746   LEUKOCYTESUR NEGATIVE 04/30/2017 1746   Sepsis Labs: @LABRCNTIP (procalcitonin:4,lacticidven:4)  ) Recent Results (from the past 240 hour(s))  MRSA PCR Screening     Status: None   Collection Time: 05/01/17 10:00 AM  Result Value Ref Range Status   MRSA by PCR NEGATIVE NEGATIVE Final    Comment:        The GeneXpert MRSA Assay (FDA approved for NASAL specimens only), is one component of a comprehensive MRSA colonization  surveillance program. It is not intended to diagnose MRSA infection nor to guide or monitor treatment for MRSA infections.          Radiology Studies: Dg Chest Port 1 View  Result Date: 05/06/2017 CLINICAL DATA:  Shortness of breath, history of diabetes, hypertension, acute renal injury, former smoker. EXAM: PORTABLE CHEST 1 VIEW COMPARISON:  PA and lateral chest x-ray of April 30, 2017 FINDINGS: The lungs are well-expanded. The interstitial markings are coarse and more conspicuous today. There is no pleural effusion or pneumothorax. The cardiac silhouette is enlarged. The central pulmonary vascularity is prominent but stable. There is no definite cephalization. The bony thorax exhibits no acute abnormality. IMPRESSION: Chronic cardiomegaly without significant pulmonary vascular congestion. Increased prominence of the pulmonary interstitium may reflect mild interstitial edema or less likely pneumonia. Thoracic aortic atherosclerosis. A PA and lateral chest x-ray would be useful. Electronically Signed   By: David  Martinique M.D.   On: 05/06/2017 08:03        Scheduled Meds: . amLODipine  10 mg Oral Daily  . aspirin  325 mg Oral Daily  . carvedilol  6.25 mg Oral BID WC  . furosemide  40 mg Intravenous BID  . gabapentin  200 mg Oral TID  . heparin  5,000 Units Subcutaneous Q8H  . hydrALAZINE  100 mg Oral Q8H  . insulin aspart  0-9 Units Subcutaneous TID WC  .  insulin glargine  8 Units Subcutaneous Daily  . living well with diabetes book   Does not apply Once  . nystatin   Topical BID  . potassium chloride  20 mEq Oral Daily   Continuous Infusions: . sodium chloride 10 mL/hr at 05/06/17 0500     LOS: 4 days    Time spent: 35 minutes. Greater than 50% of this time was spent in direct contact with the patient coordinating care.     Lelon Frohlich, MD [facility] Pager [phone]  If 7PM-7AM, please contact night-coverage [email or web address] Password [facility] 05/06/2017, 3:17 PM

## 2017-05-07 ENCOUNTER — Inpatient Hospital Stay (HOSPITAL_COMMUNITY): Payer: Medicare Other

## 2017-05-07 DIAGNOSIS — I340 Nonrheumatic mitral (valve) insufficiency: Secondary | ICD-10-CM

## 2017-05-07 LAB — RENAL FUNCTION PANEL
ANION GAP: 8 (ref 5–15)
Albumin: 3.1 g/dL — ABNORMAL LOW (ref 3.5–5.0)
BUN: 40 mg/dL — ABNORMAL HIGH (ref 6–20)
CHLORIDE: 106 mmol/L (ref 101–111)
CO2: 24 mmol/L (ref 22–32)
Calcium: 9 mg/dL (ref 8.9–10.3)
Creatinine, Ser: 1.69 mg/dL — ABNORMAL HIGH (ref 0.44–1.00)
GFR calc Af Amer: 36 mL/min — ABNORMAL LOW (ref >60)
GFR calc non Af Amer: 31 mL/min — ABNORMAL LOW (ref >60)
Glucose, Bld: 149 mg/dL — ABNORMAL HIGH (ref 65–99)
Phosphorus: 4.7 mg/dL — ABNORMAL HIGH (ref 2.5–4.6)
Potassium: 3.9 mmol/L (ref 3.5–5.1)
SODIUM: 138 mmol/L (ref 135–145)

## 2017-05-07 LAB — ECHOCARDIOGRAM COMPLETE
Height: 66 in
WEIGHTICAEL: 3326.3 [oz_av]

## 2017-05-07 LAB — GLUCOSE, CAPILLARY
GLUCOSE-CAPILLARY: 174 mg/dL — AB (ref 65–99)
Glucose-Capillary: 219 mg/dL — ABNORMAL HIGH (ref 65–99)
Glucose-Capillary: 205 mg/dL — ABNORMAL HIGH (ref 65–99)
Glucose-Capillary: 249 mg/dL — ABNORMAL HIGH (ref 65–99)

## 2017-05-07 LAB — ANTI-DNA ANTIBODY, DOUBLE-STRANDED: ds DNA Ab: 1 IU/mL (ref 0–9)

## 2017-05-07 LAB — CBC
HCT: 40.5 % (ref 36.0–46.0)
HEMOGLOBIN: 13.1 g/dL (ref 12.0–15.0)
MCH: 28.7 pg (ref 26.0–34.0)
MCHC: 32.3 g/dL (ref 30.0–36.0)
MCV: 88.8 fL (ref 78.0–100.0)
Platelets: 190 10*3/uL (ref 150–400)
RBC: 4.56 MIL/uL (ref 3.87–5.11)
RDW: 16.8 % — ABNORMAL HIGH (ref 11.5–15.5)
WBC: 5.9 10*3/uL (ref 4.0–10.5)

## 2017-05-07 LAB — ANTI-SMITH ANTIBODY

## 2017-05-07 LAB — HIV ANTIBODY (ROUTINE TESTING W REFLEX): HIV SCREEN 4TH GENERATION: NONREACTIVE

## 2017-05-07 LAB — HEMOGLOBIN A1C
Hgb A1c MFr Bld: 5.6 % (ref 4.8–5.6)
Mean Plasma Glucose: 114 mg/dL

## 2017-05-07 MED ORDER — TORSEMIDE 20 MG PO TABS
20.0000 mg | ORAL_TABLET | Freq: Every day | ORAL | Status: DC
  Administered 2017-05-07 – 2017-05-08 (×2): 20 mg via ORAL
  Filled 2017-05-07 (×2): qty 1

## 2017-05-07 MED ORDER — DOXAZOSIN MESYLATE 2 MG PO TABS
4.0000 mg | ORAL_TABLET | Freq: Every day | ORAL | Status: DC
  Administered 2017-05-07 – 2017-05-08 (×2): 4 mg via ORAL
  Filled 2017-05-07 (×2): qty 2

## 2017-05-07 NOTE — Care Management Important Message (Signed)
Important Message  Patient Details  Name: Madison Reed MRN: [id number] Date of Birth: [birth year]   Medicare Important Message Given:  Yes    Sherald Barge, RN 05/07/2017, 1:24 PM

## 2017-05-07 NOTE — Progress Notes (Signed)
Subjective: Interval History: Patient is feeling much better. She denies any difficulty breathing. Her appetite is good..  Objective: Vital signs in last 24 hours: Temp:  [97.7 F (36.5 C)-97.8 F (36.6 C)] 97.7 F (36.5 C) (07/26 2017) Pulse Rate:  [80-83] 83 (07/27 0428) Resp:  [20] 20 (07/27 0428) BP: (176-194)/(68-91) 194/73 (07/27 0428) SpO2:  [90 %-94 %] 93 % (07/27 0428) Weight change:   Intake/Output from previous day: 07/26 0701 - 07/27 0700 In: 720 [P.O.:720] Out: 4300 [Urine:4300] Intake/Output this shift: No intake/output data recorded.  General appearance: alert, cooperative and no distress Resp: diminished breath sounds bilaterally Cardio: regular rate and rhythm Extremities: edema She has trace to 1+ edema  Lab Results:  Recent Labs  05/07/17 0415  WBC 5.9  HGB 13.1  HCT 40.5  PLT 190   BMET:   Recent Labs  05/06/17 0622 05/07/17 0415  NA 136 138  K 4.2 3.9  CL 108 106  CO2 21* 24  GLUCOSE 157* 149*  BUN 38* 40*  CREATININE 1.46* 1.69*  CALCIUM 8.9 9.0   No results for input(s): PTH in the last 72 hours. Iron Studies: No results for input(s): IRON, TIBC, TRANSFERRIN, FERRITIN in the last 72 hours.  Studies/Results: Dg Chest Port 1 View  Result Date: 05/06/2017 CLINICAL DATA:  Shortness of breath, history of diabetes, hypertension, acute renal injury, former smoker. EXAM: PORTABLE CHEST 1 VIEW COMPARISON:  PA and lateral chest x-ray of April 30, 2017 FINDINGS: The lungs are well-expanded. The interstitial markings are coarse and more conspicuous today. There is no pleural effusion or pneumothorax. The cardiac silhouette is enlarged. The central pulmonary vascularity is prominent but stable. There is no definite cephalization. The bony thorax exhibits no acute abnormality. IMPRESSION: Chronic cardiomegaly without significant pulmonary vascular congestion. Increased prominence of the pulmonary interstitium may reflect mild interstitial edema or  less likely pneumonia. Thoracic aortic atherosclerosis. A PA and lateral chest x-ray would be useful. Electronically Signed   By: David  Martinique M.D.   On: 05/06/2017 08:03    I have reviewed the patient's current medications.  Assessment/Plan: Problem #1 renal failure: Possibly acute on chronic. Her creatinine is slightly high today possibly from fluid removal. Problem #2 proteinuria: Non-nephrotic range. She has 2100 milligrams of proteinuria over 24 hours. Etiology could be secondary to diabetes/hypertension/obesity related glomerulopathy/since she has positive ANA and low C4 need to rule out lupus nephritis. Problem #3 difficulty breathing: Patient was given IV Lasix and she had 4300 mL of urine output and feeling much better. Problem #4 history of diabetes Problem #5 hypertension: Patient is on carvedilol/amlodipine/hydralazine and her blood pressure is still high. Problem #6 history of CVA. Problem #7 bone mineral disorder: Calcium in the range Plan: 1]We'll continue to advise patient to decrease her salt and fluid intake 2] we'll start her on Demadex 20 mg once a day 3] we'll check her renal panel in the morning 4] we will add Cardura 4 mg by mouth once a day.     LOS: 5 days   Airyn Ellzey S 05/07/2017,8:47 AM

## 2017-05-07 NOTE — Progress Notes (Signed)
*  PRELIMINARY RESULTS* Echocardiogram 2D Echocardiogram has been performed.  Leavy Cella 05/07/2017, 10:49 AM

## 2017-05-07 NOTE — Care Management Note (Signed)
Case Management Note  Patient Details  Name: Madison Reed MRN: [id number] Date of Birth: [birth year]   Expected Discharge Date:  05/06/17               Expected Discharge Plan:  [place]  In-House Referral:  NA  Discharge planning Services  CM Consult  Post Acute Care Choice:  Home Health, Resumption of Svcs/PTA Provider Choice offered to:  Patient  HH Arranged:    RN/PT [place] Agency:  [place]  Status of Service:  Completed, signed off  Additional Comments: [place] home over weekend. Pt aware HH has 48hrs to make first visit. Brad, [facility] rep, aware of potential DC home today. Pt will wean from oxygen prior to DC. Echo pending.   Sherald Barge, RN 05/07/2017, 1:22 PM

## 2017-05-07 NOTE — Progress Notes (Signed)
PROGRESS NOTE    Madison Reed  EXH:[id number] DOB: 1951/07/03 DOA: 04/30/2017 PCP: Celene Squibb, MD     Brief Narrative:  66 year old woman admitted to the hospital on 7/20 for symptomatic hypoglycemia. She was also found to have new acute renal failure with a creatinine of 1.6. Her hypoglycemia has resolved and she is now off dextrose-containing fluids. Nephrology is on board and is working up her acute renal failure, autoimmune workup is in process. Overnight became short of breath and has a new oxygen requirement. Chest x-ray seems to be consistent with mild pulmonary edema, less likely pneumonia.   Assessment & Plan:   Principal Problem:   Hypoglycemia due to insulin Active Problems:   Hypertension   Neuropathy   Hypokalemia   AKI (acute kidney injury) ([place])   Hypoglycemia   Acute hypoxemic respiratory failure/acute on chronic diastolic CHF -Patient has been receiving high-dose IV fluids for treatment of renal failure, suspect this has thrown her into pulmonary edema/CHF. -Echo from 2014 with an ejection fraction of 60-65% and grade 1 diastolic dysfunction. -Will update echo. -Treated with Lasix 40 mg IV twice a day, now transitioned to oral torsemide. Recheck renal function tomorrow. Remains hypoxic. -Doubt this represents pneumonia.  Hypoglycemia -Resolved. -Likely due to glipizide plus insulin in the setting of acute renal failure. -Blood sugar stabilized off dextrose fluids. -Continue sliding scale, low-dose Lantus.  Acute renal failure -Creatinine down to 1.46  from a high of 1.8. -Discussed case with nephrology, currently autoimmune and 24-hour urine protein collection is underway.   Hypokalemia -Replaced  Hypertension -Fair control on current medications.  History of CVA -With residual right-sided weakness, at baseline.   DVT prophylaxis: Subcutaneous heparin Code Status: Code Family Communication: Patient only Disposition Plan: Anticipate discharge  home in 24-48 hours after resolution of pulmonary edema.  Consultants:   Nephrology  Procedures:   None  Antimicrobials:  Anti-infectives    None       Subjective: Still has shortness of breath as well as fatigue. No nausea no vomiting. Anxious to be discharged.  Objective: Vitals:   05/06/17 1300 05/06/17 2017 05/06/17 2050 05/07/17 0428  BP: (!) 182/68 (!) 176/77  (!) 194/73  Pulse: 81 80  83  Resp: 20 20  20   Temp: 97.8 F (36.6 C) 97.7 F (36.5 C)    TempSrc: Oral Oral    SpO2: 94% 90% 93% 93%  Weight:      Height:        Intake/Output Summary (Last 24 hours) at 05/07/17 1301 Last data filed at 05/07/17 1050  Gross per 24 hour  Intake              480 ml  Output             4200 ml  Net            -3720 ml   Filed Weights   04/30/17 1704 05/01/17 0956 05/02/17 0500  Weight: 89.4 kg (197 lb) 90.5 kg (199 lb 8.3 oz) 94.3 kg (207 lb 14.3 oz)    Examination:  General exam: Alert, awake, oriented x 3 Respiratory system: bibasilar crackles Cardiovascular system:RRR. No murmurs, rubs, gallops. Gastrointestinal system: Abdomen is nondistended, soft and nontender. No organomegaly or masses felt. Normal bowel sounds heard. Central nervous system: Alert and oriented. No focal neurological deficits. Extremities: Bilateral edema +pedal pulses Skin: No rashes, lesions or ulcers Psychiatry: Judgement and insight appear normal. Mood & affect appropriate.  Data Reviewed: I have personally reviewed following labs and imaging studies  CBC:  Recent Labs Lab 04/30/17 1740 05/01/17 0624 05/07/17 0415  WBC 6.7 8.5 5.9  NEUTROABS 4.8  --   --   HGB 15.0 14.3 13.1  HCT 45.5 44.5 40.5  MCV 87.8 88.6 88.8  PLT 202 208 462   Basic Metabolic Panel:  Recent Labs Lab 04/30/17 1740  05/03/17 0558 05/04/17 1419 05/05/17 0655 05/06/17 0622 05/07/17 0415  NA 138  < > 134* 136 136 136 138  K 2.8*  < > 3.9 4.4 4.2 4.2 3.9  CL 104  < > 104 107 106 108 106    CO2 23  < > 22 23 23  21* 24  GLUCOSE 41*  < > 178* 204* 160* 157* 149*  BUN 30*  < > 34* 40* 39* 38* 40*  CREATININE 1.57*  < > 1.74* 1.83* 1.57* 1.46* 1.69*  CALCIUM 9.0  < > 8.6* 8.7* 8.8* 8.9 9.0  MG 2.3  --   --   --   --   --   --   PHOS  --   --   --   --  4.2  --  4.7*  < > = values in this interval not displayed. GFR: Estimated Creatinine Clearance: 39.4 mL/min (A) (by C-G formula based on SCr of 1.69 mg/dL (H)). Liver Function Tests:  Recent Labs Lab 04/30/17 1740 05/04/17 1419 05/05/17 0655 05/07/17 0415  AST 30 23  --   --   ALT 14 16  --   --   ALKPHOS 49 39  --   --   BILITOT 1.2 1.0  --   --   PROT 8.1 6.7  --   --   ALBUMIN 3.8 3.1* 3.1* 3.1*   No results for input(s): LIPASE, AMYLASE in the last 168 hours. No results for input(s): AMMONIA in the last 168 hours. Coagulation Profile: No results for input(s): INR, PROTIME in the last 168 hours. Cardiac Enzymes:  Recent Labs Lab 04/30/17 1740  TROPONINI 0.04*   BNP (last 3 results) No results for input(s): PROBNP in the last 8760 hours. HbA1C: No results for input(s): HGBA1C in the last 72 hours. CBG:  Recent Labs Lab 05/06/17 1126 05/06/17 1609 05/06/17 2017 05/07/17 0811 05/07/17 1107  GLUCAP 184* 183* 223* 174* 219*   Lipid Profile: No results for input(s): CHOL, HDL, LDLCALC, TRIG, CHOLHDL, LDLDIRECT in the last 72 hours. Thyroid Function Tests: No results for input(s): TSH, T4TOTAL, FREET4, T3FREE, THYROIDAB in the last 72 hours. Anemia Panel: No results for input(s): VITAMINB12, FOLATE, FERRITIN, TIBC, IRON, RETICCTPCT in the last 72 hours. Urine analysis:    Component Value Date/Time   COLORURINE AMBER (A) 04/30/2017 1746   APPEARANCEUR CLEAR 04/30/2017 1746   LABSPEC 1.011 04/30/2017 1746   PHURINE 6.0 04/30/2017 1746   GLUCOSEU NEGATIVE 04/30/2017 1746   HGBUR NEGATIVE 04/30/2017 1746   BILIRUBINUR NEGATIVE 04/30/2017 1746   KETONESUR NEGATIVE 04/30/2017 1746   PROTEINUR >=300  (A) 04/30/2017 1746   UROBILINOGEN 0.2 08/03/2013 0936   NITRITE POSITIVE (A) 04/30/2017 1746   LEUKOCYTESUR NEGATIVE 04/30/2017 1746   Sepsis Labs: @LABRCNTIP (procalcitonin:4,lacticidven:4)  ) Recent Results (from the past 240 hour(s))  MRSA PCR Screening     Status: None   Collection Time: 05/01/17 10:00 AM  Result Value Ref Range Status   MRSA by PCR NEGATIVE NEGATIVE Final    Comment:        The GeneXpert MRSA Assay (  FDA approved for NASAL specimens only), is one component of a comprehensive MRSA colonization surveillance program. It is not intended to diagnose MRSA infection nor to guide or monitor treatment for MRSA infections.          Radiology Studies: Dg Chest Port 1 View  Result Date: 05/06/2017 CLINICAL DATA:  Shortness of breath, history of diabetes, hypertension, acute renal injury, former smoker. EXAM: PORTABLE CHEST 1 VIEW COMPARISON:  PA and lateral chest x-ray of April 30, 2017 FINDINGS: The lungs are well-expanded. The interstitial markings are coarse and more conspicuous today. There is no pleural effusion or pneumothorax. The cardiac silhouette is enlarged. The central pulmonary vascularity is prominent but stable. There is no definite cephalization. The bony thorax exhibits no acute abnormality. IMPRESSION: Chronic cardiomegaly without significant pulmonary vascular congestion. Increased prominence of the pulmonary interstitium may reflect mild interstitial edema or less likely pneumonia. Thoracic aortic atherosclerosis. A PA and lateral chest x-ray would be useful. Electronically Signed   By: David  Martinique M.D.   On: 05/06/2017 08:03        Scheduled Meds: . amLODipine  10 mg Oral Daily  . aspirin  325 mg Oral Daily  . carvedilol  6.25 mg Oral BID WC  . doxazosin  4 mg Oral Daily  . gabapentin  200 mg Oral TID  . heparin  5,000 Units Subcutaneous Q8H  . hydrALAZINE  100 mg Oral Q8H  . insulin aspart  0-9 Units Subcutaneous TID WC  . insulin  glargine  8 Units Subcutaneous Daily  . living well with diabetes book   Does not apply Once  . nystatin   Topical BID  . potassium chloride  20 mEq Oral Daily  . torsemide  20 mg Oral Daily   Continuous Infusions: . sodium chloride 10 mL/hr at 05/06/17 0500     LOS: 5 days   Author:  Berle Mull, MD [facility] Pager: [phone] 05/07/2017 1:04 PM     If 7PM-7AM, please contact night-coverage [email or web address] Password [email or web address] 05/07/2017, 1:01 PM

## 2017-05-07 NOTE — Progress Notes (Signed)
Inpatient Diabetes Program Recommendations  AACE/ADA: New Consensus Statement on Inpatient Glycemic Control (2015)  Target Ranges:  Prepandial:   less than 140 mg/dL      Peak postprandial:   less than 180 mg/dL (1-2 hours)      Critically ill patients:  140 - 180 mg/dL   Results for NEVILLE, PAULS (MRN [id number]) as of 05/07/2017 07:41  Ref. Range 05/06/2017 07:35 05/06/2017 11:26 05/06/2017 16:09 05/06/2017 20:17  Glucose-Capillary Latest Ref Range: 65 - 99 mg/dL 156 (H) 184 (H) 183 (H) 223 (H)   Review of Glycemic Control Diabetes history: DM2 Outpatient Diabetes medications: Lantus 20 units QHS, Glipizide 10 mg BID, Januvia 100 mg daily Current orders for Inpatient glycemic control: Lantus 8 units daily, Novolog 0-9 units TID with meals  Inpatient Diabetes Program Recommendations: Correction (SSI): Please consider ordering Novolog 0-5 units QHS for bedtime correction scale. Insulin - Meal Coverage: Please consider ordering Novolog 3 units TID with meals for meal coverage if patient eats at least 50% of meals. Outpatient DM medication: At time of discharge, recommend discharging patient on similar DM medication regimen as being used as an inpatient and have patient follow up with PCP.  Thanks, Barnie Alderman, RN, MSN, CDE Diabetes Coordinator Inpatient Diabetes Program [phone] (Team Pager from 8am to 5pm)

## 2017-05-08 DIAGNOSIS — R0600 Dyspnea, unspecified: Secondary | ICD-10-CM

## 2017-05-08 DIAGNOSIS — R0602 Shortness of breath: Secondary | ICD-10-CM

## 2017-05-08 LAB — GLUCOSE, CAPILLARY
GLUCOSE-CAPILLARY: 202 mg/dL — AB (ref 65–99)
Glucose-Capillary: 164 mg/dL — ABNORMAL HIGH (ref 65–99)

## 2017-05-08 LAB — RENAL FUNCTION PANEL
Albumin: 3 g/dL — ABNORMAL LOW (ref 3.5–5.0)
Anion gap: 9 (ref 5–15)
BUN: 41 mg/dL — AB (ref 6–20)
CHLORIDE: 102 mmol/L (ref 101–111)
CO2: 26 mmol/L (ref 22–32)
CREATININE: 1.69 mg/dL — AB (ref 0.44–1.00)
Calcium: 8.9 mg/dL (ref 8.9–10.3)
GFR calc non Af Amer: 31 mL/min — ABNORMAL LOW (ref >60)
GFR, EST AFRICAN AMERICAN: 36 mL/min — AB (ref >60)
Glucose, Bld: 172 mg/dL — ABNORMAL HIGH (ref 65–99)
POTASSIUM: 3.6 mmol/L (ref 3.5–5.1)
Phosphorus: 4.8 mg/dL — ABNORMAL HIGH (ref 2.5–4.6)
Sodium: 137 mmol/L (ref 135–145)

## 2017-05-08 LAB — CBC
HCT: 36.9 % (ref 36.0–46.0)
Hemoglobin: 11.9 g/dL — ABNORMAL LOW (ref 12.0–15.0)
MCH: 28.8 pg (ref 26.0–34.0)
MCHC: 32.2 g/dL (ref 30.0–36.0)
MCV: 89.3 fL (ref 78.0–100.0)
PLATELETS: 174 10*3/uL (ref 150–400)
RBC: 4.13 MIL/uL (ref 3.87–5.11)
RDW: 16.7 % — AB (ref 11.5–15.5)
WBC: 5.5 10*3/uL (ref 4.0–10.5)

## 2017-05-08 MED ORDER — INSULIN GLARGINE 100 UNIT/ML ~~LOC~~ SOLN: 12.0000 [IU] | Freq: Every day | SUBCUTANEOUS | 11 refills | Status: DC

## 2017-05-08 MED ORDER — TORSEMIDE 20 MG PO TABS: 20.0000 mg | ORAL_TABLET | Freq: Every day | ORAL | 1 refills | Status: DC

## 2017-05-08 MED ORDER — HYDRALAZINE HCL 100 MG PO TABS: 100.0000 mg | ORAL_TABLET | Freq: Three times a day (TID) | ORAL | 1 refills | Status: AC

## 2017-05-08 MED ORDER — GABAPENTIN 100 MG PO CAPS: 200.0000 mg | ORAL_CAPSULE | Freq: Three times a day (TID) | ORAL | 1 refills | Status: DC

## 2017-05-08 MED ORDER — DOXAZOSIN MESYLATE 4 MG PO TABS: 4.0000 mg | ORAL_TABLET | Freq: Every day | ORAL | 1 refills | Status: DC

## 2017-05-08 MED ORDER — INSULIN GLARGINE 100 UNIT/ML ~~LOC~~ SOLN: 8.0000 [IU] | Freq: Every day | SUBCUTANEOUS | 11 refills | Status: DC

## 2017-05-08 NOTE — Discharge Summary (Addendum)
Physician Discharge Summary  Madison Reed MRN: [id number] DOB/AGE: 64-Oct-1952 64 y.o.  PCP: Celene Squibb, MD   Admit date: 04/30/2017 Discharge date: 05/08/2017  Discharge Diagnoses:    Principal Problem:   Hypoglycemia due to insulin Active Problems:   Hypertension   Neuropathy   Hypokalemia   AKI (acute kidney injury) ([place])   Hypoglycemia   Dyspnea    Follow-up recommendations Follow-up with PCP in 3-5 days , including all  additional recommended appointments as below Follow-up CBC, CMP in 3-5 days Patient to follow-up with Dr. Fran Lowes, MD in 4 weeks      Current Discharge Medication List    START taking these medications   Details  carvedilol (COREG) 6.25 MG tablet Take 1 tablet (6.25 mg total) by mouth 2 (two) times daily with a meal. Qty: 60 tablet, Refills: 2    doxazosin (CARDURA) 4 MG tablet Take 1 tablet (4 mg total) by mouth daily. Qty: 30 tablet, Refills: 1    hydrALAZINE (APRESOLINE) 100 MG tablet Take 1 tablet (100 mg total) by mouth every 8 (eight) hours. Qty: 90 tablet, Refills: 1    torsemide (DEMADEX) 20 MG tablet Take 1 tablet (20 mg total) by mouth daily. Qty: 30 tablet, Refills: 1      CONTINUE these medications which have CHANGED   Details  aspirin 325 MG tablet Take 1 tablet (325 mg total) by mouth daily.    gabapentin (NEURONTIN) 100 MG capsule Take 2 capsules (200 mg total) by mouth 3 (three) times daily. Qty: 90 capsule, Refills: 1    insulin glargine (LANTUS) 100 UNIT/ML injection Inject 0.12 mLs (12 Units total) into the skin daily. Qty: 10 mL, Refills: 11      CONTINUE these medications which have NOT CHANGED   Details  amLODipine (NORVASC) 10 MG tablet Take 10 mg by mouth daily.    fenofibrate 160 MG tablet Take 160 mg by mouth daily.    fexofenadine (ALLEGRA) 180 MG tablet Take 180 mg by mouth daily.    sitaGLIPtin (JANUVIA) 100 MG tablet Take 100 mg by mouth daily.    blood glucose meter kit and supplies KIT  Dispense based on patient and insurance preference. Use up to four times daily as directed. (FOR ICD-9 250.00, 250.01). Qty: 1 each, Refills: 0      STOP taking these medications     glipiZIDE (GLUCOTROL) 10 MG tablet      losartan-hydrochlorothiazide (HYZAAR) 100-12.5 MG tablet      potassium chloride 20 MEQ TBCR          Discharge Condition: Stable Discharge Instructions Get Medicines reviewed and adjusted: Please take all your medications with you for your next visit with your Primary MD  Please request your Primary MD to go over all hospital tests and procedure/radiological results at the follow up, please ask your Primary MD to get all Hospital records sent to his/her office.  If you experience worsening of your admission symptoms, develop shortness of breath, life threatening emergency, suicidal or homicidal thoughts you must seek medical attention immediately by calling 911 or calling your MD immediately if symptoms less severe.  You must read complete instructions/literature along with all the possible adverse reactions/side effects for all the Medicines you take and that have been prescribed to you. Take any new Medicines after you have completely understood and accpet all the possible adverse reactions/side effects.   Do not drive when taking Pain medications.   Do not take more than prescribed  Pain, Sleep and Anxiety Medications  Special Instructions: If you have smoked or chewed Tobacco in the last 2 yrs please stop smoking, stop any regular Alcohol and or any Recreational drug use.  Wear Seat belts while driving.  Please note  You were cared for by a hospitalist during your hospital stay. Once you are discharged, your primary care physician will handle any further medical issues. Please note that NO REFILLS for any discharge medications will be authorized once you are discharged, as it is imperative that you return to your primary care physician (or establish a  relationship with a primary care physician if you do not have one) for your aftercare needs so that they can reassess your need for medications and monitor your lab values.  Discharge Instructions    Diet - low sodium heart healthy    Complete by:  As directed    Increase activity slowly    Complete by:  As directed        Allergies  Allergen Reactions  . Penicillins Itching    Has patient had a PCN reaction causing immediate rash, facial/tongue/throat swelling, SOB or lightheadedness with hypotension: Yes Has patient had a PCN reaction causing severe rash involving mucus membranes or skin necrosis: No Has patient had a PCN reaction that required hospitalization: No Has patient had a PCN reaction occurring within the last 10 years: No If all of the above answers are "NO", then may proceed with Cephalosporin use.   . Clonidine Derivatives Other (See Comments)    Numbness  to tongue face, and arm  . Codeine Itching  . Hydrocodone-Acetaminophen Itching  . Shellfish Allergy Itching  . Sulfa Antibiotics Itching and Rash      Disposition: 01-Home or Self Care   Consults:  Nephrology   Significant Diagnostic Studies:  Dg Chest 2 View  Result Date: 04/30/2017 CLINICAL DATA:  Weakness with hypoglycemia EXAM: CHEST  2 VIEW COMPARISON:  03/24/2015 FINDINGS: Cardiomegaly with aortic atherosclerosis. No thoracic aortic aneurysm. No acute pulmonary consolidation, effusion or pneumothorax. No acute nor suspicious osseous lesions. IMPRESSION: Stable cardiomegaly without acute pulmonary disease. Aortic atherosclerosis without aneurysm. Electronically Signed   By: Ashley Royalty M.D.   On: 04/30/2017 19:42   US Renal  Result Date: 05/02/2017 CLINICAL DATA:  Acute kidney injury. EXAM: RENAL / URINARY TRACT ULTRASOUND COMPLETE COMPARISON:  CT 03/17/2012 FINDINGS: Right Kidney: Length: 11.9 cm. Echogenicity within normal limits. No mass or hydronephrosis visualized. Previous right renal stone is  not seen, may have passed in the interim or not well visualized sonographically. Left Kidney: Length: 13.1 cm. Echogenicity within normal limits. No mass or hydronephrosis visualized. Previous left renal stones are not seen, may have passed in the interim or not visualize sonographically. Bladder: Appears normal for degree of bladder distention. Neither ureteral jet was visualized. IMPRESSION: No hydronephrosis or obstructive uropathy. Unremarkable renal ultrasound. Electronically Signed   By: Jeb Levering M.D.   On: 05/02/2017 22:35   Dg Chest Port 1 View  Result Date: 05/06/2017 CLINICAL DATA:  Shortness of breath, history of diabetes, hypertension, acute renal injury, former smoker. EXAM: PORTABLE CHEST 1 VIEW COMPARISON:  PA and lateral chest x-ray of April 30, 2017 FINDINGS: The lungs are well-expanded. The interstitial markings are coarse and more conspicuous today. There is no pleural effusion or pneumothorax. The cardiac silhouette is enlarged. The central pulmonary vascularity is prominent but stable. There is no definite cephalization. The bony thorax exhibits no acute abnormality. IMPRESSION: Chronic cardiomegaly without significant pulmonary  vascular congestion. Increased prominence of the pulmonary interstitium may reflect mild interstitial edema or less likely pneumonia. Thoracic aortic atherosclerosis. A PA and lateral chest x-ray would be useful. Electronically Signed   By: David  Martinique M.D.   On: 05/06/2017 08:03       Filed Weights   04/30/17 1704 05/01/17 0956 05/02/17 0500  Weight: 89.4 kg (197 lb) 90.5 kg (199 lb 8.3 oz) 94.3 kg (207 lb 14.3 oz)     Microbiology: Recent Results (from the past 240 hour(s))  MRSA PCR Screening     Status: None   Collection Time: 05/01/17 10:00 AM  Result Value Ref Range Status   MRSA by PCR NEGATIVE NEGATIVE Final    Comment:        The GeneXpert MRSA Assay (FDA approved for NASAL specimens only), is one component of a comprehensive  MRSA colonization surveillance program. It is not intended to diagnose MRSA infection nor to guide or monitor treatment for MRSA infections.        Blood Culture    Component Value Date/Time   SDES WOUND LEFT GREAT TOE 05/23/2015 1250   SPECREQUEST NONE 05/23/2015 1250   CULT  05/23/2015 1250    MULTIPLE ORGANISMS PRESENT, NONE PREDOMINANT Note: NO STAPHYLOCOCCUS AUREUS ISOLATED NO GROUP A STREP (S.PYOGENES) ISOLATED Performed at [facility] 05/27/2015 FINAL 05/23/2015 1250      Labs: Results for orders placed or performed during the hospital encounter of 04/30/17 (from the past 48 hour(s))  Glucose, capillary     Status: Abnormal   Collection Time: 05/06/17 11:26 AM  Result Value Ref Range   Glucose-Capillary 184 (H) 65 - 99 mg/dL   Comment 1 Notify RN    Comment 2 Document in Chart   Anti-DNA antibody, double-stranded     Status: None   Collection Time: 05/06/17  1:41 PM  Result Value Ref Range   ds DNA Ab 1 0 - 9 IU/mL    Comment: (NOTE)                                   Negative      <5                                   Equivocal  5 - 9                                   Positive      >9 Performed At: [facility] [place], [place] [id number] Lindon Romp MD [id number]   Anti-Smith antibody     Status: None   Collection Time: 05/06/17  1:41 PM  Result Value Ref Range   ENA SM Ab Ser-aCnc <0.2 0.0 - 0.9 AI    Comment: (NOTE) Performed At: [facility] [address] [place], [place] [id number] Lindon Romp MD [id number]   Glucose, capillary     Status: Abnormal   Collection Time: 05/06/17  4:09 PM  Result Value Ref Range   Glucose-Capillary 183 (H) 65 - 99 mg/dL   Comment 1 Notify RN    Comment 2 Document in Chart   Glucose, capillary     Status: Abnormal   Collection Time: 05/06/17  8:17 PM  Result Value Ref Range   Glucose-Capillary 223 (H) 65 - 99 mg/dL  Renal function panel      Status: Abnormal   Collection Time: 05/07/17  4:15 AM  Result Value Ref Range   Sodium 138 135 - 145 mmol/L   Potassium 3.9 3.5 - 5.1 mmol/L   Chloride 106 101 - 111 mmol/L   CO2 24 22 - 32 mmol/L   Glucose, Bld 149 (H) 65 - 99 mg/dL   BUN 40 (H) 6 - 20 mg/dL   Creatinine, Ser 1.69 (H) 0.44 - 1.00 mg/dL   Calcium 9.0 8.9 - 10.3 mg/dL   Phosphorus 4.7 (H) 2.5 - 4.6 mg/dL   Albumin 3.1 (L) 3.5 - 5.0 g/dL   GFR calc non Af Amer 31 (L) >60 mL/min   GFR calc Af Amer 36 (L) >60 mL/min    Comment: (NOTE) The eGFR has been calculated using the CKD EPI equation. This calculation has not been validated in all clinical situations. eGFR's persistently <60 mL/min signify possible Chronic Kidney Disease.    Anion gap 8 5 - 15  CBC     Status: Abnormal   Collection Time: 05/07/17  4:15 AM  Result Value Ref Range   WBC 5.9 4.0 - 10.5 K/uL   RBC 4.56 3.87 - 5.11 MIL/uL   Hemoglobin 13.1 12.0 - 15.0 g/dL   HCT 40.5 36.0 - 46.0 %   MCV 88.8 78.0 - 100.0 fL   MCH 28.7 26.0 - 34.0 pg   MCHC 32.3 30.0 - 36.0 g/dL   RDW 16.8 (H) 11.5 - 15.5 %   Platelets 190 150 - 400 K/uL  Glucose, capillary     Status: Abnormal   Collection Time: 05/07/17  8:11 AM  Result Value Ref Range   Glucose-Capillary 174 (H) 65 - 99 mg/dL  Glucose, capillary     Status: Abnormal   Collection Time: 05/07/17 11:07 AM  Result Value Ref Range   Glucose-Capillary 219 (H) 65 - 99 mg/dL  Glucose, capillary     Status: Abnormal   Collection Time: 05/07/17  4:02 PM  Result Value Ref Range   Glucose-Capillary 205 (H) 65 - 99 mg/dL   Comment 1 Notify RN    Comment 2 Document in Chart   Glucose, capillary     Status: Abnormal   Collection Time: 05/07/17  9:12 PM  Result Value Ref Range   Glucose-Capillary 249 (H) 65 - 99 mg/dL   Comment 1 Notify RN    Comment 2 Document in Chart   CBC     Status: Abnormal   Collection Time: 05/08/17  5:59 AM  Result Value Ref Range   WBC 5.5 4.0 - 10.5 K/uL   RBC 4.13 3.87 - 5.11  MIL/uL   Hemoglobin 11.9 (L) 12.0 - 15.0 g/dL   HCT 36.9 36.0 - 46.0 %   MCV 89.3 78.0 - 100.0 fL   MCH 28.8 26.0 - 34.0 pg   MCHC 32.2 30.0 - 36.0 g/dL   RDW 16.7 (H) 11.5 - 15.5 %   Platelets 174 150 - 400 K/uL  Renal function panel     Status: Abnormal   Collection Time: 05/08/17  5:59 AM  Result Value Ref Range   Sodium 137 135 - 145 mmol/L   Potassium 3.6 3.5 - 5.1 mmol/L   Chloride 102 101 - 111 mmol/L   CO2 26 22 - 32 mmol/L   Glucose, Bld 172 (H) 65 - 99 mg/dL   BUN 41 (  H) 6 - 20 mg/dL   Creatinine, Ser 1.69 (H) 0.44 - 1.00 mg/dL   Calcium 8.9 8.9 - 10.3 mg/dL   Phosphorus 4.8 (H) 2.5 - 4.6 mg/dL   Albumin 3.0 (L) 3.5 - 5.0 g/dL   GFR calc non Af Amer 31 (L) >60 mL/min   GFR calc Af Amer 36 (L) >60 mL/min    Comment: (NOTE) The eGFR has been calculated using the CKD EPI equation. This calculation has not been validated in all clinical situations. eGFR's persistently <60 mL/min signify possible Chronic Kidney Disease.    Anion gap 9 5 - 15  Glucose, capillary     Status: Abnormal   Collection Time: 05/08/17  7:50 AM  Result Value Ref Range   Glucose-Capillary 164 (H) 65 - 99 mg/dL   Comment 1 Notify RN    Comment 2 Document in Chart      Lipid Panel     Component Value Date/Time   CHOL 179 08/03/2013 1507   TRIG 344 (H) 08/03/2013 1507   HDL 29 (L) 08/03/2013 1507   CHOLHDL 6.2 08/03/2013 1507   VLDL 69 (H) 08/03/2013 1507   LDLCALC 81 08/03/2013 1507     Lab Results  Component Value Date   HGBA1C 5.6 05/01/2017   HGBA1C 10.1 (H) 03/21/2015   HGBA1C 9.4 (H) 08/03/2013       HPI :*  66 year old woman admitted to the hospital on 7/20 for symptomatic hypoglycemia. She was also found to have new acute renal failure with a creatinine of 1.6. Her hypoglycemia has resolved and she is now off dextrose-containing fluids. Nephrology is on board and is working up her acute renal failure, autoimmune workup is in process. Overnight became short of breath and has  a new oxygen requirement. Chest x-ray seems to be consistent with mild pulmonary edema, less likely pneumonia.   HOSPITAL COURSE:  Acute hypoxemic respiratory failure/acute on chronic diastolic CHF -Patient has been receiving high-dose IV fluids for treatment of renal failure which was initiated by nephrology for renal failure ,this constant to develop  pulmonary edema/CHF. -Echo from 2014 with an ejection fraction of 60-65% and grade 1 diastolic dysfunction. Repeat 2-D echo shows EF of 60-65% Treated with Lasix 40 mg IV twice a day, now transitioned to oral torsemide.   Remains slightly hypoxic, will need 2 L of oxygen at discharge, To be weaned by outpatient physician  Hypoglycemia -Resolved. -Likely due to glipizide plus insulin in the setting of acute renal failure.Glipizide has been discontinued -Blood sugar stabilized off dextrose fluids. Lantus is now being adjusted patient will need around 12 units of Lantus at discharge.  Acute renal failure -Creatinine down to 1.46 >1.69 from a high of 1.8.  Non-nephrotic range. She has 2100 milligrams of proteinuria over 24 hours . Etiology could be secondary to diabetes/hypertension/obesity related glomerulopathy/since she has positive ANA and low C4 . Her anti-Smith antibody and double-stranded DNA is normal.  Hypokalemia -Replaced  Hypertension -Fair control on current medications.Blood pressure medication adjusted during this admission. ACE inhibitor/HCTZ discontinued. Patient started on Cardura, Coreg, Demadex and hydralazine  History of CVA -With residual right-sided weakness, at baseline   Discharge Exam:  Blood pressure (!) 158/113, pulse 83, temperature 99.3 F (37.4 C), temperature source Oral, resp. rate 19, height 5' 6"  (1.676 m), weight 94.3 kg (207 lb 14.3 oz), SpO2 92 %.  General exam: Alert, awake, oriented x 3 Respiratory system: bibasilar crackles Cardiovascular system:RRR. No murmurs, rubs,  gallops. Gastrointestinal system: Abdomen is  nondistended, soft and nontender. No organomegaly or masses felt. Normal bowel sounds heard. Central nervous system: Alert and oriented. No focal neurological deficits. Extremities: Bilateral edema +pedal pulses Skin: No rashes, lesions or ulcers Psychiatry: Judgement and insight appear normal. Mood & affect appropriate.      Follow-up Information    Celene Squibb, MD. Schedule an appointment as soon as possible for a visit in 2 week(s).   Specialty:  Internal Medicine Contact information: [place] [address] [place], [facility] Follow up.   Contact information: [address] [place] [address] [phone]        Fran Lowes, MD Follow up in 4 week(s).   Specialty:  Nephrology Contact information: [address] [address] [phone]           Signed: Reyne Dumas 05/08/2017, 10:22 AM        Time spent >1 hour

## 2017-05-08 NOTE — Progress Notes (Addendum)
Patient 87% ambulating on room air, 92% on 3l O2 via [place].

## 2017-05-08 NOTE — Progress Notes (Signed)
Patient states understanding of discharge instructions, prescriptions given 

## 2017-05-08 NOTE — Progress Notes (Signed)
Subjective: Interval History: Patient offers no complaints. She denies any difficulty breathing. Her appetite is good and patient wanted to go home.  Objective: Vital signs in last 24 hours: Temp:  [98.2 F (36.8 C)-99.3 F (37.4 C)] 99.3 F (37.4 C) (07/28 0600) Pulse Rate:  [77-83] 83 (07/28 0600) Resp:  [19-20] 19 (07/28 0600) BP: (145-168)/(51-113) 158/113 (07/28 0600) SpO2:  [91 %-94 %] 92 % (07/28 0600) Weight change:   Intake/Output from previous day: 07/27 0701 - 07/28 0700 In: 720 [P.O.:720] Out: 2600 [Urine:2600] Intake/Output this shift: No intake/output data recorded.  General appearance: alert, cooperative and no distress Resp: diminished breath sounds bilaterally Cardio: regular rate and rhythm Extremities: edema She has trace to 1+ edema  Lab Results:  Recent Labs  05/07/17 0415 05/08/17 0559  WBC 5.9 5.5  HGB 13.1 11.9*  HCT 40.5 36.9  PLT 190 174   BMET:   Recent Labs  05/07/17 0415 05/08/17 0559  NA 138 137  K 3.9 3.6  CL 106 102  CO2 24 26  GLUCOSE 149* 172*  BUN 40* 41*  CREATININE 1.69* 1.69*  CALCIUM 9.0 8.9   No results for input(s): PTH in the last 72 hours. Iron Studies: No results for input(s): IRON, TIBC, TRANSFERRIN, FERRITIN in the last 72 hours.  Studies/Results: No results found.  I have reviewed the patient's current medications.  Assessment/Plan: Problem #1 renal failure: Possibly acute on chronic. Her creatinine Remains stable. And she is asymptomatic. Problem #2 proteinuria: Non-nephrotic range. She has 2100 milligrams of proteinuria over 24 hours. Etiology could be secondary to diabetes/hypertension/obesity related glomerulopathy/since she has positive ANA and low C4 . Her anti-Smith antibody and double-stranded DNA is normal. Problem #3 difficulty breathing: Patient was given IV Lasix and she had 4300 mL of urine output and feeling much better. Problem #4 history of diabetes Problem #5 hypertension: Patient is on  carvedilol/amlodipine/hydralazine and her blood pressure is high but better. Cardura is added Problem #6 history of CVA. Problem #7 bone mineral disorder: Calcium in the range Plan: 1]We'll continue to advise patient to decrease her salt and fluid intake 2] we'll continue with  Demadex 20 mg once a day 3] we'll check her renal panel in the morning 4] I will follow patient in 4 weeks if discharged     LOS: 6 days   Madison Reed S 05/08/2017,9:27 AM

## 2017-05-08 NOTE — Care Management Note (Signed)
Case Management Note  Patient Details  Name: Madison Reed MRN: [id number] Date of Birth: Jun 25, 1951  Subjective/Objective:  Received call from [facility] at [place] [phone])  that pt will require Oxygen along with HHPT/OT and Aide at discharge. All   HH will be provided by [facility] and has been arranged per pts choice. Made Brad aware of pt need, made [facility] aware of needed O2 Sat note amendments.  [facility] will make pt aware she will need to remain at hospital until Oxygen is delivered.               Action/Plan: CM will sign off for now but will be available should additional discharge needs arise or disposition change.    Expected Discharge Date:  05/08/17               Expected Discharge Plan:  [place]  In-House Referral:  NA  Discharge planning Services  CM Consult  Post Acute Care Choice:  Home Health, Resumption of Svcs/PTA Provider Choice offered to:  Patient  DME Arranged:  Oxygen DME Agency:  [place] Arranged:  PT, OT, Nurse's Aide [place] Agency:  [facility]  Status of Service:  Completed, signed off  If discussed at [place] of Stay Meetings, dates discussed:    Additional Comments:  Delrae Sawyers, RN 05/08/2017, 11:20 AM

## 2017-05-10 DIAGNOSIS — R2689 Other abnormalities of gait and mobility: Secondary | ICD-10-CM | POA: Diagnosis not present

## 2017-05-10 DIAGNOSIS — E119 Type 2 diabetes mellitus without complications: Secondary | ICD-10-CM | POA: Diagnosis not present

## 2017-05-10 DIAGNOSIS — Z9181 History of falling: Secondary | ICD-10-CM | POA: Diagnosis not present

## 2017-05-10 DIAGNOSIS — E876 Hypokalemia: Secondary | ICD-10-CM | POA: Diagnosis not present

## 2017-05-10 DIAGNOSIS — R296 Repeated falls: Secondary | ICD-10-CM | POA: Diagnosis not present

## 2017-05-10 DIAGNOSIS — I10 Essential (primary) hypertension: Secondary | ICD-10-CM | POA: Diagnosis not present

## 2017-05-11 DIAGNOSIS — E876 Hypokalemia: Secondary | ICD-10-CM | POA: Diagnosis not present

## 2017-05-11 DIAGNOSIS — R296 Repeated falls: Secondary | ICD-10-CM | POA: Diagnosis not present

## 2017-05-11 DIAGNOSIS — R2689 Other abnormalities of gait and mobility: Secondary | ICD-10-CM | POA: Diagnosis not present

## 2017-05-11 DIAGNOSIS — I10 Essential (primary) hypertension: Secondary | ICD-10-CM | POA: Diagnosis not present

## 2017-05-11 DIAGNOSIS — E119 Type 2 diabetes mellitus without complications: Secondary | ICD-10-CM | POA: Diagnosis not present

## 2017-05-11 DIAGNOSIS — Z9181 History of falling: Secondary | ICD-10-CM | POA: Diagnosis not present

## 2017-05-12 DIAGNOSIS — E119 Type 2 diabetes mellitus without complications: Secondary | ICD-10-CM | POA: Diagnosis not present

## 2017-05-12 DIAGNOSIS — Z9181 History of falling: Secondary | ICD-10-CM | POA: Diagnosis not present

## 2017-05-12 DIAGNOSIS — R2689 Other abnormalities of gait and mobility: Secondary | ICD-10-CM | POA: Diagnosis not present

## 2017-05-12 DIAGNOSIS — I10 Essential (primary) hypertension: Secondary | ICD-10-CM | POA: Diagnosis not present

## 2017-05-12 DIAGNOSIS — R296 Repeated falls: Secondary | ICD-10-CM | POA: Diagnosis not present

## 2017-05-12 DIAGNOSIS — E876 Hypokalemia: Secondary | ICD-10-CM | POA: Diagnosis not present

## 2017-05-13 ENCOUNTER — Other Ambulatory Visit: Payer: Self-pay | Admitting: Internal Medicine

## 2017-05-13 ENCOUNTER — Inpatient Hospital Stay
Admission: RE | Admit: 2017-05-13 | Discharge: 2017-06-05 | Disposition: A | Discharge status: Home / Self Care | Payer: Medicare Other | Source: Ambulatory Visit | Attending: Internal Medicine | Admitting: Internal Medicine

## 2017-05-13 ENCOUNTER — Ambulatory Visit (HOSPITAL_COMMUNITY)
Admission: RE | Admit: 2017-05-13 | Discharge: 2017-05-13 | Disposition: A | Discharge status: Home / Self Care | Payer: Medicare Other | Source: Ambulatory Visit | Attending: Internal Medicine | Admitting: Internal Medicine

## 2017-05-13 ENCOUNTER — Non-Acute Institutional Stay (SKILLED_NURSING_FACILITY): Payer: Medicare Other | Admitting: Internal Medicine

## 2017-05-13 ENCOUNTER — Encounter: Payer: Self-pay | Admitting: Internal Medicine

## 2017-05-13 ENCOUNTER — Encounter (HOSPITAL_COMMUNITY)
Admission: RE | Admit: 2017-05-13 | Discharge: 2017-05-13 | Disposition: A | Discharge status: Home / Self Care | Payer: Medicare Other | Source: Skilled Nursing Facility | Attending: *Deleted | Admitting: *Deleted

## 2017-05-13 ENCOUNTER — Encounter (HOSPITAL_COMMUNITY)
Admission: RE | Admit: 2017-05-13 | Discharge: 2017-05-13 | Disposition: A | Discharge status: Home / Self Care | Payer: Medicare Other | Source: Skilled Nursing Facility | Attending: Pediatrics | Admitting: Pediatrics

## 2017-05-13 DIAGNOSIS — M6281 Muscle weakness (generalized): Secondary | ICD-10-CM | POA: Diagnosis not present

## 2017-05-13 DIAGNOSIS — Z5189 Encounter for other specified aftercare: Secondary | ICD-10-CM | POA: Diagnosis not present

## 2017-05-13 DIAGNOSIS — I5031 Acute diastolic (congestive) heart failure: Secondary | ICD-10-CM | POA: Diagnosis not present

## 2017-05-13 DIAGNOSIS — R1312 Dysphagia, oropharyngeal phase: Secondary | ICD-10-CM | POA: Diagnosis not present

## 2017-05-13 DIAGNOSIS — R2689 Other abnormalities of gait and mobility: Secondary | ICD-10-CM | POA: Diagnosis not present

## 2017-05-13 DIAGNOSIS — R609 Edema, unspecified: Secondary | ICD-10-CM

## 2017-05-13 DIAGNOSIS — R110 Nausea: Secondary | ICD-10-CM | POA: Diagnosis not present

## 2017-05-13 DIAGNOSIS — I10 Essential (primary) hypertension: Secondary | ICD-10-CM | POA: Insufficient documentation

## 2017-05-13 DIAGNOSIS — R0602 Shortness of breath: Secondary | ICD-10-CM | POA: Diagnosis not present

## 2017-05-13 DIAGNOSIS — E1040 Type 1 diabetes mellitus with diabetic neuropathy, unspecified: Secondary | ICD-10-CM

## 2017-05-13 DIAGNOSIS — Z8673 Personal history of transient ischemic attack (TIA), and cerebral infarction without residual deficits: Secondary | ICD-10-CM | POA: Diagnosis not present

## 2017-05-13 DIAGNOSIS — R0600 Dyspnea, unspecified: Secondary | ICD-10-CM | POA: Diagnosis not present

## 2017-05-13 DIAGNOSIS — E1140 Type 2 diabetes mellitus with diabetic neuropathy, unspecified: Secondary | ICD-10-CM | POA: Diagnosis not present

## 2017-05-13 DIAGNOSIS — G629 Polyneuropathy, unspecified: Secondary | ICD-10-CM

## 2017-05-13 DIAGNOSIS — M545 Low back pain: Secondary | ICD-10-CM | POA: Diagnosis not present

## 2017-05-13 DIAGNOSIS — N179 Acute kidney failure, unspecified: Secondary | ICD-10-CM | POA: Diagnosis not present

## 2017-05-13 DIAGNOSIS — R0902 Hypoxemia: Secondary | ICD-10-CM

## 2017-05-13 DIAGNOSIS — E162 Hypoglycemia, unspecified: Secondary | ICD-10-CM | POA: Diagnosis not present

## 2017-05-13 DIAGNOSIS — L409 Psoriasis, unspecified: Secondary | ICD-10-CM | POA: Diagnosis not present

## 2017-05-13 DIAGNOSIS — Z9981 Dependence on supplemental oxygen: Secondary | ICD-10-CM | POA: Diagnosis not present

## 2017-05-13 DIAGNOSIS — I517 Cardiomegaly: Secondary | ICD-10-CM | POA: Insufficient documentation

## 2017-05-13 DIAGNOSIS — I69398 Other sequelae of cerebral infarction: Secondary | ICD-10-CM | POA: Diagnosis not present

## 2017-05-13 DIAGNOSIS — Z9181 History of falling: Secondary | ICD-10-CM | POA: Diagnosis not present

## 2017-05-13 DIAGNOSIS — R29898 Other symptoms and signs involving the musculoskeletal system: Secondary | ICD-10-CM | POA: Diagnosis not present

## 2017-05-13 DIAGNOSIS — I5030 Unspecified diastolic (congestive) heart failure: Secondary | ICD-10-CM | POA: Diagnosis not present

## 2017-05-13 DIAGNOSIS — E876 Hypokalemia: Secondary | ICD-10-CM | POA: Diagnosis not present

## 2017-05-13 DIAGNOSIS — J90 Pleural effusion, not elsewhere classified: Secondary | ICD-10-CM | POA: Insufficient documentation

## 2017-05-13 DIAGNOSIS — N760 Acute vaginitis: Secondary | ICD-10-CM | POA: Diagnosis not present

## 2017-05-13 DIAGNOSIS — E1121 Type 2 diabetes mellitus with diabetic nephropathy: Secondary | ICD-10-CM | POA: Diagnosis not present

## 2017-05-13 DIAGNOSIS — R600 Localized edema: Secondary | ICD-10-CM | POA: Diagnosis not present

## 2017-05-13 DIAGNOSIS — I5032 Chronic diastolic (congestive) heart failure: Secondary | ICD-10-CM | POA: Diagnosis not present

## 2017-05-13 DIAGNOSIS — Z8701 Personal history of pneumonia (recurrent): Secondary | ICD-10-CM | POA: Diagnosis not present

## 2017-05-13 DIAGNOSIS — N390 Urinary tract infection, site not specified: Secondary | ICD-10-CM | POA: Diagnosis not present

## 2017-05-13 DIAGNOSIS — N289 Disorder of kidney and ureter, unspecified: Secondary | ICD-10-CM | POA: Diagnosis not present

## 2017-05-13 DIAGNOSIS — R918 Other nonspecific abnormal finding of lung field: Secondary | ICD-10-CM | POA: Insufficient documentation

## 2017-05-13 DIAGNOSIS — R279 Unspecified lack of coordination: Secondary | ICD-10-CM | POA: Diagnosis not present

## 2017-05-13 LAB — CBC WITH DIFFERENTIAL/PLATELET
BASOS ABS: 0 10*3/uL (ref 0.0–0.1)
Basophils Absolute: 0 10*3/uL (ref 0.0–0.1)
Basophils Relative: 1 %
Basophils Relative: 1 %
EOS ABS: 0.1 10*3/uL (ref 0.0–0.7)
EOS ABS: 0.1 10*3/uL (ref 0.0–0.7)
EOS PCT: 2 %
Eosinophils Relative: 1 %
HCT: 36.1 % (ref 36.0–46.0)
HCT: 36.2 % (ref 36.0–46.0)
HEMOGLOBIN: 11.6 g/dL — AB (ref 12.0–15.0)
Hemoglobin: 11.7 g/dL — ABNORMAL LOW (ref 12.0–15.0)
LYMPHS PCT: 16 %
LYMPHS PCT: 15 %
Lymphs Abs: 0.9 10*3/uL (ref 0.7–4.0)
Lymphs Abs: 0.9 10*3/uL (ref 0.7–4.0)
MCH: 29.5 pg (ref 26.0–34.0)
MCH: 29.2 pg (ref 26.0–34.0)
MCHC: 32.4 g/dL (ref 30.0–36.0)
MCHC: 32 g/dL (ref 30.0–36.0)
MCV: 90.9 fL (ref 78.0–100.0)
MCV: 91.2 fL (ref 78.0–100.0)
MONO ABS: 0.5 10*3/uL (ref 0.1–1.0)
MONOS PCT: 9 %
Monocytes Absolute: 0.4 10*3/uL (ref 0.1–1.0)
Monocytes Relative: 6 %
NEUTROS PCT: 77 %
Neutro Abs: 4 10*3/uL (ref 1.7–7.7)
Neutro Abs: 4.5 10*3/uL (ref 1.7–7.7)
Neutrophils Relative %: 72 %
PLATELETS: 166 10*3/uL (ref 150–400)
Platelets: 169 10*3/uL (ref 150–400)
RBC: 3.97 MIL/uL (ref 3.87–5.11)
RBC: 3.97 MIL/uL (ref 3.87–5.11)
RDW: 16.1 % — AB (ref 11.5–15.5)
RDW: 16.1 % — ABNORMAL HIGH (ref 11.5–15.5)
WBC: 5.5 10*3/uL (ref 4.0–10.5)
WBC: 5.9 10*3/uL (ref 4.0–10.5)

## 2017-05-13 LAB — COMPREHENSIVE METABOLIC PANEL
ALK PHOS: 43 U/L (ref 38–126)
ALT: 11 U/L — ABNORMAL LOW (ref 14–54)
ALT: 11 U/L — AB (ref 14–54)
ANION GAP: 7 (ref 5–15)
AST: 16 U/L (ref 15–41)
AST: 17 U/L (ref 15–41)
Albumin: 2.9 g/dL — ABNORMAL LOW (ref 3.5–5.0)
Albumin: 3.1 g/dL — ABNORMAL LOW (ref 3.5–5.0)
Alkaline Phosphatase: 44 U/L (ref 38–126)
Anion gap: 7 (ref 5–15)
BUN: 39 mg/dL — ABNORMAL HIGH (ref 6–20)
BUN: 38 mg/dL — AB (ref 6–20)
CALCIUM: 9 mg/dL (ref 8.9–10.3)
CHLORIDE: 101 mmol/L (ref 101–111)
CHLORIDE: 102 mmol/L (ref 101–111)
CO2: 31 mmol/L (ref 22–32)
CO2: 30 mmol/L (ref 22–32)
CREATININE: 1.89 mg/dL — AB (ref 0.44–1.00)
Calcium: 8.8 mg/dL — ABNORMAL LOW (ref 8.9–10.3)
Creatinine, Ser: 1.83 mg/dL — ABNORMAL HIGH (ref 0.44–1.00)
GFR calc Af Amer: 32 mL/min — ABNORMAL LOW (ref >60)
GFR calc non Af Amer: 28 mL/min — ABNORMAL LOW (ref >60)
GFR calc non Af Amer: 27 mL/min — ABNORMAL LOW (ref >60)
GFR, EST AFRICAN AMERICAN: 33 mL/min — AB (ref >60)
GLUCOSE: 216 mg/dL — AB (ref 65–99)
Glucose, Bld: 171 mg/dL — ABNORMAL HIGH (ref 65–99)
Potassium: 3.6 mmol/L (ref 3.5–5.1)
Potassium: 3.8 mmol/L (ref 3.5–5.1)
SODIUM: 139 mmol/L (ref 135–145)
SODIUM: 139 mmol/L (ref 135–145)
Total Bilirubin: 1 mg/dL (ref 0.3–1.2)
Total Bilirubin: 0.8 mg/dL (ref 0.3–1.2)
Total Protein: 6.5 g/dL (ref 6.5–8.1)
Total Protein: 6.6 g/dL (ref 6.5–8.1)

## 2017-05-13 LAB — BRAIN NATRIURETIC PEPTIDE: B NATRIURETIC PEPTIDE 5: 347 pg/mL — AB (ref 0.0–100.0)

## 2017-05-13 NOTE — Progress Notes (Signed)
Location:   [facility] Room Number: 126/P Place of Service:  SNF [phone]) Provider:  Cory Roughen, MD  Patient Care Team: Celene Squibb, MD as PCP - General (Internal Medicine)  Extended Emergency Contact Information Primary Emergency Contact: Tew,Kimberly Address: [address], [place] Phone: [phone] Relation: Daughter Secondary Emergency Contact: Tia Alert [place] Phone: [phone] Mobile Phone: [phone] Relation: Spouse  Code Status:  Full Code Goals of care: Advanced Directive information Advanced Directives 05/13/2017  Does Patient Have a Medical Advance Directive? Yes  Type of Advance Directive (No Data)  Does patient want to make changes to medical advance directive? No - Patient declined  Copy of [place] in Chart? -  Would patient like information on creating a medical advance directive? No - Patient declined  Pre-existing out of facility DNR order (yellow form or pink MOST form) -     Chief Complaint  Patient presents with  . Acute Visit    Acute Visit  For admission to facility after hospitalization for hypo glycemia-respiratory failure-acute renal failure  HPII:  Pt is a 66 y.o. female seen today for an acute visit for  initially facility-she had been hospitalized up until July 28 -- She had symptomatic hypoglycemia was also found to have new acute renal failure with a creatinine of 1.6-her hypoglycemia resolved.  Follow G was consulted and worked her up for acute renal failure.  She did become shortness of breath and did require oxygen in the hospital.  It was thought that shortness of breath apparently was caused by the high-dose IV fluids she received for treatment of renal failure.  Echo showed ejection fraction of 1962% with diastolic CHF.  She has been discharged on oral torsemide.  Continues with edema it appears she  gained about 20 pounds during her hospitalization her weight will need to be monitored closely.  Regards to hypoglycemia this was thought to be due to the glipizide plus insulin complicated with acute renal failure.  Glipizide was discontinued apparently blood sugars stabilize Lantus was adjusted she is now on 12 units-according the family since she's been home blood sugars have been more in the mid 100 range.  She is also on Januvia I note.  In regards to acute renal failure creatinine did go down to 1.46 before discharge was 1.69 apparently the highest was 1.8 --she did have proteinuria were 24 hours thought secondary possibly to diabetes-hypertension obesity-related --.  In regards to hypertension apparently it was fairly well controlled in the hospital medications were adjusted her hydrochlorothiazide and ACE inhibitor were discontinued I suspect a renal issues-he was started on Cardura also on Coreg hydralazine Norvasc and is on Demadex now.  Blood pressure today was 152/66-   currently she is resting in bed comfortably O2 sat with exertion and no oxygen did go down the 70s but with oxygen quickly rose into the low 90s-she says her breathing is better on the oxygen-she does not complain of any pain or acute shortness of breath at this time.  She does have a history of neuropathy is on Neurontin.  I suspect this is diabetic related   Past Medical History:  Diagnosis Date  . Abdominal aneurysm ([place])   . Bone spur   . Chronic back pain   . CVA (cerebral infarction)   . Diabetes mellitus without complication ([place])   .  Hypertension   . Kidney stones   . Neuropathy   . Psoriasis   . Stroke ([place])   . Wound, open, foot    Past Surgical History:  Procedure Laterality Date  . ABDOMINAL HYSTERECTOMY    . BLADDER SUSPENSION    . VESICOVAGINAL FISTULA CLOSURE W/ TAH      Allergies  Allergen Reactions  . Penicillins Itching    Has patient had a PCN reaction causing immediate rash,  facial/tongue/throat swelling, SOB or lightheadedness with hypotension: Yes Has patient had a PCN reaction causing severe rash involving mucus membranes or skin necrosis: No Has patient had a PCN reaction that required hospitalization: No Has patient had a PCN reaction occurring within the last 10 years: No If all of the above answers are "NO", then may proceed with Cephalosporin use.   . Clonidine Derivatives Other (See Comments)    Numbness  to tongue face, and arm  . Codeine Itching  . Hydrocodone-Acetaminophen Itching  . Shellfish Allergy Itching  . Sulfa Antibiotics Itching and Rash    Outpatient Encounter Prescriptions as of 05/13/2017  Medication Sig  . amLODipine (NORVASC) 10 MG tablet Take 10 mg by mouth daily.  Marland Kitchen aspirin 325 MG tablet Take 1 tablet (325 mg total) by mouth daily.  . blood glucose meter kit and supplies KIT Dispense based on patient and insurance preference. Use up to four times daily as directed. (FOR ICD-9 250.00, 250.01).  . carvedilol (COREG) 6.25 MG tablet Take 1 tablet (6.25 mg total) by mouth 2 (two) times daily with a meal.  . doxazosin (CARDURA) 4 MG tablet Take 1 tablet (4 mg total) by mouth daily.  . fenofibrate 160 MG tablet Take 160 mg by mouth daily.  . fexofenadine (ALLEGRA) 180 MG tablet Take 180 mg by mouth daily.  Marland Kitchen gabapentin (NEURONTIN) 100 MG capsule Take 2 capsules (200 mg total) by mouth 3 (three) times daily.  . hydrALAZINE (APRESOLINE) 100 MG tablet Take 1 tablet (100 mg total) by mouth every 8 (eight) hours.  . insulin glargine (LANTUS) 100 UNIT/ML injection Inject 0.12 mLs (12 Units total) into the skin daily.  . sitaGLIPtin (JANUVIA) 100 MG tablet Take 100 mg by mouth daily.  Marland Kitchen torsemide (DEMADEX) 20 MG tablet Take 1 tablet (20 mg total) by mouth daily.   No facility-administered encounter medications on file as of 05/13/2017.     Review of Systems   Obtain from patient as well as with family members.  In general does not complain  of fever or chills says she feels weak.  Skin does not complain of itching she does have a rash in her groin area and under her breasts thought to be fungal.  Head ears eyes nose mouth and throat does not complaining of any sore throat apparently at times she does have difficulty swallowing bread according the patient-she also has blindness in the left eye and some blurriness at times of the right eye which is not new.  Respiratory currently not complaining of shortness of breath but gets short of breath when she is not on oxygen or with significant exertion.  Cardiac does not complain of chest pain has had edema she says this has been somewhat chronic since her hospitalization.  GI is not complaining of abdominal discomfort nausea vomiting diarrhea or constipation.  Muscle skeletal has weakness especially lower extremities is not complaining of joint pain however.  Neurologic does complain of neuropathy of her hands does not complaining currently of dizziness headache or  syncope.  Psych appears slightly anxious but does not complain of overt anxiety or depression appears less anxious awhen she was positioned in bed and resting comfortably   Immunization History  Administered Date(s) Administered  . Influenza,inj,Quad PF,36+ Mos 08/04/2013  . Pneumococcal Polysaccharide-23 08/04/2013   Pertinent  Health Maintenance Due  Topic Date Due  . OPHTHALMOLOGY EXAM  06/12/2017 (Originally 06/19/1961)  . URINE MICROALBUMIN  06/12/2017 (Originally 06/19/1961)  . FOOT EXAM  06/13/2017 (Originally 06/19/1961)  . MAMMOGRAM  06/13/2017 (Originally 06/19/2003)  . INFLUENZA VACCINE  09/11/2017 (Originally 05/12/2017)  . PAP SMEAR  09/11/2017 (Originally 06/18/1974)  . COLONOSCOPY  09/11/2017 (Originally 06/19/2003)  . HEMOGLOBIN A1C  11/01/2017   No flowsheet data found. Functional Status Survey:    Vitals:   05/13/17 1509  BP: (!) 152/66  Pulse: 79  Resp: (!) 22  Temp: 98 F (36.7 C)  TempSrc: Oral    O2 saturation is 92% on 3 L  Physical Exam now a     in general this is a pleasant middle-aged female in no distress she does appear weak and frail however.  Her skin is warm and dry she does have rashes under her breasts as well as abdominal folds this is being addressed by nursing.  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  Chest she has shallow air entry but I could not really appreciate overt congestion there is no labored breathing.  Heart is regular rate and rhythm without murmur gallop or rub she has always say 2+ lower extremity edema bilaterally this is cool to touch non-erythematous  Abdomen is obese soft nontender slightly hypoactive bowel sounds.  Muscle skeletal is able to move all extremities 4 grip strength  strong bilaterally she is able to transfer but is quite weak-  Neurologic appears grossly intact her speech is clear could not really appreciate any gross lateralizing findings although she does have a listed history of right-sided weakness grip strength appeared to be strong bilaterally she is able to transfer .  Psych she is alert and oriented pleasant and appropriate.     Labs reviewed:  Recent Labs  04/30/17 1740  05/05/17 0655 05/06/17 0622 05/07/17 0415 05/08/17 0559  NA 138  < > 136 136 138 137  K 2.8*  < > 4.2 4.2 3.9 3.6  CL 104  < > 106 108 106 102  CO2 23  < > 23 21* 24 26  GLUCOSE 41*  < > 160* 157* 149* 172*  BUN 30*  < > 39* 38* 40* 41*  CREATININE 1.57*  < > 1.57* 1.46* 1.69* 1.69*  CALCIUM 9.0  < > 8.8* 8.9 9.0 8.9  MG 2.3  --   --   --   --   --   PHOS  --   --  4.2  --  4.7* 4.8*  < > = values in this interval not displayed.  Recent Labs  04/30/17 1740 05/04/17 1419 05/05/17 0655 05/07/17 0415 05/08/17 0559  AST 30 23  --   --   --   ALT 14 16  --   --   --   ALKPHOS 49 39  --   --   --   BILITOT 1.2 1.0  --   --   --   PROT 8.1  6.7  --   --   --   ALBUMIN 3.8 3.1* 3.1* 3.1* 3.0*  Recent Labs  04/30/17 1740 05/01/17 0624 05/07/17 0415 05/08/17 0559  WBC 6.7 8.5 5.9 5.5  NEUTROABS 4.8  --   --   --   HGB 15.0 14.3 13.1 11.9*  HCT 45.5 44.5 40.5 36.9  MCV 87.8 88.6 88.8 89.3  PLT 202 208 190 174   Lab Results  Component Value Date   TSH 3.612 05/01/2017   Lab Results  Component Value Date   HGBA1C 5.6 05/01/2017   Lab Results  Component Value Date   CHOL 179 08/03/2013   HDL 29 (L) 08/03/2013   LDLCALC 81 08/03/2013   TRIG 344 (H) 08/03/2013   CHOLHDL 6.2 08/03/2013    Significant Diagnostic Results in last 30 days:  Dg Chest 2 View  Result Date: 04/30/2017 CLINICAL DATA:  Weakness with hypoglycemia EXAM: CHEST  2 VIEW COMPARISON:  03/24/2015 FINDINGS: Cardiomegaly with aortic atherosclerosis. No thoracic aortic aneurysm. No acute pulmonary consolidation, effusion or pneumothorax. No acute nor suspicious osseous lesions. IMPRESSION: Stable cardiomegaly without acute pulmonary disease. Aortic atherosclerosis without aneurysm. Electronically Signed   By: Ashley Royalty M.D.   On: 04/30/2017 19:42   US Renal  Result Date: 05/02/2017 CLINICAL DATA:  Acute kidney injury. EXAM: RENAL / URINARY TRACT ULTRASOUND COMPLETE COMPARISON:  CT 03/17/2012 FINDINGS: Right Kidney: Length: 11.9 cm. Echogenicity within normal limits. No mass or hydronephrosis visualized. Previous right renal stone is not seen, may have passed in the interim or not well visualized sonographically. Left Kidney: Length: 13.1 cm. Echogenicity within normal limits. No mass or hydronephrosis visualized. Previous left renal stones are not seen, may have passed in the interim or not visualize sonographically. Bladder: Appears normal for degree of bladder distention. Neither ureteral jet was visualized. IMPRESSION: No hydronephrosis or obstructive uropathy. Unremarkable renal ultrasound. Electronically Signed   By: Jeb Levering M.D.   On:  05/02/2017 22:35   Dg Chest Port 1 View  Result Date: 05/06/2017 CLINICAL DATA:  Shortness of breath, history of diabetes, hypertension, acute renal injury, former smoker. EXAM: PORTABLE CHEST 1 VIEW COMPARISON:  PA and lateral chest x-ray of April 30, 2017 FINDINGS: The lungs are well-expanded. The interstitial markings are coarse and more conspicuous today. There is no pleural effusion or pneumothorax. The cardiac silhouette is enlarged. The central pulmonary vascularity is prominent but stable. There is no definite cephalization. The bony thorax exhibits no acute abnormality. IMPRESSION: Chronic cardiomegaly without significant pulmonary vascular congestion. Increased prominence of the pulmonary interstitium may reflect mild interstitial edema or less likely pneumonia. Thoracic aortic atherosclerosis. A PA and lateral chest x-ray would be useful. Electronically Signed   By: David  Martinique M.D.   On: 05/06/2017 08:03    Assessment/Plan  #1-history of hypoglycemia-again this is stabilized apparently on current medications she is on Lantus as well as Januvia -- sugars were more in the mid 100s at home this will be checked 4 times a day in facility and monitored  #2 history of acute renal failure-creatinine discharge was 1.69-unsure exactly what her baseline  will update this this is complicated again with her diuretics-she has been followed by nephrology in the hospital it appears ultrasound in the hospital did not show any evidence of acute process no hydro-nephrosis  #3 hypertension-we will monitor while she is here just was rated the hospital she is off the Ace because of her renal issues currently on Cardura-Coreg-Demadex-Norvasc-hydralazine-systolic is in the 932I today again will watch this apparently had been significantly elevated previously per family.  #4-history of peripheral neuropathy  likely diabetic related she is on Neurontin apparently this is been a long-term issue.  #5 history CVA  she does have apparently some residual right-sided weakness she is on aspirin 325 mg a day  #7 rash likely fungal this is being treated topically by wound care continue to monitor   Again we are obtaining an updated CBC and CMP today-also will obtain a chest x-ray to follow-up-on possible pulmonary edema chest congestion apparently at times she does have difficulty swallowing at home according to her family-she will need speech therapy evaluation as well   [id number]-of note greater than 45 minutes spent assessing patient reviewing her chart-reviewing her labs-discussing her status with nursing as well as with family in room-and coordinating and formulating a plan of care for numerous diagnoses-of note greater than 50% of time spent coordinating plan of care  We have obtain updated labs it shows her creatinine has risen slightly up to 1.83 BUN is stable at 39 sodium 139 potassium 3.6 albumin is 2.9 which is relative to what it was in the hospital  CBC is fairly unremarkable white count is 5.5 hemoglobin 11.7 platelets 166.  This was discussed with Dr. Bubba Camp via phone  -I did discuss possibility of going to the ER with her husband but he would prefer that she stay in facility unless she is in any emergent distress which she is not in currently.  So subsequent to discussion with Dr. Auburn Bilberry obtain a stat BNP as well as chest x-ray as noted above.  Will increase oxygen to 4 or 5 L to keep stats greater than or equal to 88%-also will give a dose of Demadex 40 mg tonight-and also a dose of Avelox for concerns of possible aspiration with recent difficulty apparently coughing while eating.  If chest x-ray comes back negative for pneumonia Will DC the Avelox.  Also monitor vital signs every 4 hours with pulse ox and monitor weights daily notify provider of gain greater than 3 pounds.  I did reassess patient vital signs are stable O2 stats are 87 percent on 3 L again this will be bumped up  oxygen wise. Thank you still have the right knee ordered 10 She had been on 4 L at home.  Physical exam was essentially unchanged no sign of distress -- was eating dinner and watching TV comfortably when I saw her this evening did not complain of any shortness of breath or discomfort

## 2017-05-14 ENCOUNTER — Non-Acute Institutional Stay (SKILLED_NURSING_FACILITY): Payer: Medicare Other | Admitting: Internal Medicine

## 2017-05-14 ENCOUNTER — Encounter (HOSPITAL_COMMUNITY)
Admission: RE | Admit: 2017-05-14 | Discharge: 2017-05-14 | Disposition: A | Discharge status: Home / Self Care | Payer: Medicare Other | Source: Skilled Nursing Facility | Attending: *Deleted | Admitting: *Deleted

## 2017-05-14 ENCOUNTER — Encounter: Payer: Self-pay | Admitting: Internal Medicine

## 2017-05-14 DIAGNOSIS — R609 Edema, unspecified: Secondary | ICD-10-CM | POA: Diagnosis not present

## 2017-05-14 DIAGNOSIS — Z8701 Personal history of pneumonia (recurrent): Secondary | ICD-10-CM

## 2017-05-14 DIAGNOSIS — N179 Acute kidney failure, unspecified: Secondary | ICD-10-CM

## 2017-05-14 DIAGNOSIS — E876 Hypokalemia: Secondary | ICD-10-CM

## 2017-05-14 LAB — BASIC METABOLIC PANEL
ANION GAP: 10 (ref 5–15)
BUN: 37 mg/dL — ABNORMAL HIGH (ref 6–20)
CHLORIDE: 98 mmol/L — AB (ref 101–111)
CO2: 30 mmol/L (ref 22–32)
Calcium: 8.8 mg/dL — ABNORMAL LOW (ref 8.9–10.3)
Creatinine, Ser: 1.8 mg/dL — ABNORMAL HIGH (ref 0.44–1.00)
GFR calc Af Amer: 33 mL/min — ABNORMAL LOW (ref >60)
GFR calc non Af Amer: 29 mL/min — ABNORMAL LOW (ref >60)
GLUCOSE: 142 mg/dL — AB (ref 65–99)
POTASSIUM: 3.2 mmol/L — AB (ref 3.5–5.1)
SODIUM: 138 mmol/L (ref 135–145)

## 2017-05-14 NOTE — Progress Notes (Signed)
Location:   [facility] Room Number: 126/P Place of Service:  SNF [id number]) Provider:  Cory Roughen, MD  Patient Care Team: Celene Squibb, MD as PCP - General (Internal Medicine)  Extended Emergency Contact Information Primary Emergency Contact: Tew,Kimberly Address: [address], [place] Phone: [phone] Relation: Daughter Secondary Emergency Contact: Tia Alert [place] Phone: [phone] Mobile Phone: [phone] Relation: Spouse  Code Status:  Full Code Goals of care: Advanced Directive information Advanced Directives 05/14/2017  Does Patient Have a Medical Advance Directive? Yes  Type of Advance Directive (No Data)  Does patient want to make changes to medical advance directive? No - Patient declined  Copy of [place] in Chart? -  Would patient like information on creating a medical advance directive? No - Patient declined  Pre-existing out of facility DNR order (yellow form or pink MOST form) -     Acute visit follow-up hypoxia renal insufficiency  HPI:  Pt is a 66 y.o. female seen today for she was admitted yesterday from home-she had been hospitalized up till July 28-she had symptomatic hypoglycemia and also had new acute renal failure.  Hypoglycemia resolved her medications were changed-she is now on Lantus and Januvia blood sugars actually appears stabilized it was 164 this morning last night was 168 and at at bedtime 217.  We did do updated labs yesterday which did show her creatinine of 1.89-BUN was 38 otherwise electrolytes were within normal range albumin was 3.1.  On admission she was hypoxic apparently had been on 4 L oxygen at home we did eventually titrate this up to 4 L here in her O2 stats did go into the mid 90s.  She also has significant edema according the family she had this in the hospital as well when she was given fluids--.  She  was discharged on Demadex 20 mg a day-apparently edema persisted at home however.  We did discuss sending her to the ER yesterday with family but her husband would prefer she stay in the facility unless she was in an emergent situation.  I did discuss this with the on-call physician and we did give her an x-ray dose of Demadex 40 mg last night BNP was-347.  A chest x-ray was done. Which showed cardiac enlargement with mild pulmonary vascular congestion and perihilar edema-bilateral pleural effusions-asymmetrical edema versus pneumonia in the right lung base.  There was concern for aspiration apparently patient had had some difficulty eating certain foods at home and was coughing-we did start her on Avelox  We did updated labs today which have returned showing stability her creatinine is 1.8-potassium is down to 3.2 CO2 level was 30.  She is down about a pound this morning compared to yesterday.  Apparently she did quite well this morning ate her breakfast participated with therapy and appeared to be more energetic-when I saw her later in the morning she was somewhat tired appearing but in no distress resting comfortably in bed.  She was alert responsive just tired appearing.  Vital signs are stable 2 saturations are in the low 90s on 4 L.  She does not complain of any pain or shortness of breath.      .--     Past Medical History:  Diagnosis Date  . Abdominal aneurysm ([place])   . Bone spur   . Chronic back pain   .  CVA (cerebral infarction)   . Diabetes mellitus without complication ([place])   . Hypertension   . Kidney stones   . Neuropathy   . Psoriasis   . Stroke ([place])   . Wound, open, foot    Past Surgical History:  Procedure Laterality Date  . ABDOMINAL HYSTERECTOMY    . BLADDER SUSPENSION    . VESICOVAGINAL FISTULA CLOSURE W/ TAH      Allergies  Allergen Reactions  . Penicillins Itching    Has patient had a PCN reaction causing immediate rash,  facial/tongue/throat swelling, SOB or lightheadedness with hypotension: Yes Has patient had a PCN reaction causing severe rash involving mucus membranes or skin necrosis: No Has patient had a PCN reaction that required hospitalization: No Has patient had a PCN reaction occurring within the last 10 years: No If all of the above answers are "NO", then may proceed with Cephalosporin use.   . Clonidine Derivatives Other (See Comments)    Numbness  to tongue face, and arm  . Codeine Itching  . Hydrocodone-Acetaminophen Itching  . Shellfish Allergy Itching  . Sulfa Antibiotics Itching and Rash    Outpatient Encounter Prescriptions as of 05/14/2017  Medication Sig  . amLODipine (NORVASC) 10 MG tablet Take 10 mg by mouth daily.  Marland Kitchen aspirin 325 MG tablet Take 1 tablet (325 mg total) by mouth daily.  . blood glucose meter kit and supplies KIT Dispense based on patient and insurance preference. Use up to four times daily as directed. (FOR ICD-9 250.00, 250.01).  . carvedilol (COREG) 6.25 MG tablet Take 1 tablet (6.25 mg total) by mouth 2 (two) times daily with a meal.  . doxazosin (CARDURA) 4 MG tablet Take 1 tablet (4 mg total) by mouth daily.  . fenofibrate 160 MG tablet Take 160 mg by mouth daily.  . fexofenadine (ALLEGRA) 180 MG tablet Take 180 mg by mouth daily.  Marland Kitchen gabapentin (NEURONTIN) 100 MG capsule Take 2 capsules (200 mg total) by mouth 3 (three) times daily.  . hydrALAZINE (APRESOLINE) 100 MG tablet Take 1 tablet (100 mg total) by mouth every 8 (eight) hours.  . insulin glargine (LANTUS) 100 UNIT/ML injection Inject 0.12 mLs (12 Units total) into the skin daily.  Marland Kitchen ketoconazole (NIZORAL) 2 % cream Apply to fungal rash under abdominal folds and bilateral breasts twice a day.  . moxifloxacin (AVELOX) 400 MG tablet Take 400 mg by mouth daily at 8 pm.  . sitaGLIPtin (JANUVIA) 100 MG tablet Take 100 mg by mouth daily.  Marland Kitchen torsemide (DEMADEX) 20 MG tablet Take 1 tablet (20 mg total) by mouth  daily.   No facility-administered encounter medications on file as of 05/14/2017.     Review of Systems   General she is not complaining fever or chills says she feels well better than she did earlier the week.  Skin does not complain or rashes or itching or diaphoresis.  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  Respiratory does not complain of shortness breath or cough.  Cardiac does not complaining of any chest pain continues with fairly significant lower extremity edema but this does not appear to be increased from yesterday.  GI is not complaining of any abdominal pain nausea vomiting diarrhea patient.  Musculoskeletal has weakness but does not complaining of joint pain apparently did well with therapy today I think they did this in her room  in bed.  Neurologic is not complaining of any dizziness headache or syncope.--She does have weakness  Psych does not complaining of overt anxiety or depression  Immunization History  Administered Date(s) Administered  . Influenza,inj,Quad PF,36+ Mos 08/04/2013  . Pneumococcal Polysaccharide-23 08/04/2013   Pertinent  Health Maintenance Due  Topic Date Due  . OPHTHALMOLOGY EXAM  06/12/2017 (Originally 06/19/1961)  . URINE MICROALBUMIN  06/12/2017 (Originally 06/19/1961)  . FOOT EXAM  06/13/2017 (Originally 06/19/1961)  . MAMMOGRAM  06/13/2017 (Originally 06/19/2003)  . INFLUENZA VACCINE  09/11/2017 (Originally 05/12/2017)  . PAP SMEAR  09/11/2017 (Originally 06/18/1974)  . COLONOSCOPY  09/11/2017 (Originally 06/19/2003)  . HEMOGLOBIN A1C  11/01/2017   No flowsheet data found. Functional Status Survey:    Vitals:   05/14/17 1139  BP: 131/63  Pulse: 62  Resp: 18  Temp: 98.3 F (36.8 C)  TempSrc: Oral    Physical Exam  in general this is a pleasant middle-aged female in no distress she is resting comfortably in  bed appears weak and tired but is alert-apparently had done well with therapy and very well with breakfast and nursing staff earlier in the day says she is a bit tired from therapy  Her skin is warm and dry she does have rashes under her breasts as well as abdominal folds this is being addressed by nursing.  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  Chest she has shallow air entry but I could not really appreciate overt congestion there is no labored breathing. She appears to be comfortable she has oxygen applied at 4 L  Heart is regular rate and rhythm without murmur gallop or rub she continues with 2+ lower extremity edema bilaterally this is cool to touch non-erythematous  Abdomen is obese soft nontender slightly hypoactive bowel sounds.  Muscle skeletal is able to move all extremities 4 grip strength  strong bilaterally   Neurologic appears grossly intact her speech is clear could not really appreciate any gross lateralizing findings although she does have a listed history of right-sided weakness it appears her lower extremity weakness persists suspect debility contributing to this .  Psych she is alert and oriented pleasant and appropriate.    Labs reviewed:  Recent Labs  04/30/17 1740  05/05/17 0655  05/07/17 0415 05/08/17 0559 05/13/17 1520 05/13/17 2130 05/14/17 0900  NA 138  < > 136  < > 138 137 139 139 138  K 2.8*  < > 4.2  < > 3.9 3.6 3.6 3.8 3.2*  CL 104  < > 106  < > 106 102 101 102 98*  CO2 23  < > 23  < > 24 26 31 30 30   GLUCOSE 41*  < > 160*  < > 149* 172* 171* 216* 142*  BUN 30*  < > 39*  < > 40* 41* 39* 38* 37*  CREATININE 1.57*  < > 1.57*  < > 1.69* 1.69* 1.83* 1.89* 1.80*  CALCIUM 9.0  < > 8.8*  < > 9.0 8.9 8.8* 9.0 8.8*  MG 2.3  --   --   --   --   --   --   --   --   PHOS  --   --  4.2  --  4.7* 4.8*  --   --   --   < > = values in this interval not  displayed.  Recent Labs  05/04/17 1419  05/08/17 0559 05/13/17 1520 05/13/17 2130  AST 23  --   --  16 17  ALT 16  --   --  11* 11*  ALKPHOS 39  --   --  44 43  BILITOT 1.0  --   --  1.0 0.8  PROT 6.7  --   --  6.5 6.6  ALBUMIN 3.1*  < > 3.0* 2.9* 3.1*  < > = values in this interval not displayed.  Recent Labs  04/30/17 1740  05/08/17 0559 05/13/17 1520 05/13/17 2130  WBC 6.7  < > 5.5 5.5 5.9  NEUTROABS 4.8  --   --  4.0 4.5  HGB 15.0  < > 11.9* 11.7* 11.6*  HCT 45.5  < > 36.9 36.1 36.2  MCV 87.8  < > 89.3 90.9 91.2  PLT 202  < > 174 166 169  < > = values in this interval not displayed. Lab Results  Component Value Date   TSH 3.612 05/01/2017   Lab Results  Component Value Date   HGBA1C 5.6 05/01/2017   Lab Results  Component Value Date   CHOL 179 08/03/2013   HDL 29 (L) 08/03/2013   LDLCALC 81 08/03/2013   TRIG 344 (H) 08/03/2013   CHOLHDL 6.2 08/03/2013    Significant Diagnostic Results in last 30 days:  Dg Chest 2 View  Result Date: 05/13/2017 CLINICAL DATA:  Shortness of breath and edema. Discharged on 05/08/2017 with new acute renal failure and weight gain of 20 pounds steering hospitalization. Oxygen saturations are decreasing today. EXAM: CHEST  2 VIEW COMPARISON:  05/06/2017 FINDINGS: Cardiac enlargement with mild pulmonary vascular congestion. Perihilar infiltrates, greater on the right consistent with edema. Increasing opacity in the right lung base may represent asymmetrical edema or superimposed pneumonia. Small bilateral pleural effusions. No pneumothorax. Calcified and tortuous aorta. Linear atelectasis or fibrosis in the left mid lung. Degenerative changes in the spine. IMPRESSION: Cardiac enlargement with pulmonary vascular congestion and perihilar edema. Bilateral pleural effusions. Asymmetrical edema versus pneumonia in the right lung base. Electronically Signed   By: Lucienne Capers M.D.   On: 05/13/2017 21:05   Dg Chest 2 View  Result Date:  04/30/2017 CLINICAL DATA:  Weakness with hypoglycemia EXAM: CHEST  2 VIEW COMPARISON:  03/24/2015 FINDINGS: Cardiomegaly with aortic atherosclerosis. No thoracic aortic aneurysm. No acute pulmonary consolidation, effusion or pneumothorax. No acute nor suspicious osseous lesions. IMPRESSION: Stable cardiomegaly without acute pulmonary disease. Aortic atherosclerosis without aneurysm. Electronically Signed   By: Ashley Royalty M.D.   On: 04/30/2017 19:42   US Renal  Result Date: 05/02/2017 CLINICAL DATA:  Acute kidney injury. EXAM: RENAL / URINARY TRACT ULTRASOUND COMPLETE COMPARISON:  CT 03/17/2012 FINDINGS: Right Kidney: Length: 11.9 cm. Echogenicity within normal limits. No mass or hydronephrosis visualized. Previous right renal stone is not seen, may have passed in the interim or not well visualized sonographically. Left Kidney: Length: 13.1 cm. Echogenicity within normal limits. No mass or hydronephrosis visualized. Previous left renal stones are not seen, may have passed in the interim or not visualize sonographically. Bladder: Appears normal for degree of bladder distention. Neither ureteral jet was visualized. IMPRESSION: No hydronephrosis or obstructive uropathy. Unremarkable renal ultrasound. Electronically Signed   By: Jeb Levering M.D.   On: 05/02/2017 22:35   Dg Chest Port 1 View  Result Date: 05/06/2017 CLINICAL DATA:  Shortness of breath, history of diabetes, hypertension, acute renal injury, former smoker. EXAM: PORTABLE CHEST 1 VIEW COMPARISON:  PA and lateral chest x-ray  of April 30, 2017 FINDINGS: The lungs are well-expanded. The interstitial markings are coarse and more conspicuous today. There is no pleural effusion or pneumothorax. The cardiac silhouette is enlarged. The central pulmonary vascularity is prominent but stable. There is no definite cephalization. The bony thorax exhibits no acute abnormality. IMPRESSION: Chronic cardiomegaly without significant pulmonary vascular congestion.  Increased prominence of the pulmonary interstitium may reflect mild interstitial edema or less likely pneumonia. Thoracic aortic atherosclerosis. A PA and lateral chest x-ray would be useful. Electronically Signed   By: David  Martinique M.D.   On: 05/06/2017 08:03    Assessment/Plan  1 history of acute renal failure her renal function appears to be relatively stabilized with creatinine 1.8 T-spine receiving increased doses Demadex-will order nephrology follow-up and updated lab tomorrow.  Clinically she appears to be doing relatively well she is tired this morning but apparently receive therapy and did very well earlier today -at this point continue to monitor.  For hypokalemia will supplement this for 40 mEq of potassium a day and update another lab tomorrow.  #2 history of edema again x-ray did show pulmonary edema as well as possible pneumonia-we have increased her Demadex to 40 mg a day --she got her preordered dose of 20 earlier today Will give another 20 today to equal 40 and then continue 40 mg a day.  Supplement this with potassium monitor weights closely.  This will have to be watched closely also in light of her renal insufficiency.  #3 questionable pneumonia again she is on Avelox secondary to concerns or may be an element of aspiration here-and x-ray findings showing possible pneumonia we will continue the Avelox for now  Clinically she appears to be stable still quite weak will have to be monitored closely  I did speak with her speech therapists feels that her swallow function appears to be relatively intact-which is reassuring   Addendum later today I checked an outpatient after lunch and she was doing very well to his bright alert pretty much  all her lunch was in good humor she appears to be stable--physical exam was relatively unchanged   I also checked on her earlier this evening before left acute and she continued to be stable was bright alert physical exam was stable-she  did not have any complaints of shortness of breath-appears to be stable.  [id number] note greater than 40 minutes spent assessing patient-and reassessing patient during the day-reviewing her chart-reviewing her labs-discussing her status with nursing staff-and coordinating and formulating a plan of care-of note greater than 50% of time spent coordinating plan of care with input as noted above

## 2017-05-15 ENCOUNTER — Encounter (HOSPITAL_COMMUNITY)
Admission: AD | Admit: 2017-05-15 | Discharge: 2017-05-15 | Disposition: A | Discharge status: Home / Self Care | Payer: Medicare Other | Source: Skilled Nursing Facility | Attending: *Deleted | Admitting: *Deleted

## 2017-05-15 LAB — BASIC METABOLIC PANEL
Anion gap: 6 (ref 5–15)
BUN: 42 mg/dL — AB (ref 6–20)
CALCIUM: 8.7 mg/dL — AB (ref 8.9–10.3)
CO2: 31 mmol/L (ref 22–32)
CREATININE: 2.02 mg/dL — AB (ref 0.44–1.00)
Chloride: 102 mmol/L (ref 101–111)
GFR calc Af Amer: 29 mL/min — ABNORMAL LOW (ref >60)
GFR, EST NON AFRICAN AMERICAN: 25 mL/min — AB (ref >60)
GLUCOSE: 160 mg/dL — AB (ref 65–99)
POTASSIUM: 3.5 mmol/L (ref 3.5–5.1)
SODIUM: 139 mmol/L (ref 135–145)

## 2017-05-16 ENCOUNTER — Encounter (HOSPITAL_COMMUNITY)
Admission: RE | Admit: 2017-05-16 | Discharge: 2017-05-16 | Disposition: A | Discharge status: Home / Self Care | Payer: Medicare Other

## 2017-05-16 LAB — BASIC METABOLIC PANEL
Anion gap: 8 (ref 5–15)
BUN: 39 mg/dL — ABNORMAL HIGH (ref 6–20)
CO2: 32 mmol/L (ref 22–32)
CREATININE: 2.05 mg/dL — AB (ref 0.44–1.00)
Calcium: 8.7 mg/dL — ABNORMAL LOW (ref 8.9–10.3)
Chloride: 100 mmol/L — ABNORMAL LOW (ref 101–111)
GFR calc Af Amer: 29 mL/min — ABNORMAL LOW (ref >60)
GFR, EST NON AFRICAN AMERICAN: 25 mL/min — AB (ref >60)
Glucose, Bld: 139 mg/dL — ABNORMAL HIGH (ref 65–99)
Potassium: 3.8 mmol/L (ref 3.5–5.1)
SODIUM: 140 mmol/L (ref 135–145)

## 2017-05-17 ENCOUNTER — Encounter: Payer: Self-pay | Admitting: Internal Medicine

## 2017-05-17 ENCOUNTER — Non-Acute Institutional Stay (SKILLED_NURSING_FACILITY): Payer: Medicare Other | Admitting: Internal Medicine

## 2017-05-17 ENCOUNTER — Encounter (HOSPITAL_COMMUNITY)
Admission: RE | Admit: 2017-05-17 | Discharge: 2017-05-17 | Disposition: A | Discharge status: Home / Self Care | Payer: Medicare Other | Source: Skilled Nursing Facility | Attending: *Deleted | Admitting: *Deleted

## 2017-05-17 DIAGNOSIS — E1121 Type 2 diabetes mellitus with diabetic nephropathy: Secondary | ICD-10-CM | POA: Diagnosis not present

## 2017-05-17 DIAGNOSIS — N179 Acute kidney failure, unspecified: Secondary | ICD-10-CM | POA: Diagnosis not present

## 2017-05-17 DIAGNOSIS — N760 Acute vaginitis: Secondary | ICD-10-CM

## 2017-05-17 DIAGNOSIS — E162 Hypoglycemia, unspecified: Secondary | ICD-10-CM

## 2017-05-17 LAB — BASIC METABOLIC PANEL
Anion gap: 8 (ref 5–15)
BUN: 38 mg/dL — AB (ref 6–20)
CALCIUM: 9.1 mg/dL (ref 8.9–10.3)
CO2: 32 mmol/L (ref 22–32)
Chloride: 99 mmol/L — ABNORMAL LOW (ref 101–111)
Creatinine, Ser: 2.19 mg/dL — ABNORMAL HIGH (ref 0.44–1.00)
GFR calc Af Amer: 26 mL/min — ABNORMAL LOW (ref >60)
GFR, EST NON AFRICAN AMERICAN: 23 mL/min — AB (ref >60)
GLUCOSE: 194 mg/dL — AB (ref 65–99)
POTASSIUM: 3.6 mmol/L (ref 3.5–5.1)
Sodium: 139 mmol/L (ref 135–145)

## 2017-05-17 NOTE — Progress Notes (Signed)
Provider: Unice Cobble MD  Location:   [facility] Room Number: 126/P Place of Service:  SNF ([phone])  PCP: Celene Squibb, MD Patient Care Team: Celene Squibb, MD as PCP - General (Internal Medicine)  Extended Emergency Contact Information Primary Emergency Contact: Tew,Kimberly Address: [address], [place] Phone: [phone] Relation: Daughter Secondary Emergency Contact: Tia [place] Phone: [phone] Mobile Phone: [phone] Relation: Spouse  Code Status: Full Code Goals of Care: Advanced Directive information Advanced Directives 05/17/2017  Does Patient Have a Medical Advance Directive? Yes  Type of Advance Directive (No Data)  Does patient want to make changes to medical advance directive? No - Patient declined  Copy of [place] in Chart? -  Would patient like information on creating a medical advance directive? No - Patient declined  Pre-existing out of facility DNR order (yellow form or pink MOST form) -   Chief Complaint  Patient presents with  . New Admit To SNF  HPI: Patient is a 66 y.o. female seen today for admission to [facility] SNF after being hospitalized 7/20-7/28/18 with AKI in context of  profound hypoglycemia. She states the day of admission her glucose dropped progressively from 70-50-40 and finally measured 22. She states she couldn't stand up and was sweaty but had no other associated symptoms.  Acute kidney injury was documented with initial creatinine of 1.57 with subsequent peak of 1.83. This was treated with IV fluids.  With aggressive hydration the patient became hypoxic with acute hypoxic respiratory failure/acute on chronic diastolic congestive heart failure. The 05/13/17 chest x-ray was personally reviewed. This showed massive cardiomegaly with vascular accentuation, pulmonary edema with bilateral effusions. This was more pronounced on the right.   Some evidence of some subegmental atelectasis bilaterally. Radiographically pulmonaryt edema was suggested rather than pneumonia; but concomitant pneumonitis in the right lower lobe could not be ruled out.Marland Kitchen ECHO revealed baseline grade 1 diastolic dysfunction with well-maintained ejection fraction up to 65%.With IIV Lasix she diuresed 4300 mL of urine with improvement in her dyspnea. She was transitioned to oral torsemide. Despite clinical  improvement she did require 2 L of oxygen at discharge. The  Hypoglycemia was in the content of a combination of glipizide and insulin in the setting of acute renal failure. Glipizide was discontinued. Baseline Lantus was dosed at 12 units at discharge. Creatinine had peaked at 1.8 but dropped to 1.46. This was associated with 2100 mg of proteinuria over 24 hours. Most likely etiology was felt to be diabetes/hypertension/obesity related glomerular nephropathy. She does have a positive AMA to C4. Anti-Smith antibody and double-stranded DNA studies were negative or normal. ACE inhibitor/HCT was discontinued and Cardura, Coreg, hydralazine initiated. She has history of CVA and right-sided weakness which remained at baseline. She was discharged to SNF for PT/OT. Since discharge her creatinine has risen from 1.69 up to a high of 2.19 and GFR is 23. Serial glucoses have been controlled for the most part. The high since discharge is 216. A1c was 5.6% on 05/01/17, a nondiabetic value.  Past Medical History:  Diagnosis Date  . Abdominal aneurysm ([place])   . Bone spur   . Chronic back pain   . CVA (cerebral infarction)   . Diabetes mellitus without complication ([place])   . Hypertension   . Kidney stones   . Neuropathy   . Psoriasis   . Stroke ([place])   .  Wound, open, foot    Past Surgical History:  Procedure Laterality Date  . ABDOMINAL HYSTERECTOMY    . BLADDER SUSPENSION    . VESICOVAGINAL FISTULA CLOSURE W/ TAH      reports that she has quit smoking. She smoked  0.00 packs per day for 30.00 years. She has never used smokeless tobacco. She reports that she does not drink alcohol or use drugs. Social History   Social History  . Marital status: Married    Spouse name: N/A  . Number of children: N/A  . Years of education: N/A   Occupational History  . Not on file.   Social History Main Topics  . Smoking status: Former Smoker    Packs/day: 0.00    Years: 30.00  . Smokeless tobacco: Never Used  . Alcohol use No  . Drug use: No  . Sexual activity: Not on file   Other Topics Concern  . Not on file   Social History Narrative  . No narrative on file  Social history:She formerly  smoked < 1 pack a day.Non drinker. Family history: Father had diabetes. There is no cancer, heart attack, stroke in the family history.  Functional Status Survey: PT/OT have not been initiated yet   Family History  Problem Relation Age of Onset  . Aneurysm Mother   . Diabetes Mother   . Hypertension Mother     Health Maintenance  Topic Date Due  . OPHTHALMOLOGY EXAM  06/12/2017 (Originally 06/19/1961)  . URINE MICROALBUMIN  06/12/2017 (Originally 06/19/1961)  . FOOT EXAM  06/13/2017 (Originally 06/19/1961)  . MAMMOGRAM  06/13/2017 (Originally 06/19/2003)  . INFLUENZA VACCINE  09/11/2017 (Originally 05/12/2017)  . PAP SMEAR  09/11/2017 (Originally 06/18/1974)  . COLONOSCOPY  09/11/2017 (Originally 06/19/2003)  . TETANUS/TDAP  10/12/2017 (Originally 06/18/1972)  . HEMOGLOBIN A1C  11/01/2017  . PNEUMOCOCCAL POLYSACCHARIDE VACCINE (2) 08/04/2018  . Hepatitis C Screening  Completed  . HIV Screening  Completed    Allergies  Allergen Reactions  . Penicillins Itching    Has patient had a PCN reaction causing immediate rash, facial/tongue/throat swelling, SOB or lightheadedness with hypotension: Yes Has patient had a PCN reaction causing severe rash involving mucus membranes or skin necrosis: No Has patient had a PCN reaction that required hospitalization: No Has patient had  a PCN reaction occurring within the last 10 years: No If all of the above answers are "NO", then may proceed with Cephalosporin use.   . Clonidine Derivatives Other (See Comments)    Numbness  to tongue face, and arm  . Codeine Itching  . Hydrocodone-Acetaminophen Itching  . Shellfish Allergy Itching  . Sulfa Antibiotics Itching and Rash    Outpatient Encounter Prescriptions as of 05/17/2017  Medication Sig  . amLODipine (NORVASC) 10 MG tablet Take 10 mg by mouth daily.  Marland Kitchen aspirin 325 MG tablet Take 1 tablet (325 mg total) by mouth daily.  . blood glucose meter kit and supplies KIT Dispense based on patient and insurance preference. Use up to four times daily as directed. (FOR ICD-9 250.00, 250.01).  . carvedilol (COREG) 6.25 MG tablet Take 1 tablet (6.25 mg total) by mouth 2 (two) times daily with a meal.  . doxazosin (CARDURA) 4 MG tablet Take 1 tablet (4 mg total) by mouth daily.  . fenofibrate 160 MG tablet Take 160 mg by mouth daily.  . fexofenadine (ALLEGRA) 180 MG tablet Take 180 mg by mouth daily.  Marland Kitchen gabapentin (NEURONTIN) 100 MG capsule Take 2 capsules (200 mg total)  by mouth 3 (three) times daily.  . hydrALAZINE (APRESOLINE) 100 MG tablet Take 1 tablet (100 mg total) by mouth every 8 (eight) hours.  . insulin glargine (LANTUS) 100 UNIT/ML injection Inject 0.12 mLs (12 Units total) into the skin daily.  Marland Kitchen ketoconazole (NIZORAL) 2 % cream Apply to fungal rash under abdominal folds and bilateral breasts twice a day.  . Lactobacillus Rhamnosus, GG, ([employer] PROBIOTIC, LACTOBACILLUS,) CAPS Give 1 capsule by mouth for 10 days twice a day stop date 05/24/2017  . moxifloxacin (AVELOX) 400 MG tablet Take 400 mg by mouth daily at 8 pm.  . potassium chloride SA (K-DUR,KLOR-CON) 20 MEQ tablet Take 40 mEq by mouth daily.  . sitaGLIPtin (JANUVIA) 100 MG tablet Take 100 mg by mouth daily.  Marland Kitchen torsemide (DEMADEX) 20 MG tablet Take 40 mg by mouth daily.  . [DISCONTINUED] torsemide (DEMADEX) 20 MG  tablet Take 1 tablet (20 mg total) by mouth daily. (Patient taking differently: Take 40 mg by mouth daily. )   No facility-administered encounter medications on file as of 05/17/2017.    Review of Systems  Review of systems:She has dyspnea when she is supine. DOE has not been assessed as PT/OT has not been initiated as yet. Numbness and weakness in her hands and feet is chronic. She states she has some yeast vaginitis. 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 Constitutional: No fever,significant weight change, fatigue  Eyes: No redness, discharge, pain ENT/mouth: No nasal congestion,  purulent discharge, earache,change in hearing ,sore throat  Cardiovascular: No chest pain, palpitations, claudication  Respiratory: No cough, sputum production,hemoptysis, significant snoring,apnea  Gastrointestinal: No heartburn,dysphagia,abdominal pain, nausea / vomiting,rectal bleeding, melena,change in bowels Genitourinary: No dysuria,hematuria, pyuria,  incontinence, nocturia Musculoskeletal: No joint stiffness, joint swelling, weakness,pain Dermatologic: No rash, pruritus, change in appearance of skin Neurologic: No dizziness,headache,syncope, seizures Psychiatric: No significant anxiety , depression, insomnia, anorexia Endocrine: No change in hair/skin/ nails, excessive thirst, excessive hunger, excessive urination  Hematologic/lymphatic: No significant bruising, lymphadenopathy,abnormal bleeding Allergy/immunology: No itchy/ watery eyes, significant sneezing, urticaria, angioedema   There were no vitals filed for this visit. There is no height or weight on file to calculate BMI. Physical Exam :Physical exam:  Pertinent or positive findings: hair is fine. She has some faint hirsutism over the chin. This slight asymmetry of the nasolabial folds. Ptosis is present on the right. Multiple missng maxillary teeth. There is a grade 1/2-1  systolic murmur at left base. She has a soft right carotid bruit. Breath sounds are decreased. There are minimal rales at the left lower lobe.   she has 1+ pitting edema. Pedal pulses are not palpable. She is surprisingly strong as demonstrated to opposition  General appearance:Adequately nourished; no acute distress , increased work of breathing is present.   Lymphatic: No lymphadenopathy about the head, neck, axilla . Eyes: No conjunctival inflammation or lid edema is present. There is no scleral icterus. Ears:  External ear exam shows no significant lesions or deformities.   Nose:  External nasal examination shows no deformity or inflammation. Nasal mucosa are pink and moist without lesions ,exudates Oral exam: lips and gums are healthy appearing.There is no oropharyngeal erythema or exudate . Neck:  No thyromegaly, masses, tenderness noted.    Heart:  Normal rate and regular rhythm. S1 and S2 normal without gallop,click, rub .  Lungs: without wheezes, rhonchi, rubs. Abdomen:Bowel sounds are normal. Abdomen is soft and nontender with  no organomegaly, hernias,masses. GU: deferred  Extremities:  No cyanosis, clubbing  Neurologic exam : Strength equal  in upper & lower extremities Balance,Rhomberg,finger to nose testing could not be completed due to clinical state Skin: Warm & dry w/o tenting. No significant lesions or rash.  Labs reviewed:in toto  Recent Labs  04/30/17 1740  05/05/17 0655  05/07/17 0415 05/08/17 0559  05/14/17 0900 05/15/17 0430 05/16/17 0730  NA 138  < > 136  < > 138 137  < > 138 139 140  K 2.8*  < > 4.2  < > 3.9 3.6  < > 3.2* 3.5 3.8  CL 104  < > 106  < > 106 102  < > 98* 102 100*  CO2 23  < > 23  < > 24 26  < > 30 31 32  GLUCOSE 41*  < > 160*  < > 149* 172*  < > 142* 160* 139*  BUN 30*  < > 39*  < > 40* 41*  < > 37* 42* 39*  CREATININE 1.57*  < > 1.57*  < > 1.69* 1.69*  < > 1.80* 2.02* 2.05*  CALCIUM 9.0  < > 8.8*  < > 9.0 8.9  < > 8.8* 8.7* 8.7*  MG 2.3   --   --   --   --   --   --   --   --   --   PHOS  --   --  4.2  --  4.7* 4.8*  --   --   --   --   < > = values in this interval not displayed. Liver Function Tests:  Recent Labs  05/04/17 1419  05/08/17 0559 05/13/17 1520 05/13/17 2130  AST 23  --   --  16 17  ALT 16  --   --  11* 11*  ALKPHOS 39  --   --  44 43  BILITOT 1.0  --   --  1.0 0.8  PROT 6.7  --   --  6.5 6.6  ALBUMIN 3.1*  < > 3.0* 2.9* 3.1*  < > = values in this interval not displayed. No results for input(s): LIPASE, AMYLASE in the last 8760 hours. No results for input(s): AMMONIA in the last 8760 hours. CBC:  Recent Labs  04/30/17 1740  05/08/17 0559 05/13/17 1520 05/13/17 2130  WBC 6.7  < > 5.5 5.5 5.9  NEUTROABS 4.8  --   --  4.0 4.5  HGB 15.0  < > 11.9* 11.7* 11.6*  HCT 45.5  < > 36.9 36.1 36.2  MCV 87.8  < > 89.3 90.9 91.2  PLT 202  < > 174 166 169  < > = values in this interval not displayed. Cardiac Enzymes:  Recent Labs  04/30/17 1740  TROPONINI 0.04*   BNP: Invalid input(s): POCBNP Lab Results  Component Value Date   HGBA1C 5.6 05/01/2017   Lab Results  Component Value Date   TSH 3.612 05/01/2017   No results found for: VITAMINB12 No results found for: FOLATE No results found for: IRON, TIBC, FERRITIN  Assessment/Plan See clinical summary under each active problem in the Problem List with associated updated therapeutic plan

## 2017-05-17 NOTE — Assessment & Plan Note (Addendum)
05/01/17 mL: A1c 5.6 continue low-dose basal insulin; Glipizide contraindicated Her A1c goal would be less than 8%

## 2017-05-17 NOTE — Assessment & Plan Note (Signed)
Glipizide contraindicated because of recurrent risk of hypoglycemia

## 2017-05-17 NOTE — Patient Instructions (Addendum)
See assessment and plan under each diagnosis in the problem list and acutely for this visit Total time 51 minutes; greater than 50% of the visit spent counseling patient and coordinating care for problems addressed at this encounter  

## 2017-05-17 NOTE — Assessment & Plan Note (Addendum)
Current creatinine 2.19, GFR 23, rising serially Gabapentin, Januvia and any other potentially nephrotoxic drugs will be adjusted or D/Ced. Specifically gabapentin to 200 mg once daily and Januvia to 25 mg once daily. These medications can be reassessed once renal function stable.BMET will be rechecked 8/9.

## 2017-05-18 ENCOUNTER — Encounter: Payer: Self-pay | Admitting: Internal Medicine

## 2017-05-20 ENCOUNTER — Encounter (HOSPITAL_COMMUNITY)
Admission: RE | Admit: 2017-05-20 | Discharge: 2017-05-20 | Disposition: A | Discharge status: Home / Self Care | Payer: Medicare Other | Source: Skilled Nursing Facility | Attending: Internal Medicine | Admitting: Internal Medicine

## 2017-05-20 LAB — BASIC METABOLIC PANEL
Anion gap: 9 (ref 5–15)
BUN: 36 mg/dL — AB (ref 6–20)
CO2: 33 mmol/L — ABNORMAL HIGH (ref 22–32)
CREATININE: 2.21 mg/dL — AB (ref 0.44–1.00)
Calcium: 8.6 mg/dL — ABNORMAL LOW (ref 8.9–10.3)
Chloride: 96 mmol/L — ABNORMAL LOW (ref 101–111)
GFR calc Af Amer: 26 mL/min — ABNORMAL LOW (ref >60)
GFR, EST NON AFRICAN AMERICAN: 22 mL/min — AB (ref >60)
GLUCOSE: 114 mg/dL — AB (ref 65–99)
POTASSIUM: 4 mmol/L (ref 3.5–5.1)
SODIUM: 138 mmol/L (ref 135–145)

## 2017-05-21 ENCOUNTER — Non-Acute Institutional Stay (SKILLED_NURSING_FACILITY): Payer: Medicare Other | Admitting: Internal Medicine

## 2017-05-21 ENCOUNTER — Encounter: Payer: Self-pay | Admitting: Internal Medicine

## 2017-05-21 DIAGNOSIS — I5030 Unspecified diastolic (congestive) heart failure: Secondary | ICD-10-CM | POA: Diagnosis not present

## 2017-05-21 DIAGNOSIS — E1121 Type 2 diabetes mellitus with diabetic nephropathy: Secondary | ICD-10-CM | POA: Diagnosis not present

## 2017-05-21 DIAGNOSIS — R110 Nausea: Secondary | ICD-10-CM | POA: Diagnosis not present

## 2017-05-21 DIAGNOSIS — N179 Acute kidney failure, unspecified: Secondary | ICD-10-CM | POA: Diagnosis not present

## 2017-05-21 NOTE — Progress Notes (Signed)
Location:   [facility] Room Number: 126/P Place of Service:  SNF [phone]) Provider:  Cory Roughen, MD  Patient Care Team: Celene Squibb, MD as PCP - General (Internal Medicine)  Extended Emergency Contact Information Primary Emergency Contact: Tew,Kimberly Address: [address], [place] Phone: [phone] Relation: Daughter Secondary Emergency Contact: Tia Alert [place] Phone: [phone] Mobile Phone: [phone] Relation: Spouse  Code Status:  Full Code Goals of care: Advanced Directive information Advanced Directives 05/21/2017  Does Patient Have a Medical Advance Directive? Yes  Type of Advance Directive (No Data)  Does patient want to make changes to medical advance directive? No - Patient declined  Copy of [place] in Chart? -  Would patient like information on creating a medical advance directive? No - Patient declined  Pre-existing out of facility DNR order (yellow form or pink MOST form) -     Chief Complaint  Patient presents with  . Acute Visit    F/U Renal Insufficiency  As well as nausea  HPI:  Pt is a 66 y.o. female seen today for an acute visit for follow-up renal insufficiency also follow-up of complaints of nausea.  Patient was admitted to the hospital with hypoglycemia and acute renal failure complicated with a history of diastolic CHF.  Medication doesn't from a she is now on Lantus and Januvia this appears to have stabilized her blood sugars now running largely in the 100s range.  Her Januvia was decreased by Dr. Linna Darner secondary renal insufficiency.  In regards to her renal issues creatinine on discharge was in the high ones this is gradually risen and is now 2.21 on lab done yesterday-this is complicated with a history of diastolic CHF and edema-she came in with orders for Demadex 20 mg a day but had significant edema and shortness  of breath-Demadex was increased to 40 mg a day  Her weight has tended to go down slightly and clinical status appears to be improved in this regard  She says she has a decreased appetite she could've nausea and Zofran has just been instituted.  There has been no vomiting she does not complain of any difficulty swallowing or abdominal discomfort.       Past Medical History:  Diagnosis Date  . Abdominal aneurysm ([place])   . Bone spur   . Chronic back pain   . CVA (cerebral infarction)   . Diabetes mellitus without complication ([place])   . Hypertension   . Kidney stones   . Neuropathy   . Psoriasis   . Stroke ([place])   . Wound, open, foot    Past Surgical History:  Procedure Laterality Date  . ABDOMINAL HYSTERECTOMY    . BLADDER SUSPENSION    . VESICOVAGINAL FISTULA CLOSURE W/ TAH      Allergies  Allergen Reactions  . Glipizide     Severe hypoglycemia (22!)  . Penicillins Itching    Has patient had a PCN reaction causing immediate rash, facial/tongue/throat swelling, SOB or lightheadedness with hypotension: Yes Has patient had a PCN reaction causing severe rash involving mucus membranes or skin necrosis: No Has patient had a PCN reaction that required hospitalization: No Has patient had a PCN reaction occurring within the last 10 years: No If all of the above answers are "NO", then may proceed with Cephalosporin use.   . Clonidine Derivatives Other (See  Comments)    Numbness  to tongue face, and arm  . Codeine Itching  . Hydrocodone-Acetaminophen Itching  . Shellfish Allergy Itching  . Sulfa Antibiotics Itching and Rash    Outpatient Encounter Prescriptions as of 05/21/2017  Medication Sig  . amLODipine (NORVASC) 10 MG tablet Take 10 mg by mouth daily.  Marland Kitchen aspirin 325 MG tablet Take 1 tablet (325 mg total) by mouth daily.  . blood glucose meter kit and supplies KIT Dispense based on patient and insurance preference. Use up to four times daily as directed. (FOR ICD-9  250.00, 250.01).  . carvedilol (COREG) 6.25 MG tablet Take 1 tablet (6.25 mg total) by mouth 2 (two) times daily with a meal.  . doxazosin (CARDURA) 4 MG tablet Take 1 tablet (4 mg total) by mouth daily.  . fenofibrate 160 MG tablet Take 160 mg by mouth daily.  . fexofenadine (ALLEGRA) 180 MG tablet Take 180 mg by mouth daily.  Marland Kitchen gabapentin (NEURONTIN) 100 MG capsule Take 100 mg by mouth at bedtime.  . hydrALAZINE (APRESOLINE) 100 MG tablet Take 1 tablet (100 mg total) by mouth every 8 (eight) hours.  . insulin glargine (LANTUS) 100 UNIT/ML injection Inject 0.12 mLs (12 Units total) into the skin daily.  Marland Kitchen ketoconazole (NIZORAL) 2 % cream Apply to fungal rash under abdominal folds and bilateral breasts twice a day.  . Lactobacillus Rhamnosus, GG, ([employer] PROBIOTIC, LACTOBACILLUS,) CAPS Give 1 capsule by mouth for 10 days twice a day stop date 05/24/2017  . ondansetron (ZOFRAN) 4 MG tablet Take 4 mg by mouth every 8 (eight) hours as needed for nausea or vomiting.  . potassium chloride SA (K-DUR,KLOR-CON) 20 MEQ tablet Take 40 mEq by mouth daily.  . sitaGLIPtin (JANUVIA) 25 MG tablet Take 25 mg by mouth daily.  Marland Kitchen torsemide (DEMADEX) 20 MG tablet Take 40 mg by mouth daily.  . [DISCONTINUED] gabapentin (NEURONTIN) 100 MG capsule Take 2 capsules (200 mg total) by mouth 3 (three) times daily. (Patient taking differently: Take 200 mg by mouth at bedtime. )  . [DISCONTINUED] moxifloxacin (AVELOX) 400 MG tablet Take 400 mg by mouth daily at 8 pm.  . [DISCONTINUED] sitaGLIPtin (JANUVIA) 100 MG tablet Take 100 mg by mouth daily.   No facility-administered encounter medications on file as of 05/21/2017.     Review of Systems   General she is not complaining fever or chills says her appetite is not very good because of the nausea  Skin does not complain or rashes or itching or diaphoresis.  Head ears eyes nose mouth and throat does not complain of difficulty swallowing currently has a history of left eye  blindness and blurriness of her right eye at times.  Respiratory does not complain of shortness breath or cough.  Cardiac does not complaining of any chest pain continues with fairly significant lower extremity edema but this appears to have stabilized.  GI is not complaining of any abdominal pain--or vomiting but does complain of nausea  Musculoskeletal has weakness but does not complaining of joint pain  Neurologic is not complaining of any dizziness headache or syncope.--She does have weakness  Psych does not complaining of overt anxiety or depression  Immunization History  Administered Date(s) Administered  . Influenza,inj,Quad PF,36+ Mos 08/04/2013  . Pneumococcal Polysaccharide-23 08/04/2013   Pertinent  Health Maintenance Due  Topic Date Due  . OPHTHALMOLOGY EXAM  06/12/2017 (Originally 06/19/1961)  . URINE MICROALBUMIN  06/12/2017 (Originally 06/19/1961)  . FOOT EXAM  06/13/2017 (Originally 06/19/1961)  .  MAMMOGRAM  06/13/2017 (Originally 06/19/2003)  . INFLUENZA VACCINE  09/11/2017 (Originally 05/12/2017)  . PAP SMEAR  09/11/2017 (Originally 06/18/1974)  . COLONOSCOPY  09/11/2017 (Originally 06/19/2003)  . HEMOGLOBIN A1C  11/01/2017   No flowsheet data found. Functional Status Survey:   Temperature is 97.8 pulse 80 respirations 18 blood pressure taken manually 140/80 weight is 218.4 appears to be slightly going down   Physical Exam   in general this is a pleasant middle-aged female in no distress she is sitting comfortably in her wheelchair   Her skin is warm and dry   Eyes right eye visual acuity appears grossly intact she does have a history of left eye blindness sclera and conjunctiva are clear.  Her oropharynx is clear mucous membranes moist  Chest she has shallow air entry but I could not really appreciate overt congestion there is no labored breathing. She appears to be comfortable she has oxygen applied  Heartis regular rate and rhythm without murmur  gallop or rub she continues with 2+ lower extremity edema bilaterally this is cool to touch non-erythematous  Abdomen is obese soft nontender mildly hypoactive bowel sounds  Muscle skeletal is able to move all extremities 4 grip strength strong bilaterally   Neurologic appears grossly intact her speech is clear could not really appreciate any gross lateralizing findings although she does have a listed history of right-sided weakness he does have some weakness of her lower extremities .  Psych she is alert and oriented pleasant and appropriate.     Labs reviewed:  Recent Labs  04/30/17 1740  05/05/17 0655  05/07/17 0415 05/08/17 0559  05/16/17 0730 05/17/17 1030 05/20/17 0435  NA 138  < > 136  < > 138 137  < > 140 139 138  K 2.8*  < > 4.2  < > 3.9 3.6  < > 3.8 3.6 4.0  CL 104  < > 106  < > 106 102  < > 100* 99* 96*  CO2 23  < > 23  < > 24 26  < > 32 32 33*  GLUCOSE 41*  < > 160*  < > 149* 172*  < > 139* 194* 114*  BUN 30*  < > 39*  < > 40* 41*  < > 39* 38* 36*  CREATININE 1.57*  < > 1.57*  < > 1.69* 1.69*  < > 2.05* 2.19* 2.21*  CALCIUM 9.0  < > 8.8*  < > 9.0 8.9  < > 8.7* 9.1 8.6*  MG 2.3  --   --   --   --   --   --   --   --   --   PHOS  --   --  4.2  --  4.7* 4.8*  --   --   --   --   < > = values in this interval not displayed.  Recent Labs  05/04/17 1419  05/08/17 0559 05/13/17 1520 05/13/17 2130  AST 23  --   --  16 17  ALT 16  --   --  11* 11*  ALKPHOS 39  --   --  44 43  BILITOT 1.0  --   --  1.0 0.8  PROT 6.7  --   --  6.5 6.6  ALBUMIN 3.1*  < > 3.0* 2.9* 3.1*  < > = values in this interval not displayed.  Recent Labs  04/30/17 1740  05/08/17 0559 05/13/17 1520 05/13/17 2130  WBC 6.7  < >  5.5 5.5 5.9  NEUTROABS 4.8  --   --  4.0 4.5  HGB 15.0  < > 11.9* 11.7* 11.6*  HCT 45.5  < > 36.9 36.1 36.2  MCV 87.8  < > 89.3 90.9 91.2  PLT 202  < > 174 166 169  < > = values in this interval not displayed. Lab Results  Component Value Date   TSH  3.612 05/01/2017   Lab Results  Component Value Date   HGBA1C 5.6 05/01/2017   Lab Results  Component Value Date   CHOL 179 08/03/2013   HDL 29 (L) 08/03/2013   LDLCALC 81 08/03/2013   TRIG 344 (H) 08/03/2013   CHOLHDL 6.2 08/03/2013    Significant Diagnostic Results in last 30 days:  Dg Chest 2 View  Result Date: 05/13/2017 CLINICAL DATA:  Shortness of breath and edema. Discharged on 05/08/2017 with new acute renal failure and weight gain of 20 pounds steering hospitalization. Oxygen saturations are decreasing today. EXAM: CHEST  2 VIEW COMPARISON:  05/06/2017 FINDINGS: Cardiac enlargement with mild pulmonary vascular congestion. Perihilar infiltrates, greater on the right consistent with edema. Increasing opacity in the right lung base may represent asymmetrical edema or superimposed pneumonia. Small bilateral pleural effusions. No pneumothorax. Calcified and tortuous aorta. Linear atelectasis or fibrosis in the left mid lung. Degenerative changes in the spine. IMPRESSION: Cardiac enlargement with pulmonary vascular congestion and perihilar edema. Bilateral pleural effusions. Asymmetrical edema versus pneumonia in the right lung base. Electronically Signed   By: Lucienne Capers M.D.   On: 05/13/2017 21:05   Dg Chest 2 View  Result Date: 04/30/2017 CLINICAL DATA:  Weakness with hypoglycemia EXAM: CHEST  2 VIEW COMPARISON:  03/24/2015 FINDINGS: Cardiomegaly with aortic atherosclerosis. No thoracic aortic aneurysm. No acute pulmonary consolidation, effusion or pneumothorax. No acute nor suspicious osseous lesions. IMPRESSION: Stable cardiomegaly without acute pulmonary disease. Aortic atherosclerosis without aneurysm. Electronically Signed   By: Ashley Royalty M.D.   On: 04/30/2017 19:42   US Renal  Result Date: 05/02/2017 CLINICAL DATA:  Acute kidney injury. EXAM: RENAL / URINARY TRACT ULTRASOUND COMPLETE COMPARISON:  CT 03/17/2012 FINDINGS: Right Kidney: Length: 11.9 cm. Echogenicity within  normal limits. No mass or hydronephrosis visualized. Previous right renal stone is not seen, may have passed in the interim or not well visualized sonographically. Left Kidney: Length: 13.1 cm. Echogenicity within normal limits. No mass or hydronephrosis visualized. Previous left renal stones are not seen, may have passed in the interim or not visualize sonographically. Bladder: Appears normal for degree of bladder distention. Neither ureteral jet was visualized. IMPRESSION: No hydronephrosis or obstructive uropathy. Unremarkable renal ultrasound. Electronically Signed   By: Jeb Levering M.D.   On: 05/02/2017 22:35   Dg Chest Port 1 View  Result Date: 05/06/2017 CLINICAL DATA:  Shortness of breath, history of diabetes, hypertension, acute renal injury, former smoker. EXAM: PORTABLE CHEST 1 VIEW COMPARISON:  PA and lateral chest x-ray of April 30, 2017 FINDINGS: The lungs are well-expanded. The interstitial markings are coarse and more conspicuous today. There is no pleural effusion or pneumothorax. The cardiac silhouette is enlarged. The central pulmonary vascularity is prominent but stable. There is no definite cephalization. The bony thorax exhibits no acute abnormality. IMPRESSION: Chronic cardiomegaly without significant pulmonary vascular congestion. Increased prominence of the pulmonary interstitium may reflect mild interstitial edema or less likely pneumonia. Thoracic aortic atherosclerosis. A PA and lateral chest x-ray would be useful. Electronically Signed   By: David  Martinique M.D.   On: 05/06/2017  08:03    Assessment/Plan  1 renal insufficiency creatinine is slowly rising but not precipitously so-with her numerous issue with diastolic CHF and edema and weight gain clinically she appears to be improving in that regard-- will maintain Demadex at current dose and recheck a BMP on Monday, August 13-continue to monitor clinical status closely breathing appears to have improved again this is  challenging situation balancing her renal function and edema.  #2-history of nausea-will start a proton pump inhibitor Prilosec 20 mg a day for concerns possibly GERD may be contributing to this also will check a urinalysis and culture-this was discussed with Dr. Lyndel Safe via phone as well as. Continues on when necessary Zofran as well  #3 history of diastolic CHF again her weight appears to be slowly trending down on Demadex 40 mg a day complicated again with her renal insufficiency.  #4 history of hypertension her ACE inhibitor and hydrochlorothiazide were discontinued in the hospital because of her renal issues-she continues on Coreg hydralazine Norvasc and Demadex at this point will monitor occasionally will have somewhat elevated systolics --  161/09 manually today.  #5 history of diabetes she is on Lantus Januvia was decreased earlier this week secondary to renal concerns-blood sugars appear to be stable however largely in the 100s it was 120 this morning 140 yesterday this appears to be the norm the last few days later day blood sugars are largely in the mid to higher 100s at this point will monitor.  [id number]

## 2017-05-23 ENCOUNTER — Encounter (HOSPITAL_COMMUNITY)
Admission: RE | Admit: 2017-05-23 | Discharge: 2017-05-23 | Disposition: A | Discharge status: Home / Self Care | Payer: Medicare Other | Source: Skilled Nursing Facility | Attending: *Deleted | Admitting: *Deleted

## 2017-05-23 LAB — URINALYSIS, ROUTINE W REFLEX MICROSCOPIC
BILIRUBIN URINE: NEGATIVE
Glucose, UA: NEGATIVE mg/dL
Hgb urine dipstick: NEGATIVE
KETONES UR: NEGATIVE mg/dL
Nitrite: NEGATIVE
Protein, ur: 100 mg/dL — AB
Specific Gravity, Urine: 1.01 (ref 1.005–1.030)
pH: 6 (ref 5.0–8.0)

## 2017-05-24 ENCOUNTER — Encounter: Payer: Self-pay | Admitting: Internal Medicine

## 2017-05-24 ENCOUNTER — Non-Acute Institutional Stay (SKILLED_NURSING_FACILITY): Payer: Medicare Other | Admitting: Internal Medicine

## 2017-05-24 DIAGNOSIS — R110 Nausea: Secondary | ICD-10-CM

## 2017-05-24 DIAGNOSIS — E1121 Type 2 diabetes mellitus with diabetic nephropathy: Secondary | ICD-10-CM

## 2017-05-24 DIAGNOSIS — N390 Urinary tract infection, site not specified: Secondary | ICD-10-CM | POA: Diagnosis not present

## 2017-05-24 DIAGNOSIS — I5030 Unspecified diastolic (congestive) heart failure: Secondary | ICD-10-CM

## 2017-05-24 DIAGNOSIS — N289 Disorder of kidney and ureter, unspecified: Secondary | ICD-10-CM | POA: Diagnosis not present

## 2017-05-24 NOTE — Progress Notes (Signed)
Location:   [place] Room Number: 126/P Place of Service:  SNF [phone]) Provider:  Cory Roughen, MD  Patient Care Team: Celene Squibb, MD as PCP - General (Internal Medicine)  Extended Emergency Contact Information Primary Emergency Contact: Tew,Kimberly Address: [address], [place] Phone: [phone] Relation: Daughter Secondary Emergency Contact: Tia Alert [place] Phone: [phone] Mobile Phone: [phone] Relation: Spouse  Code Status:  Full Code Goals of care: Advanced Directive information Advanced Directives 05/24/2017  Does Patient Have a Medical Advance Directive? Yes  Type of Advance Directive (No Data)  Does patient want to make changes to medical advance directive? No - Patient declined  Copy of [place] in Chart? -  Would patient like information on creating a medical advance directive? No - Patient declined  Pre-existing out of facility DNR order (yellow form or pink MOST form) -     Chief Complaint  Patient presents with  . Acute Visit    UTI  Also follow-up renal insufficiency  HPI:  Pt is a 66 y.o. female seen today for an acute visit for follow-up of UTI-as well as renal insufficiency.  Patient was admitted to the hospital with hypoglycemia and acute renal failure complicated with a history of diastolic CHF.  Medication changes were made she is now on Lantus and Januvia this appears to have stabilized her blood sugars now running largely in the 100s range.  Her Januvia was decreased by Dr. Linna Darner secondary renal insufficiency.  In regards to her renal issues creatinine on discharge was in the high ones this has slightly risen during her stay here until O2 sat was 2.21 with a BUN of 36 on lab done on August 9.  Her weight appears to be down somewhat she does have a significant history of diastolic CHF she is on Demadex 40 mg  daily. This was increased secondary to concerns of increased edema  This appears to be stabilized although she is very fragile in this regard.  She also complained of nausea last week we did start her on Prilosec secondary to concerns this may be GERD related she says this has helped somewhat.  We also ordered a urine at that time which has come back positive for greater than 100,000 colonies of Escherichia coli.  She continues on Zofran when necessary as well     Past Medical History:  Diagnosis Date  . Abdominal aneurysm ([place])   . Bone spur   . Chronic back pain   . CVA (cerebral infarction)   . Diabetes mellitus without complication ([place])   . Hypertension   . Kidney stones   . Neuropathy   . Psoriasis   . Stroke ([place])   . Wound, open, foot    Past Surgical History:  Procedure Laterality Date  . ABDOMINAL HYSTERECTOMY    . BLADDER SUSPENSION    . VESICOVAGINAL FISTULA CLOSURE W/ TAH      Allergies  Allergen Reactions  . Glipizide     Severe hypoglycemia (22!)  . Penicillins Itching    Has patient had a PCN reaction causing immediate rash, facial/tongue/throat swelling, SOB or lightheadedness with hypotension: Yes Has patient had a PCN reaction causing severe rash involving mucus membranes or skin necrosis: No Has patient had a PCN reaction that required hospitalization: No Has patient had a PCN reaction occurring within the last  10 years: No If all of the above answers are "NO", then may proceed with Cephalosporin use.   . Clonidine Derivatives Other (See Comments)    Numbness  to tongue face, and arm  . Codeine Itching  . Hydrocodone-Acetaminophen Itching  . Shellfish Allergy Itching  . Sulfa Antibiotics Itching and Rash    Outpatient Encounter Prescriptions as of 05/24/2017  Medication Sig  . amLODipine (NORVASC) 10 MG tablet Take 10 mg by mouth daily.  Marland Kitchen aspirin 325 MG tablet Take 1 tablet (325 mg total) by mouth daily.  . blood glucose meter kit and  supplies KIT Dispense based on patient and insurance preference. Use up to four times daily as directed. (FOR ICD-9 250.00, 250.01).  . carvedilol (COREG) 6.25 MG tablet Take 1 tablet (6.25 mg total) by mouth 2 (two) times daily with a meal.  . doxazosin (CARDURA) 4 MG tablet Take 1 tablet (4 mg total) by mouth daily.  . fenofibrate 160 MG tablet Take 160 mg by mouth daily.  . fexofenadine (ALLEGRA) 180 MG tablet Take 180 mg by mouth daily.  Marland Kitchen gabapentin (NEURONTIN) 100 MG capsule Take 200 mg by mouth at bedtime.   . hydrALAZINE (APRESOLINE) 100 MG tablet Take 1 tablet (100 mg total) by mouth every 8 (eight) hours.  . insulin glargine (LANTUS) 100 UNIT/ML injection Inject 0.12 mLs (12 Units total) into the skin daily.  Marland Kitchen ketoconazole (NIZORAL) 2 % cream Apply to fungal rash under abdominal folds and bilateral breasts twice a day.  . Lactobacillus Rhamnosus, GG, ([employer] PROBIOTIC, LACTOBACILLUS,) CAPS Give 1 capsule by mouth for 10 days twice a day stop date 05/24/2017  . omeprazole (PRILOSEC) 20 MG capsule Take 20 mg by mouth daily.  . ondansetron (ZOFRAN) 4 MG tablet Take 4 mg by mouth every 8 (eight) hours as needed for nausea or vomiting.  . potassium chloride SA (K-DUR,KLOR-CON) 20 MEQ tablet Take 40 mEq by mouth daily.  . sitaGLIPtin (JANUVIA) 25 MG tablet Take 25 mg by mouth daily.  Marland Kitchen torsemide (DEMADEX) 20 MG tablet Take 40 mg by mouth daily.   No facility-administered encounter medications on file as of 05/24/2017.     Review of Systems  General she is not complaining fever or chills says her appetite is not very good because of the nausea but apparently has improved some since last week  Skin does not complain or rashes or itching or diaphoresis.  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  Respiratory does not complain of shortness breath or  cough.  Cardiac does not complaining of any chest pain continues with fairly significant lower extremity edema but this appears to have stabilized.  GI is not complaining of any abdominal pain--or vomiting but has complained of nausea  GU does have a UTI but does not really complain of overt dysuria at this time  Musculoskeletal has weakness but does not complaining of joint pain  Neurologic is not complaining of any dizziness headache or syncope.--She does have weakness  Psych does not complaining of overt anxiety or depression   Immunization History  Administered Date(s) Administered  . Influenza,inj,Quad PF,36+ Mos 08/04/2013  . Pneumococcal Polysaccharide-23 08/04/2013   Pertinent  Health Maintenance Due  Topic Date Due  . OPHTHALMOLOGY EXAM  06/12/2017 (Originally 06/19/1961)  . URINE MICROALBUMIN  06/12/2017 (Originally 06/19/1961)  . FOOT EXAM  06/13/2017 (Originally 06/19/1961)  .  MAMMOGRAM  06/13/2017 (Originally 06/19/2003)  . INFLUENZA VACCINE  09/11/2017 (Originally 05/12/2017)  . PAP SMEAR  09/11/2017 (Originally 06/18/1974)  . COLONOSCOPY  09/11/2017 (Originally 06/19/2003)  . HEMOGLOBIN A1C  11/01/2017   No flowsheet data found. Functional Status Survey:    Vitals:   05/24/17 1553  BP: (!) 178/74  Pulse: 80  Resp: 16  Temp: 98.2 F (36.8 C)  TempSrc: Oral  Of note manual blood pressure taken was 140/70-I recent blood pressures ranging from the 622Q to 333L systolically I do not see consistent elevations-weight appears to be trending down at 215.2 pounds this is down about 3 pounds in recent days  Physical Exam   in general this is a pleasant middle-aged female in no distress she is  sitting comfortably in bed-about to eat her supper  Her skin is warm and dry   Eyes right eye visual acuity appears grossly intact she does have a history of left eye blindness sclera and conjunctiva are clear.  Her oropharynx is clear mucous membranes moist  Chest she  has shallow air entry but I could not really appreciate overt congestion there is no labored breathing.She appears to be comfortable she has oxygen applied  Heartis regular rate and rhythm without murmur gallop or rub she continues with 2+ lower extremity edema bilaterally this is cool to touch non-erythematous Appear slightly improved from my last exam late last week  Abdomen is obese soft nontender mildly hypoactive bowel sounds  GU could not really specifically appreciate suprapubic tenderness  Muscle skeletal is able to move all extremities 4 grip strength strong bilaterally   Neurologic appears grossly intact her speech is clear could not really appreciate any gross lateralizing findings although she does have a listed history of right-sided weakness he does have some weakness of her lower extremities--has suspected more debility related .  Psych she is alert and oriented pleasant and appropriate.    Labs reviewed:  Recent Labs  04/30/17 1740  05/05/17 0655  05/07/17 0415 05/08/17 0559  05/16/17 0730 05/17/17 1030 05/20/17 0435  NA 138  < > 136  < > 138 137  < > 140 139 138  K 2.8*  < > 4.2  < > 3.9 3.6  < > 3.8 3.6 4.0  CL 104  < > 106  < > 106 102  < > 100* 99* 96*  CO2 23  < > 23  < > 24 26  < > 32 32 33*  GLUCOSE 41*  < > 160*  < > 149* 172*  < > 139* 194* 114*  BUN 30*  < > 39*  < > 40* 41*  < > 39* 38* 36*  CREATININE 1.57*  < > 1.57*  < > 1.69* 1.69*  < > 2.05* 2.19* 2.21*  CALCIUM 9.0  < > 8.8*  < > 9.0 8.9  < > 8.7* 9.1 8.6*  MG 2.3  --   --   --   --   --   --   --   --   --   PHOS  --   --  4.2  --  4.7* 4.8*  --   --   --   --   < > = values in this interval not displayed.  Recent Labs  05/04/17 1419  05/08/17 0559 05/13/17 1520 05/13/17 2130  AST 23  --   --  16 17  ALT 16  --   --  11* 11*  ALKPHOS  39  --   --  44 43  BILITOT 1.0  --   --  1.0 0.8  PROT 6.7  --   --  6.5 6.6  ALBUMIN 3.1*  < > 3.0* 2.9* 3.1*  < > = values in this  interval not displayed.  Recent Labs  04/30/17 1740  05/08/17 0559 05/13/17 1520 05/13/17 2130  WBC 6.7  < > 5.5 5.5 5.9  NEUTROABS 4.8  --   --  4.0 4.5  HGB 15.0  < > 11.9* 11.7* 11.6*  HCT 45.5  < > 36.9 36.1 36.2  MCV 87.8  < > 89.3 90.9 91.2  PLT 202  < > 174 166 169  < > = values in this interval not displayed. Lab Results  Component Value Date   TSH 3.612 05/01/2017   Lab Results  Component Value Date   HGBA1C 5.6 05/01/2017   Lab Results  Component Value Date   CHOL 179 08/03/2013   HDL 29 (L) 08/03/2013   LDLCALC 81 08/03/2013   TRIG 344 (H) 08/03/2013   CHOLHDL 6.2 08/03/2013    Significant Diagnostic Results in last 30 days:  Dg Chest 2 View  Result Date: 05/13/2017 CLINICAL DATA:  Shortness of breath and edema. Discharged on 05/08/2017 with new acute renal failure and weight gain of 20 pounds steering hospitalization. Oxygen saturations are decreasing today. EXAM: CHEST  2 VIEW COMPARISON:  05/06/2017 FINDINGS: Cardiac enlargement with mild pulmonary vascular congestion. Perihilar infiltrates, greater on the right consistent with edema. Increasing opacity in the right lung base may represent asymmetrical edema or superimposed pneumonia. Small bilateral pleural effusions. No pneumothorax. Calcified and tortuous aorta. Linear atelectasis or fibrosis in the left mid lung. Degenerative changes in the spine. IMPRESSION: Cardiac enlargement with pulmonary vascular congestion and perihilar edema. Bilateral pleural effusions. Asymmetrical edema versus pneumonia in the right lung base. Electronically Signed   By: Lucienne Capers M.D.   On: 05/13/2017 21:05   Dg Chest 2 View  Result Date: 04/30/2017 CLINICAL DATA:  Weakness with hypoglycemia EXAM: CHEST  2 VIEW COMPARISON:  03/24/2015 FINDINGS: Cardiomegaly with aortic atherosclerosis. No thoracic aortic aneurysm. No acute pulmonary consolidation, effusion or pneumothorax. No acute nor suspicious osseous lesions. IMPRESSION:  Stable cardiomegaly without acute pulmonary disease. Aortic atherosclerosis without aneurysm. Electronically Signed   By: Ashley Royalty M.D.   On: 04/30/2017 19:42   US Renal  Result Date: 05/02/2017 CLINICAL DATA:  Acute kidney injury. EXAM: RENAL / URINARY TRACT ULTRASOUND COMPLETE COMPARISON:  CT 03/17/2012 FINDINGS: Right Kidney: Length: 11.9 cm. Echogenicity within normal limits. No mass or hydronephrosis visualized. Previous right renal stone is not seen, may have passed in the interim or not well visualized sonographically. Left Kidney: Length: 13.1 cm. Echogenicity within normal limits. No mass or hydronephrosis visualized. Previous left renal stones are not seen, may have passed in the interim or not visualize sonographically. Bladder: Appears normal for degree of bladder distention. Neither ureteral jet was visualized. IMPRESSION: No hydronephrosis or obstructive uropathy. Unremarkable renal ultrasound. Electronically Signed   By: Jeb Levering M.D.   On: 05/02/2017 22:35   Dg Chest Port 1 View  Result Date: 05/06/2017 CLINICAL DATA:  Shortness of breath, history of diabetes, hypertension, acute renal injury, former smoker. EXAM: PORTABLE CHEST 1 VIEW COMPARISON:  PA and lateral chest x-ray of April 30, 2017 FINDINGS: The lungs are well-expanded. The interstitial markings are coarse and more conspicuous today. There is no pleural effusion or pneumothorax. The cardiac silhouette is  enlarged. The central pulmonary vascularity is prominent but stable. There is no definite cephalization. The bony thorax exhibits no acute abnormality. IMPRESSION: Chronic cardiomegaly without significant pulmonary vascular congestion. Increased prominence of the pulmonary interstitium may reflect mild interstitial edema or less likely pneumonia. Thoracic aortic atherosclerosis. A PA and lateral chest x-ray would be useful. Electronically Signed   By: David  Martinique M.D.   On: 05/06/2017 08:03     Assessment/Plan  #1-UTI-will treat with Cipro 250 mg twice a day for 7 days and probiotic twice a day for 10 days continue to [facility] is some suspicion for nausea maybe at least partly due to this.  #2 nausea ceases this is somewhat improved Prilosec as been guarded for concerns of GERD-also will be treated for UTI. She also continues on Zofran  prn  #3 diastolic CHF her weight appears to be trending down slightly this is complicated with her history of renal insufficiency creatinine has risen somewhat but not precipitously so since her admission-we will update this tomorrow  #4 history of renal insufficiency as noted above creatinine most recently was 2.1 BUN of 36 which is mildly increased from her admission creatinine-we will update this tomorrow and monitor clinically.  #5 diabetes type 2 with nephropathy-she continues on Lantus 12 units as well as Januvia 25 mg a day- (blood sugars appear to be stable largely in the lower mid 100s  [id number]

## 2017-05-25 ENCOUNTER — Encounter (HOSPITAL_COMMUNITY)
Admission: RE | Admit: 2017-05-25 | Discharge: 2017-05-25 | Disposition: A | Discharge status: Home / Self Care | Payer: Medicare Other | Source: Skilled Nursing Facility | Attending: *Deleted | Admitting: *Deleted

## 2017-05-25 LAB — BASIC METABOLIC PANEL
Anion gap: 8 (ref 5–15)
BUN: 37 mg/dL — AB (ref 6–20)
CALCIUM: 8.9 mg/dL (ref 8.9–10.3)
CO2: 34 mmol/L — AB (ref 22–32)
Chloride: 95 mmol/L — ABNORMAL LOW (ref 101–111)
Creatinine, Ser: 2.33 mg/dL — ABNORMAL HIGH (ref 0.44–1.00)
GFR calc Af Amer: 24 mL/min — ABNORMAL LOW (ref >60)
GFR, EST NON AFRICAN AMERICAN: 21 mL/min — AB (ref >60)
GLUCOSE: 130 mg/dL — AB (ref 65–99)
Potassium: 4.2 mmol/L (ref 3.5–5.1)
Sodium: 137 mmol/L (ref 135–145)

## 2017-05-26 LAB — URINE CULTURE: Culture: >=100000 — AB

## 2017-05-27 ENCOUNTER — Non-Acute Institutional Stay (SKILLED_NURSING_FACILITY): Payer: Medicare Other | Admitting: Internal Medicine

## 2017-05-27 ENCOUNTER — Encounter: Payer: Self-pay | Admitting: Internal Medicine

## 2017-05-27 DIAGNOSIS — N179 Acute kidney failure, unspecified: Secondary | ICD-10-CM | POA: Diagnosis not present

## 2017-05-27 DIAGNOSIS — R2689 Other abnormalities of gait and mobility: Secondary | ICD-10-CM

## 2017-05-27 DIAGNOSIS — I10 Essential (primary) hypertension: Secondary | ICD-10-CM | POA: Diagnosis not present

## 2017-05-27 DIAGNOSIS — E162 Hypoglycemia, unspecified: Secondary | ICD-10-CM | POA: Diagnosis not present

## 2017-05-27 DIAGNOSIS — I5031 Acute diastolic (congestive) heart failure: Secondary | ICD-10-CM

## 2017-05-27 DIAGNOSIS — I5033 Acute on chronic diastolic (congestive) heart failure: Secondary | ICD-10-CM | POA: Insufficient documentation

## 2017-05-27 DIAGNOSIS — E1121 Type 2 diabetes mellitus with diabetic nephropathy: Secondary | ICD-10-CM | POA: Diagnosis not present

## 2017-05-27 NOTE — Progress Notes (Signed)
Location:   [facility] Room Number: 126/P Place of Service:  SNF [phone]) Provider:  Lona Millard, Edwinna Areola, MD  Patient Care Team: Celene Squibb, MD as PCP - General (Internal Medicine)  Extended Emergency Contact Information Primary Emergency Contact: Tew,Kimberly Address: [address], [place] Phone: [phone] Relation: Daughter Secondary Emergency Contact: Tia Alert [place] Phone: [phone] Mobile Phone: [phone] Relation: Spouse  Code Status:  Full Code Goals of care: Advanced Directive information Advanced Directives 05/27/2017  Does Patient Have a Medical Advance Directive? Yes  Type of Advance Directive (No Data)  Does patient want to make changes to medical advance directive? No - Patient declined  Copy of [place] in Chart? -  Would patient like information on creating a medical advance directive? No - Patient declined  Pre-existing out of facility DNR order (yellow form or pink MOST form) -     Chief Complaint  Patient presents with  . Acute Visit    F/U    HPI:  Pt is a 66 y.o. female seen today for an acute visit for Follow up for UTI and Diabetes  Patient was admitted to SNF for therapy after hospitalization for Hypoglycemia and ARI  She does have h/o Diabetes type 2 , Hypertension, S/P CVA with mild right sided weakness, Diabetic Neuropathy  Patient has been doing well in the facility. She did get diagnosed with UTI and now is on Cipro. She still continues to be on Oxygen. Per hospital notes she was volume overload after she got fluids for ARI. Her Echo showed EF of 60-65% .   Her dose of Demadex was increased to 40 mg in the facility and she has lost almost 10 lbs since she has been here. She says she is feeling much better. Denies any chest pain, Cough or Fever. She is still  on 4 litres of oxygen.   Past Medical History:  Diagnosis  Date  . Abdominal aneurysm ([place])   . Bone spur   . Chronic back pain   . CVA (cerebral infarction)   . Diabetes mellitus without complication ([place])   . Hypertension   . Kidney stones   . Neuropathy   . Psoriasis   . Stroke ([place])   . Wound, open, foot    Past Surgical History:  Procedure Laterality Date  . ABDOMINAL HYSTERECTOMY    . BLADDER SUSPENSION    . VESICOVAGINAL FISTULA CLOSURE W/ TAH      Allergies  Allergen Reactions  . Glipizide     Severe hypoglycemia (22!)  . Penicillins Itching    Has patient had a PCN reaction causing immediate rash, facial/tongue/throat swelling, SOB or lightheadedness with hypotension: Yes Has patient had a PCN reaction causing severe rash involving mucus membranes or skin necrosis: No Has patient had a PCN reaction that required hospitalization: No Has patient had a PCN reaction occurring within the last 10 years: No If all of the above answers are "NO", then may proceed with Cephalosporin use.   . Clonidine Derivatives Other (See Comments)    Numbness  to tongue face, and arm  . Codeine Itching  . Hydrocodone-Acetaminophen Itching  . Shellfish Allergy Itching  . Sulfa Antibiotics Itching and Rash    Outpatient Encounter Prescriptions as of 05/27/2017  Medication Sig  . amLODipine (NORVASC) 10 MG tablet Take 10 mg by mouth daily.  Marland Kitchen  aspirin 325 MG tablet Take 1 tablet (325 mg total) by mouth daily.  . blood glucose meter kit and supplies KIT Dispense based on patient and insurance preference. Use up to four times daily as directed. (FOR ICD-9 250.00, 250.01).  . carvedilol (COREG) 6.25 MG tablet Take 1 tablet (6.25 mg total) by mouth 2 (two) times daily with a meal.  . ciprofloxacin (CIPRO) 250 MG tablet Take 250 mg by mouth 2 (two) times daily. Take for 7 days for UTI  . doxazosin (CARDURA) 4 MG tablet Take 1 tablet (4 mg total) by mouth daily.  . fenofibrate 160 MG tablet Take 160 mg by mouth daily.  . fexofenadine (ALLEGRA) 180 MG  tablet Take 180 mg by mouth daily.  Marland Kitchen gabapentin (NEURONTIN) 100 MG capsule Take 200 mg by mouth at bedtime.   . hydrALAZINE (APRESOLINE) 100 MG tablet Take 1 tablet (100 mg total) by mouth every 8 (eight) hours.  . insulin glargine (LANTUS) 100 UNIT/ML injection Inject 0.12 mLs (12 Units total) into the skin daily.  Marland Kitchen ketoconazole (NIZORAL) 2 % cream Apply to fungal rash under abdominal folds and bilateral breasts twice a day.  Marland Kitchen omeprazole (PRILOSEC) 20 MG capsule Take 20 mg by mouth daily.  . ondansetron (ZOFRAN) 4 MG tablet Take 4 mg by mouth every 8 (eight) hours as needed for nausea or vomiting.  . potassium chloride SA (K-DUR,KLOR-CON) 20 MEQ tablet Take 40 mEq by mouth daily.  . Probiotic Product (RISA-BID PROBIOTIC) TABS Take 1 tablet by mouth twice a day until 06/03/2017  . sitaGLIPtin (JANUVIA) 25 MG tablet Take 25 mg by mouth daily.  Marland Kitchen torsemide (DEMADEX) 20 MG tablet Take 40 mg by mouth daily.  . [DISCONTINUED] Lactobacillus Rhamnosus, GG, ([employer] PROBIOTIC, LACTOBACILLUS,) CAPS Give 1 capsule by mouth for 10 days twice a day stop date 05/24/2017   No facility-administered encounter medications on file as of 05/27/2017.      Review of Systems  Review of Systems  Constitutional: Negative for activity change, appetite change, chills, diaphoresis, fatigue and fever.  HENT: Negative for mouth sores, postnasal drip, rhinorrhea, sinus pain and sore throat.   Respiratory: Negative for apnea, cough, chest tightness, shortness of breath and wheezing.   Cardiovascular: Negative for chest pain, palpitations and Positive for  leg swelling.  Gastrointestinal: Negative for abdominal distention, abdominal pain, constipation, diarrhea, nausea and vomiting.  Genitourinary: Negative for dysuria and frequency.  Musculoskeletal: Negative for arthralgias, joint swelling and myalgias.  Skin: Negative for rash.  Neurological: Negative for dizziness, syncope, weakness, light-headedness and numbness.    Psychiatric/Behavioral: Negative for behavioral problems, confusion and sleep disturbance.     Immunization History  Administered Date(s) Administered  . Influenza,inj,Quad PF,36+ Mos 08/04/2013  . Pneumococcal Polysaccharide-23 08/04/2013   Pertinent  Health Maintenance Due  Topic Date Due  . OPHTHALMOLOGY EXAM  06/12/2017 (Originally 06/19/1961)  . URINE MICROALBUMIN  06/12/2017 (Originally 06/19/1961)  . FOOT EXAM  06/13/2017 (Originally 06/19/1961)  . MAMMOGRAM  06/13/2017 (Originally 06/19/2003)  . INFLUENZA VACCINE  09/11/2017 (Originally 05/12/2017)  . PAP SMEAR  09/11/2017 (Originally 06/18/1974)  . COLONOSCOPY  09/11/2017 (Originally 06/19/2003)  . HEMOGLOBIN A1C  11/01/2017   No flowsheet data found. Functional Status Survey:    Vitals:   05/27/17 1731  BP: (!) 153/59  Pulse: 72  Resp: 20  Temp: (!) 97.1 F (36.2 C)  SpO2: 90%   There is no height or weight on file to calculate BMI. Physical Exam  Constitutional: She is oriented to  person, place, and time. She appears well-developed and well-nourished.  HENT:  Head: Normocephalic.  Mouth/Throat: Oropharynx is clear and moist.  Eyes: Pupils are equal, round, and reactive to light.  Neck: Neck supple.  Cardiovascular: Normal rate and normal heart sounds.   Pulmonary/Chest: Effort normal. No respiratory distress. She has no wheezes. She has no rales.  Abdominal: Soft. Bowel sounds are normal. She exhibits no distension. There is no tenderness. There is no rebound and no guarding.  Musculoskeletal:  Moderate edema b/l  Neurological: She is alert and oriented to person, place, and time.  Has good strength in all extremities Doe shave glove and stocking sensory loss  Skin: Skin is warm and dry.  Psychiatric: She has a normal mood and affect. Her behavior is normal.    Labs reviewed:  Recent Labs  04/30/17 1740  05/05/17 0655  05/07/17 0415 05/08/17 0559  05/17/17 1030 05/20/17 0435 05/25/17 0730  NA 138  < >  136  < > 138 137  < > 139 138 137  K 2.8*  < > 4.2  < > 3.9 3.6  < > 3.6 4.0 4.2  CL 104  < > 106  < > 106 102  < > 99* 96* 95*  CO2 23  < > 23  < > 24 26  < > 32 33* 34*  GLUCOSE 41*  < > 160*  < > 149* 172*  < > 194* 114* 130*  BUN 30*  < > 39*  < > 40* 41*  < > 38* 36* 37*  CREATININE 1.57*  < > 1.57*  < > 1.69* 1.69*  < > 2.19* 2.21* 2.33*  CALCIUM 9.0  < > 8.8*  < > 9.0 8.9  < > 9.1 8.6* 8.9  MG 2.3  --   --   --   --   --   --   --   --   --   PHOS  --   --  4.2  --  4.7* 4.8*  --   --   --   --   < > = values in this interval not displayed.  Recent Labs  05/04/17 1419  05/08/17 0559 05/13/17 1520 05/13/17 2130  AST 23  --   --  16 17  ALT 16  --   --  11* 11*  ALKPHOS 39  --   --  44 43  BILITOT 1.0  --   --  1.0 0.8  PROT 6.7  --   --  6.5 6.6  ALBUMIN 3.1*  < > 3.0* 2.9* 3.1*  < > = values in this interval not displayed.  Recent Labs  04/30/17 1740  05/08/17 0559 05/13/17 1520 05/13/17 2130  WBC 6.7  < > 5.5 5.5 5.9  NEUTROABS 4.8  --   --  4.0 4.5  HGB 15.0  < > 11.9* 11.7* 11.6*  HCT 45.5  < > 36.9 36.1 36.2  MCV 87.8  < > 89.3 90.9 91.2  PLT 202  < > 174 166 169  < > = values in this interval not displayed. Lab Results  Component Value Date   TSH 3.612 05/01/2017   Lab Results  Component Value Date   HGBA1C 5.6 05/01/2017   Lab Results  Component Value Date   CHOL 179 08/03/2013   HDL 29 (L) 08/03/2013   LDLCALC 81 08/03/2013   TRIG 344 (H) 08/03/2013   CHOLHDL 6.2 08/03/2013    Significant Diagnostic  Results in last 30 days:  Dg Chest 2 View  Result Date: 05/13/2017 CLINICAL DATA:  Shortness of breath and edema. Discharged on 05/08/2017 with new acute renal failure and weight gain of 20 pounds steering hospitalization. Oxygen saturations are decreasing today. EXAM: CHEST  2 VIEW COMPARISON:  05/06/2017 FINDINGS: Cardiac enlargement with mild pulmonary vascular congestion. Perihilar infiltrates, greater on the right consistent with edema.  Increasing opacity in the right lung base may represent asymmetrical edema or superimposed pneumonia. Small bilateral pleural effusions. No pneumothorax. Calcified and tortuous aorta. Linear atelectasis or fibrosis in the left mid lung. Degenerative changes in the spine. IMPRESSION: Cardiac enlargement with pulmonary vascular congestion and perihilar edema. Bilateral pleural effusions. Asymmetrical edema versus pneumonia in the right lung base. Electronically Signed   By: Lucienne Capers M.D.   On: 05/13/2017 21:05   Dg Chest 2 View  Result Date: 04/30/2017 CLINICAL DATA:  Weakness with hypoglycemia EXAM: CHEST  2 VIEW COMPARISON:  03/24/2015 FINDINGS: Cardiomegaly with aortic atherosclerosis. No thoracic aortic aneurysm. No acute pulmonary consolidation, effusion or pneumothorax. No acute nor suspicious osseous lesions. IMPRESSION: Stable cardiomegaly without acute pulmonary disease. Aortic atherosclerosis without aneurysm. Electronically Signed   By: Ashley Royalty M.D.   On: 04/30/2017 19:42   US Renal  Result Date: 05/02/2017 CLINICAL DATA:  Acute kidney injury. EXAM: RENAL / URINARY TRACT ULTRASOUND COMPLETE COMPARISON:  CT 03/17/2012 FINDINGS: Right Kidney: Length: 11.9 cm. Echogenicity within normal limits. No mass or hydronephrosis visualized. Previous right renal stone is not seen, may have passed in the interim or not well visualized sonographically. Left Kidney: Length: 13.1 cm. Echogenicity within normal limits. No mass or hydronephrosis visualized. Previous left renal stones are not seen, may have passed in the interim or not visualize sonographically. Bladder: Appears normal for degree of bladder distention. Neither ureteral jet was visualized. IMPRESSION: No hydronephrosis or obstructive uropathy. Unremarkable renal ultrasound. Electronically Signed   By: Jeb Levering M.D.   On: 05/02/2017 22:35   Dg Chest Port 1 View  Result Date: 05/06/2017 CLINICAL DATA:  Shortness of breath, history  of diabetes, hypertension, acute renal injury, former smoker. EXAM: PORTABLE CHEST 1 VIEW COMPARISON:  PA and lateral chest x-ray of April 30, 2017 FINDINGS: The lungs are well-expanded. The interstitial markings are coarse and more conspicuous today. There is no pleural effusion or pneumothorax. The cardiac silhouette is enlarged. The central pulmonary vascularity is prominent but stable. There is no definite cephalization. The bony thorax exhibits no acute abnormality. IMPRESSION: Chronic cardiomegaly without significant pulmonary vascular congestion. Increased prominence of the pulmonary interstitium may reflect mild interstitial edema or less likely pneumonia. Thoracic aortic atherosclerosis. A PA and lateral chest x-ray would be useful. Electronically Signed   By: David  Martinique M.D.   On: 05/06/2017 08:03    Assessment/Plan  Acute diastolic heart failure  Patient continues to need oxygen supplement. She is 90 %on 4Lit of O2. She was not on any Oxygen at home Will repeat Chest xray to follow her pulmonary congestion. Her weight is still more then what it was at home. continue Demadex Repeat BNP  UTI Doing well on Cipro.  Hypertension She was taken off Hyzaar in the hospital due to ARI But she was also started on Hydralazine,Coreg and Cardura Her BP is still Slightly elevated.  Type 2 diabetes with nephropathy  She is off glipizide and Januvia dose was reduced. Her BS are controlled on Lantus. Mostly staying around less then 200  Hypoglycemia Glipizide was discontiued  AKI (acute kidney injury)  Creat still elevated then her baseline She will need follow up with Nephrology. Appointment schedeule  H/O Ischemic stroke On aspirin and fenofibrate. Not sure why she is not on statin  Diabetic Neuropathy Continiue on Neurontin.  Unstable balance Patient lives with her husband and was walking with walker at home. She is working with therapy.    Family/ staff Communication:    Labs/tests ordered:  BMP, BNP, CBC Chest Xray Total time spent in this patient care encounter was 45_ minutes; greater than 50% of the visit spent counseling patient, reviewing records , Labs and coordinating care for problems addressed at this encounter.

## 2017-05-28 ENCOUNTER — Non-Acute Institutional Stay (SKILLED_NURSING_FACILITY): Payer: Medicare Other | Admitting: Internal Medicine

## 2017-05-28 DIAGNOSIS — E1121 Type 2 diabetes mellitus with diabetic nephropathy: Secondary | ICD-10-CM | POA: Diagnosis not present

## 2017-05-28 DIAGNOSIS — I5031 Acute diastolic (congestive) heart failure: Secondary | ICD-10-CM

## 2017-05-28 DIAGNOSIS — N179 Acute kidney failure, unspecified: Secondary | ICD-10-CM | POA: Diagnosis not present

## 2017-05-28 NOTE — Progress Notes (Signed)
This is an acute visit.  Level care skilled.  Facility is [facility].  Chief complaint-acute visit follow-up chest x-ray with history of edema and volume overload.  She was admitted here for therapy after hospitalization for hypoglycemia and acute renal insufficiency-  She has a history of diabetes type 2 as well as hypertension CVA with mild right-sided weakness and diabetic neuropathy.  She continues to do fairly well in the facility she did have a UTI and was treated with Cipro-she continues to require oxygen.  She was volume overloaded in the hospital after she got fluids for. Renal insufficiency echo showed ejection fraction of 60-65 percent.  We've increased her dose of Demadex shortly after admission secondary to weight gain edema and concerning chest x-ray also she had a short course of antibiotic for possible pneumonia.  She continues to do better she is not really complaining of cough or shortness of breath.  Dr. Lyndel Safe did see her yesterday and ordered a follow-up chest x-ray which shows a small right base infiltrate and right pleural effusion and interstitial edema-this actually appears to be somewhat improved from previous chest x-ray-which showed effusions were bilateral-  Regards to diabetes this appears to be stable on current dose of Januvia 25 mg a day as well as Lantus 12 units blood sugars are largely in the 100s.  Currently she has no complaints she had complain of nausea but apparently this is improving she was started on Prilosec and again was treated for UTI  Past Medical History:  Diagnosis Date  . Abdominal aneurysm ([place])   . Bone spur   . Chronic back pain   . CVA (cerebral infarction)   . Diabetes mellitus without complication ([place])   . Hypertension   . Kidney stones   . Neuropathy   . Psoriasis   . Stroke ([place])   . Wound, open, foot         Past Surgical History:  Procedure Laterality Date  . ABDOMINAL HYSTERECTOMY    .  BLADDER SUSPENSION    . VESICOVAGINAL FISTULA CLOSURE W/ TAH           Allergies  Allergen Reactions  . Glipizide     Severe hypoglycemia (22!)  . Penicillins Itching    Has patient had a PCN reaction causing immediate rash, facial/tongue/throat swelling, SOB or lightheadedness with hypotension: Yes Has patient had a PCN reaction causing severe rash involving mucus membranes or skin necrosis: No Has patient had a PCN reaction that required hospitalization: No Has patient had a PCN reaction occurring within the last 10 years: No If all of the above answers are "NO", then may proceed with Cephalosporin use.   . Clonidine Derivatives Other (See Comments)    Numbness  to tongue face, and arm  . Codeine Itching  . Hydrocodone-Acetaminophen Itching  . Shellfish Allergy Itching  . Sulfa Antibiotics Itching and Rash        Outpatient Encounter Prescriptions as of 05/27/2017  Medication Sig  . amLODipine (NORVASC) 10 MG tablet Take 10 mg by mouth daily.  Marland Kitchen aspirin 325 MG tablet Take 1 tablet (325 mg total) by mouth daily.  . blood glucose meter kit and supplies KIT Dispense based on patient and insurance preference. Use up to four times daily as directed. (FOR ICD-9 250.00, 250.01).  . carvedilol (COREG) 6.25 MG tablet Take 1 tablet (6.25 mg total) by mouth 2 (two) times daily with a meal.  . ciprofloxacin (CIPRO) 250 MG tablet Take  250 mg by mouth 2 (two) times daily. Take for 7 days for UTI  . doxazosin (CARDURA) 4 MG tablet Take 1 tablet (4 mg total) by mouth daily.  . fenofibrate 160 MG tablet Take 160 mg by mouth daily.  . fexofenadine (ALLEGRA) 180 MG tablet Take 180 mg by mouth daily.  Marland Kitchen gabapentin (NEURONTIN) 100 MG capsule Take 200 mg by mouth at bedtime.   . hydrALAZINE (APRESOLINE) 100 MG tablet Take 1 tablet (100 mg total) by mouth every 8 (eight) hours.  . insulin glargine (LANTUS) 100 UNIT/ML injection Inject 0.12 mLs (12 Units total) into the skin daily.  Marland Kitchen  ketoconazole (NIZORAL) 2 % cream Apply to fungal rash under abdominal folds and bilateral breasts twice a day.  Marland Kitchen omeprazole (PRILOSEC) 20 MG capsule Take 20 mg by mouth daily.  . ondansetron (ZOFRAN) 4 MG tablet Take 4 mg by mouth every 8 (eight) hours as needed for nausea or vomiting.  . potassium chloride SA (K-DUR,KLOR-CON) 20 MEQ tablet Take 40 mEq by mouth daily.  . Probiotic Product (RISA-BID PROBIOTIC) TABS Take 1 tablet by mouth twice a day until 06/03/2017  . sitaGLIPtin (JANUVIA) 25 MG tablet Take 25 mg by mouth daily.  Marland Kitchen torsemide (DEMADEX) 20 MG tablet Take 40 mg by mouth daily.  . [DISCONTINUED] Lactobacillus Rhamnosus, GG, ([employer] PROBIOTIC, LACTOBACILLUS,) CAPS Give 1 capsule by mouth for 10 days twice a day stop date 05/24/2017   No facility-administered encounter medications on file as of 05/27/2017.     Review of systems.  General does not complaining of any fever or chills says she feels relatively well would like to go home at some point.  Skin does not complain around 13.  Head ears eyes nose mouth and throat does not complaining of any sore throat or visual changes.  Respiratory denies shortness of breath still requires oxygen does not complain of cough.  Cardiac denies chest pain does have leg edema.  GI does not complaining of any nausea vomiting diarrhea constipation.  GU has been treated for UTI does not complaini of dysuria.  Musculoskeletal has weakness especially lower extremity but appears to be gaining a bit of strength.  Neurologic does not complain of dizziness headache or syncope.  Psych does not complain of overt depression or anxiety    Physical exam.  Temperature is 98.4 pulse 73 respirations 20 blood pressure 138/71.  Weight is 213.6.  This appears to be slowly trending down about 9 pounds it appears since the beginning of the month.  In general this is a pleasant obese female lying in bed she is comfortable.  Her skin is warm and  dry.  Oropharynx is clear mucous membranes moist.  Eyes visual acuity appears grossly intact sclera and conjunctiva are clear.  Chest is clear to auscultation with shallow air entry there is no labored breathing.  Heart is regular rate and rhythm without murmur gallop or rub she has I would say moderate lower extremity edema this appears somewhat improved compared to her admission edema.  Abdomen is obese soft nontender with positive bowel sounds there is some old bruising.  Muscle skeletal does move all extremities 4 with some lower extremity weakness which I suspect is due to deconditioning.  Neurologic is grossly intact no lateralizing findings her speech is clear. She is wearing gloves bilaterally with a history of sensory loss  Psych she is alert and oriented pleasant and appropriate  Labs.  All this 14th 2018.  Sodium 137 potassium 4.2 BUN  37 creatinine 2.33.  05/13/2017.  WBC 5.9 hemoglobin 11.6 platelets 169.  BNP was 347.  Assessment plan.  #1 history of volume overload diastolic CHF with persistent edema-the edema and weight appears to be slowly trending down-I did discuss x-ray results with Dr. Lyndel Safe via phone will increase her Demadex temporarily up to 60 mg a day for 3 days and then back to 40 mg a day-clinically she appears to be stable updated labs including a metabolic panel are pending for Monday, August 20.  She continues on Coreg and hydralazine as well as she will get a BNP on Monday well.  #2 diabetes type 2 she is on Januvia and Lantus this appears stabilized I do not see evidence of hypoglycemia or significantly consistent elevated readings blood sugars largely in the 100s.  #3 renal insufficiency creatinine is slowly trending up but not precipitously so-again update metabolic panel is pending for Monday- #4  History of UTIs she is completing a course of Cipro this appears to be stable certainly does not give a septic presentation is not really  complaining of dysuria apparently nausea has improved some Organism is Escherichia coli   [phone]    [id number]

## 2017-05-31 ENCOUNTER — Encounter (HOSPITAL_COMMUNITY)
Admission: RE | Admit: 2017-05-31 | Discharge: 2017-05-31 | Disposition: A | Discharge status: Home / Self Care | Payer: Medicare Other | Source: Skilled Nursing Facility | Attending: *Deleted | Admitting: *Deleted

## 2017-05-31 LAB — CBC
HCT: 34.1 % — ABNORMAL LOW (ref 36.0–46.0)
Hemoglobin: 10.8 g/dL — ABNORMAL LOW (ref 12.0–15.0)
MCH: 29.5 pg (ref 26.0–34.0)
MCHC: 31.7 g/dL (ref 30.0–36.0)
MCV: 93.2 fL (ref 78.0–100.0)
PLATELETS: 137 10*3/uL — AB (ref 150–400)
RBC: 3.66 MIL/uL — AB (ref 3.87–5.11)
RDW: 15.7 % — AB (ref 11.5–15.5)
WBC: 3.3 10*3/uL — AB (ref 4.0–10.5)

## 2017-05-31 LAB — COMPREHENSIVE METABOLIC PANEL
ALT: 12 U/L — AB (ref 14–54)
AST: 21 U/L (ref 15–41)
Albumin: 2.9 g/dL — ABNORMAL LOW (ref 3.5–5.0)
Alkaline Phosphatase: 30 U/L — ABNORMAL LOW (ref 38–126)
Anion gap: 8 (ref 5–15)
BUN: 32 mg/dL — ABNORMAL HIGH (ref 6–20)
CHLORIDE: 98 mmol/L — AB (ref 101–111)
CO2: 32 mmol/L (ref 22–32)
CREATININE: 2.02 mg/dL — AB (ref 0.44–1.00)
Calcium: 8.5 mg/dL — ABNORMAL LOW (ref 8.9–10.3)
GFR, EST AFRICAN AMERICAN: 29 mL/min — AB (ref >60)
GFR, EST NON AFRICAN AMERICAN: 25 mL/min — AB (ref >60)
Glucose, Bld: 126 mg/dL — ABNORMAL HIGH (ref 65–99)
POTASSIUM: 3.5 mmol/L (ref 3.5–5.1)
SODIUM: 138 mmol/L (ref 135–145)
Total Bilirubin: 0.8 mg/dL (ref 0.3–1.2)
Total Protein: 6.1 g/dL — ABNORMAL LOW (ref 6.5–8.1)

## 2017-05-31 LAB — BRAIN NATRIURETIC PEPTIDE: B NATRIURETIC PEPTIDE 5: 287 pg/mL — AB (ref 0.0–100.0)

## 2017-06-01 ENCOUNTER — Non-Acute Institutional Stay (SKILLED_NURSING_FACILITY): Payer: Medicare Other | Admitting: Internal Medicine

## 2017-06-01 ENCOUNTER — Encounter: Payer: Self-pay | Admitting: Internal Medicine

## 2017-06-01 DIAGNOSIS — E1121 Type 2 diabetes mellitus with diabetic nephropathy: Secondary | ICD-10-CM

## 2017-06-01 DIAGNOSIS — N179 Acute kidney failure, unspecified: Secondary | ICD-10-CM | POA: Diagnosis not present

## 2017-06-01 DIAGNOSIS — I5031 Acute diastolic (congestive) heart failure: Secondary | ICD-10-CM

## 2017-06-01 DIAGNOSIS — I10 Essential (primary) hypertension: Secondary | ICD-10-CM

## 2017-06-01 NOTE — Progress Notes (Signed)
Location:   [facility] Room Number: 126/P Place of Service:  SNF [phone]) Provider:  Lona Millard, Edwinna Areola, MD  Patient Care Team: Celene Squibb, MD as PCP - General (Internal Medicine)  Extended Emergency Contact Information Primary Emergency Contact: Tew,Kimberly Address: [address], [place] Phone: [phone] Relation: Daughter Secondary Emergency Contact: Tia Alert [place] Phone: [phone] Mobile Phone: [phone] Relation: Spouse  Code Status:  Full Code Goals of care: Advanced Directive information Advanced Directives 06/01/2017  Does Patient Have a Medical Advance Directive? Yes  Type of Advance Directive (No Data)  Does patient want to make changes to medical advance directive? No - Patient declined  Copy of [place] in Chart? -  Would patient like information on creating a medical advance directive? No - Patient declined  Pre-existing out of facility DNR order (yellow form or pink MOST form) -     Chief Complaint  Patient presents with  . Acute Visit    For discharge planning    HPI:  Pt is a 66 y.o. female seen today for an acute visit to see patient and talk to the Daughter to discuss Discharge  She does have h/o Diabetes type 2 , Hypertension, S/P CVA with mild right sided weakness, Diabetic Neuropathy Patient was admitted to SNF for therapy after being in the hospital for Severe Hypoglycemia and renal injury. She was Hydrated but then had CHF. Since she has been to facility she has Diuresed and has lost 10 lbs. From 222 lbs to 212 lbs. Patient says her Dry weight is around 119 lbs. She is doing well with therapy and is able to walk 70 feet with the walker. She is also able to transfer to Wheelchair and stand by herself. This was her Baseline at home. She conitinues to have SOB but better then before. But continues to be on 4l of Oxygen per  Nurses. No Cough or Chest pain. No Nausea or Vomiting. Or abdominal pain which has resolved.   Past Medical History:  Diagnosis Date  . Abdominal aneurysm ([place])   . Bone spur   . Chronic back pain   . CVA (cerebral infarction)   . Diabetes mellitus    . Hypertension   . Kidney stones   . Neuropathy   . Psoriasis   . Stroke ([place])   . Wound, open, foot    Past Surgical History:  Procedure Laterality Date  . ABDOMINAL HYSTERECTOMY    . BLADDER SUSPENSION    . VESICOVAGINAL FISTULA CLOSURE W/ TAH      Allergies  Allergen Reactions  . Glipizide     Severe hypoglycemia (22!)  . Penicillins Itching    Has patient had a PCN reaction causing immediate rash, facial/tongue/throat swelling, SOB or lightheadedness with hypotension: Yes Has patient had a PCN reaction causing severe rash involving mucus membranes or skin necrosis: No Has patient had a PCN reaction that required hospitalization: No Has patient had a PCN reaction occurring within the last 10 years: No If all of the above answers are "NO", then may proceed with Cephalosporin use.   . Clonidine Derivatives Other (See Comments)    Numbness  to tongue face, and arm  . Codeine Itching  . Hydrocodone-Acetaminophen Itching  . Shellfish Allergy Itching  . Sulfa Antibiotics Itching and Rash    Outpatient Encounter Prescriptions as of 06/01/2017  Medication Sig  . amLODipine (NORVASC) 10 MG tablet Take 10 mg by mouth daily.  Marland Kitchen aspirin 325 MG tablet Take 1 tablet (325 mg total) by mouth daily.  . blood glucose meter kit and supplies KIT Dispense based on patient and insurance preference. Use up to four times daily as directed. (FOR ICD-9 250.00, 250.01).  . carvedilol (COREG) 6.25 MG tablet Take 1 tablet (6.25 mg total) by mouth 2 (two) times daily with a meal.  . doxazosin (CARDURA) 4 MG tablet Take 1 tablet (4 mg total) by mouth daily.  . fenofibrate 160 MG tablet Take 160 mg by mouth daily.  . fexofenadine (ALLEGRA) 180 MG  tablet Take 180 mg by mouth daily.  Marland Kitchen gabapentin (NEURONTIN) 100 MG capsule Take 200 mg by mouth at bedtime.   . hydrALAZINE (APRESOLINE) 100 MG tablet Take 1 tablet (100 mg total) by mouth every 8 (eight) hours.  . insulin glargine (LANTUS) 100 UNIT/ML injection Inject 0.12 mLs (12 Units total) into the skin daily.  Marland Kitchen omeprazole (PRILOSEC) 20 MG capsule Take 20 mg by mouth daily.  . ondansetron (ZOFRAN) 4 MG tablet Take 4 mg by mouth every 8 (eight) hours as needed for nausea or vomiting.  . potassium chloride SA (K-DUR,KLOR-CON) 20 MEQ tablet Take 40 mEq by mouth daily.  . Probiotic Product (RISA-BID PROBIOTIC) TABS Take 1 tablet by mouth twice a day until 06/03/2017  . sitaGLIPtin (JANUVIA) 25 MG tablet Take 25 mg by mouth daily.  Marland Kitchen torsemide (DEMADEX) 20 MG tablet Take 40 mg by mouth daily.  . [DISCONTINUED] ciprofloxacin (CIPRO) 250 MG tablet Take 250 mg by mouth 2 (two) times daily. Take for 7 days for UTI  . [DISCONTINUED] ketoconazole (NIZORAL) 2 % cream Apply to fungal rash under abdominal folds and bilateral breasts twice a day.   No facility-administered encounter medications on file as of 06/01/2017.      Review of Systems  Constitutional: Negative for activity change, appetite change, chills, diaphoresis, fatigue and fever.  HENT: Negative.   Respiratory: Positive for shortness of breath. Negative for cough, chest tightness and wheezing.   Cardiovascular: Positive for leg swelling. Negative for chest pain and palpitations.  Gastrointestinal: Negative.   Genitourinary: Positive for frequency. Negative for dysuria and urgency.  Musculoskeletal: Negative.   Skin: Negative.   Neurological: Negative.   Psychiatric/Behavioral: Negative.     Immunization History  Administered Date(s) Administered  . Influenza,inj,Quad PF,6+ Mos 08/04/2013  . Pneumococcal Polysaccharide-23 08/04/2013   Pertinent  Health Maintenance Due  Topic Date Due  . OPHTHALMOLOGY EXAM  06/12/2017  (Originally 06/19/1961)  . URINE MICROALBUMIN  06/12/2017 (Originally 06/19/1961)  . FOOT EXAM  06/13/2017 (Originally 06/19/1961)  . MAMMOGRAM  06/13/2017 (Originally 06/19/2003)  . INFLUENZA VACCINE  09/11/2017 (Originally 05/12/2017)  . PAP SMEAR  09/11/2017 (Originally 06/18/1974)  . COLONOSCOPY  09/11/2017 (Originally 06/19/2003)  . HEMOGLOBIN A1C  11/01/2017   No flowsheet data found. Functional Status Survey:    Vitals:   06/01/17 1430  BP: (!) 157/69  Pulse: 76  Resp: 20  Temp: 97.9 F (36.6 C)  TempSrc: Oral   There is no height or weight on file to calculate BMI. Physical Exam  Constitutional: She is oriented to person, place, and time. She appears well-developed and well-nourished.  HENT:  Head: Normocephalic.  Mouth/Throat: Oropharynx is clear and moist.  Eyes: Pupils are equal, round, and reactive to light.  Neck: Neck supple.  Cardiovascular: Normal rate and normal heart sounds.   No murmur  heard. Pulmonary/Chest: Effort normal and breath sounds normal. No respiratory distress. She has no rales.  Abdominal: Soft. Bowel sounds are normal. She exhibits no distension. There is no tenderness. There is no rebound.  Musculoskeletal:  Moderate Edema b/l  Neurological: She is alert and oriented to person, place, and time.  No focal deficits.  Skin: Skin is warm and dry.  Psychiatric: She has a normal mood and affect. Her behavior is normal. Thought content normal.    Labs reviewed:  Recent Labs  04/30/17 1740  05/05/17 0655  05/07/17 0415 05/08/17 0559  05/20/17 0435 05/25/17 0730 05/31/17 0630  NA 138  < > 136  < > 138 137  < > 138 137 138  K 2.8*  < > 4.2  < > 3.9 3.6  < > 4.0 4.2 3.5  CL 104  < > 106  < > 106 102  < > 96* 95* 98*  CO2 23  < > 23  < > 24 26  < > 33* 34* 32  GLUCOSE 41*  < > 160*  < > 149* 172*  < > 114* 130* 126*  BUN 30*  < > 39*  < > 40* 41*  < > 36* 37* 32*  CREATININE 1.57*  < > 1.57*  < > 1.69* 1.69*  < > 2.21* 2.33* 2.02*  CALCIUM 9.0  < >  8.8*  < > 9.0 8.9  < > 8.6* 8.9 8.5*  MG 2.3  --   --   --   --   --   --   --   --   --   PHOS  --   --  4.2  --  4.7* 4.8*  --   --   --   --   < > = values in this interval not displayed.  Recent Labs  05/13/17 1520 05/13/17 2130 05/31/17 0630  AST 16 17 21   ALT 11* 11* 12*  ALKPHOS 44 43 30*  BILITOT 1.0 0.8 0.8  PROT 6.5 6.6 6.1*  ALBUMIN 2.9* 3.1* 2.9*    Recent Labs  04/30/17 1740  05/13/17 1520 05/13/17 2130 05/31/17 0630  WBC 6.7  < > 5.5 5.9 3.3*  NEUTROABS 4.8  --  4.0 4.5  --   HGB 15.0  < > 11.7* 11.6* 10.8*  HCT 45.5  < > 36.1 36.2 34.1*  MCV 87.8  < > 90.9 91.2 93.2  PLT 202  < > 166 169 137*  < > = values in this interval not displayed. Lab Results  Component Value Date   TSH 3.612 05/01/2017   Lab Results  Component Value Date   HGBA1C 5.6 05/01/2017   Lab Results  Component Value Date   CHOL 179 08/03/2013   HDL 29 (L) 08/03/2013   LDLCALC 81 08/03/2013   TRIG 344 (H) 08/03/2013   CHOLHDL 6.2 08/03/2013    Significant Diagnostic Results in last 30 days:  Dg Chest 2 View  Result Date: 05/13/2017 CLINICAL DATA:  Shortness of breath and edema. Discharged on 05/08/2017 with new acute renal failure and weight gain of 20 pounds steering hospitalization. Oxygen saturations are decreasing today. EXAM: CHEST  2 VIEW COMPARISON:  05/06/2017 FINDINGS: Cardiac enlargement with mild pulmonary vascular congestion. Perihilar infiltrates, greater on the right consistent with edema. Increasing opacity in the right lung base may represent asymmetrical edema or superimposed pneumonia. Small bilateral pleural effusions. No pneumothorax. Calcified and tortuous aorta. Linear atelectasis or fibrosis in the left  mid lung. Degenerative changes in the spine. IMPRESSION: Cardiac enlargement with pulmonary vascular congestion and perihilar edema. Bilateral pleural effusions. Asymmetrical edema versus pneumonia in the right lung base. Electronically Signed   By: Lucienne Capers M.D.   On: 05/13/2017 21:05   Dg Chest Port 1 View  Result Date: 05/06/2017 CLINICAL DATA:  Shortness of breath, history of diabetes, hypertension, acute renal injury, former smoker. EXAM: PORTABLE CHEST 1 VIEW COMPARISON:  PA and lateral chest x-ray of April 30, 2017 FINDINGS: The lungs are well-expanded. The interstitial markings are coarse and more conspicuous today. There is no pleural effusion or pneumothorax. The cardiac silhouette is enlarged. The central pulmonary vascularity is prominent but stable. There is no definite cephalization. The bony thorax exhibits no acute abnormality. IMPRESSION: Chronic cardiomegaly without significant pulmonary vascular congestion. Increased prominence of the pulmonary interstitium may reflect mild interstitial edema or less likely pneumonia. Thoracic aortic atherosclerosis. A PA and lateral chest x-ray would be useful. Electronically Signed   By: David  Martinique M.D.   On: 05/06/2017 08:03    Assessment/Plan  Acute diastolic heart failure  Patient continues to need oxygen supply. She was not on any Oxygen at home Repeat Chest Xray showed Continous Pulmonary edema. Her weight is still more then what it was at home. continue Demadex Repeat BMP  UTI Doing well on Cipro.  Hypertension She was taken off Hyzaar in the hospital due to ARI But she was also started on Hydralazine,Coreg and Cardura Her BP is still Slightly elevated.  Type 2 diabetes with nephropathy  She is off glipizide and Januvia dose was reduced. Her BS are controlled on Lantus. Mostly staying around less then 200  Hypoglycemia Glipizide was discontiued  AKI (acute kidney injury)  Creat still elevated then her baseline. Repeat BMP in few days She will need follow up with Nephrology. Appointment schedeule  H/O Ischemic stroke On aspirin and fenofibrate. Not sure why she is not on statin  Diabetic Neuropathy Continiue on Neurontin.  Unstable balance/ Weakness  Discharge planning. Patient lives with her husband and was walking with walker at home. She is working with therapy. But d/w the daughter who is concerned about her mother being not able to walk without walker. I reviewed her records and it looks like she had stroke in 2014 and for past few months she has been progressively getting more difficult to walk. Per therapy she has peaked Her Mri in Past has showed Small vessel disease. She needs to folow with Neurology and repeat MRI. Patient is does not seem interested. Will Make appointment with Neurology to follow as out patient Also she will need home health at home for some help with ADLs.as her husband and daughter work.   Family/ staff Communication: Patient will need Neurology, Nephrology and Pulmonary consult as out patient.  Labs/tests ordered:  BMP Total time spent in this patient care encounter was _ minutes; greater than 50% of the visit spent counseling patient, reviewing records , Labs and coordinating care for problems addressed at this encounter.

## 2017-06-03 ENCOUNTER — Encounter (HOSPITAL_COMMUNITY)
Admission: RE | Admit: 2017-06-03 | Discharge: 2017-06-03 | Disposition: A | Discharge status: Home / Self Care | Payer: 59 | Source: Skilled Nursing Facility | Attending: Internal Medicine | Admitting: Internal Medicine

## 2017-06-03 DIAGNOSIS — E1140 Type 2 diabetes mellitus with diabetic neuropathy, unspecified: Secondary | ICD-10-CM | POA: Insufficient documentation

## 2017-06-03 DIAGNOSIS — B9620 Unspecified Escherichia coli [E. coli] as the cause of diseases classified elsewhere: Secondary | ICD-10-CM | POA: Insufficient documentation

## 2017-06-03 DIAGNOSIS — Z5189 Encounter for other specified aftercare: Secondary | ICD-10-CM | POA: Insufficient documentation

## 2017-06-03 LAB — BASIC METABOLIC PANEL
ANION GAP: 7 (ref 5–15)
BUN: 33 mg/dL — ABNORMAL HIGH (ref 6–20)
CALCIUM: 8.6 mg/dL — AB (ref 8.9–10.3)
CO2: 32 mmol/L (ref 22–32)
Chloride: 98 mmol/L — ABNORMAL LOW (ref 101–111)
Creatinine, Ser: 1.95 mg/dL — ABNORMAL HIGH (ref 0.44–1.00)
GFR, EST AFRICAN AMERICAN: 30 mL/min — AB (ref >60)
GFR, EST NON AFRICAN AMERICAN: 26 mL/min — AB (ref >60)
GLUCOSE: 110 mg/dL — AB (ref 65–99)
POTASSIUM: 3.6 mmol/L (ref 3.5–5.1)
Sodium: 137 mmol/L (ref 135–145)

## 2017-06-03 LAB — CBC WITH DIFFERENTIAL/PLATELET
BASOS ABS: 0 10*3/uL (ref 0.0–0.1)
Basophils Relative: 1 %
EOS ABS: 0.1 10*3/uL (ref 0.0–0.7)
Eosinophils Relative: 2 %
HCT: 33.8 % — ABNORMAL LOW (ref 36.0–46.0)
HEMOGLOBIN: 10.6 g/dL — AB (ref 12.0–15.0)
Lymphocytes Relative: 26 %
Lymphs Abs: 0.8 10*3/uL (ref 0.7–4.0)
MCH: 29 pg (ref 26.0–34.0)
MCHC: 31.4 g/dL (ref 30.0–36.0)
MCV: 92.3 fL (ref 78.0–100.0)
Monocytes Absolute: 0.3 10*3/uL (ref 0.1–1.0)
Monocytes Relative: 9 %
NEUTROS ABS: 1.8 10*3/uL (ref 1.7–7.7)
Neutrophils Relative %: 62 %
Platelets: 146 10*3/uL — ABNORMAL LOW (ref 150–400)
RBC: 3.66 MIL/uL — AB (ref 3.87–5.11)
RDW: 15.7 % — ABNORMAL HIGH (ref 11.5–15.5)
WBC: 2.9 10*3/uL — ABNORMAL LOW (ref 4.0–10.5)

## 2017-06-03 LAB — BRAIN NATRIURETIC PEPTIDE: B NATRIURETIC PEPTIDE 5: 327 pg/mL — AB (ref 0.0–100.0)

## 2017-06-04 ENCOUNTER — Non-Acute Institutional Stay (SKILLED_NURSING_FACILITY): Payer: Medicare Other | Admitting: Internal Medicine

## 2017-06-04 ENCOUNTER — Encounter: Payer: Self-pay | Admitting: Internal Medicine

## 2017-06-04 DIAGNOSIS — R29898 Other symptoms and signs involving the musculoskeletal system: Secondary | ICD-10-CM

## 2017-06-04 DIAGNOSIS — E1121 Type 2 diabetes mellitus with diabetic nephropathy: Secondary | ICD-10-CM | POA: Diagnosis not present

## 2017-06-04 DIAGNOSIS — I5031 Acute diastolic (congestive) heart failure: Secondary | ICD-10-CM | POA: Diagnosis not present

## 2017-06-04 DIAGNOSIS — I10 Essential (primary) hypertension: Secondary | ICD-10-CM

## 2017-06-04 DIAGNOSIS — N179 Acute kidney failure, unspecified: Secondary | ICD-10-CM

## 2017-06-04 NOTE — Progress Notes (Signed)
Location:    [facility] Room Number: 126/P Place of Service:  SNF [phone]) Provider:  Cory Roughen, MD  Patient Care Team: Celene Squibb, MD as PCP - General (Internal Medicine)  Extended Emergency Contact Information Primary Emergency Contact: Tew,Kimberly Address: [address], [place] Phone: [phone] Relation: Daughter Secondary Emergency Contact: Tia Alert [place] Phone: [phone] Mobile Phone: [phone] Relation: Spouse  Code Status:  Full Code Goals of care: Advanced Directive information Advanced Directives 06/04/2017  Does Patient Have a Medical Advance Directive? Yes  Type of Advance Directive (No Data)  Does patient want to make changes to medical advance directive? No - Patient declined  Copy of [place] in Chart? -  Would patient like information on creating a medical advance directive? No - Patient declined  Pre-existing out of facility DNR order (yellow form or pink MOST form) -     Chief Complaint  Patient presents with  . Discharge Note    Discharge Visit    HPI:  Pt is a 66 y.o. female seen today for an acute visit for Discharge from facility this weekend.  Patient has a history of diabetes type 2 hypertension history CVA with mild right-sided weakness and diabetic neuropathy-she was admitted here for therapy after a hospitalization for hypoglycemia acute kidney injury.  She did receive IV fluids with then had CHF.  She has been diuresed and continues with diuresis with actually increased her Lasix upon admission up to 40 mg a day. This has been increased up to 60 mg a day  And she has lost some weight and edema.  She is doing well with therapy and now is able to walk with a walker.  Can also transfer and stand by herself which is a significant improvement.  Apparently before her admission here she was home for short period  but her weakness was a considerable issue requiring placement in skilled nursing.  In regards to diabetes she is on Januvia 25 mg a day as well as Lantus 12 units every morning-sugars appear to be quite well controlled mainly in the lower mid 100s in the morning and is actually continues to be the case throughout all periods throughout the day and at at bedtime she does have a history of chronic kidney disease however this actually appears improved with the creatinine 1.95 on lab done August 23.  I also note she has some mild leukopenia with white count of 2900 on lab done on the 23rd this will warm follow-up by home healt next week    She has a very supportive husband and daughter she will be with her husband at home and her daughter will also be very involved.  She will need PT and OT for further strengthening as well as nursing support to help with her multiple medical issues.  She does continue on oxygen and she has failed tapering this will need to be continued at home.          Past Medical History:  Diagnosis Date  . Abdominal aneurysm ([place])   . Bone spur   . Chronic back pain   . CVA (cerebral infarction)   . Diabetes mellitus without complication ([place])   . Hypertension   . Kidney stones   . Neuropathy   . Psoriasis   . Stroke ([place])   . Wound, open,  foot    Past Surgical History:  Procedure Laterality Date  . ABDOMINAL HYSTERECTOMY    . BLADDER SUSPENSION    . VESICOVAGINAL FISTULA CLOSURE W/ TAH      Allergies  Allergen Reactions  . Glipizide     Severe hypoglycemia (22!)  . Penicillins Itching    Has patient had a PCN reaction causing immediate rash, facial/tongue/throat swelling, SOB or lightheadedness with hypotension: Yes Has patient had a PCN reaction causing severe rash involving mucus membranes or skin necrosis: No Has patient had a PCN reaction that required hospitalization: No Has patient had a PCN reaction occurring within the last 10 years:  No If all of the above answers are "NO", then may proceed with Cephalosporin use.   . Clonidine Derivatives Other (See Comments)    Numbness  to tongue face, and arm  . Codeine Itching  . Hydrocodone-Acetaminophen Itching  . Shellfish Allergy Itching  . Sulfa Antibiotics Itching and Rash    Outpatient Encounter Prescriptions as of 06/04/2017  Medication Sig  . amLODipine (NORVASC) 10 MG tablet Take 10 mg by mouth daily.  Marland Kitchen aspirin 325 MG tablet Take 1 tablet (325 mg total) by mouth daily.  . blood glucose meter kit and supplies KIT Dispense based on patient and insurance preference. Use up to four times daily as directed. (FOR ICD-9 250.00, 250.01).  . carvedilol (COREG) 6.25 MG tablet Take 1 tablet (6.25 mg total) by mouth 2 (two) times daily with a meal.  . doxazosin (CARDURA) 4 MG tablet Take 1 tablet (4 mg total) by mouth daily.  . fenofibrate 160 MG tablet Take 160 mg by mouth daily.  . fexofenadine (ALLEGRA) 180 MG tablet Take 180 mg by mouth daily.  Marland Kitchen gabapentin (NEURONTIN) 100 MG capsule Take 200 mg by mouth at bedtime.   . hydrALAZINE (APRESOLINE) 100 MG tablet Take 1 tablet (100 mg total) by mouth every 8 (eight) hours.  . insulin glargine (LANTUS) 100 UNIT/ML injection Inject 0.12 mLs (12 Units total) into the skin daily.  Marland Kitchen omeprazole (PRILOSEC) 20 MG capsule Take 20 mg by mouth daily.  . ondansetron (ZOFRAN) 4 MG tablet Take 4 mg by mouth every 8 (eight) hours as needed for nausea or vomiting.  . potassium chloride SA (K-DUR,KLOR-CON) 20 MEQ tablet Take 40 mEq by mouth daily.  . sitaGLIPtin (JANUVIA) 25 MG tablet Take 25 mg by mouth daily.  Marland Kitchen torsemide (DEMADEX) 20 MG tablet Take 60 mg by mouth daily.   . [DISCONTINUED] Probiotic Product (RISA-BID PROBIOTIC) TABS Take 1 tablet by mouth twice a day until 06/03/2017   No facility-administered encounter medications on file as of 06/04/2017.     Review of Systems   In general does not complaining a fever chills says she feels  stronger and is happy to be going home.  Skin does not complain of rashes or itching.  Head ears eyes nose mouth and throat does have a history of left eye blindness and blurriness intermitted of right eye which is not new.  Respiratory at times will complain of shortness of breath with exertion but apparently this has improved he still oxygen dependent does not complain of increased cough from baseline.  Cardiac does not complain of chest pain does have lower extremity edema which has improved somewhat during her stay here.  GI does not complain of abdominal discomfort had complained of nausea but apparently this has improved some.  Does not complain of vomiting or diarrhea constipation.  Musculoskeletal has continued weakness  which has improved is not really complaining of joint pain at this time.  Neurologic does not complain of dizziness headache syncope does have a history of neuropathy most notably of her hands and she does wear gloves.  Psych does not complain of overt anxiety or depression appears to be in good spirits is glad to be going home.    Immunization History  Administered Date(s) Administered  . Influenza,inj,Quad PF,6+ Mos 08/04/2013  . Pneumococcal Polysaccharide-23 08/04/2013   Pertinent  Health Maintenance Due  Topic Date Due  . OPHTHALMOLOGY EXAM  06/12/2017 (Originally 06/19/1961)  . URINE MICROALBUMIN  06/12/2017 (Originally 06/19/1961)  . FOOT EXAM  06/13/2017 (Originally 06/19/1961)  . MAMMOGRAM  06/13/2017 (Originally 06/19/2003)  . INFLUENZA VACCINE  09/11/2017 (Originally 05/12/2017)  . PAP SMEAR  09/11/2017 (Originally 06/18/1974)  . COLONOSCOPY  09/11/2017 (Originally 06/19/2003)  . HEMOGLOBIN A1C  11/01/2017   No flowsheet data found. Functional Status Survey:    Vitals:   06/04/17 1448  BP: (!) 149/61  Pulse: 72  Resp: 20  Temp: 98.1 F (36.7 C)  TempSrc: Oral  SpO2: 93%    Physical Exam   In general this is a pleasant obese female in no  distress lying comfortably in bed.  Her skin is warm and dry.  Oropharynx is clear her mucous membranes are moist.  Eyes she does have a history of left eye blindness-but visual acuity right eye appears to be grossly intact pupils are reactive to light.   Chest is clear to auscultation with somewhat shallow air entry.  There is no labored breathing.  Heart is regular rate and rhythm without murmur gallop or rub she has L would say 2+ lower extremity edema which has improved somewhat during her stay here pedal pulses are intact bilaterally.  Her abdomen is obese soft nontender with positive bowel sounds.  Musculoskeletal does move all extremities 4 now is ambulatory with a walker has gained strength but still has some lower extremity weakness.  Neurologic is grossly intact her speech is clear no lateralizing findings.  Psych she is alert and oriented pleasant and appropriate  Labs reviewed:  Recent Labs  04/30/17 1740  05/05/17 0655  05/07/17 0415 05/08/17 0559  05/25/17 0730 05/31/17 0630 06/03/17 0707  NA 138  < > 136  < > 138 137  < > 137 138 137  K 2.8*  < > 4.2  < > 3.9 3.6  < > 4.2 3.5 3.6  CL 104  < > 106  < > 106 102  < > 95* 98* 98*  CO2 23  < > 23  < > 24 26  < > 34* 32 32  GLUCOSE 41*  < > 160*  < > 149* 172*  < > 130* 126* 110*  BUN 30*  < > 39*  < > 40* 41*  < > 37* 32* 33*  CREATININE 1.57*  < > 1.57*  < > 1.69* 1.69*  < > 2.33* 2.02* 1.95*  CALCIUM 9.0  < > 8.8*  < > 9.0 8.9  < > 8.9 8.5* 8.6*  MG 2.3  --   --   --   --   --   --   --   --   --   PHOS  --   --  4.2  --  4.7* 4.8*  --   --   --   --   < > = values in this interval not displayed.  Recent Labs  05/13/17 1520 05/13/17 2130 05/31/17 0630  AST _0 ALT 11* 11* 12*  ALKPHOS 44 43 30*  BILITOT 1.0 0.8 0.8  PROT 6.5 6.6 6.1*  ALBUMIN 2.9* 3.1* 2.9*    Recent Labs  05/13/17 1520 05/13/17 2130 05/31/17 0630 06/03/17 0707  WBC 5.5 5.9 3.3* 2.9*  NEUTROABS 4.0 4.5  --  1.8    HGB 11.7* 11.6* 10.8* 10.6*  HCT 36.1 36.2 34.1* 33.8*  MCV 90.9 91.2 93.2 92.3  PLT 166 169 137* 146*   Lab Results  Component Value Date   TSH 3.612 05/01/2017   Lab Results  Component Value Date   HGBA1C 5.6 05/01/2017   Lab Results  Component Value Date   CHOL 179 08/03/2013   HDL 29 (L) 08/03/2013   LDLCALC 81 08/03/2013   TRIG 344 (H) 08/03/2013   CHOLHDL 6.2 08/03/2013    Significant Diagnostic Results in last 30 days:  Dg Chest 2 View  Result Date: 05/13/2017 CLINICAL DATA:  Shortness of breath and edema. Discharged on 05/08/2017 with new acute renal failure and weight gain of 20 pounds steering hospitalization. Oxygen saturations are decreasing today. EXAM: CHEST  2 VIEW COMPARISON:  05/06/2017 FINDINGS: Cardiac enlargement with mild pulmonary vascular congestion. Perihilar infiltrates, greater on the right consistent with edema. Increasing opacity in the right lung base may represent asymmetrical edema or superimposed pneumonia. Small bilateral pleural effusions. No pneumothorax. Calcified and tortuous aorta. Linear atelectasis or fibrosis in the left mid lung. Degenerative changes in the spine. IMPRESSION: Cardiac enlargement with pulmonary vascular congestion and perihilar edema. Bilateral pleural effusions. Asymmetrical edema versus pneumonia in the right lung base. Electronically Signed   By: Lucienne Capers M.D.   On: 05/13/2017 21:05   Dg Chest Port 1 View  Result Date: 05/06/2017 CLINICAL DATA:  Shortness of breath, history of diabetes, hypertension, acute renal injury, former smoker. EXAM: PORTABLE CHEST 1 VIEW COMPARISON:  PA and lateral chest x-ray of April 30, 2017 FINDINGS: The lungs are well-expanded. The interstitial markings are coarse and more conspicuous today. There is no pleural effusion or pneumothorax. The cardiac silhouette is enlarged. The central pulmonary vascularity is prominent but stable. There is no definite cephalization. The bony thorax exhibits  no acute abnormality. IMPRESSION: Chronic cardiomegaly without significant pulmonary vascular congestion. Increased prominence of the pulmonary interstitium may reflect mild interstitial edema or less likely pneumonia. Thoracic aortic atherosclerosis. A PA and lateral chest x-ray would be useful. Electronically Signed   By: David  Martinique M.D.   On: 05/06/2017 08:03  1 is a  Assessment/Plan  1 history of diastolic CHF she appears to be stable on the Demadex 60 mg a day she has lost approximately 11 pounds since her admission Estrace status appears to be improved although she still requires oxygen-will need expedient follow-up by primary care provider but this appears to have stabilized.  #2 history of chronic kidney disease this appears to have improved with creatinine 1.95 on lab done this week will need follow-up with a lab drawn by home health next week and primary care provider notified of results.--We will need follow-up by nephrology  #3 diabetes type 2 again she had significant hypoglycemia requiring hospital admission recently-this has really stabilized it appears with blood sugars largely in the lower mid 100s on Januvia as well as Lantus at this point will continue current doses.  #4-#4 history of ischemic stroke continues on aspirin and fenofibrate-not on a statin will defer follow-up to primary care provider clinically  has gained strength.  #5 history of neuropathy she does continue on Neurontin  #6 history of unstable balance and weakness-again they're still or concerns with her weakness not being able to walk without a walker.  This has been assessed previously by Dr. Lyndel Safe.  Therapy has says she is peak with her progress here.  Per Dr. Lyndel Safe will need follow-up with neurology a repeat MRI this will have to be done obviously hasn't outpatient.  #7-hypertension-she continues on numerous medications including Norvasc-hydralazine-Coreg-and Cardura-blood pressures appear to run  systolically from the 558P to 167O with diastolics in the 25L to 25G-FUQXA she is about to go home would be hesitant to make medication changes will defer to primary care provider.  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  She will need to nursing support follow-up her multiple medical issues she has all her DME at home.  She will need follow up by primary care provider as well as neurology.  Home health will need to check a CBC next week as well as metabolic panel keep an eye on her renal function as well as mild leukopenia  [id number]-of note greater than 30 minutes spent on this discharge summary-greater than 50% of time spent coordinating plan of care for numerous diagnoses

## 2017-06-07 ENCOUNTER — Encounter (HOSPITAL_COMMUNITY)
Admission: RE | Admit: 2017-06-07 | Discharge: 2017-06-07 | Disposition: A | Discharge status: Home / Self Care | Payer: Medicare Other | Source: Skilled Nursing Facility | Attending: Internal Medicine | Admitting: Internal Medicine

## 2017-06-07 DIAGNOSIS — I10 Essential (primary) hypertension: Secondary | ICD-10-CM | POA: Diagnosis not present

## 2017-06-07 DIAGNOSIS — R2689 Other abnormalities of gait and mobility: Secondary | ICD-10-CM | POA: Diagnosis not present

## 2017-06-07 DIAGNOSIS — R296 Repeated falls: Secondary | ICD-10-CM | POA: Diagnosis not present

## 2017-06-07 DIAGNOSIS — Z9181 History of falling: Secondary | ICD-10-CM | POA: Diagnosis not present

## 2017-06-07 DIAGNOSIS — E876 Hypokalemia: Secondary | ICD-10-CM | POA: Diagnosis not present

## 2017-06-07 DIAGNOSIS — E119 Type 2 diabetes mellitus without complications: Secondary | ICD-10-CM | POA: Diagnosis not present

## 2017-06-08 DIAGNOSIS — R296 Repeated falls: Secondary | ICD-10-CM | POA: Diagnosis not present

## 2017-06-08 DIAGNOSIS — I10 Essential (primary) hypertension: Secondary | ICD-10-CM | POA: Diagnosis not present

## 2017-06-08 DIAGNOSIS — R2689 Other abnormalities of gait and mobility: Secondary | ICD-10-CM | POA: Diagnosis not present

## 2017-06-08 DIAGNOSIS — E876 Hypokalemia: Secondary | ICD-10-CM | POA: Diagnosis not present

## 2017-06-08 DIAGNOSIS — E119 Type 2 diabetes mellitus without complications: Secondary | ICD-10-CM | POA: Diagnosis not present

## 2017-06-08 DIAGNOSIS — Z9181 History of falling: Secondary | ICD-10-CM | POA: Diagnosis not present

## 2017-06-10 ENCOUNTER — Emergency Department (HOSPITAL_COMMUNITY): Payer: Medicare Other

## 2017-06-10 ENCOUNTER — Encounter (HOSPITAL_COMMUNITY): Payer: Self-pay | Admitting: Emergency Medicine

## 2017-06-10 ENCOUNTER — Inpatient Hospital Stay (HOSPITAL_COMMUNITY)
Admission: EM | Admit: 2017-06-10 | Discharge: 2017-06-17 | DRG: 291 | Disposition: A | Discharge status: Home Health | Payer: Medicare Other | Attending: Internal Medicine | Admitting: Internal Medicine

## 2017-06-10 DIAGNOSIS — R7989 Other specified abnormal findings of blood chemistry: Secondary | ICD-10-CM

## 2017-06-10 DIAGNOSIS — Z885 Allergy status to narcotic agent status: Secondary | ICD-10-CM

## 2017-06-10 DIAGNOSIS — Z880 Allergy status to penicillin: Secondary | ICD-10-CM

## 2017-06-10 DIAGNOSIS — Z888 Allergy status to other drugs, medicaments and biological substances status: Secondary | ICD-10-CM

## 2017-06-10 DIAGNOSIS — N3000 Acute cystitis without hematuria: Secondary | ICD-10-CM | POA: Diagnosis present

## 2017-06-10 DIAGNOSIS — Z87442 Personal history of urinary calculi: Secondary | ICD-10-CM | POA: Diagnosis not present

## 2017-06-10 DIAGNOSIS — R262 Difficulty in walking, not elsewhere classified: Secondary | ICD-10-CM | POA: Diagnosis present

## 2017-06-10 DIAGNOSIS — Z8249 Family history of ischemic heart disease and other diseases of the circulatory system: Secondary | ICD-10-CM | POA: Diagnosis not present

## 2017-06-10 DIAGNOSIS — F0390 Unspecified dementia without behavioral disturbance: Secondary | ICD-10-CM | POA: Diagnosis present

## 2017-06-10 DIAGNOSIS — J181 Lobar pneumonia, unspecified organism: Secondary | ICD-10-CM | POA: Diagnosis not present

## 2017-06-10 DIAGNOSIS — I130 Hypertensive heart and chronic kidney disease with heart failure and stage 1 through stage 4 chronic kidney disease, or unspecified chronic kidney disease: Secondary | ICD-10-CM | POA: Diagnosis not present

## 2017-06-10 DIAGNOSIS — Z9981 Dependence on supplemental oxygen: Secondary | ICD-10-CM

## 2017-06-10 DIAGNOSIS — I10 Essential (primary) hypertension: Secondary | ICD-10-CM | POA: Diagnosis not present

## 2017-06-10 DIAGNOSIS — G9340 Encephalopathy, unspecified: Secondary | ICD-10-CM | POA: Diagnosis not present

## 2017-06-10 DIAGNOSIS — Z91013 Allergy to seafood: Secondary | ICD-10-CM

## 2017-06-10 DIAGNOSIS — E1122 Type 2 diabetes mellitus with diabetic chronic kidney disease: Secondary | ICD-10-CM | POA: Diagnosis present

## 2017-06-10 DIAGNOSIS — R778 Other specified abnormalities of plasma proteins: Secondary | ICD-10-CM

## 2017-06-10 DIAGNOSIS — Z90710 Acquired absence of both cervix and uterus: Secondary | ICD-10-CM | POA: Diagnosis not present

## 2017-06-10 DIAGNOSIS — R4182 Altered mental status, unspecified: Secondary | ICD-10-CM | POA: Diagnosis not present

## 2017-06-10 DIAGNOSIS — L89152 Pressure ulcer of sacral region, stage 2: Secondary | ICD-10-CM | POA: Diagnosis present

## 2017-06-10 DIAGNOSIS — G8929 Other chronic pain: Secondary | ICD-10-CM | POA: Diagnosis present

## 2017-06-10 DIAGNOSIS — Z833 Family history of diabetes mellitus: Secondary | ICD-10-CM

## 2017-06-10 DIAGNOSIS — R402421 Glasgow coma scale score 9-12, in the field [EMT or ambulance]: Secondary | ICD-10-CM | POA: Diagnosis not present

## 2017-06-10 DIAGNOSIS — I248 Other forms of acute ischemic heart disease: Secondary | ICD-10-CM | POA: Diagnosis not present

## 2017-06-10 DIAGNOSIS — N183 Chronic kidney disease, stage 3 (moderate): Secondary | ICD-10-CM | POA: Diagnosis present

## 2017-06-10 DIAGNOSIS — Z7401 Bed confinement status: Secondary | ICD-10-CM

## 2017-06-10 DIAGNOSIS — I5033 Acute on chronic diastolic (congestive) heart failure: Secondary | ICD-10-CM | POA: Diagnosis not present

## 2017-06-10 DIAGNOSIS — L89153 Pressure ulcer of sacral region, stage 3: Secondary | ICD-10-CM | POA: Diagnosis not present

## 2017-06-10 DIAGNOSIS — B9620 Unspecified Escherichia coli [E. coli] as the cause of diseases classified elsewhere: Secondary | ICD-10-CM | POA: Diagnosis present

## 2017-06-10 DIAGNOSIS — L8990 Pressure ulcer of unspecified site, unspecified stage: Secondary | ICD-10-CM | POA: Insufficient documentation

## 2017-06-10 DIAGNOSIS — Z7982 Long term (current) use of aspirin: Secondary | ICD-10-CM

## 2017-06-10 DIAGNOSIS — R5381 Other malaise: Secondary | ICD-10-CM | POA: Diagnosis present

## 2017-06-10 DIAGNOSIS — F4489 Other dissociative and conversion disorders: Secondary | ICD-10-CM | POA: Diagnosis not present

## 2017-06-10 DIAGNOSIS — M549 Dorsalgia, unspecified: Secondary | ICD-10-CM | POA: Diagnosis present

## 2017-06-10 DIAGNOSIS — I5031 Acute diastolic (congestive) heart failure: Secondary | ICD-10-CM | POA: Diagnosis not present

## 2017-06-10 DIAGNOSIS — R279 Unspecified lack of coordination: Secondary | ICD-10-CM | POA: Diagnosis not present

## 2017-06-10 DIAGNOSIS — D631 Anemia in chronic kidney disease: Secondary | ICD-10-CM | POA: Diagnosis present

## 2017-06-10 DIAGNOSIS — R748 Abnormal levels of other serum enzymes: Secondary | ICD-10-CM

## 2017-06-10 DIAGNOSIS — E1142 Type 2 diabetes mellitus with diabetic polyneuropathy: Secondary | ICD-10-CM | POA: Diagnosis present

## 2017-06-10 DIAGNOSIS — E119 Type 2 diabetes mellitus without complications: Secondary | ICD-10-CM

## 2017-06-10 DIAGNOSIS — Z794 Long term (current) use of insulin: Secondary | ICD-10-CM

## 2017-06-10 DIAGNOSIS — J9611 Chronic respiratory failure with hypoxia: Secondary | ICD-10-CM | POA: Diagnosis not present

## 2017-06-10 DIAGNOSIS — Z882 Allergy status to sulfonamides status: Secondary | ICD-10-CM

## 2017-06-10 DIAGNOSIS — Z87891 Personal history of nicotine dependence: Secondary | ICD-10-CM | POA: Diagnosis not present

## 2017-06-10 DIAGNOSIS — N179 Acute kidney failure, unspecified: Secondary | ICD-10-CM | POA: Diagnosis present

## 2017-06-10 DIAGNOSIS — R410 Disorientation, unspecified: Secondary | ICD-10-CM | POA: Diagnosis not present

## 2017-06-10 DIAGNOSIS — I509 Heart failure, unspecified: Secondary | ICD-10-CM | POA: Diagnosis not present

## 2017-06-10 DIAGNOSIS — Z79899 Other long term (current) drug therapy: Secondary | ICD-10-CM

## 2017-06-10 LAB — COMPREHENSIVE METABOLIC PANEL
ALK PHOS: 37 U/L — AB (ref 38–126)
ALT: 14 U/L (ref 14–54)
AST: 31 U/L (ref 15–41)
Albumin: 3.4 g/dL — ABNORMAL LOW (ref 3.5–5.0)
Anion gap: 10 (ref 5–15)
BUN: 44 mg/dL — AB (ref 6–20)
CALCIUM: 8.8 mg/dL — AB (ref 8.9–10.3)
CO2: 28 mmol/L (ref 22–32)
CREATININE: 2.81 mg/dL — AB (ref 0.44–1.00)
Chloride: 100 mmol/L — ABNORMAL LOW (ref 101–111)
GFR calc Af Amer: 19 mL/min — ABNORMAL LOW (ref >60)
GFR calc non Af Amer: 17 mL/min — ABNORMAL LOW (ref >60)
GLUCOSE: 173 mg/dL — AB (ref 65–99)
Potassium: 3.8 mmol/L (ref 3.5–5.1)
SODIUM: 138 mmol/L (ref 135–145)
Total Bilirubin: 1.2 mg/dL (ref 0.3–1.2)
Total Protein: 7.5 g/dL (ref 6.5–8.1)

## 2017-06-10 LAB — URINALYSIS, ROUTINE W REFLEX MICROSCOPIC
Bilirubin Urine: NEGATIVE
GLUCOSE, UA: NEGATIVE mg/dL
Ketones, ur: NEGATIVE mg/dL
Nitrite: NEGATIVE
PROTEIN: 100 mg/dL — AB
Specific Gravity, Urine: 1.01 (ref 1.005–1.030)
pH: 5 (ref 5.0–8.0)

## 2017-06-10 LAB — CBC WITH DIFFERENTIAL/PLATELET
BASOS PCT: 0 %
Basophils Absolute: 0 10*3/uL (ref 0.0–0.1)
EOS ABS: 0 10*3/uL (ref 0.0–0.7)
Eosinophils Relative: 0 %
HCT: 32.7 % — ABNORMAL LOW (ref 36.0–46.0)
Hemoglobin: 10.3 g/dL — ABNORMAL LOW (ref 12.0–15.0)
Lymphocytes Relative: 10 %
Lymphs Abs: 0.5 10*3/uL — ABNORMAL LOW (ref 0.7–4.0)
MCH: 28.9 pg (ref 26.0–34.0)
MCHC: 31.5 g/dL (ref 30.0–36.0)
MCV: 91.9 fL (ref 78.0–100.0)
MONO ABS: 0.2 10*3/uL (ref 0.1–1.0)
MONOS PCT: 4 %
Neutro Abs: 4 10*3/uL (ref 1.7–7.7)
Neutrophils Relative %: 86 %
Platelets: 155 10*3/uL (ref 150–400)
RBC: 3.56 MIL/uL — ABNORMAL LOW (ref 3.87–5.11)
RDW: 15.8 % — AB (ref 11.5–15.5)
WBC: 4.7 10*3/uL (ref 4.0–10.5)

## 2017-06-10 LAB — PROTIME-INR
INR: 1.14
PROTHROMBIN TIME: 14.5 s (ref 11.4–15.2)

## 2017-06-10 LAB — GLUCOSE, CAPILLARY
GLUCOSE-CAPILLARY: 174 mg/dL — AB (ref 65–99)
GLUCOSE-CAPILLARY: 248 mg/dL — AB (ref 65–99)

## 2017-06-10 LAB — I-STAT TROPONIN, ED: TROPONIN I, POC: 0.14 ng/mL — AB (ref 0.00–0.08)

## 2017-06-10 LAB — BRAIN NATRIURETIC PEPTIDE: B NATRIURETIC PEPTIDE 5: 788 pg/mL — AB (ref 0.0–100.0)

## 2017-06-10 LAB — MRSA PCR SCREENING: MRSA by PCR: NEGATIVE

## 2017-06-10 LAB — STREP PNEUMONIAE URINARY ANTIGEN: Strep Pneumo Urinary Antigen: NEGATIVE

## 2017-06-10 LAB — I-STAT CG4 LACTIC ACID, ED: LACTIC ACID, VENOUS: 0.61 mmol/L (ref 0.5–1.9)

## 2017-06-10 LAB — TROPONIN I
Troponin I: 0.52 ng/mL (ref <0.03)
Troponin I: 0.39 ng/mL (ref <0.03)

## 2017-06-10 MED ORDER — SODIUM CHLORIDE 0.9% FLUSH
3.0000 mL | INTRAVENOUS | Status: DC | PRN
  Administered 2017-06-12: 3 mL via INTRAVENOUS
  Filled 2017-06-10: qty 3

## 2017-06-10 MED ORDER — SODIUM CHLORIDE 0.9% FLUSH
3.0000 mL | Freq: Two times a day (BID) | INTRAVENOUS | Status: DC
  Administered 2017-06-10 – 2017-06-14 (×6): 3 mL via INTRAVENOUS

## 2017-06-10 MED ORDER — PREDNISONE 20 MG PO TABS
40.0000 mg | ORAL_TABLET | Freq: Every day | ORAL | Status: AC
  Administered 2017-06-11 – 2017-06-14 (×4): 40 mg via ORAL
  Filled 2017-06-10 (×5): qty 2

## 2017-06-10 MED ORDER — PANTOPRAZOLE SODIUM 40 MG PO TBEC
40.0000 mg | DELAYED_RELEASE_TABLET | Freq: Every day | ORAL | Status: DC
  Administered 2017-06-10 – 2017-06-17 (×7): 40 mg via ORAL
  Filled 2017-06-10 (×8): qty 1

## 2017-06-10 MED ORDER — VANCOMYCIN HCL IN DEXTROSE 1-5 GM/200ML-% IV SOLN
1000.0000 mg | INTRAVENOUS | Status: DC
  Administered 2017-06-10 – 2017-06-11 (×2): 1000 mg via INTRAVENOUS
  Filled 2017-06-10 (×2): qty 200

## 2017-06-10 MED ORDER — SODIUM CHLORIDE 0.9 % IV SOLN
250.0000 mL | INTRAVENOUS | Status: DC | PRN
  Administered 2017-06-10: 250 mL via INTRAVENOUS

## 2017-06-10 MED ORDER — METHYLPREDNISOLONE SODIUM SUCC 40 MG IJ SOLR
40.0000 mg | Freq: Once | INTRAMUSCULAR | Status: AC
  Administered 2017-06-10: 40 mg via INTRAVENOUS
  Filled 2017-06-10: qty 1

## 2017-06-10 MED ORDER — ACETAMINOPHEN 650 MG RE SUPP: 650.0000 mg | Freq: Four times a day (QID) | RECTAL | Status: DC | PRN

## 2017-06-10 MED ORDER — INSULIN GLARGINE 100 UNIT/ML ~~LOC~~ SOLN
12.0000 [IU] | Freq: Every day | SUBCUTANEOUS | Status: DC
  Administered 2017-06-10 – 2017-06-16 (×7): 12 [IU] via SUBCUTANEOUS
  Filled 2017-06-10 (×8): qty 0.12

## 2017-06-10 MED ORDER — FUROSEMIDE 10 MG/ML IJ SOLN
40.0000 mg | Freq: Once | INTRAMUSCULAR | Status: AC
  Administered 2017-06-10: 40 mg via INTRAVENOUS
  Filled 2017-06-10: qty 4

## 2017-06-10 MED ORDER — DEXTROSE 5 % IV SOLN: 2.0000 g | Freq: Three times a day (TID) | INTRAVENOUS | Status: DC

## 2017-06-10 MED ORDER — ACETAMINOPHEN 325 MG PO TABS: 650.0000 mg | ORAL_TABLET | Freq: Four times a day (QID) | ORAL | Status: DC | PRN

## 2017-06-10 MED ORDER — GABAPENTIN 100 MG PO CAPS
200.0000 mg | ORAL_CAPSULE | Freq: Every day | ORAL | Status: DC
  Administered 2017-06-10 – 2017-06-16 (×7): 200 mg via ORAL
  Filled 2017-06-10 (×7): qty 2

## 2017-06-10 MED ORDER — DOXAZOSIN MESYLATE 2 MG PO TABS
4.0000 mg | ORAL_TABLET | Freq: Every day | ORAL | Status: DC
  Administered 2017-06-10 – 2017-06-17 (×7): 4 mg via ORAL
  Filled 2017-06-10 (×8): qty 2

## 2017-06-10 MED ORDER — DEXTROSE 5 % IV SOLN
2.0000 g | Freq: Once | INTRAVENOUS | Status: AC
  Administered 2017-06-10: 2 g via INTRAVENOUS
  Filled 2017-06-10: qty 2

## 2017-06-10 MED ORDER — SODIUM CHLORIDE 0.9% FLUSH
3.0000 mL | Freq: Two times a day (BID) | INTRAVENOUS | Status: DC
  Administered 2017-06-10 – 2017-06-17 (×12): 3 mL via INTRAVENOUS

## 2017-06-10 MED ORDER — VANCOMYCIN HCL IN DEXTROSE 1-5 GM/200ML-% IV SOLN
1000.0000 mg | Freq: Once | INTRAVENOUS | Status: AC
  Administered 2017-06-10: 1000 mg via INTRAVENOUS
  Filled 2017-06-10: qty 200

## 2017-06-10 MED ORDER — INSULIN ASPART 100 UNIT/ML ~~LOC~~ SOLN
0.0000 [IU] | Freq: Three times a day (TID) | SUBCUTANEOUS | Status: DC
  Administered 2017-06-10: 2 [IU] via SUBCUTANEOUS
  Administered 2017-06-11: 3 [IU] via SUBCUTANEOUS
  Administered 2017-06-11: 1 [IU] via SUBCUTANEOUS
  Administered 2017-06-11: 2 [IU] via SUBCUTANEOUS
  Administered 2017-06-12: 1 [IU] via SUBCUTANEOUS
  Administered 2017-06-12 – 2017-06-13 (×2): 3 [IU] via SUBCUTANEOUS
  Administered 2017-06-13: 1 [IU] via SUBCUTANEOUS
  Administered 2017-06-14 – 2017-06-15 (×3): 2 [IU] via SUBCUTANEOUS
  Administered 2017-06-15: 1 [IU] via SUBCUTANEOUS
  Administered 2017-06-16 (×2): 2 [IU] via SUBCUTANEOUS
  Administered 2017-06-17: 1 [IU] via SUBCUTANEOUS

## 2017-06-10 MED ORDER — ASPIRIN 325 MG PO TABS
325.0000 mg | ORAL_TABLET | Freq: Every day | ORAL | Status: DC
  Administered 2017-06-10 – 2017-06-17 (×7): 325 mg via ORAL
  Filled 2017-06-10 (×8): qty 1

## 2017-06-10 MED ORDER — DEXTROSE 5 % IV SOLN
1.0000 g | INTRAVENOUS | Status: DC
  Administered 2017-06-11 – 2017-06-13 (×3): 1 g via INTRAVENOUS
  Filled 2017-06-10 (×4): qty 1

## 2017-06-10 MED ORDER — ENOXAPARIN SODIUM 30 MG/0.3ML ~~LOC~~ SOLN
30.0000 mg | SUBCUTANEOUS | Status: DC
  Administered 2017-06-10 – 2017-06-12 (×3): 30 mg via SUBCUTANEOUS
  Filled 2017-06-10 (×4): qty 0.3

## 2017-06-10 MED ORDER — CARVEDILOL 3.125 MG PO TABS
6.2500 mg | ORAL_TABLET | Freq: Two times a day (BID) | ORAL | Status: DC
  Administered 2017-06-10 – 2017-06-17 (×13): 6.25 mg via ORAL
  Filled 2017-06-10 (×14): qty 2

## 2017-06-10 MED ORDER — FUROSEMIDE 10 MG/ML IJ SOLN
40.0000 mg | Freq: Two times a day (BID) | INTRAMUSCULAR | Status: DC
  Administered 2017-06-10 – 2017-06-17 (×14): 40 mg via INTRAVENOUS
  Filled 2017-06-10 (×14): qty 4

## 2017-06-10 NOTE — H&P (Signed)
History and Physical  JAZZ BIDDY SEG:[id number] DOB: 1951/07/09 DOA: 06/10/2017  PCP: Celene Squibb, MD  Patient coming from: home  Chief Complaint: Confusion  HPI:  66 year old woman PMH diabetes mellitus type 2, stroke, debility, recently discharged from skilled nursing facility where she was placed for short-term rehabilitation following hospitalization for acute on chronic diastolic CHF, hypoglycemia, acute kidney injury who presented to the emergency department with reported fever and confusion. Admitted for lobar pneumonia, UTI, acute on chronic diastolic congestive heart failure.  All history obtained from husband and daughter at bedside. Patient has what is by description likely chronic delirium or dementia and is unable to provide history as at baseline she denies any complaints. Family reports that when she came home from the skilled nursing facility she was able to walk with great difficulty into the house requiring significant assistance. Over the course of several days she has become weaker and weaker and is essentially unable to ambulate or even transfer without falling. The estimated at least 10 falls in the house for the few days they were at home. Her appetite has been fair and she did eat fairly well yesterday. They're unaware of any particular symptoms and reported the patient will deny any complaints. Today she was more lethargic than usual and had a low-grade fever.  Patient has not walked since March of this year, legs are very weak and unable to bear weight for 5 months now. She spends most of the day sleeping, requires assistance with bathing and dressing but is typically able to feed herself. She has episodes of confusion and episodes of lucidity at baseline.  ED Course: Afebrile stable vital signs, treated with cefepime and vancomycin, Lasix  Review of Systems:  Unreliable as the patient denies any complaints. Per family no noted fever, reported visual changes, sore  throat, rash, new muscle aches, chest pain, shortness of breath, dysuria, bleeding, vomiting.   Past Medical History:  Diagnosis Date  . Abdominal aneurysm ([place])   . Bone spur   . Chronic back pain   . CVA (cerebral infarction)   . Diabetes mellitus without complication ([place])   . Hypertension   . Kidney stones   . Neuropathy   . Psoriasis   . Stroke ([place])   . Wound, open, foot     Past Surgical History:  Procedure Laterality Date  . ABDOMINAL HYSTERECTOMY    . BLADDER SUSPENSION    . VESICOVAGINAL FISTULA CLOSURE W/ TAH       reports that she has quit smoking. She has a 30.00 pack-year smoking history. She has never used smokeless tobacco. She reports that she does not drink alcohol or use drugs. Mobility: Essentially bedbound  Allergies  Allergen Reactions  . Glipizide     Severe hypoglycemia (22!)  . Penicillins Itching    Has patient had a PCN reaction causing immediate rash, facial/tongue/throat swelling, SOB or lightheadedness with hypotension: Yes Has patient had a PCN reaction causing severe rash involving mucus membranes or skin necrosis: No Has patient had a PCN reaction that required hospitalization: No Has patient had a PCN reaction occurring within the last 10 years: No If all of the above answers are "NO", then may proceed with Cephalosporin use.   . Clonidine Derivatives Other (See Comments)    Numbness  to tongue face, and arm  . Codeine Itching  . Hydrocodone-Acetaminophen Itching  . Shellfish Allergy Itching  . Sulfa Antibiotics Itching and Rash    Family History  Problem Relation  Age of Onset  . Aneurysm Mother   . Diabetes Mother   . Hypertension Mother   . Cancer Neg Hx   . Heart disease Neg Hx   . Stroke Neg Hx      Prior to Admission medications   Medication Sig Start Date End Date Taking? Authorizing Provider  amLODipine (NORVASC) 10 MG tablet Take 10 mg by mouth daily.   Yes [provider]  aspirin 325 MG tablet Take 1  tablet (325 mg total) by mouth daily. 05/07/17  Yes Erline Hau, MD  blood glucose meter kit and supplies KIT Dispense based on patient and insurance preference. Use up to four times daily as directed. (FOR ICD-9 250.00, 250.01). 03/24/15  Yes Samuella Cota, MD  carvedilol (COREG) 6.25 MG tablet Take 1 tablet (6.25 mg total) by mouth 2 (two) times daily with a meal. 05/06/17  Yes Isaac Bliss, Rayford Halsted, MD  doxazosin (CARDURA) 4 MG tablet Take 1 tablet (4 mg total) by mouth daily. 05/09/17  Yes Reyne Dumas, MD  fenofibrate 160 MG tablet Take 160 mg by mouth daily.   Yes [provider]  fexofenadine (ALLEGRA) 180 MG tablet Take 180 mg by mouth daily.   Yes [provider]  gabapentin (NEURONTIN) 100 MG capsule Take 200 mg by mouth at bedtime.    Yes [provider]  hydrALAZINE (APRESOLINE) 100 MG tablet Take 1 tablet (100 mg total) by mouth every 8 (eight) hours. 05/08/17  Yes Reyne Dumas, MD  insulin glargine (LANTUS) 100 UNIT/ML injection Inject 0.12 mLs (12 Units total) into the skin daily. 05/09/17  Yes Reyne Dumas, MD  omeprazole (PRILOSEC) 20 MG capsule Take 20 mg by mouth daily.   Yes [provider]  ondansetron (ZOFRAN) 4 MG tablet Take 4 mg by mouth every 8 (eight) hours as needed for nausea or vomiting.   Yes [provider]  potassium chloride SA (K-DUR,KLOR-CON) 20 MEQ tablet Take 40 mEq by mouth daily.   Yes [provider]  sitaGLIPtin (JANUVIA) 25 MG tablet Take 25 mg by mouth daily.   Yes [provider]  torsemide (DEMADEX) 20 MG tablet Take 60 mg by mouth daily.    Yes [provider]    Physical Exam:   Constitutional. 98.2, 17, 67, 161/42, 96% on 4 L nasal cannula. Appears calm, comfortable, chronically ill. Nontoxic.  Psychiatric. Alert, speech fluent and clear, follows simple commands. Cannot assess judgment and insight.  Eyes. Pupils, irises, lids appear  unremarkable.  ENT. Grossly normal hearing, lips, tongue.  Neck. No lymphadenopathy or masses. No thyromegaly noted.  Cardiovascular. Regular rate and rhythm. No murmur, rub or gallop. 3+ bilateral lower extremity edema.  Respiratory. Clear to auscultation bilaterally. No wheezes, rales or rhonchi. Normal respiratory effort.  Abdomen. Soft, nontender, nondistended. Obese. No hepatomegaly noted.  Skin. No rash or induration. Nontender to palpation. There is an old bruise over the chest superior to the manubrium  Musculoskeletal. Globally weak. Moves all extremities to command.  Wt Readings from Last 3 Encounters:  06/10/17 96 kg (211 lb 10.3 oz)  05/02/17 94.3 kg (207 lb 14.3 oz)  05/23/15 81.6 kg (180 lb)    I have personally reviewed following labs and imaging studies  Labs:   BUN 44, creatinine 2.81. Remainder basic metabolic panel unremarkable. LFTs unremarkable.  BNP 788, significantly elevated compared to 1 week ago.  Troponin 0.14. Lactic acid within normal limits.  WBC 4.7, hemoglobin 10.3, platelets 155  Urinalysis  too numerous to count white blood cells  Imaging studies:   CT head, no acute abnormalities  Chest x-ray independently reviewed shows left lower lobe pneumonia, CHF  Medical tests:   EKG independently reviewed shows sinus rhythm, no acute changes.    Review and summation of old records:   As above. Additionally LVEF 42-70%, grade 2 diastolic dysfunction noted on echocardiogram July 2018.   Active Problems:   Difficulty walking   AKI (acute kidney injury) ([place])   Acute diastolic heart failure (HCC)   Lobar pneumonia (HCC)   Pressure injury of skin   Elevated troponin   DM type 2 (diabetes mellitus, type 2) (HCC)   Chronic respiratory failure with hypoxia (HCC)   Assessment/Plan UTI without sepsis. -Empiric antibiotics. Follow-up culture data.  Lobar pneumonia left lower lobe. -Sputum culture. Empiric antibiotics. Continue  supplemental oxygen. -Steroids.  Acute on chronic diastolic CHF -Significant volume overload on examination. IV Lasix. Daily BMP.  Elevated troponin -Suspect demand ischemia. EKG nonacute. Patient asymptomatic. Trend troponin.  AKI superimposed on CKD stage III -Suspect relative hypoperfusion of the kidneys the setting of acute on chronic diastolic CHF. Plan IV diuresis. Follow BMP in the morning.  Chronic hypoxic respiratory failure on 2-4 L nasal cannula -Stable. Continue supplemental oxygen.  DM type 2 -Stable. Sliding scale insulin. Continue Lantus.  Debility, near bedbound status -Physical therapy evaluation. Social work [place].  Chronic delirium  Severity of Illness: The appropriate patient status for this patient is INPATIENT. Inpatient status is judged to be reasonable and necessary in order to provide the required intensity of service to ensure the patient's safety. The patient's presenting symptoms, physical exam findings, and initial radiographic and laboratory data in the context of their chronic comorbidities is felt to place them at high risk for further clinical deterioration. Furthermore, it is not anticipated that the patient will be medically stable for discharge from the hospital within 2 midnights of admission. The following factors support the patient status of inpatient.   * I certify that at the point of admission it is my clinical judgment that the patient will require inpatient hospital care spanning beyond 2 midnights from the point of admission due to high intensity of service, high risk for further deterioration and high frequency of surveillance required.*     DVT prophylaxis:enoxaparin Code Status: full Family Communication: husband and daughter at bedside   Time spent: 70 minutes  Murray Hodgkins, MD  [facility] Direct contact: [phone] --[place]  --[email or web address]; password [email or web address]  7PM-7AM contact night coverage as  above  06/10/2017, 2:05 PM

## 2017-06-10 NOTE — ED Triage Notes (Signed)
Pt found altered in her recliner this morning by family.  Did not have oxygen on and sats were in 80s.  Vomit noted to shirt.

## 2017-06-10 NOTE — ED Provider Notes (Signed)
[facility] DEPT Provider Note   CSN: [id number] Arrival date & time: 06/10/17  0909     History   Chief Complaint Chief Complaint  Patient presents with  . Fever  . Altered Mental Status    Madison Reed Madison Reed is a 66 y.o. female.  Madison Reed  Level 5 caveat due to altered mental status. 66 year old female who presents with AMS. History of DM, prior CVA with right side weakness, HTN, diastolic HF with chronic respiratory failure on 3-4 L home oxygen. Found by family this morning in the recliner confused and minimally responsive. EMS reports she was recently treated for UTI. States she felt warm to touch. Patient denies any pain. Disoriented to situation. Denies chest pain, abdominal pain, difficulty breathing.   Past Medical History:  Diagnosis Date  . Abdominal aneurysm ([place])   . Bone spur   . Chronic back pain   . CVA (cerebral infarction)   . Diabetes mellitus without complication ([place])   . Hypertension   . Kidney stones   . Neuropathy   . Psoriasis   . Stroke ([place])   . Wound, open, foot     Patient Active Problem List   Diagnosis Date Noted  . Lobar pneumonia ([place]) 06/10/2017  . Acute diastolic heart failure ([place]) 05/27/2017  . Dyspnea   . AKI (acute kidney injury) ([place]) 04/30/2017  . Hypoglycemia 04/30/2017  . Septic arthritis of IP joint of toe ([place])   . Cellulitis of great toe   . DM type 2, uncontrolled, with lower extremity ulcer ([place]) 03/23/2015  . Diabetic ulcer of left foot ([place])   . Weakness of right arm/hand 09/18/2013  . Unstable balance 08/11/2013  . Foot and toe(s), blister, infected 08/11/2013  . Difficulty in walking(719.7) 08/11/2013  . Weakness of right leg 08/11/2013  . Acute ischemic stroke ([place]) 08/03/2013  . Hypokalemia 08/03/2013  . Type 2 diabetes with nephropathy ([place])   . Hypertension   . Neuropathy   . Chronic back pain   . Psoriasis   . Wound, open, foot     Past Surgical History:  Procedure Laterality Date  . ABDOMINAL  HYSTERECTOMY    . BLADDER SUSPENSION    . VESICOVAGINAL FISTULA CLOSURE W/ TAH      OB History    Gravida Para Term Preterm AB Living   2 2 2     2    SAB TAB Ectopic Multiple Live Births                   Home Medications    Prior to Admission medications   Medication Sig Start Date End Date Taking? Authorizing Provider  amLODipine (NORVASC) 10 MG tablet Take 10 mg by mouth daily.   Yes [provider]  aspirin 325 MG tablet Take 1 tablet (325 mg total) by mouth daily. 05/07/17  Yes Erline Hau, MD  blood glucose meter kit and supplies KIT Dispense based on patient and insurance preference. Use up to four times daily as directed. (FOR ICD-9 250.00, 250.01). 03/24/15  Yes Samuella Cota, MD  carvedilol (COREG) 6.25 MG tablet Take 1 tablet (6.25 mg total) by mouth 2 (two) times daily with a meal. 05/06/17  Yes Isaac Bliss, Rayford Halsted, MD  doxazosin (CARDURA) 4 MG tablet Take 1 tablet (4 mg total) by mouth daily. 05/09/17  Yes Reyne Dumas, MD  fenofibrate 160 MG tablet Take 160 mg by mouth daily.   Yes [provider]  fexofenadine (  ALLEGRA) 180 MG tablet Take 180 mg by mouth daily.   Yes [provider]  gabapentin (NEURONTIN) 100 MG capsule Take 200 mg by mouth at bedtime.    Yes [provider]  hydrALAZINE (APRESOLINE) 100 MG tablet Take 1 tablet (100 mg total) by mouth every 8 (eight) hours. 05/08/17  Yes Reyne Dumas, MD  insulin glargine (LANTUS) 100 UNIT/ML injection Inject 0.12 mLs (12 Units total) into the skin daily. 05/09/17  Yes Reyne Dumas, MD  omeprazole (PRILOSEC) 20 MG capsule Take 20 mg by mouth daily.   Yes [provider]  ondansetron (ZOFRAN) 4 MG tablet Take 4 mg by mouth every 8 (eight) hours as needed for nausea or vomiting.   Yes [provider]  potassium chloride SA (K-DUR,KLOR-CON) 20 MEQ tablet Take 40 mEq by mouth daily.   Yes [provider]  sitaGLIPtin (JANUVIA) 25 MG  tablet Take 25 mg by mouth daily.   Yes [provider]  torsemide (DEMADEX) 20 MG tablet Take 60 mg by mouth daily.    Yes [provider]    Family History Family History  Problem Relation Age of Onset  . Aneurysm Mother   . Diabetes Mother   . Hypertension Mother   . Cancer Neg Hx   . Heart disease Neg Hx   . Stroke Neg Hx     Social History Social History  Substance Use Topics  . Smoking status: Former Smoker    Packs/day: 1.00    Years: 30.00  . Smokeless tobacco: Never Used  . Alcohol use No     Allergies   Glipizide; Penicillins; Clonidine derivatives; Codeine; Hydrocodone-acetaminophen; Shellfish allergy; and Sulfa antibiotics   Review of Systems Review of Systems  Unable to perform ROS: Mental status change     Physical Exam Updated Vital Signs BP (!) 141/49   Pulse 69   Resp 18   Ht 5' 6"  (1.676 m)   Wt 93.9 kg (207 lb)   SpO2 95%   BMI 33.41 kg/m   Physical Exam Physical Exam  Nursing note and vitals reviewed. Constitutional: appears ill, listless, non-toxic, and in no acute distress Head: Normocephalic and atraumatic.  Mouth/Throat: Oropharynx is clear and dry mucous membranes.  Neck: Normal range of motion. Neck supple.  Cardiovascular: Normal rate and regular rhythm.  +1 bilateral pitting edema Pulmonary/Chest: Effort normal and breath sounds with crackles at the bases.  Abdominal: Soft. There is no tenderness. There is no rebound and no guarding.  Musculoskeletal: No deformities  Neurological: Alert, oriented to person, time, place but not to situation, no facial droop, slow speech Skin: Skin is warm and dry.  Psychiatric: Cooperative   ED Treatments / Results  Labs (all labs ordered are listed, but only abnormal results are displayed) Labs Reviewed  COMPREHENSIVE METABOLIC PANEL - Abnormal; Notable for the following:       Result Value   Chloride 100 (*)    Glucose, Bld 173 (*)    BUN 44 (*)    Creatinine, Ser  2.81 (*)    Calcium 8.8 (*)    Albumin 3.4 (*)    Alkaline Phosphatase 37 (*)    GFR calc non Af Amer 17 (*)    GFR calc Af Amer 19 (*)    All other components within normal limits  CBC WITH DIFFERENTIAL/PLATELET - Abnormal; Notable for the following:    RBC 3.56 (*)    Hemoglobin 10.3 (*)    HCT 32.7 (*)  RDW 15.8 (*)    Lymphs Abs 0.5 (*)    All other components within normal limits  URINALYSIS, ROUTINE W REFLEX MICROSCOPIC - Abnormal; Notable for the following:    Color, Urine AMBER (*)    APPearance TURBID (*)    Hgb urine dipstick SMALL (*)    Protein, ur 100 (*)    Leukocytes, UA LARGE (*)    Bacteria, UA MANY (*)    Squamous Epithelial / LPF 0-5 (*)    All other components within normal limits  BRAIN NATRIURETIC PEPTIDE - Abnormal; Notable for the following:    B Natriuretic Peptide 788.0 (*)    All other components within normal limits  I-STAT TROPONIN, ED - Abnormal; Notable for the following:    Troponin i, poc 0.14 (*)    All other components within normal limits  CULTURE, BLOOD (ROUTINE X 2)  CULTURE, BLOOD (ROUTINE X 2)  URINE CULTURE  PROTIME-INR  I-STAT CG4 LACTIC ACID, ED    EKG  EKG Interpretation  Date/Time:  Thursday June 10 2017 09:14:22 EDT Ventricular Rate:  80 PR Interval:    QRS Duration: 114 QT Interval:  372 QTC Calculation: 430 R Axis:   68 Text Interpretation:  Sinus rhythm Probable left atrial enlargement Borderline intraventricular conduction delay Borderline repolarization abnormality similar to previous EKG  Confirmed by Brantley Stage [phone]) on 06/10/2017 9:35:36 AM       Radiology Dg Chest 2 View  Result Date: 06/10/2017 CLINICAL DATA:  Altered mental status, previous CVA with right hemi paresis. History of diabetes, CHF, chronic respiratory failure on home oxygen. Recent UTI. EXAM: CHEST  2 VIEW COMPARISON:  Chest x-ray of May 13, 2017 FINDINGS: The lungs are reasonably well inflated. There is increased density in the left  hemithorax obscuring the left heart border. The interstitial markings are coarse. There are small amounts of pleural fluid blunting the costophrenic angles. The cardiac silhouette is enlarged. The pulmonary vascularity is mildly engorged. IMPRESSION: Probable left lower lobe pneumonia. Superimposed mild to moderate CHF. Electronically Signed   By: David  Martinique M.D.   On: 06/10/2017 10:13   Ct Head Wo Contrast  Result Date: 06/10/2017 CLINICAL DATA:  Altered mental status. EXAM: CT HEAD WITHOUT CONTRAST TECHNIQUE: Contiguous axial images were obtained from the base of the skull through the vertex without intravenous contrast. COMPARISON:  MRI brain dated August 03, 2013. CT head dated August 03, 2013. FINDINGS: Brain: No evidence of acute infarction, hemorrhage, hydrocephalus, extra-axial collection or mass lesion/mass effect. 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 Periventricular white matter and corona radiata hypodensities favor chronic ischemic microvascular white matter disease. Vascular: Intracranial atherosclerotic vascular calcifications. No hyperdense vessel. Skull: Normal. Negative for fracture or focal lesion. Sinuses/Orbits: The bilateral paranasal sinuses and mastoid air cells are clear. The orbits are unremarkable. Other: None. IMPRESSION: 1. No acute intracranial abnormality. 2. 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 Electronically Signed   By: Titus Dubin M.D.   On: 06/10/2017 10:42    Procedures Procedures (including critical care time)  Medications Ordered in ED Medications  vancomycin (VANCOCIN) IVPB 1000 mg/200 mL premix (1,000 mg Intravenous New Bag/Given 06/10/17 1105)  ceFEPIme (MAXIPIME) 2 g in dextrose 5 % 50 mL IVPB (0 g Intravenous Stopped 06/10/17 1106)    furosemide (LASIX) injection 40 mg (  40 mg Intravenous Given 06/10/17 1031)     Initial Impression / Assessment and Plan / ED Course  I have reviewed the triage vital signs and the nursing notes.  Pertinent labs & imaging results that were available during my care of the patient were reviewed by me and considered in my medical decision making (see chart for details).     66 year old female who presents with altered mental status. Afebrile here with a rectal temperature of 99.7. Hemodynamically stable. On 4 L home oxygen with normal sats.  Disoriented to situation, listless, no focal neurological deficits.  Concern for sepsis, but also look fluid overloaded on exam.  Normal lactate and normal leukocytosis. With evidence of acute kidney injury. UA suggestive of a UTI. Chest x-ray visualized and also shows possible left lower lobe infiltrate. Covered with vancomycin and Zosyn.  Due to fluid overload and signs of CHF on chest x-ray, and did not give fluids. Her BNP is significantly elevated and she has evidence of an elevated troponin likely demand from CHF. Is given 40 mg of Lasix. EKG shows no acute ischemic changes and she has no chest pain.  I discussed with Dr. Sarajane Jews from hospital service will admit for ongoing management.  Final Clinical Impressions(s) / ED Diagnoses   Final diagnoses:  Acute on chronic diastolic (congestive) heart failure (HCC)  Acute cystitis without hematuria  Lobar pneumonia (HCC)  Encephalopathy    New Prescriptions New Prescriptions   No medications on file     Forde Dandy, MD 06/10/17 1108

## 2017-06-10 NOTE — ED Notes (Signed)
Lab in room obtaining blood cultures just prior to abx start.

## 2017-06-10 NOTE — Progress Notes (Signed)
Pharmacy Antibiotic Note  Madison Reed is a 66 y.o. female admitted on 06/10/2017 with sepsis.  Pharmacy has been consulted for vancomycin and cefepime dosing. Vancomycin 1gm and cefepime 2gm given in ED.   Plan: Continue vancomycin 1gm IV q24 hours Continue cefepime 1gm IV q24 hours F/u renal function, cultures and clinical course  Height: 5\' 6"  (167.6 cm) Weight: 211 lb 10.3 oz (96 kg) IBW/kg (Calculated) : 59.3  Temp (24hrs), Avg:98.2 F (36.8 C), Min:98.2 F (36.8 C), Max:98.2 F (36.8 C)   Recent Labs Lab 06/10/17 0914 06/10/17 0936  WBC 4.7  --   CREATININE 2.81*  --   LATICACIDVEN  --  0.61    Estimated Creatinine Clearance: 23.9 mL/min (A) (by C-G formula based on SCr of 2.81 mg/dL (H)).    Allergies  Allergen Reactions  . Glipizide     Severe hypoglycemia (22!)  . Penicillins Itching    Has patient had a PCN reaction causing immediate rash, facial/tongue/throat swelling, SOB or lightheadedness with hypotension: Yes Has patient had a PCN reaction causing severe rash involving mucus membranes or skin necrosis: No Has patient had a PCN reaction that required hospitalization: No Has patient had a PCN reaction occurring within the last 10 years: No If all of the above answers are "NO", then may proceed with Cephalosporin use.   . Clonidine Derivatives Other (See Comments)    Numbness  to tongue face, and arm  . Codeine Itching  . Hydrocodone-Acetaminophen Itching  . Shellfish Allergy Itching  . Sulfa Antibiotics Itching and Rash     Thank you for allowing pharmacy to be a part of this patient's care.  Excell Seltzer Poteet 06/10/2017 2:06 PM

## 2017-06-10 NOTE — ED Notes (Signed)
Date and time results received: 06/10/17 10:13 AM  (use smartphrase ".now" to insert current time)  Test: Troponin I stat Critical Value: 0.14  Name of Provider Notified: Liu  Orders Received? Or Actions Taken?: Orders Received - See Orders for details

## 2017-06-10 NOTE — ED Notes (Addendum)
Hospitalist at bedside. Will collect Lactic Acid once finished.

## 2017-06-11 DIAGNOSIS — E119 Type 2 diabetes mellitus without complications: Secondary | ICD-10-CM

## 2017-06-11 DIAGNOSIS — I248 Other forms of acute ischemic heart disease: Secondary | ICD-10-CM

## 2017-06-11 LAB — GLUCOSE, CAPILLARY
GLUCOSE-CAPILLARY: 175 mg/dL — AB (ref 65–99)
GLUCOSE-CAPILLARY: 226 mg/dL — AB (ref 65–99)
GLUCOSE-CAPILLARY: 222 mg/dL — AB (ref 65–99)
Glucose-Capillary: 131 mg/dL — ABNORMAL HIGH (ref 65–99)

## 2017-06-11 LAB — BASIC METABOLIC PANEL
ANION GAP: 8 (ref 5–15)
BUN: 45 mg/dL — AB (ref 6–20)
CHLORIDE: 100 mmol/L — AB (ref 101–111)
CO2: 30 mmol/L (ref 22–32)
Calcium: 8.2 mg/dL — ABNORMAL LOW (ref 8.9–10.3)
Creatinine, Ser: 2.68 mg/dL — ABNORMAL HIGH (ref 0.44–1.00)
GFR, EST AFRICAN AMERICAN: 21 mL/min — AB (ref >60)
GFR, EST NON AFRICAN AMERICAN: 18 mL/min — AB (ref >60)
Glucose, Bld: 205 mg/dL — ABNORMAL HIGH (ref 65–99)
POTASSIUM: 3.3 mmol/L — AB (ref 3.5–5.1)
SODIUM: 138 mmol/L (ref 135–145)

## 2017-06-11 LAB — CBC
HEMATOCRIT: 29.2 % — AB (ref 36.0–46.0)
HEMOGLOBIN: 9.1 g/dL — AB (ref 12.0–15.0)
MCH: 28.7 pg (ref 26.0–34.0)
MCHC: 31.2 g/dL (ref 30.0–36.0)
MCV: 92.1 fL (ref 78.0–100.0)
Platelets: 139 10*3/uL — ABNORMAL LOW (ref 150–400)
RBC: 3.17 MIL/uL — AB (ref 3.87–5.11)
RDW: 15.8 % — AB (ref 11.5–15.5)
WBC: 3.7 10*3/uL — AB (ref 4.0–10.5)

## 2017-06-11 LAB — TROPONIN I: TROPONIN I: 0.34 ng/mL — AB (ref <0.03)

## 2017-06-11 MED ORDER — POTASSIUM CHLORIDE CRYS ER 20 MEQ PO TBCR
40.0000 meq | EXTENDED_RELEASE_TABLET | Freq: Every day | ORAL | Status: DC
  Administered 2017-06-12 – 2017-06-15 (×4): 40 meq via ORAL
  Filled 2017-06-11 (×6): qty 2

## 2017-06-11 MED ORDER — POTASSIUM CHLORIDE CRYS ER 20 MEQ PO TBCR
40.0000 meq | EXTENDED_RELEASE_TABLET | Freq: Once | ORAL | Status: AC
  Administered 2017-06-11: 40 meq via ORAL
  Filled 2017-06-11: qty 2

## 2017-06-11 NOTE — Progress Notes (Signed)
PROGRESS NOTE  Madison Reed UYQ:[id number] DOB: 04-29-1951 DOA: 06/10/2017 PCP: Celene Squibb, MD  Brief Narrative: 66 year old woman PMH diabetes mellitus type 2, stroke, debility, recently discharged from skilled nursing facility where she was placed for short-term rehabilitation following hospitalization for acute on chronic diastolic CHF, hypoglycemia, acute kidney injury who presented to the emergency department with reported fever and confusion. Admitted for lobar pneumonia, UTI, acute on chronic diastolic congestive heart failure.  Assessment/Plan UTI without sepsis. -Clinically improving. Continue empiric antibiotics. Follow-up culture datat  Lobar pneumonia, left lower lobe. -Improving. Continue empiric antibiotics. Continue supplemental oxygen. Steroids.  Acute on chronic diastolic congestive heart failure. -350 since admission. Still has significant volume overload. -Continue IV diuresis. Renal function improved.  Demand ischemia, secondary to acute illness, CHF. -Troponin trending down. Asymptomatic. No further evaluation suggested.  Acute kidney injury superimposed on chronic kidney disease stage III. -Improving with diuresis. Suspect relative hypoperfusion of the kidneys in the setting of diastolic heart failure. -Daily BMP.  Normocytic anemia. Etiology unclear. No evidence of bleeding. -Follow clinically. Check CBC in a.m. Check anemia panel.  Chronic hypoxic respiratory failure on 2-4 liters nasal cannula at home. -Stable. Continue supplemental oxygen.  Diabetes mellitus type 2 -Remains stable. Continue Lantus, sliding scale insulin.  Debility, near bedbound status. -Follow-up physical therapy evaluation, social work consultation  Chronic delirium. -Appears stable.    Overall improving. Continue current treatments. Transfer to medical floor.  DVT prophylaxis: enoxaparin Code Status: full Family Communication: none Disposition Plan: pending    Murray Hodgkins, MD  [facility] Direct contact: [phone] --Via amion app OR  --[email or web address]; password [email or web address]  7PM-7AM contact night coverage as above 06/11/2017, 10:56 AM  LOS: 1 day   Consultants:    Procedures:    Antimicrobials:  Cefepime 8/30 >>  Vancomycin 8/30 >>  Interval history/Subjective: Feels fine. No complaints. No shortness of breath. No pain. No nausea or vomiting.  Objective: Vitals: Afebrile, 97.9, 69, 20, 171/58, 95%  Exam:     Constitutional: Appears much better today. Appears calm, comfortable.  Cardiovascular. Regular rate and rhythm. 2/6 systolic murmur. No rub or gallop. No change in 3+ bilateral lower extremity edema.  Respiratory. Clear to auscultation bilaterally. No wheezes, rales or rhonchi. Normal respiratory effort.  Abdomen. Soft, nontender, nondistended.  Skin. No rash or induration noted.  Psychiatric. Grossly normal mood and affect.   I have personally reviewed the following:   Labs:  Capillary blood sugar stable  Creatinine modestly improved, 2.68. Potassium 3.3. Remainder basic metabolic panel unremarkable.  Troponin peaked at 0.52. Trending down. Most recent, 0.34.  Hemoglobin and platelets slightly lower, 9.1 and 139 respectively.  Scheduled Meds: . aspirin  325 mg Oral Daily  . carvedilol  6.25 mg Oral BID WC  . doxazosin  4 mg Oral Daily  . enoxaparin (LOVENOX) injection  30 mg Subcutaneous Q24H  . furosemide  40 mg Intravenous Q12H  . gabapentin  200 mg Oral QHS  . insulin aspart  0-9 Units Subcutaneous TID WC  . insulin glargine  12 Units Subcutaneous QHS  . pantoprazole  40 mg Oral Daily  . predniSONE  40 mg Oral Q breakfast  . sodium chloride flush  3 mL Intravenous Q12H  . sodium chloride flush  3 mL Intravenous Q12H   Continuous Infusions: . sodium chloride 250 mL (06/11/17 0800)  . ceFEPime (MAXIPIME) IV Stopped (06/11/17 1016)  . vancomycin Stopped (06/10/17 1621)    Active Problems:    Difficulty walking  AKI (acute kidney injury) ([place])   Acute diastolic heart failure (HCC)   Lobar pneumonia (HCC)   Pressure injury of skin   Elevated troponin   DM type 2 (diabetes mellitus, type 2) (HCC)   Chronic respiratory failure with hypoxia ([place])   LOS: 1 day

## 2017-06-11 NOTE — Clinical Social Work Note (Signed)
Patient was referred to CSW for SNF. She was just release from SNF on this past Saturday after being at [facility] for 21 days for rehab.  Patient stated that she was advised that PT had done all they could do for her.  She stated that she did not desire to go back to SNF. Patient's son, Corene Cornea, indicated that if he thought patient would benefit from SNF he would be interested in her going but he does not think she will benefit. Patient uses a wheelchair and her ADLs are assisted by her daughter. She lives with her husband. Patient and her family want to resume their current [facility] with [facility].   LCSW signing off.      Toshiko Kemler, Clydene Pugh, LCSW

## 2017-06-11 NOTE — Evaluation (Signed)
Physical Therapy Evaluation Patient Details Name: Madison Reed MRN: [id number] DOB: [birth year] Today's Date: 06/11/2017   History of Present Illness  66 year old woman PMH diabetes mellitus type 2, stroke, debility, recently discharged from skilled nursing facility where she was placed for short-term rehabilitation following hospitalization for acute on chronic diastolic CHF, hypoglycemia, acute kidney injury who presented to the emergency department with reported fever and confusion. Admitted for lobar pneumonia, UTI, acute on chronic diastolic congestive heart failure.    Clinical Impression  Patient Max assist sitting up at bedside, limited to a few side steps due to BLE weakness, fatigue and poor standing balance.  Patient will benefit from continued physical therapy in hospital and recommended venue below to increase strength, balance, endurance for safe ADLs and gait.    Follow Up Recommendations SNF    Equipment Recommendations       Recommendations for Other Services       Precautions / Restrictions Precautions Precautions: Fall Restrictions Weight Bearing Restrictions: No      Mobility  Bed Mobility Overal bed mobility: Needs Assistance Bed Mobility: Supine to Sit;Sit to Supine     Supine to sit: Max assist Sit to supine: Max assist      Transfers Overall transfer level: Needs assistance Equipment used: Rolling walker (2 wheeled) Transfers: Sit to/from Stand Sit to Stand: Max assist            Ambulation/Gait Ambulation/Gait assistance: Max assist Ambulation Distance (Feet): 3 Feet Assistive device: Rolling walker (2 wheeled) Gait Pattern/deviations: Decreased step length - right;Decreased step length - left;Decreased stance time - right;Decreased stance time - left;Decreased stride length   Gait velocity interpretation: Below normal speed for age/gender General Gait Details: limited to side steps at bedside due to BLE weakness  Stairs             Wheelchair Mobility    Modified Rankin (Stroke Patients Only)       Balance Overall balance assessment: Needs assistance Sitting-balance support: Bilateral upper extremity supported;Feet unsupported Sitting balance-Leahy Scale: Poor   Postural control: Right lateral lean Standing balance support: Bilateral upper extremity supported;During functional activity Standing balance-Leahy Scale: Poor                               Pertinent Vitals/Pain Pain Assessment: No/denies pain    Home Living Family/patient expects to be discharged to:: Private residence Living Arrangements: Spouse/significant other Available Help at Discharge: Family Type of Home: House Home Access: Stairs to enter Entrance Stairs-Rails: Can reach both Entrance Stairs-Number of Steps: 2 Home Layout: One level Home Equipment: Bedside commode;Shower seat;Walker - 2 wheels;Wheelchair - manual      Prior Function Level of Independence: Needs assistance   Gait / Transfers Assistance Needed: Assisted with FWW short household distances  ADL's / Homemaking Assistance Needed: Assisted by family        Hand Dominance   Dominant Hand: Right    Extremity/Trunk Assessment   Upper Extremity Assessment Upper Extremity Assessment: Generalized weakness    Lower Extremity Assessment Lower Extremity Assessment: Generalized weakness    Cervical / Trunk Assessment Cervical / Trunk Assessment: Normal  Communication   Communication: No difficulties  Cognition Arousal/Alertness: Awake/alert Behavior During Therapy: WFL for tasks assessed/performed Overall Cognitive Status: Within Functional Limits for tasks assessed  General Comments      Exercises General Exercises - Lower Extremity Ankle Circles/Pumps: AROM;Both;Supine;10 reps Long Arc Quad: Seated;AROM;Both;5 reps Hip ABduction/ADduction: Seated;AROM;Both;5 reps   Assessment/Plan     PT Assessment Patient needs continued PT services  PT Problem List Decreased strength;Decreased activity tolerance;Decreased balance;Decreased mobility       PT Treatment Interventions Gait training;Stair training;Functional mobility training;Therapeutic activities;Therapeutic exercise;Patient/family education    PT Goals (Current goals can be found in the Care Plan section)  Acute Rehab PT Goals Patient Stated Goal: Return home PT Goal Formulation: With patient/family Time For Goal Achievement: 06/25/17 Potential to Achieve Goals: Fair    Frequency Min 3X/week   Barriers to discharge        Co-evaluation               AM-PAC PT "6 Clicks" Daily Activity  Outcome Measure Difficulty turning over in bed (including adjusting bedclothes, sheets and blankets)?: Unable Difficulty moving from lying on back to sitting on the side of the bed? : Unable Difficulty sitting down on and standing up from a chair with arms (e.g., wheelchair, bedside commode, etc,.)?: Unable Help needed moving to and from a bed to chair (including a wheelchair)?: A Lot Help needed walking in hospital room?: A Lot Help needed climbing 3-5 steps with a railing? : Total 6 Click Score: 8    End of Session Equipment Utilized During Treatment: Gait belt;Oxygen Activity Tolerance: Patient limited by fatigue Patient left: in bed;with call bell/phone within reach;with family/visitor present   PT Visit Diagnosis: Unsteadiness on feet (R26.81);Other abnormalities of gait and mobility (R26.89);Muscle weakness (generalized) (M62.81);History of falling (Z91.81)    Time: 8786-7672 PT Time Calculation (min) (ACUTE ONLY): 35 min   Charges:   PT Evaluation $PT Eval Moderate Complexity: 1 Mod PT Treatments $Therapeutic Activity: 23-37 mins   PT G Codes:        2:23 PM, 07/04/2017 Lonell Grandchild, MPT Physical Therapist with [facility] 336 [phone] office [phone] mobile phone

## 2017-06-11 NOTE — Care Management Important Message (Signed)
Important Message  Patient Details  Name: Madison Reed MRN: [id number] Date of Birth: 1951/02/16   Medicare Important Message Given:  Yes    Sherald Barge, RN 06/11/2017, 1:50 PM

## 2017-06-11 NOTE — Care Management Note (Signed)
Case Management Note  Patient Details  Name: MERILYNN HAYDU MRN: [id number] Date of Birth: 08-Mar-1951  Subjective/Objective:                  Admitted with sepsis. Pt from home, active with [facility] pta. She has home oxygen, neb machine, RW and WC pta. PT has recommended SNF and pt declines, wants to go home with resumption of [place] services. Vaughan Basta, [facility] rep, aware of admission and DC plan. Pt aware HH has 48 hrs to resume services after DC.   Action/Plan: Potential DC home over weekend. Pt will need order to resume home health services.   Expected Discharge Date:  06/14/17               Expected Discharge Plan:  [place]  In-House Referral:  Clinical Social Work  Discharge planning Services  CM Consult  Post Acute Care Choice:  Home Health, Resumption of Svcs/PTA Provider Choice offered to:    [facility]  HH Arranged:  RN, PT [employer] Agency:  [place]  Status of Service:  In process, will continue to follow  Sherald Barge, RN 06/11/2017, 3:36 PM

## 2017-06-11 NOTE — Progress Notes (Signed)
PT TRANSFERRING TO ROOM 316 AS MED/SURG PT. PT ON O2 AT 4L/MIN VIA [place] O2N SAT 97%.LT NSL PATENT. EXTERNAL FEMALE CATHETER. IN PLACE AND DRAINING YELLOW URINE.DAUGHTER AND SON AT BEDSIDE. CONTINUES TO HAVE 3+ EDEMA OF LOWER EXTREMITIES. TRANSFER REPORT CALLED TO BRITIANY RN ON 300.

## 2017-06-11 NOTE — Care Management (Signed)
Admitted with pneumonia. Pt from home with [facility] services through [facility]. She was recently DC'd from SNF. PT eval pending to determine safest plan for DC.

## 2017-06-12 LAB — CBC
HCT: 28.2 % — ABNORMAL LOW (ref 36.0–46.0)
Hemoglobin: 9 g/dL — ABNORMAL LOW (ref 12.0–15.0)
MCH: 29.3 pg (ref 26.0–34.0)
MCHC: 31.9 g/dL (ref 30.0–36.0)
MCV: 91.9 fL (ref 78.0–100.0)
PLATELETS: 158 10*3/uL (ref 150–400)
RBC: 3.07 MIL/uL — ABNORMAL LOW (ref 3.87–5.11)
RDW: 15.5 % (ref 11.5–15.5)
WBC: 5.2 10*3/uL (ref 4.0–10.5)

## 2017-06-12 LAB — BASIC METABOLIC PANEL
Anion gap: 9 (ref 5–15)
BUN: 49 mg/dL — AB (ref 6–20)
CO2: 33 mmol/L — ABNORMAL HIGH (ref 22–32)
CREATININE: 2.19 mg/dL — AB (ref 0.44–1.00)
Calcium: 8.6 mg/dL — ABNORMAL LOW (ref 8.9–10.3)
Chloride: 98 mmol/L — ABNORMAL LOW (ref 101–111)
GFR calc Af Amer: 26 mL/min — ABNORMAL LOW (ref >60)
GFR, EST NON AFRICAN AMERICAN: 23 mL/min — AB (ref >60)
GLUCOSE: 112 mg/dL — AB (ref 65–99)
POTASSIUM: 3.2 mmol/L — AB (ref 3.5–5.1)
SODIUM: 140 mmol/L (ref 135–145)

## 2017-06-12 LAB — GLUCOSE, CAPILLARY
GLUCOSE-CAPILLARY: 103 mg/dL — AB (ref 65–99)
GLUCOSE-CAPILLARY: 215 mg/dL — AB (ref 65–99)
Glucose-Capillary: 144 mg/dL — ABNORMAL HIGH (ref 65–99)
Glucose-Capillary: 231 mg/dL — ABNORMAL HIGH (ref 65–99)

## 2017-06-12 LAB — FOLATE: Folate: 6.9 ng/mL (ref >5.9)

## 2017-06-12 LAB — IRON AND TIBC
IRON: 29 ug/dL (ref 28–170)
SATURATION RATIOS: 8 % — AB (ref 10.4–31.8)
TIBC: 344 ug/dL (ref 250–450)
UIBC: 315 ug/dL

## 2017-06-12 LAB — VITAMIN B12: Vitamin B-12: 202 pg/mL (ref 180–914)

## 2017-06-12 LAB — FERRITIN: FERRITIN: 61 ng/mL (ref 11–307)

## 2017-06-12 MED ORDER — NYSTATIN 100000 UNIT/GM EX POWD
Freq: Three times a day (TID) | CUTANEOUS | Status: DC
  Administered 2017-06-12 – 2017-06-17 (×16): via TOPICAL
  Filled 2017-06-12: qty 15

## 2017-06-12 NOTE — Progress Notes (Addendum)
PROGRESS NOTE  Madison Reed [id number]:[id number] DOB: 06/21/1951 DOA: 06/10/2017 PCP: Celene Squibb, MD  Brief Narrative: 66 year old woman PMH diabetes mellitus type 2, stroke, debility, recently discharged from skilled nursing facility where she was placed for short-term rehabilitation following hospitalization for acute on chronic diastolic CHF, hypoglycemia, acute kidney injury who presented to the emergency department with reported fever and confusion. Admitted for lobar pneumonia, UTI, acute on chronic diastolic congestive heart failure.  Assessment/Plan UTI without sepsis. -Continues to improve. Culture with greater than 100,000 gram-negative rods. Continue current antibiotics, follow-up culture data. -Discontinue vancomycin  Lobar pneumonia, left lower lobe. -Appears much improved. Chronic oxygen requirement stable. Continue current antibiotic.  Acute on chronic diastolic congestive heart failure. - excellent diuresis. Renal function improving. Still has significant volume overload. Continue IV Lasix at current dose.  Acute kidney injury superimposed on chronic kidney disease stage III. -Continues to improve with diuresis.  Normocytic anemia. Etiology unclear. No evidence of bleeding. -Remains stable. Follow-up anemia panel.  Chronic hypoxic respiratory failure on 2-4 liters nasal cannula at home. -Remains stable  Diabetes mellitus type 2 -Stable. Continue Lantus, sliding scale insulin.  Debility, near bedbound status. -Appreciate physical therapy, social work. Skilled nursing facility was recommended but family prefers to take patient home.  Chronic delirium. -Stable.  Demand ischemia, secondary to acute illness, CHF. -No further evaluation suggested.  Stage II pressure injury to sacrum, present on admission    Overall improving, still has significant volume overload, continue IV diuresis. Likely home in 48 hours.  DVT prophylaxis: enoxaparin Code Status: full Family  Communication: none Disposition Plan: Home with home health RN, PT   Murray Hodgkins, MD  [facility] Direct contact: [phone] --Via [place]  --[email or web address]; password [email or web address]  7PM-7AM contact night coverage as above 06/12/2017, 11:31 AM  LOS: 2 days   Consultants:    Procedures:    Antimicrobials:  Cefepime 8/30 >>  Vancomycin 8/30 >> 9/1  Interval history/Subjective: No issues overnight. Feels well. No complaints.  Objective: Vitals: 97.7, afebrile, 20, 76, 156/45  Exam:     Constitutional. Appears calm, comfortable.  Respiratory. Clear to auscultation bilaterally. No wheezes, rales or rhonchi. Normal respiratory effort.  Cardiovascular. Regular rate and rhythm. No murmur, rub or gallop. 2+ bilateral lower extremity edema.   I have personally reviewed the following:  Urine output 2600. -2.1 L since admission. Labs:  Creatinine improving, 2.19. Potassium 3.2.  Hemoglobin stable 9.0. WBC 5.2. Platelets 158.  Scheduled Meds: . aspirin  325 mg Oral Daily  . carvedilol  6.25 mg Oral BID WC  . doxazosin  4 mg Oral Daily  . enoxaparin (LOVENOX) injection  30 mg Subcutaneous Q24H  . furosemide  40 mg Intravenous Q12H  . gabapentin  200 mg Oral QHS  . insulin aspart  0-9 Units Subcutaneous TID WC  . insulin glargine  12 Units Subcutaneous QHS  . nystatin   Topical TID  . pantoprazole  40 mg Oral Daily  . potassium chloride  40 mEq Oral Daily  . predniSONE  40 mg Oral Q breakfast  . sodium chloride flush  3 mL Intravenous Q12H  . sodium chloride flush  3 mL Intravenous Q12H   Continuous Infusions: . sodium chloride 250 mL (06/11/17 1200)  . ceFEPime (MAXIPIME) IV 1 g (06/12/17 0955)  . vancomycin Stopped (06/11/17 1624)    Active Problems:   Difficulty walking   AKI (acute kidney injury) ([place])   Acute diastolic heart failure (HCC)   Lobar pneumonia (  [place])   Pressure injury of skin   Elevated troponin   DM type 2 (diabetes mellitus, type  2) (HCC)   Chronic respiratory failure with hypoxia ([place])   LOS: 2 days

## 2017-06-13 DIAGNOSIS — L89153 Pressure ulcer of sacral region, stage 3: Secondary | ICD-10-CM

## 2017-06-13 DIAGNOSIS — I10 Essential (primary) hypertension: Secondary | ICD-10-CM

## 2017-06-13 DIAGNOSIS — I248 Other forms of acute ischemic heart disease: Secondary | ICD-10-CM

## 2017-06-13 LAB — BASIC METABOLIC PANEL
ANION GAP: 6 (ref 5–15)
BUN: 43 mg/dL — ABNORMAL HIGH (ref 6–20)
CO2: 37 mmol/L — AB (ref 22–32)
Calcium: 8.9 mg/dL (ref 8.9–10.3)
Chloride: 96 mmol/L — ABNORMAL LOW (ref 101–111)
Creatinine, Ser: 1.8 mg/dL — ABNORMAL HIGH (ref 0.44–1.00)
GFR, EST AFRICAN AMERICAN: 33 mL/min — AB (ref >60)
GFR, EST NON AFRICAN AMERICAN: 29 mL/min — AB (ref >60)
Glucose, Bld: 104 mg/dL — ABNORMAL HIGH (ref 65–99)
POTASSIUM: 3.2 mmol/L — AB (ref 3.5–5.1)
Sodium: 139 mmol/L (ref 135–145)

## 2017-06-13 LAB — GLUCOSE, CAPILLARY
GLUCOSE-CAPILLARY: 147 mg/dL — AB (ref 65–99)
GLUCOSE-CAPILLARY: 216 mg/dL — AB (ref 65–99)
Glucose-Capillary: 105 mg/dL — ABNORMAL HIGH (ref 65–99)
Glucose-Capillary: 250 mg/dL — ABNORMAL HIGH (ref 65–99)

## 2017-06-13 MED ORDER — ENOXAPARIN SODIUM 40 MG/0.4ML ~~LOC~~ SOLN
40.0000 mg | SUBCUTANEOUS | Status: DC
  Administered 2017-06-13 – 2017-06-16 (×4): 40 mg via SUBCUTANEOUS
  Filled 2017-06-13 (×4): qty 0.4

## 2017-06-13 MED ORDER — CEFUROXIME AXETIL 250 MG PO TABS
500.0000 mg | ORAL_TABLET | Freq: Two times a day (BID) | ORAL | Status: AC
  Administered 2017-06-13 – 2017-06-15 (×5): 500 mg via ORAL
  Filled 2017-06-13 (×7): qty 2

## 2017-06-13 MED ORDER — VITAMIN B-12 100 MCG PO TABS
250.0000 ug | ORAL_TABLET | Freq: Every day | ORAL | Status: DC
  Administered 2017-06-13 – 2017-06-17 (×4): 250 ug via ORAL
  Filled 2017-06-13 (×4): qty 3

## 2017-06-13 MED ORDER — FOLIC ACID 1 MG PO TABS
1.0000 mg | ORAL_TABLET | Freq: Every day | ORAL | Status: DC
  Administered 2017-06-13 – 2017-06-17 (×4): 1 mg via ORAL
  Filled 2017-06-13 (×5): qty 1

## 2017-06-13 MED ORDER — AMLODIPINE BESYLATE 5 MG PO TABS
10.0000 mg | ORAL_TABLET | Freq: Every day | ORAL | Status: DC
  Administered 2017-06-13 – 2017-06-17 (×4): 10 mg via ORAL
  Filled 2017-06-13 (×5): qty 2

## 2017-06-13 MED ORDER — HYDRALAZINE HCL 20 MG/ML IJ SOLN
10.0000 mg | INTRAMUSCULAR | Status: DC | PRN
  Administered 2017-06-13 – 2017-06-14 (×2): 10 mg via INTRAVENOUS
  Filled 2017-06-13 (×2): qty 1

## 2017-06-13 MED ORDER — HYDRALAZINE HCL 25 MG PO TABS
100.0000 mg | ORAL_TABLET | Freq: Three times a day (TID) | ORAL | Status: DC
  Administered 2017-06-13 – 2017-06-17 (×14): 100 mg via ORAL
  Filled 2017-06-13 (×15): qty 4

## 2017-06-13 NOTE — Progress Notes (Signed)
Physical Therapy Treatment Patient Details Name: Madison Reed MRN: [id number] DOB: [birth year] Today's Date: 06/13/2017    History of Present Illness 66 year old woman PMH diabetes mellitus type 2, stroke, debility, recently discharged from skilled nursing facility where she was placed for short-term rehabilitation following hospitalization for acute on chronic diastolic CHF, hypoglycemia, acute kidney injury who presented to the emergency department with reported fever and confusion. Admitted for lobar pneumonia, UTI, acute on chronic diastolic congestive heart failure.    PT Comments    Pt was seen for short visit to walk and practice mult transfers as well as bed mob with cues.  Her body mechanics are not always the best, and assisted her to find safer movement and follow through with efforts to stand.  Fatigued quickly and so had to help her with standing more hands on at the end.   Follow Up Recommendations  SNF     Equipment Recommendations  None recommended by PT    Recommendations for Other Services       Precautions / Restrictions Precautions Precautions: Fall Restrictions Weight Bearing Restrictions: No    Mobility  Bed Mobility Overal bed mobility: Needs Assistance Bed Mobility: Supine to Sit;Sit to Supine     Supine to sit: Mod assist Sit to supine: Mod assist (cues to avoid listing back and to come to side instead)      Transfers Overall transfer level: Needs assistance Equipment used: Rolling walker (2 wheeled);1 person hand held assist Transfers: Sit to/from Stand Sit to Stand: Max assist;From elevated surface         General transfer comment: used RW and then assisted directly  Ambulation/Gait Ambulation/Gait assistance: Max assist Ambulation Distance (Feet): 3 Feet Assistive device: Rolling walker (2 wheeled) Gait Pattern/deviations: Decreased step length - right;Decreased step length - left;Decreased stance time - right;Decreased stance time -  left;Decreased stride length Gait velocity: reduced Gait velocity interpretation: Below normal speed for age/gender General Gait Details: limited to side steps at bedside due to BLE weakness   Stairs            Wheelchair Mobility    Modified Rankin (Stroke Patients Only)       Balance   Sitting-balance support: Bilateral upper extremity supported;Feet unsupported Sitting balance-Leahy Scale: Poor     Standing balance support: Bilateral upper extremity supported;During functional activity Standing balance-Leahy Scale: Poor                              Cognition Arousal/Alertness: Awake/alert Behavior During Therapy: WFL for tasks assessed/performed Overall Cognitive Status: Within Functional Limits for tasks assessed                                        Exercises      General Comments        Pertinent Vitals/Pain Pain Assessment: No/denies pain    Home Living                      Prior Function            PT Goals (current goals can now be found in the care plan section) Acute Rehab PT Goals Patient Stated Goal: Return home Progress towards PT goals: Progressing toward goals    Frequency    Min 3X/week      PT Plan Current plan  remains appropriate    Co-evaluation              AM-PAC PT "6 Clicks" Daily Activity  Outcome Measure  Difficulty turning over in bed (including adjusting bedclothes, sheets and blankets)?: Unable Difficulty moving from lying on back to sitting on the side of the bed? : Unable Difficulty sitting down on and standing up from a chair with arms (e.g., wheelchair, bedside commode, etc,.)?: Unable Help needed moving to and from a bed to chair (including a wheelchair)?: A Lot Help needed walking in hospital room?: A Lot Help needed climbing 3-5 steps with a railing? : Total 6 Click Score: 8    End of Session Equipment Utilized During Treatment: Gait belt;Oxygen Activity  Tolerance: Patient limited by fatigue Patient left: in bed;with call bell/phone within reach;with family/visitor present Nurse Communication: Mobility status PT Visit Diagnosis: Unsteadiness on feet (R26.81);Other abnormalities of gait and mobility (R26.89);Muscle weakness (generalized) (M62.81);History of falling (Z91.81)     Time: 5852-7782 PT Time Calculation (min) (ACUTE ONLY): 19 min  Charges:  $Gait Training: 8-22 mins                    G Codes:  Functional Assessment Tool Used: AM-PAC 6 Clicks Basic Mobility    Ramond Dial 06/13/2017, 7:54 PM   7:55 PM, 06/13/17 Mee Hives, PT, MS Physical Therapist - [place] [phone] (Office)

## 2017-06-13 NOTE — Progress Notes (Signed)
PROGRESS NOTE  Madison Reed [id number] DOB: 1951/05/26 DOA: 06/10/2017 PCP: Celene Squibb, MD  Brief Narrative: 66 year old woman PMH diabetes mellitus type 2, stroke, debility, recently discharged from skilled nursing facility where she was placed for short-term rehabilitation following hospitalization for acute on chronic diastolic CHF, hypoglycemia, acute kidney injury who presented to the emergency department with reported fever and confusion. Admitted for lobar pneumonia, UTI, acute on chronic diastolic congestive heart failure.  Assessment/Plan E coli UTI without sepsis. -Continues to improve. Will change to Ceftin based on culture data.   Lobar pneumonia, left lower lobe. -Appears resolved. Chronic oxygen requirement stable. Change to oral antibiotic as above. -Finish prednisone tomorrow.  Acute on chronic diastolic congestive heart failure. -Excellent diuresis continues in renal function continues to improve. Less volume overload on exam. -Continue IV diuresis as patient still has significant volume overload. Check BMP in a.m.  Acute kidney injury superimposed on chronic kidney disease stage III. -Improving daily with IV diuresis.  Essential hypertension. -Poor control. Resume amlodipine, hydralazine. Continue carvedilol, doxazosin.  Normocytic anemia. Etiology unclear. No evidence of bleeding. -B12 and folate borderline. Ferritin within normal limits but somewhat low. -Start oral B12 and folate, follow-up as an outpatient.  Chronic hypoxic respiratory failure on 2-4 liters nasal cannula at home. -Stable.  Diabetes mellitus type 2 -Remains stable. Continue Lantus and sliding scale insulin.  Debility, near bedbound status. -Appreciate physical therapy, social work. Skilled nursing facility was recommended but family prefers to take patient home.  Chronic delirium. -Remains stable.  Demand ischemia, secondary to acute illness, CHF. -No further evaluation  suggested.  Stage II pressure injury to sacrum, present on admission -Wound care.    Continues to improve. Change to oral antibiotics. Main focus now is IV diuresis. Anticipate discharge in 48 hours.  DVT prophylaxis: enoxaparin Code Status: full Family Communication: none Disposition Plan: Home with home health RN, PT   Murray Hodgkins, MD  [facility] Direct contact: [phone] --Via [place]  --[email or web address]; password [email or web address]  7PM-7AM contact night coverage as above 06/13/2017, 12:35 PM  LOS: 3 days   Consultants:    Procedures:    Antimicrobials:  Cefepime 8/30 >>  Vancomycin 8/30 >> 9/1  Interval history/Subjective: No complaints. Breathing fine. Eating fine.  Objective: Vitals: Afebrile. 97.8, 18, 64, 208/58  Exam:     Constitutional. Appears calm and comfortable.  Cardiovascular. Regular rate and rhythm. No murmur, rub or gallop. 2+ bilateral lower extremity edema, decreasing.  Respiratory. Clear to auscultation bilaterally. No wheezes, rales or rhonchi. Normal respiratory effort.  Psychiatric. Alert. Speech fluent and appropriate.   I have personally reviewed the following:  Urine output -3300. -4.7 L since admission. Labs:  Creatinine continues to improve. 1.80. [place] 3.2.  Scheduled Meds: . amLODipine  10 mg Oral Daily  . aspirin  325 mg Oral Daily  . carvedilol  6.25 mg Oral BID WC  . cefUROXime  500 mg Oral BID WC  . doxazosin  4 mg Oral Daily  . enoxaparin (LOVENOX) injection  40 mg Subcutaneous Q24H  . folic acid  1 mg Oral Daily  . furosemide  40 mg Intravenous Q12H  . gabapentin  200 mg Oral QHS  . hydrALAZINE  100 mg Oral Q8H  . insulin aspart  0-9 Units Subcutaneous TID WC  . insulin glargine  12 Units Subcutaneous QHS  . nystatin   Topical TID  . pantoprazole  40 mg Oral Daily  . potassium chloride  40 mEq Oral Daily  .  predniSONE  40 mg Oral Q breakfast  . sodium chloride flush  3 mL Intravenous Q12H  .  sodium chloride flush  3 mL Intravenous Q12H  . vitamin B-12  250 mcg Oral Daily   Continuous Infusions: . sodium chloride 250 mL (06/11/17 1200)    Active Problems:   Difficulty walking   AKI (acute kidney injury) ([place])   Acute diastolic heart failure (HCC)   Lobar pneumonia (HCC)   Pressure injury of skin   Elevated troponin   DM type 2 (diabetes mellitus, type 2) (HCC)   Chronic respiratory failure with hypoxia (HCC)   Demand ischemia (HCC)   Stage III pressure ulcer of sacral region [facility])   Essential hypertension   LOS: 3 days

## 2017-06-14 DIAGNOSIS — L89152 Pressure ulcer of sacral region, stage 2: Secondary | ICD-10-CM

## 2017-06-14 LAB — BASIC METABOLIC PANEL
Anion gap: 6 (ref 5–15)
BUN: 47 mg/dL — AB (ref 6–20)
CALCIUM: 9 mg/dL (ref 8.9–10.3)
CO2: 40 mmol/L — AB (ref 22–32)
Chloride: 93 mmol/L — ABNORMAL LOW (ref 101–111)
Creatinine, Ser: 1.82 mg/dL — ABNORMAL HIGH (ref 0.44–1.00)
GFR calc Af Amer: 33 mL/min — ABNORMAL LOW (ref >60)
GFR calc non Af Amer: 28 mL/min — ABNORMAL LOW (ref >60)
GLUCOSE: 97 mg/dL (ref 65–99)
Potassium: 3.4 mmol/L — ABNORMAL LOW (ref 3.5–5.1)
Sodium: 139 mmol/L (ref 135–145)

## 2017-06-14 LAB — GLUCOSE, CAPILLARY
Glucose-Capillary: 94 mg/dL (ref 65–99)
Glucose-Capillary: 153 mg/dL — ABNORMAL HIGH (ref 65–99)
Glucose-Capillary: 198 mg/dL — ABNORMAL HIGH (ref 65–99)
Glucose-Capillary: 242 mg/dL — ABNORMAL HIGH (ref 65–99)

## 2017-06-14 NOTE — Progress Notes (Signed)
Physical Therapy Treatment Patient Details Name: Madison Reed MRN: [id number] DOB: 1951-06-24 Today's Date: 06/14/2017    History of Present Illness (P) 66 year old woman PMH diabetes mellitus type 2, stroke, debility, recently discharged from skilled nursing facility where she was placed for short-term rehabilitation following hospitalization for acute on chronic diastolic CHF, hypoglycemia, acute kidney injury who presented to the emergency department with reported fever and confusion. Admitted for lobar pneumonia, UTI, acute on chronic diastolic congestive heart failure.    PT Comments    Pt supine and eager to work with therapy.  Completed therex supine, needing cues and assistance with straight leg raises.  Pt able to transfer with cues, increased time and UE assist supine to sit and transfer with min assist from bedside.  Pt ambulated to restroom for BM (nursing notified).  Pt required additional assistance to stand from toilet and ambulated additional 40 feet in room following. Pt returned supine per request with HOB elevated.  Fall precautions in place.    Follow Up Recommendations              Precautions / Restrictions Precautions Precautions: Fall Restrictions Weight Bearing Restrictions: No    Mobility  Bed Mobility Overal bed mobility: Needs Assistance Bed Mobility: Supine to Sit;Sit to Supine     Supine to sit: Min assist Sit to supine: Min assist   General bed mobility comments: required for safety, hand placement and cues  Transfers Overall transfer level: Needs assistance Equipment used: Rolling walker (2 wheeled) Transfers: Sit to/from Stand Sit to Stand: Min assist;Mod assist         General transfer comment: min from elevated surfaces, mod assist from toilet  Ambulation/Gait Ambulation/Gait assistance: Min assist Ambulation Distance (Feet): 45 Feet Assistive device: Rolling walker (2 wheeled) Gait Pattern/deviations: Decreased step length -  right;Decreased step length - left;Decreased stance time - right;Decreased stance time - left;Decreased stride length   Gait velocity interpretation: <1.8 ft/sec, indicative of risk for recurrent falls General Gait Details: limited to in room ambulation due to weakness and oxygen                                                   Cognition Arousal/Alertness: Awake/alert Behavior During Therapy: WFL for tasks assessed/performed Overall Cognitive Status: Within Functional Limits for tasks assessed                                        Exercises Total Joint Exercises Ankle Circles/Pumps: AROM;10 reps;Both;Supine Heel Slides: AROM;Both;10 reps;Supine Straight Leg Raises: AROM;AAROM;Both;5 reps;Supine Bridges: AROM;Both;10 reps;Supine    General Comments        Pertinent Vitals/Pain Pain Assessment: No/denies pain    Home Living                      Prior Function            PT Goals (current goals can now be found in the care plan section) Progress towards PT goals: Progressing toward goals    Frequency           PT Plan Current plan remains appropriate    Co-evaluation              AM-PAC PT "6 Clicks" Daily  Activity  Outcome Measure                   End of Session Equipment Utilized During Treatment: Gait belt;Oxygen Activity Tolerance: Patient limited by fatigue Patient left: in bed Nurse Communication: Mobility status       Time: 9758-8325 PT Time Calculation (min) (ACUTE ONLY): 33 min  Charges:  $Gait Training: 8-22 mins $Therapeutic Exercise: 8-22 mins                    G Codes:       Teena Irani, PTA/CLT [place], [name] 06/14/2017, 9:24 AM

## 2017-06-14 NOTE — Progress Notes (Signed)
Pt. Requested all 4 side rails up. Will continue to monitor.

## 2017-06-14 NOTE — Progress Notes (Addendum)
PROGRESS NOTE  Madison Reed [id number] DOB: 04-05-1951 DOA: 06/10/2017 PCP: Celene Squibb, MD  Brief Narrative: 66 year old woman PMH diabetes mellitus type 2, stroke, debility, recently discharged from skilled nursing facility where she was placed for short-term rehabilitation following hospitalization for acute on chronic diastolic CHF, hypoglycemia, acute kidney injury who presented to the emergency department with reported fever and confusion. Admitted for lobar pneumonia, UTI, acute on chronic diastolic congestive heart failure.  Assessment/Plan Acute on chronic diastolic congestive heart failure. -Continues to diurese well. Renal function stable. Volume overload still present but improving.  -Continue IV Lasix. Check BMP in am.   E coli UTI without sepsis. -Appears resolved at this point.   Lobar pneumonia, left lower lobe. -Resolved. Chronic oxygen requirement stable. Prednisone finished. Complete Ceftin 9/4.  Acute kidney injury superimposed on chronic kidney disease stage III. -Acute kidney injury resolved.  Essential hypertension. -Blood pressure now stable with resumption of amlodipine and hydralazine. Continue carvedilol, doxazosin   Normocytic anemia. Etiology unclear. No evidence of bleeding. -B12 and folate borderline. Ferritin within normal limits but somewhat low. -Continue oral B12 and folate, follow-up as an outpatient.  Chronic hypoxic respiratory failure on 2-4 liters nasal cannula at home. -Remains stable  Diabetes mellitus type 2 -Remains stable. Continue Lantus, sliding scale insulin.  Debility, near bedbound status. -Appreciate physical therapy, social work. Skilled nursing facility was recommended but family prefers to take patient home.  Chronic delirium. -Remains stable.  Demand ischemia, secondary to acute illness, CHF. -No further evaluation suggested.  Stage II pressure injury to sacrum, present on admission -Wound care.    Improving.  Anticipate discharge home 9/4 on oral diuretics and continues to improve.  DVT prophylaxis: enoxaparin Code Status: full Family Communication: none Disposition Plan: Home with home health RN, PT   Murray Hodgkins, MD  [facility] Direct contact: [phone] --Via [place]  --[email or web address]; password [email or web address]  7PM-7AM contact night coverage as above 06/14/2017, 1:55 PM  LOS: 4 days   Consultants:    Procedures:    Antimicrobials:  Ceftin 9/2 >> 9/4  Cefepime 8/30 >> 9/2  Vancomycin 8/30 >> 9/1  Interval history/Subjective: Overall doing well. No complaints.  Objective: Vitals: Afebrile. 98.3, 18, 71, 139/80, 92% on 3 L  Exam:     Constitutional. Appears comfortable, calm.  Respiratory. Clear to auscultation bilaterally. No wheezes, rales or rhonchi. Normal respiratory effort.  Cardiovascular. Regular rate and rhythm. No murmur, rub or gallop. Decreasing but still significant 2+ bilateral lower extremity edema.  Psychiatric. Alert, speech fluent, appropriate.   I have personally reviewed the following:  Urine output - 2950. -6.7 L since admission. Labs:  Capillary blood sugar stable.  Creatinine likely at baseline. Potassium 3.4.  Scheduled Meds: . amLODipine  10 mg Oral Daily  . aspirin  325 mg Oral Daily  . carvedilol  6.25 mg Oral BID WC  . cefUROXime  500 mg Oral BID WC  . doxazosin  4 mg Oral Daily  . enoxaparin (LOVENOX) injection  40 mg Subcutaneous Q24H  . folic acid  1 mg Oral Daily  . furosemide  40 mg Intravenous Q12H  . gabapentin  200 mg Oral QHS  . hydrALAZINE  100 mg Oral Q8H  . insulin aspart  0-9 Units Subcutaneous TID WC  . insulin glargine  12 Units Subcutaneous QHS  . nystatin   Topical TID  . pantoprazole  40 mg Oral Daily  . potassium chloride  40 mEq Oral Daily  . sodium chloride  flush  3 mL Intravenous Q12H  . sodium chloride flush  3 mL Intravenous Q12H  . vitamin B-12  250 mcg Oral Daily   Continuous  Infusions: . sodium chloride 250 mL (06/11/17 1200)    Principal Problem:   Acute diastolic heart failure (HCC) Active Problems:   Difficulty walking   AKI (acute kidney injury) ([place])   Lobar pneumonia (HCC)   Pressure injury of skin   Elevated troponin   DM type 2 (diabetes mellitus, type 2) (HCC)   Chronic respiratory failure with hypoxia (HCC)   Demand ischemia (HCC)   Essential hypertension   Stage II pressure ulcer of sacral region   LOS: 4 days

## 2017-06-15 LAB — URINE CULTURE: Culture: >=100000 — AB

## 2017-06-15 LAB — CULTURE, BLOOD (ROUTINE X 2)
CULTURE: NO GROWTH
Culture: NO GROWTH
SPECIAL REQUESTS: ADEQUATE
Special Requests: ADEQUATE

## 2017-06-15 LAB — BASIC METABOLIC PANEL
Anion gap: 9 (ref 5–15)
BUN: 49 mg/dL — ABNORMAL HIGH (ref 6–20)
CHLORIDE: 94 mmol/L — AB (ref 101–111)
CO2: 35 mmol/L — ABNORMAL HIGH (ref 22–32)
Calcium: 8.9 mg/dL (ref 8.9–10.3)
Creatinine, Ser: 1.8 mg/dL — ABNORMAL HIGH (ref 0.44–1.00)
GFR, EST AFRICAN AMERICAN: 33 mL/min — AB (ref >60)
GFR, EST NON AFRICAN AMERICAN: 29 mL/min — AB (ref >60)
Glucose, Bld: 96 mg/dL (ref 65–99)
POTASSIUM: 4 mmol/L (ref 3.5–5.1)
SODIUM: 138 mmol/L (ref 135–145)

## 2017-06-15 LAB — GLUCOSE, CAPILLARY
GLUCOSE-CAPILLARY: 85 mg/dL (ref 65–99)
GLUCOSE-CAPILLARY: 132 mg/dL — AB (ref 65–99)
GLUCOSE-CAPILLARY: 151 mg/dL — AB (ref 65–99)
GLUCOSE-CAPILLARY: 432 mg/dL — AB (ref 65–99)
Glucose-Capillary: 145 mg/dL — ABNORMAL HIGH (ref 65–99)

## 2017-06-15 MED ORDER — INSULIN ASPART 100 UNIT/ML ~~LOC~~ SOLN
7.0000 [IU] | Freq: Once | SUBCUTANEOUS | Status: AC
  Administered 2017-06-15: 7 [IU] via SUBCUTANEOUS

## 2017-06-15 NOTE — Progress Notes (Addendum)
PROGRESS NOTE  Madison Reed VOZ:[id number] DOB: 02/02/1951 DOA: 06/10/2017 PCP: Celene Squibb, MD  Brief Narrative: 66 year old woman PMH diabetes mellitus type 2, stroke, debility, recently discharged from skilled nursing facility where she was placed for short-term rehabilitation following hospitalization for acute on chronic diastolic CHF, hypoglycemia, acute kidney injury who presented to the emergency department with reported fever and confusion. Admitted for lobar pneumonia, UTI, acute on chronic diastolic congestive heart failure. She has responded well to treatment, lobar pneumonia and UTI have resolved. Main issue is volume overload which is responding to IV diuresis. Anticipate discharge home in the next 48 hours.  Assessment/Plan Acute on chronic diastolic congestive heart failure. -Continues to improve but still has significant volume overload. Continue IV diuresis. BMP in a.m.  E coli UTI without sepsis. -Resolved  Lobar pneumonia, left lower lobe. -Appears resolved. Ceftin finishes today.  Acute kidney injury superimposed on chronic kidney disease stage III. -Acute kidney injury has resolved.  Essential hypertension. -Improved control. Continue amlodipine, hydralazine, carvedilol, doxazosin.   Normocytic anemia. Etiology unclear. No evidence of bleeding. -B12 and folate borderline. Ferritin within normal limits but somewhat low. -Continue oral B12 and folate, follow-up as an outpatient.  Chronic hypoxic respiratory failure on 2-4 liters nasal cannula at home. -Stable  Diabetes mellitus type 2 -Stable. Continue Lantus and sliding scale insulin  Debility, near bedbound status. -Appreciate physical therapy, social work. Skilled nursing facility was recommended but family prefers to take patient home.  Chronic delirium. -Stable  Demand ischemia, secondary to acute illness, CHF. -No further evaluation suggested.  Stage II pressure injury to sacrum, present on  admission -Wound care.    Continues to improve. Anticipate discharge home 9/5. Discussed with husband.  DVT prophylaxis: enoxaparin Code Status: full Family Communication: discussed with husband by telephone Disposition Plan: Home with home health RN, PT   Murray Hodgkins, MD  [facility] Direct contact: [phone] --[place] [place]  --[email or web address]; password [email or web address]  7PM-7AM contact night coverage as above 06/15/2017, 4:02 PM  LOS: 5 days   Consultants:    Procedures:    Antimicrobials:  Ceftin 9/2 >> 9/4  Cefepime 8/30 >> 9/2  Vancomycin 8/30 >> 9/1  Interval history/Subjective: Slept poorly but otherwise feeling well.  Objective: Vitals: Remains afebrile. 98.7, 58, 153/46, 97% on room air.  Exam:     Constitutional. Appears calm, comfortable.  Respiratory. Clear to auscultation bilaterally. No wheezes, rales or rhonchi. Normal respiratory effort.  Cardiovascular. Regular rate and rhythm. No murmur, rub or gallop. Decreasing but still significant 2+ bilateral lower extremity edema.  Psychiatric. Grossly normal mood and affect. Speech fluent and appropriate.   I have personally reviewed the following:  Urine output - 1700. - 8.3 L since admission. Labs:  Blood sugars remain stable.  Creatinine likely at baseline, stable. BUN 49, creatinine 1.80. Potassium within normal limits.  Scheduled Meds: . amLODipine  10 mg Oral Daily  . aspirin  325 mg Oral Daily  . carvedilol  6.25 mg Oral BID WC  . cefUROXime  500 mg Oral BID WC  . doxazosin  4 mg Oral Daily  . enoxaparin (LOVENOX) injection  40 mg Subcutaneous Q24H  . folic acid  1 mg Oral Daily  . furosemide  40 mg Intravenous Q12H  . gabapentin  200 mg Oral QHS  . hydrALAZINE  100 mg Oral Q8H  . insulin aspart  0-9 Units Subcutaneous TID WC  . insulin glargine  12 Units Subcutaneous QHS  . nystatin  Topical TID  . pantoprazole  40 mg Oral Daily  . potassium chloride  40 mEq Oral Daily    . sodium chloride flush  3 mL Intravenous Q12H  . sodium chloride flush  3 mL Intravenous Q12H  . vitamin B-12  250 mcg Oral Daily   Continuous Infusions: . sodium chloride 250 mL (06/11/17 1200)    Principal Problem:   Acute diastolic heart failure (HCC) Active Problems:   Difficulty walking   AKI (acute kidney injury) ([place])   Lobar pneumonia (HCC)   Pressure injury of skin   Elevated troponin   DM type 2 (diabetes mellitus, type 2) (HCC)   Chronic respiratory failure with hypoxia (HCC)   Demand ischemia (HCC)   Essential hypertension   Stage II pressure ulcer of sacral region   LOS: 5 days

## 2017-06-15 NOTE — Progress Notes (Signed)
Physical Therapy Treatment Patient Details Name: Madison Reed MRN: [id number] DOB: 1951/06/20 Today's Date: 06/15/2017    History of Present Illness 66 year old woman PMH diabetes mellitus type 2, stroke, debility, recently discharged from skilled nursing facility where she was placed for short-term rehabilitation following hospitalization for acute on chronic diastolic CHF, hypoglycemia, acute kidney injury who presented to the emergency department with reported fever and confusion. Admitted for lobar pneumonia, UTI, acute on chronic diastolic congestive heart failure.    PT Comments    Patient demonstrates poor sitting balance with frequent falling backwards, limited to a few steps at bedside secondary to c/o fatigue/BLE weakness.  Patient will benefit from continued physical therapy in hospital and recommended venue below to increase strength, balance, endurance for safe ADLs and gait.   Follow Up Recommendations  SNF     Equipment Recommendations  None recommended by PT    Recommendations for Other Services       Precautions / Restrictions Precautions Precautions: Fall Restrictions Weight Bearing Restrictions: No    Mobility  Bed Mobility Overal bed mobility: Needs Assistance Bed Mobility: Supine to Sit;Sit to Supine     Supine to sit: Mod assist Sit to supine: Mod assist   General bed mobility comments: frequent falling backwards   Transfers Overall transfer level: Needs assistance Equipment used: Rolling walker (2 wheeled) Transfers: Sit to/from Stand Sit to Stand: Mod assist            Ambulation/Gait Ambulation/Gait assistance: Mod assist Ambulation Distance (Feet): 3 Feet Assistive device: Rolling walker (2 wheeled) Gait Pattern/deviations: Decreased stride length;Decreased stance time - left     General Gait Details: limited to a few steps due to weakness/fatigue   Stairs            Wheelchair Mobility    Modified Rankin (Stroke Patients  Only)       Balance Overall balance assessment: Needs assistance Sitting-balance support: Bilateral upper extremity supported;Feet unsupported Sitting balance-Leahy Scale: Poor   Postural control: Posterior lean Standing balance support: Bilateral upper extremity supported;During functional activity Standing balance-Leahy Scale: Poor                              Cognition Arousal/Alertness: Awake/alert Behavior During Therapy: WFL for tasks assessed/performed Overall Cognitive Status: Within Functional Limits for tasks assessed                                        Exercises General Exercises - Lower Extremity Ankle Circles/Pumps: AROM;Seated;Both;10 reps Long Arc Quad: Seated;AROM;Both;10 reps Straight Leg Raises: Both;10 reps Hip Flexion/Marching: Seated;AROM    General Comments        Pertinent Vitals/Pain Pain Assessment: No/denies pain    Home Living                      Prior Function            PT Goals (current goals can now be found in the care plan section) Acute Rehab PT Goals Patient Stated Goal: Return home PT Goal Formulation: With patient Time For Goal Achievement: 06/25/17 Potential to Achieve Goals: Fair Progress towards PT goals: Progressing toward goals    Frequency    Min 3X/week      PT Plan Current plan remains appropriate    Co-evaluation  AM-PAC PT "6 Clicks" Daily Activity  Outcome Measure  Difficulty turning over in bed (including adjusting bedclothes, sheets and blankets)?: Unable Difficulty moving from lying on back to sitting on the side of the bed? : Unable Difficulty sitting down on and standing up from a chair with arms (e.g., wheelchair, bedside commode, etc,.)?: Unable Help needed moving to and from a bed to chair (including a wheelchair)?: A Lot Help needed walking in hospital room?: A Lot Help needed climbing 3-5 steps with a railing? : Total 6 Click Score:  8    End of Session Equipment Utilized During Treatment: Gait belt Activity Tolerance: Patient limited by fatigue Patient left: in bed;with call bell/phone within reach Nurse Communication: Mobility status PT Visit Diagnosis: Unsteadiness on feet (R26.81);Other abnormalities of gait and mobility (R26.89);Muscle weakness (generalized) (M62.81);History of falling (Z91.81)     Time: 5051-8335 PT Time Calculation (min) (ACUTE ONLY): 15 min  Charges:  $Therapeutic Activity: 8-22 mins                    G Codes:       4:03 PM, 07-02-17 Lonell Grandchild, MPT Physical Therapist with [facility] [phone] office [phone] mobile phone

## 2017-06-15 NOTE — Care Management Note (Signed)
Case Management Note  Patient Details  Name: Madison Reed MRN: [id number] Date of Birth: [birth year]  Expected Discharge Date:  06/14/17               Expected Discharge Plan:  [place]  In-House Referral:  Clinical Social Work  Discharge planning Services  CM Consult  Post Acute Care Choice:  Home Health, Resumption of Svcs/PTA Provider Choice offered to:     HH Arranged:  RN, PT, Nurse's Aide [place] Agency:  [place]  Status of Service:  Completed, signed off  If discussed at [place] of Stay Meetings, dates discussed:  06/15/2017  Additional Comments: Pt still declines SNF and wants to go home. Pt will DC home today with resumption of [facility] services. She is aware HH has 48 hrs to resume services. She lives with her husband who works during the day. She has family/friends she can call during the day if she needs help. She has walker, WC, oxygen. No DME needs at DC. Vaughan Basta, [facility] rep, aware of DC and will obtain resumption orders from chart.   Sherald Barge, RN 06/15/2017, 12:12 PM

## 2017-06-16 DIAGNOSIS — I5031 Acute diastolic (congestive) heart failure: Secondary | ICD-10-CM

## 2017-06-16 DIAGNOSIS — N3000 Acute cystitis without hematuria: Secondary | ICD-10-CM

## 2017-06-16 DIAGNOSIS — J9611 Chronic respiratory failure with hypoxia: Secondary | ICD-10-CM

## 2017-06-16 DIAGNOSIS — I5033 Acute on chronic diastolic (congestive) heart failure: Secondary | ICD-10-CM

## 2017-06-16 LAB — BASIC METABOLIC PANEL
Anion gap: 9 (ref 5–15)
BUN: 47 mg/dL — AB (ref 6–20)
CHLORIDE: 91 mmol/L — AB (ref 101–111)
CO2: 37 mmol/L — ABNORMAL HIGH (ref 22–32)
CREATININE: 1.76 mg/dL — AB (ref 0.44–1.00)
Calcium: 8.8 mg/dL — ABNORMAL LOW (ref 8.9–10.3)
GFR, EST AFRICAN AMERICAN: 34 mL/min — AB (ref >60)
GFR, EST NON AFRICAN AMERICAN: 30 mL/min — AB (ref >60)
Glucose, Bld: 96 mg/dL (ref 65–99)
Potassium: 3.7 mmol/L (ref 3.5–5.1)
SODIUM: 137 mmol/L (ref 135–145)

## 2017-06-16 LAB — GLUCOSE, CAPILLARY
GLUCOSE-CAPILLARY: 101 mg/dL — AB (ref 65–99)
GLUCOSE-CAPILLARY: 159 mg/dL — AB (ref 65–99)
Glucose-Capillary: 175 mg/dL — ABNORMAL HIGH (ref 65–99)
Glucose-Capillary: 169 mg/dL — ABNORMAL HIGH (ref 65–99)

## 2017-06-16 MED ORDER — ONDANSETRON HCL 4 MG/2ML IJ SOLN
4.0000 mg | Freq: Once | INTRAMUSCULAR | Status: AC
  Administered 2017-06-16: 4 mg via INTRAVENOUS
  Filled 2017-06-16: qty 2

## 2017-06-16 MED ORDER — FOLIC ACID 1 MG PO TABS: 1.0000 mg | ORAL_TABLET | Freq: Every day | ORAL | 0 refills | Status: AC

## 2017-06-16 MED ORDER — CYANOCOBALAMIN 1000 MCG PO TABS: 1000.0000 ug | ORAL_TABLET | Freq: Every day | ORAL | 1 refills | Status: DC

## 2017-06-16 MED ORDER — FUROSEMIDE 40 MG PO TABS: 40.0000 mg | ORAL_TABLET | Freq: Two times a day (BID) | ORAL | 11 refills | Status: DC

## 2017-06-16 NOTE — Progress Notes (Signed)
Pt was weighed on the bed this am. She is not able to stand for more than a few seconds so using a standup scale is out of the question. We removed all the blankets and gown while we were bathing patient. We took this time to take a bed weight and results are 166lbs. This is a signigicant difference from yesterday. Not sure if maybe the bed weight from this am is incorrect or the bed weight from yesterday is incorrect.

## 2017-06-16 NOTE — Care Management Important Message (Signed)
Important Message  Patient Details  Name: Madison Reed MRN: [id number] Date of Birth: 08-May-1951   Medicare Important Message Given:  Yes    Sherald Barge, RN 06/16/2017, 1:20 PM

## 2017-06-16 NOTE — Progress Notes (Signed)
PROGRESS NOTE  Madison Reed LPF:[id number] DOB: [birth year] DOA: 06/10/2017 PCP: Celene Squibb, MD  Brief Narrative: 66 year old woman PMH diabetes mellitus type 2, stroke, debility, recently discharged from skilled nursing facility where she was placed for short-term rehabilitation following hospitalization for acute on chronic diastolic CHF, hypoglycemia, acute kidney injury who presented to the emergency department with reported fever and confusion. Admitted for lobar pneumonia, UTI, acute on chronic diastolic congestive heart failure. She has responded well to treatment, lobar pneumonia and UTI have resolved. Main issue is volume overload which is responding to IV diuresis. Anticipate discharge home in the next 48 hours.  Assessment/Plan Acute on chronic diastolic congestive heart failure. -Continues to improve but still has significant volume overload. Continue IV diuresis. BMP in a.m.  E coli UTI without sepsis. -Resolved, completed abx   Lobar pneumonia, left lower lobe. -Appears resolved. Ceftin finishes today.  Acute kidney injury superimposed on chronic kidney disease stage III. -Acute kidney injury has resolved.  Essential hypertension. -Improved control. Continue amlodipine, hydralazine, carvedilol, doxazosin.   Normocytic anemia. Etiology unclear. No evidence of bleeding. -B12 and folate borderline. Ferritin within normal limits but somewhat low. -Continue oral B12 and folate, follow-up as an outpatient.  Chronic hypoxic respiratory failure on 2-4 liters nasal cannula at home. -Stable  Diabetes mellitus type 2 -Stable. Continue Lantus and sliding scale insulin  Debility, near bedbound status. -Appreciate physical therapy, social work. Skilled nursing facility was recommended but family prefers to take patient home.  Chronic delirium. -Stable  Demand ischemia, secondary to acute illness, CHF. -No further evaluation suggested.  Stage II pressure injury to sacrum,  present on admission -Wound care.      DVT prophylaxis: enoxaparin Code Status: full Family Communication: discussed with husband by telephone Disposition Plan: Home with home health RN, vs SNF ,  PT      Consultants:    Procedures:    Antimicrobials: Anti-infectives    Start     Dose/Rate Route Frequency Ordered Stop   06/13/17 1700  cefUROXime (CEFTIN) tablet 500 mg     500 mg Oral 2 times daily with meals 06/13/17 1234 06/16/17 1659   06/11/17 1000  ceFEPIme (MAXIPIME) 1 g in dextrose 5 % 50 mL IVPB  Status:  Discontinued     1 g 100 mL/hr over 30 Minutes Intravenous Every 24 hours 06/10/17 1410 06/13/17 1234   06/10/17 1600  vancomycin (VANCOCIN) IVPB 1000 mg/200 mL premix  Status:  Discontinued     1,000 mg 200 mL/hr over 60 Minutes Intravenous Every 24 hours 06/10/17 1410 06/12/17 1143   06/10/17 1400  aztreonam (AZACTAM) 2 g in dextrose 5 % 50 mL IVPB  Status:  Discontinued     2 g 100 mL/hr over 30 Minutes Intravenous Every 8 hours 06/10/17 1349 06/10/17 1409   06/10/17 1030  vancomycin (VANCOCIN) IVPB 1000 mg/200 mL premix     1,000 mg 200 mL/hr over 60 Minutes Intravenous  Once 06/10/17 1020 06/10/17 1207   06/10/17 1030  ceFEPIme (MAXIPIME) 2 g in dextrose 5 % 50 mL IVPB     2 g 100 mL/hr over 30 Minutes Intravenous  Once 06/10/17 1020 06/10/17 1106         Interval history/Subjective: Does not feel well,states she is still sob  Objective: Vitals:   Today's Vitals   06/16/17 0435 06/16/17 0500 06/16/17 0930 06/16/17 1047  BP: (!) 159/82     Pulse: 67     Resp: 20  Temp: 98.3 F (36.8 C)     TempSrc: Oral     SpO2: 99%   92%  Weight:  75.3 kg (166 lb)    Height:      PainSc:   0-No pain      Exam:     Constitutional. Appears calm, comfortable.  Respiratory. Clear to auscultation bilaterally. No wheezes, rales or rhonchi. Normal respiratory effort.  Cardiovascular. Regular rate and rhythm. No murmur, rub or gallop. Decreasing but  still significant 2+ bilateral lower extremity edema.  Psychiatric. Grossly normal mood and affect. Speech fluent and appropriate.    Labs:  Blood sugars remain stable.  Creatinine likely at baseline, stable. BUN 49, creatinine 1.80. Potassium within normal limits.  Scheduled Meds: . amLODipine  10 mg Oral Daily  . aspirin  325 mg Oral Daily  . carvedilol  6.25 mg Oral BID WC  . cefUROXime  500 mg Oral BID WC  . doxazosin  4 mg Oral Daily  . enoxaparin (LOVENOX) injection  40 mg Subcutaneous Q24H  . folic acid  1 mg Oral Daily  . furosemide  40 mg Intravenous Q12H  . gabapentin  200 mg Oral QHS  . hydrALAZINE  100 mg Oral Q8H  . insulin aspart  0-9 Units Subcutaneous TID WC  . insulin glargine  12 Units Subcutaneous QHS  . nystatin   Topical TID  . pantoprazole  40 mg Oral Daily  . potassium chloride  40 mEq Oral Daily  . sodium chloride flush  3 mL Intravenous Q12H  . sodium chloride flush  3 mL Intravenous Q12H  . vitamin B-12  250 mcg Oral Daily   Continuous Infusions: . sodium chloride 250 mL (06/11/17 1200)    Principal Problem:   Acute diastolic heart failure (HCC) Active Problems:   Difficulty walking   AKI (acute kidney injury) ([place])   Lobar pneumonia (HCC)   Pressure injury of skin   Elevated troponin   DM type 2 (diabetes mellitus, type 2) (HCC)   Chronic respiratory failure with hypoxia (HCC)   Demand ischemia (HCC)   Essential hypertension   Stage II pressure ulcer of sacral region   Acute cystitis without hematuria   LOS: 6 days

## 2017-06-16 NOTE — Progress Notes (Signed)
Patient refuses all her medication, patient asked if she would like them after eating breakfast, patient states "No I do not want any medication at all".

## 2017-06-17 DIAGNOSIS — N179 Acute kidney failure, unspecified: Secondary | ICD-10-CM

## 2017-06-17 LAB — CBC
HEMATOCRIT: 30.8 % — AB (ref 36.0–46.0)
Hemoglobin: 9.5 g/dL — ABNORMAL LOW (ref 12.0–15.0)
MCH: 28.5 pg (ref 26.0–34.0)
MCHC: 30.8 g/dL (ref 30.0–36.0)
MCV: 92.5 fL (ref 78.0–100.0)
PLATELETS: 177 10*3/uL (ref 150–400)
RBC: 3.33 MIL/uL — ABNORMAL LOW (ref 3.87–5.11)
RDW: 15.1 % (ref 11.5–15.5)
WBC: 5.7 10*3/uL (ref 4.0–10.5)

## 2017-06-17 LAB — COMPREHENSIVE METABOLIC PANEL
ALBUMIN: 2.9 g/dL — AB (ref 3.5–5.0)
ALT: 15 U/L (ref 14–54)
AST: 22 U/L (ref 15–41)
Alkaline Phosphatase: 33 U/L — ABNORMAL LOW (ref 38–126)
Anion gap: 8 (ref 5–15)
BILIRUBIN TOTAL: 0.8 mg/dL (ref 0.3–1.2)
BUN: 41 mg/dL — AB (ref 6–20)
CHLORIDE: 91 mmol/L — AB (ref 101–111)
CO2: 37 mmol/L — ABNORMAL HIGH (ref 22–32)
CREATININE: 1.7 mg/dL — AB (ref 0.44–1.00)
Calcium: 8.7 mg/dL — ABNORMAL LOW (ref 8.9–10.3)
GFR calc Af Amer: 36 mL/min — ABNORMAL LOW (ref >60)
GFR calc non Af Amer: 31 mL/min — ABNORMAL LOW (ref >60)
GLUCOSE: 116 mg/dL — AB (ref 65–99)
POTASSIUM: 3.4 mmol/L — AB (ref 3.5–5.1)
Sodium: 136 mmol/L (ref 135–145)
Total Protein: 6.1 g/dL — ABNORMAL LOW (ref 6.5–8.1)

## 2017-06-17 LAB — GLUCOSE, CAPILLARY
Glucose-Capillary: 119 mg/dL — ABNORMAL HIGH (ref 65–99)
Glucose-Capillary: 144 mg/dL — ABNORMAL HIGH (ref 65–99)

## 2017-06-17 MED ORDER — CYANOCOBALAMIN 1000 MCG PO TABS: 1000.0000 ug | ORAL_TABLET | Freq: Every day | ORAL | 1 refills | Status: AC

## 2017-06-17 MED ORDER — POTASSIUM CHLORIDE CRYS ER 20 MEQ PO TBCR: 40.0000 meq | EXTENDED_RELEASE_TABLET | Freq: Every day | ORAL | 1 refills | Status: DC

## 2017-06-17 MED ORDER — POTASSIUM CHLORIDE CRYS ER 20 MEQ PO TBCR
60.0000 meq | EXTENDED_RELEASE_TABLET | Freq: Once | ORAL | Status: AC
  Administered 2017-06-17: 60 meq via ORAL
  Filled 2017-06-17: qty 3

## 2017-06-17 MED ORDER — FUROSEMIDE 40 MG PO TABS
60.0000 mg | ORAL_TABLET | Freq: Every day | ORAL | Status: DC
  Administered 2017-06-17: 11:00:00 60 mg via ORAL
  Filled 2017-06-17: qty 1

## 2017-06-17 MED ORDER — FUROSEMIDE 40 MG PO TABS: 40.0000 mg | ORAL_TABLET | Freq: Two times a day (BID) | ORAL | 11 refills | Status: DC

## 2017-06-17 NOTE — Care Management (Signed)
EMS transport arranged for DC home at pt/family request.

## 2017-06-17 NOTE — Progress Notes (Signed)
Unable to get patient weight this am. Scale on bed not working. Pt is too weak to stand on scale.

## 2017-06-17 NOTE — Discharge Summary (Signed)
Physician Discharge Summary  Madison Reed MRN: [id number] DOB/AGE: January 13, 1951 66 y.o.  PCP: Celene Squibb, MD   Admit date: 06/10/2017 Discharge date: 06/17/2017  Discharge Diagnoses:    Principal Problem:   Acute on chronic diastolic heart failure (HCC) Active Problems:   Difficulty walking   AKI (acute kidney injury) ([place])   Lobar pneumonia (HCC)   Pressure injury of skin   Elevated troponin   DM type 2 (diabetes mellitus, type 2) (HCC)   Chronic respiratory failure with hypoxia (HCC)   Demand ischemia (HCC)   Essential hypertension   Stage II pressure ulcer of sacral region   Acute cystitis without hematuria    Follow-up recommendations Follow-up with PCP in 3-5 days , including all  additional recommended appointments as below Follow-up CBC, CMP in 3-5 days Patient is being discharged with home health PCP to monitor renal function closely       Current Discharge Medication List    START taking these medications   Details  folic acid (FOLVITE) 1 MG tablet Take 1 tablet (1 mg total) by mouth daily. Qty: 30 tablet, Refills: 0    furosemide (LASIX) 40 MG tablet Take 1 tablet (40 mg total) by mouth 2 (two) times daily. Qty: 60 tablet, Refills: 11    vitamin B-12 1000 MCG tablet Take 1 tablet (1,000 mcg total) by mouth daily. Qty: 60 tablet, Refills: 1      CONTINUE these medications which have NOT CHANGED   Details  amLODipine (NORVASC) 10 MG tablet Take 10 mg by mouth daily.    aspirin 325 MG tablet Take 1 tablet (325 mg total) by mouth daily.    blood glucose meter kit and supplies KIT Dispense based on patient and insurance preference. Use up to four times daily as directed. (FOR ICD-9 250.00, 250.01). Qty: 1 each, Refills: 0    carvedilol (COREG) 6.25 MG tablet Take 1 tablet (6.25 mg total) by mouth 2 (two) times daily with a meal. Qty: 60 tablet, Refills: 2    doxazosin (CARDURA) 4 MG tablet Take 1 tablet (4 mg total) by mouth daily. Qty: 30 tablet,  Refills: 1    fenofibrate 160 MG tablet Take 160 mg by mouth daily.    fexofenadine (ALLEGRA) 180 MG tablet Take 180 mg by mouth daily.    gabapentin (NEURONTIN) 100 MG capsule Take 200 mg by mouth at bedtime.     hydrALAZINE (APRESOLINE) 100 MG tablet Take 1 tablet (100 mg total) by mouth every 8 (eight) hours. Qty: 90 tablet, Refills: 1    insulin glargine (LANTUS) 100 UNIT/ML injection Inject 0.12 mLs (12 Units total) into the skin daily. Qty: 10 mL, Refills: 11    omeprazole (PRILOSEC) 20 MG capsule Take 20 mg by mouth daily.    ondansetron (ZOFRAN) 4 MG tablet Take 4 mg by mouth every 8 (eight) hours as needed for nausea or vomiting.    potassium chloride SA (K-DUR,KLOR-CON) 20 MEQ tablet Take 40 mEq by mouth daily.    sitaGLIPtin (JANUVIA) 25 MG tablet Take 25 mg by mouth daily.      STOP taking these medications     torsemide (DEMADEX) 20 MG tablet          Discharge Condition: Stable   Discharge Instructions Get Medicines reviewed and adjusted: Please take all your medications with you for your next visit with your Primary MD  Please request your Primary MD to go over all hospital tests and procedure/radiological results at the follow  up, please ask your Primary MD to get all Hospital records sent to his/her office.  If you experience worsening of your admission symptoms, develop shortness of breath, life threatening emergency, suicidal or homicidal thoughts you must seek medical attention immediately by calling 911 or calling your MD immediately if symptoms less severe.  You must read complete instructions/literature along with all the possible adverse reactions/side effects for all the Medicines you take and that have been prescribed to you. Take any new Medicines after you have completely understood and accpet all the possible adverse reactions/side effects.   Do not drive when taking Pain medications.   Do not take more than prescribed Pain, Sleep and  Anxiety Medications  Special Instructions: If you have smoked or chewed Tobacco in the last 2 yrs please stop smoking, stop any regular Alcohol and or any Recreational drug use.  Wear Seat belts while driving.  Please note  You were cared for by a hospitalist during your hospital stay. Once you are discharged, your primary care physician will handle any further medical issues. Please note that NO REFILLS for any discharge medications will be authorized once you are discharged, as it is imperative that you return to your primary care physician (or establish a relationship with a primary care physician if you do not have one) for your aftercare needs so that they can reassess your need for medications and monitor your lab values.  Discharge Instructions    Diet - low sodium heart healthy    Complete by:  As directed    Increase activity slowly    Complete by:  As directed        Allergies  Allergen Reactions  . Glipizide     Severe hypoglycemia (22!)  . Penicillins Itching    Has patient had a PCN reaction causing immediate rash, facial/tongue/throat swelling, SOB or lightheadedness with hypotension: Yes Has patient had a PCN reaction causing severe rash involving mucus membranes or skin necrosis: No Has patient had a PCN reaction that required hospitalization: No Has patient had a PCN reaction occurring within the last 10 years: No If all of the above answers are "NO", then may proceed with Cephalosporin use.   . Clonidine Derivatives Other (See Comments)    Numbness  to tongue face, and arm  . Codeine Itching  . Hydrocodone-Acetaminophen Itching  . Plasticized Base [Plastibase]   . Shellfish Allergy Itching  . Tape   . Sulfa Antibiotics Itching and Rash      Disposition: 01-Home or Self Care   Consults: None    Significant Diagnostic Studies:  Dg Chest 2 View  Result Date: 06/10/2017 CLINICAL DATA:  Altered mental status, previous CVA with right hemi paresis.  History of diabetes, CHF, chronic respiratory failure on home oxygen. Recent UTI. EXAM: CHEST  2 VIEW COMPARISON:  Chest x-ray of May 13, 2017 FINDINGS: The lungs are reasonably well inflated. There is increased density in the left hemithorax obscuring the left heart border. The interstitial markings are coarse. There are small amounts of pleural fluid blunting the costophrenic angles. The cardiac silhouette is enlarged. The pulmonary vascularity is mildly engorged. IMPRESSION: Probable left lower lobe pneumonia. Superimposed mild to moderate CHF. Electronically Signed   By: David  Martinique M.D.   On: 06/10/2017 10:13   Ct Head Wo Contrast  Result Date: 06/10/2017 CLINICAL DATA:  Altered mental status. EXAM: CT HEAD WITHOUT CONTRAST TECHNIQUE: Contiguous axial images were obtained from the base of the skull through the vertex without  intravenous contrast. COMPARISON:  MRI brain dated August 03, 2013. CT head dated August 03, 2013. FINDINGS: Brain: No evidence of acute infarction, hemorrhage, hydrocephalus, extra-axial collection or mass lesion/mass effect. 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 Periventricular white matter and corona radiata hypodensities favor chronic ischemic microvascular white matter disease. Vascular: Intracranial atherosclerotic vascular calcifications. No hyperdense vessel. Skull: Normal. Negative for fracture or focal lesion. Sinuses/Orbits: The bilateral paranasal sinuses and mastoid air cells are clear. The orbits are unremarkable. Other: None. IMPRESSION: 1. No acute intracranial abnormality. 2. 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 Electronically Signed   By: Titus Dubin M.D.   On: 06/10/2017 10:42        Filed Weights   06/14/17 0502 06/15/17 0546  06/16/17 0500  Weight: 90.8 kg (200 lb 3.2 oz) 88.4 kg (194 lb 12.8 oz) 75.3 kg (166 lb)     Microbiology: Recent Results (from the past 240 hour(s))  Urine culture     Status: Abnormal   Collection Time: 06/10/17  9:27 AM  Result Value Ref Range Status   Specimen Description URINE, CATHETERIZED  Final   Special Requests NONE  Final   Culture (A)  Final    >=100,000 COLONIES/mL ESCHERICHIA COLI >=100,000 COLONIES/mL AEROCOCCUS URINAE Standardized susceptibility testing for this organism is not available. Performed at [facility], [place] [address] [place], [place] [address]    Report Status 06/15/2017 FINAL  Final   Organism ID, Bacteria ESCHERICHIA COLI (A)  Final      Susceptibility   Escherichia coli - MIC*    AMPICILLIN >=32 RESISTANT Resistant     CEFAZOLIN <=4 SENSITIVE Sensitive     CEFTRIAXONE <=1 SENSITIVE Sensitive     CIPROFLOXACIN >=4 RESISTANT Resistant     GENTAMICIN <=1 SENSITIVE Sensitive     IMIPENEM <=0.25 SENSITIVE Sensitive     NITROFURANTOIN <=16 SENSITIVE Sensitive     TRIMETH/SULFA <=20 SENSITIVE Sensitive     AMPICILLIN/SULBACTAM 16 INTERMEDIATE Intermediate     PIP/TAZO <=4 SENSITIVE Sensitive     Extended ESBL NEGATIVE Sensitive     * >=100,000 COLONIES/mL ESCHERICHIA COLI  Culture, blood (Routine x 2)     Status: None   Collection Time: 06/10/17 10:28 AM  Result Value Ref Range Status   Specimen Description BLOOD RIGHT ARM  Final   Special Requests   Final    BOTTLES DRAWN AEROBIC AND ANAEROBIC Blood Culture adequate volume   Culture NO GROWTH 5 DAYS  Final   Report Status 06/15/2017 FINAL  Final  Culture, blood (Routine x 2)     Status: None   Collection Time: 06/10/17 10:29 AM  Result Value Ref Range Status   Specimen Description BLOOD LEFT HAND  Final   Special Requests   Final    BOTTLES DRAWN AEROBIC ONLY Blood Culture adequate volume   Culture NO GROWTH 5 DAYS  Final   Report Status 06/15/2017 FINAL  Final  MRSA PCR Screening      Status: None   Collection Time: 06/10/17 12:07 PM  Result Value Ref Range Status   MRSA by PCR NEGATIVE NEGATIVE Final    Comment:        The GeneXpert MRSA Assay (FDA approved for NASAL specimens only), is one component of a comprehensive MRSA colonization surveillance program. It is  not intended to diagnose MRSA infection nor to guide or monitor treatment for MRSA infections.        Blood Culture    Component Value Date/Time   SDES BLOOD LEFT HAND 06/10/2017 1029   SPECREQUEST  06/10/2017 1029    BOTTLES DRAWN AEROBIC ONLY Blood Culture adequate volume   CULT NO GROWTH 5 DAYS 06/10/2017 1029   REPTSTATUS 06/15/2017 FINAL 06/10/2017 1029      Labs: Results for orders placed or performed during the hospital encounter of 06/10/17 (from the past 48 hour(s))  Glucose, capillary     Status: Abnormal   Collection Time: 06/15/17 11:07 AM  Result Value Ref Range   Glucose-Capillary 132 (H) 65 - 99 mg/dL   Comment 1 Notify RN    Comment 2 Document in Chart   Glucose, capillary     Status: Abnormal   Collection Time: 06/15/17  4:39 PM  Result Value Ref Range   Glucose-Capillary 145 (H) 65 - 99 mg/dL  Glucose, capillary     Status: Abnormal   Collection Time: 06/15/17  4:52 PM  Result Value Ref Range   Glucose-Capillary 151 (H) 65 - 99 mg/dL   Comment 1 Notify RN    Comment 2 Document in Chart   Glucose, capillary     Status: Abnormal   Collection Time: 06/15/17  9:51 PM  Result Value Ref Range   Glucose-Capillary 432 (H) 65 - 99 mg/dL   Comment 1 Notify RN    Comment 2 Document in Chart   Basic metabolic panel     Status: Abnormal   Collection Time: 06/16/17  6:49 AM  Result Value Ref Range   Sodium 137 135 - 145 mmol/L   Potassium 3.7 3.5 - 5.1 mmol/L   Chloride 91 (L) 101 - 111 mmol/L   CO2 37 (H) 22 - 32 mmol/L   Glucose, Bld 96 65 - 99 mg/dL   BUN 47 (H) 6 - 20 mg/dL   Creatinine, Ser 1.76 (H) 0.44 - 1.00 mg/dL   Calcium 8.8 (L) 8.9 - 10.3 mg/dL   GFR calc  non Af Amer 30 (L) >60 mL/min   GFR calc Af Amer 34 (L) >60 mL/min    Comment: (NOTE) The eGFR has been calculated using the CKD EPI equation. This calculation has not been validated in all clinical situations. eGFR's persistently <60 mL/min signify possible Chronic Kidney Disease.    Anion gap 9 5 - 15  Glucose, capillary     Status: Abnormal   Collection Time: 06/16/17  7:20 AM  Result Value Ref Range   Glucose-Capillary 101 (H) 65 - 99 mg/dL  Glucose, capillary     Status: Abnormal   Collection Time: 06/16/17 11:29 AM  Result Value Ref Range   Glucose-Capillary 159 (H) 65 - 99 mg/dL  Glucose, capillary     Status: Abnormal   Collection Time: 06/16/17  4:19 PM  Result Value Ref Range   Glucose-Capillary 175 (H) 65 - 99 mg/dL  Glucose, capillary     Status: Abnormal   Collection Time: 06/16/17  9:09 PM  Result Value Ref Range   Glucose-Capillary 169 (H) 65 - 99 mg/dL   Comment 1 Notify RN    Comment 2 Document in Chart   Comprehensive metabolic panel     Status: Abnormal   Collection Time: 06/17/17  6:22 AM  Result Value Ref Range   Sodium 136 135 - 145 mmol/L   Potassium 3.4 (L) 3.5 - 5.1 mmol/L  Chloride 91 (L) 101 - 111 mmol/L   CO2 37 (H) 22 - 32 mmol/L   Glucose, Bld 116 (H) 65 - 99 mg/dL   BUN 41 (H) 6 - 20 mg/dL   Creatinine, Ser 1.70 (H) 0.44 - 1.00 mg/dL   Calcium 8.7 (L) 8.9 - 10.3 mg/dL   Total Protein 6.1 (L) 6.5 - 8.1 g/dL   Albumin 2.9 (L) 3.5 - 5.0 g/dL   AST 22 15 - 41 U/L   ALT 15 14 - 54 U/L   Alkaline Phosphatase 33 (L) 38 - 126 U/L   Total Bilirubin 0.8 0.3 - 1.2 mg/dL   GFR calc non Af Amer 31 (L) >60 mL/min   GFR calc Af Amer 36 (L) >60 mL/min    Comment: (NOTE) The eGFR has been calculated using the CKD EPI equation. This calculation has not been validated in all clinical situations. eGFR's persistently <60 mL/min signify possible Chronic Kidney Disease.    Anion gap 8 5 - 15  CBC     Status: Abnormal   Collection Time: 06/17/17  6:22 AM   Result Value Ref Range   WBC 5.7 4.0 - 10.5 K/uL   RBC 3.33 (L) 3.87 - 5.11 MIL/uL   Hemoglobin 9.5 (L) 12.0 - 15.0 g/dL   HCT 30.8 (L) 36.0 - 46.0 %   MCV 92.5 78.0 - 100.0 fL   MCH 28.5 26.0 - 34.0 pg   MCHC 30.8 30.0 - 36.0 g/dL   RDW 15.1 11.5 - 15.5 %   Platelets 177 150 - 400 K/uL  Glucose, capillary     Status: Abnormal   Collection Time: 06/17/17  7:31 AM  Result Value Ref Range   Glucose-Capillary 119 (H) 65 - 99 mg/dL     Lipid Panel     Component Value Date/Time   CHOL 179 08/03/2013 1507   TRIG 344 (H) 08/03/2013 1507   HDL 29 (L) 08/03/2013 1507   CHOLHDL 6.2 08/03/2013 1507   VLDL 69 (H) 08/03/2013 1507   LDLCALC 81 08/03/2013 1507     Lab Results  Component Value Date   HGBA1C 5.6 05/01/2017   HGBA1C 10.1 (H) 03/21/2015   HGBA1C 9.4 (H) 08/03/2013     Lab Results  Component Value Date   LDLCALC 81 08/03/2013   CREATININE 1.70 (H) 06/17/2017     HPI :  66 year old woman PMH diabetes mellitus type 2, stroke, debility, recently discharged from skilled nursing facility where she was placed for short-term rehabilitation following hospitalization for acute on chronic diastolic CHF, hypoglycemia, acute kidney injury who presented to the emergency department with reported fever and confusion. Admitted for lobar pneumonia, UTI, acute on chronic diastolic congestive heart failure. She has responded well to treatment, lobar pneumonia and UTI have resolved. Main issue is volume overload which is responding to IV diuresis. Anticipate discharge home in the next 48 hours.   HOSPITAL COURSE:    Acute on chronic diastolic congestive heart failure. -Continues to improve, creatinine slowly trending down with diuresis. Torsemide discontinued and patient has been switched to Lasix 40 mg orally twice a day  E coli UTI without sepsis. -Resolved, completed abx , treated with cefepime and cefuroxime   Lobar pneumonia, left lower lobe. Initially today with  broad-spectrum antibiotics including  vancomycin, and aztreonam -Appears resolved. Ceftin completed subsequently  Acute kidney injury superimposed on chronic kidney disease stage III. -Acute kidney injury has resolved. Creatinine around 1.7 prior to discharge  Essential hypertension. -Improved control. Continue amlodipine,  hydralazine, carvedilol, doxazosin.   Normocytic anemia. Etiology unclear. No evidence of bleeding. -B12 and folate borderline. Ferritin within normal limits but somewhat low. -Continue oral B12 and folate, follow-up as an outpatient.  Chronic hypoxic respiratory failure on 2-4 liters nasal cannula at home. -Stable  Diabetes mellitus type 2 -Stable. Continue Lantus  , Accu-Cheks 3 times a day  Debility, near bedbound status. -Appreciate physical therapy, social work. Skilled nursing facility was recommended but family prefers to take patient home with home health.  Chronic delirium. -Stable  Demand ischemia, secondary to acute illness, CHF. -No further evaluation suggested.  Stage II pressure injury to sacrum, present on admission -Wound care.   Discharge Exam:   Blood pressure (!) 146/46, pulse 73, temperature 98.3 F (36.8 C), temperature source Oral, resp. rate 20, height 5' 6"  (1.676 m), weight 75.3 kg (166 lb), SpO2 94 %.  Constitutional. Appears calm, comfortable.  Respiratory. Clear to auscultation bilaterally. No wheezes, rales or rhonchi. Normal respiratory effort.  Cardiovascular. Regular rate and rhythm. No murmur, rub or gallop. Decreasing but still significant 2+ bilateral lower extremity edema.  Psychiatric. Grossly normal mood and affect. Speech fluent and appropriate     Follow-up Information    Celene Squibb, MD. Call.   Specialty:  Internal Medicine Why:  Hospital follow-up in 3-5 days Contact information: [place] [address] [phone]           Signed: Reyne Dumas 06/17/2017, 9:38 AM         Time spent >1 hour

## 2017-06-17 NOTE — Clinical Social Work Note (Signed)
Patient is going home at discharge.   LCSW signing off.       Quashaun Lazalde D, LCSW  

## 2017-06-17 NOTE — Care Management Note (Signed)
Case Management Note  Patient Details  Name: Madison Reed MRN: [id number] Date of Birth: [birth year]  Expected Discharge Date:  06/17/17               Expected Discharge Plan:  [place]  In-House Referral:  Clinical Social Work  Discharge planning Services  CM Consult  Post Acute Care Choice:  Home Health, Resumption of Svcs/PTA Provider Choice offered to:   Pt  HH Arranged:  RN, PT, Nurse's Aide [place] Agency:  [place] [facility]  Status of Service:  Completed, signed off  If discussed at [place] of Stay Meetings, dates discussed:  06/17/2017  Additional Comments: Pt discharging today. [facility] updated on DC date.   Sherald Barge, RN 06/17/2017, 11:15 AM

## 2017-06-18 NOTE — Progress Notes (Signed)
Discharged home via EMS in stable condition with oxygen, instructions given to daughter.

## 2017-06-19 DIAGNOSIS — N183 Chronic kidney disease, stage 3 (moderate): Secondary | ICD-10-CM | POA: Diagnosis not present

## 2017-06-19 DIAGNOSIS — I130 Hypertensive heart and chronic kidney disease with heart failure and stage 1 through stage 4 chronic kidney disease, or unspecified chronic kidney disease: Secondary | ICD-10-CM | POA: Diagnosis not present

## 2017-06-19 DIAGNOSIS — G8929 Other chronic pain: Secondary | ICD-10-CM | POA: Diagnosis not present

## 2017-06-19 DIAGNOSIS — Z9981 Dependence on supplemental oxygen: Secondary | ICD-10-CM | POA: Diagnosis not present

## 2017-06-19 DIAGNOSIS — J9611 Chronic respiratory failure with hypoxia: Secondary | ICD-10-CM | POA: Diagnosis not present

## 2017-06-19 DIAGNOSIS — Z7982 Long term (current) use of aspirin: Secondary | ICD-10-CM | POA: Diagnosis not present

## 2017-06-19 DIAGNOSIS — N3000 Acute cystitis without hematuria: Secondary | ICD-10-CM | POA: Diagnosis not present

## 2017-06-19 DIAGNOSIS — I5033 Acute on chronic diastolic (congestive) heart failure: Secondary | ICD-10-CM | POA: Diagnosis not present

## 2017-06-19 DIAGNOSIS — I69351 Hemiplegia and hemiparesis following cerebral infarction affecting right dominant side: Secondary | ICD-10-CM | POA: Diagnosis not present

## 2017-06-19 DIAGNOSIS — E1142 Type 2 diabetes mellitus with diabetic polyneuropathy: Secondary | ICD-10-CM | POA: Diagnosis not present

## 2017-06-19 DIAGNOSIS — Z794 Long term (current) use of insulin: Secondary | ICD-10-CM | POA: Diagnosis not present

## 2017-06-19 DIAGNOSIS — L89152 Pressure ulcer of sacral region, stage 2: Secondary | ICD-10-CM | POA: Diagnosis not present

## 2017-06-19 DIAGNOSIS — E1122 Type 2 diabetes mellitus with diabetic chronic kidney disease: Secondary | ICD-10-CM | POA: Diagnosis not present

## 2017-06-19 DIAGNOSIS — J189 Pneumonia, unspecified organism: Secondary | ICD-10-CM | POA: Diagnosis not present

## 2017-06-19 DIAGNOSIS — M549 Dorsalgia, unspecified: Secondary | ICD-10-CM | POA: Diagnosis not present

## 2017-06-21 DIAGNOSIS — E1122 Type 2 diabetes mellitus with diabetic chronic kidney disease: Secondary | ICD-10-CM | POA: Diagnosis not present

## 2017-06-21 DIAGNOSIS — I130 Hypertensive heart and chronic kidney disease with heart failure and stage 1 through stage 4 chronic kidney disease, or unspecified chronic kidney disease: Secondary | ICD-10-CM | POA: Diagnosis not present

## 2017-06-21 DIAGNOSIS — I5033 Acute on chronic diastolic (congestive) heart failure: Secondary | ICD-10-CM | POA: Diagnosis not present

## 2017-06-21 DIAGNOSIS — N183 Chronic kidney disease, stage 3 (moderate): Secondary | ICD-10-CM | POA: Diagnosis not present

## 2017-06-21 DIAGNOSIS — L89152 Pressure ulcer of sacral region, stage 2: Secondary | ICD-10-CM | POA: Diagnosis not present

## 2017-06-21 DIAGNOSIS — J189 Pneumonia, unspecified organism: Secondary | ICD-10-CM | POA: Diagnosis not present

## 2017-06-22 DIAGNOSIS — I130 Hypertensive heart and chronic kidney disease with heart failure and stage 1 through stage 4 chronic kidney disease, or unspecified chronic kidney disease: Secondary | ICD-10-CM | POA: Diagnosis not present

## 2017-06-22 DIAGNOSIS — J189 Pneumonia, unspecified organism: Secondary | ICD-10-CM | POA: Diagnosis not present

## 2017-06-22 DIAGNOSIS — N183 Chronic kidney disease, stage 3 (moderate): Secondary | ICD-10-CM | POA: Diagnosis not present

## 2017-06-22 DIAGNOSIS — E1122 Type 2 diabetes mellitus with diabetic chronic kidney disease: Secondary | ICD-10-CM | POA: Diagnosis not present

## 2017-06-22 DIAGNOSIS — I5033 Acute on chronic diastolic (congestive) heart failure: Secondary | ICD-10-CM | POA: Diagnosis not present

## 2017-06-22 DIAGNOSIS — L89152 Pressure ulcer of sacral region, stage 2: Secondary | ICD-10-CM | POA: Diagnosis not present

## 2017-06-24 DIAGNOSIS — N183 Chronic kidney disease, stage 3 (moderate): Secondary | ICD-10-CM | POA: Diagnosis not present

## 2017-06-24 DIAGNOSIS — E1122 Type 2 diabetes mellitus with diabetic chronic kidney disease: Secondary | ICD-10-CM | POA: Diagnosis not present

## 2017-06-24 DIAGNOSIS — I5033 Acute on chronic diastolic (congestive) heart failure: Secondary | ICD-10-CM | POA: Diagnosis not present

## 2017-06-24 DIAGNOSIS — I130 Hypertensive heart and chronic kidney disease with heart failure and stage 1 through stage 4 chronic kidney disease, or unspecified chronic kidney disease: Secondary | ICD-10-CM | POA: Diagnosis not present

## 2017-06-24 DIAGNOSIS — L89152 Pressure ulcer of sacral region, stage 2: Secondary | ICD-10-CM | POA: Diagnosis not present

## 2017-06-24 DIAGNOSIS — J189 Pneumonia, unspecified organism: Secondary | ICD-10-CM | POA: Diagnosis not present

## 2017-06-28 NOTE — Progress Notes (Signed)
This encounter was created in error - please disregard.

## 2017-06-29 DIAGNOSIS — J189 Pneumonia, unspecified organism: Secondary | ICD-10-CM | POA: Diagnosis not present

## 2017-06-29 DIAGNOSIS — N183 Chronic kidney disease, stage 3 (moderate): Secondary | ICD-10-CM | POA: Diagnosis not present

## 2017-06-29 DIAGNOSIS — L89152 Pressure ulcer of sacral region, stage 2: Secondary | ICD-10-CM | POA: Diagnosis not present

## 2017-06-29 DIAGNOSIS — I130 Hypertensive heart and chronic kidney disease with heart failure and stage 1 through stage 4 chronic kidney disease, or unspecified chronic kidney disease: Secondary | ICD-10-CM | POA: Diagnosis not present

## 2017-06-29 DIAGNOSIS — E1122 Type 2 diabetes mellitus with diabetic chronic kidney disease: Secondary | ICD-10-CM | POA: Diagnosis not present

## 2017-06-29 DIAGNOSIS — I5033 Acute on chronic diastolic (congestive) heart failure: Secondary | ICD-10-CM | POA: Diagnosis not present

## 2017-06-30 DIAGNOSIS — E119 Type 2 diabetes mellitus without complications: Secondary | ICD-10-CM | POA: Diagnosis not present

## 2017-06-30 DIAGNOSIS — I5033 Acute on chronic diastolic (congestive) heart failure: Secondary | ICD-10-CM | POA: Diagnosis not present

## 2017-06-30 DIAGNOSIS — N182 Chronic kidney disease, stage 2 (mild): Secondary | ICD-10-CM | POA: Diagnosis not present

## 2017-06-30 DIAGNOSIS — I10 Essential (primary) hypertension: Secondary | ICD-10-CM | POA: Diagnosis not present

## 2017-06-30 DIAGNOSIS — J181 Lobar pneumonia, unspecified organism: Secondary | ICD-10-CM | POA: Diagnosis not present

## 2017-06-30 DIAGNOSIS — N183 Chronic kidney disease, stage 3 (moderate): Secondary | ICD-10-CM | POA: Diagnosis not present

## 2017-06-30 DIAGNOSIS — L89152 Pressure ulcer of sacral region, stage 2: Secondary | ICD-10-CM | POA: Diagnosis not present

## 2017-06-30 DIAGNOSIS — J9611 Chronic respiratory failure with hypoxia: Secondary | ICD-10-CM | POA: Diagnosis not present

## 2017-06-30 DIAGNOSIS — D649 Anemia, unspecified: Secondary | ICD-10-CM | POA: Diagnosis not present

## 2017-07-01 DIAGNOSIS — I5033 Acute on chronic diastolic (congestive) heart failure: Secondary | ICD-10-CM | POA: Diagnosis not present

## 2017-07-01 DIAGNOSIS — I130 Hypertensive heart and chronic kidney disease with heart failure and stage 1 through stage 4 chronic kidney disease, or unspecified chronic kidney disease: Secondary | ICD-10-CM | POA: Diagnosis not present

## 2017-07-01 DIAGNOSIS — L89152 Pressure ulcer of sacral region, stage 2: Secondary | ICD-10-CM | POA: Diagnosis not present

## 2017-07-01 DIAGNOSIS — E1122 Type 2 diabetes mellitus with diabetic chronic kidney disease: Secondary | ICD-10-CM | POA: Diagnosis not present

## 2017-07-01 DIAGNOSIS — J189 Pneumonia, unspecified organism: Secondary | ICD-10-CM | POA: Diagnosis not present

## 2017-07-01 DIAGNOSIS — N183 Chronic kidney disease, stage 3 (moderate): Secondary | ICD-10-CM | POA: Diagnosis not present

## 2017-07-02 DIAGNOSIS — I130 Hypertensive heart and chronic kidney disease with heart failure and stage 1 through stage 4 chronic kidney disease, or unspecified chronic kidney disease: Secondary | ICD-10-CM | POA: Diagnosis not present

## 2017-07-02 DIAGNOSIS — J189 Pneumonia, unspecified organism: Secondary | ICD-10-CM | POA: Diagnosis not present

## 2017-07-02 DIAGNOSIS — E1122 Type 2 diabetes mellitus with diabetic chronic kidney disease: Secondary | ICD-10-CM | POA: Diagnosis not present

## 2017-07-02 DIAGNOSIS — L89152 Pressure ulcer of sacral region, stage 2: Secondary | ICD-10-CM | POA: Diagnosis not present

## 2017-07-02 DIAGNOSIS — I5033 Acute on chronic diastolic (congestive) heart failure: Secondary | ICD-10-CM | POA: Diagnosis not present

## 2017-07-02 DIAGNOSIS — N183 Chronic kidney disease, stage 3 (moderate): Secondary | ICD-10-CM | POA: Diagnosis not present

## 2017-07-02 DIAGNOSIS — R69 Illness, unspecified: Secondary | ICD-10-CM | POA: Diagnosis not present

## 2017-07-05 DIAGNOSIS — L89152 Pressure ulcer of sacral region, stage 2: Secondary | ICD-10-CM | POA: Diagnosis not present

## 2017-07-05 DIAGNOSIS — E1122 Type 2 diabetes mellitus with diabetic chronic kidney disease: Secondary | ICD-10-CM | POA: Diagnosis not present

## 2017-07-05 DIAGNOSIS — N183 Chronic kidney disease, stage 3 (moderate): Secondary | ICD-10-CM | POA: Diagnosis not present

## 2017-07-05 DIAGNOSIS — I5033 Acute on chronic diastolic (congestive) heart failure: Secondary | ICD-10-CM | POA: Diagnosis not present

## 2017-07-05 DIAGNOSIS — I130 Hypertensive heart and chronic kidney disease with heart failure and stage 1 through stage 4 chronic kidney disease, or unspecified chronic kidney disease: Secondary | ICD-10-CM | POA: Diagnosis not present

## 2017-07-05 DIAGNOSIS — J189 Pneumonia, unspecified organism: Secondary | ICD-10-CM | POA: Diagnosis not present

## 2017-07-06 ENCOUNTER — Encounter (HOSPITAL_COMMUNITY)
Admission: RE | Admit: 2017-07-06 | Discharge: 2017-07-06 | Disposition: A | Discharge status: Home / Self Care | Payer: Medicare Other | Source: Ambulatory Visit | Attending: Internal Medicine | Admitting: Internal Medicine

## 2017-07-06 DIAGNOSIS — D649 Anemia, unspecified: Secondary | ICD-10-CM | POA: Diagnosis not present

## 2017-07-06 LAB — PREPARE RBC (CROSSMATCH)

## 2017-07-06 LAB — HEMOGLOBIN AND HEMATOCRIT, BLOOD
HEMATOCRIT: 28.3 % — AB (ref 36.0–46.0)
HEMOGLOBIN: 8.9 g/dL — AB (ref 12.0–15.0)

## 2017-07-06 LAB — ABO/RH: ABO/RH(D): O POS

## 2017-07-07 ENCOUNTER — Encounter (HOSPITAL_COMMUNITY): Payer: Medicare Other

## 2017-07-07 ENCOUNTER — Encounter (HOSPITAL_COMMUNITY)
Admission: RE | Admit: 2017-07-07 | Discharge: 2017-07-07 | Disposition: A | Discharge status: Home / Self Care | Payer: Medicare Other | Source: Ambulatory Visit | Attending: Internal Medicine | Admitting: Internal Medicine

## 2017-07-07 DIAGNOSIS — D649 Anemia, unspecified: Secondary | ICD-10-CM | POA: Diagnosis not present

## 2017-07-07 MED ORDER — SODIUM CHLORIDE 0.9 % IV SOLN: Freq: Once | INTRAVENOUS | Status: DC

## 2017-07-08 DIAGNOSIS — N183 Chronic kidney disease, stage 3 (moderate): Secondary | ICD-10-CM | POA: Diagnosis not present

## 2017-07-08 DIAGNOSIS — E1122 Type 2 diabetes mellitus with diabetic chronic kidney disease: Secondary | ICD-10-CM | POA: Diagnosis not present

## 2017-07-08 DIAGNOSIS — I5032 Chronic diastolic (congestive) heart failure: Secondary | ICD-10-CM | POA: Diagnosis not present

## 2017-07-08 DIAGNOSIS — J189 Pneumonia, unspecified organism: Secondary | ICD-10-CM | POA: Diagnosis not present

## 2017-07-08 DIAGNOSIS — L89152 Pressure ulcer of sacral region, stage 2: Secondary | ICD-10-CM | POA: Diagnosis not present

## 2017-07-08 DIAGNOSIS — L89899 Pressure ulcer of other site, unspecified stage: Secondary | ICD-10-CM | POA: Diagnosis not present

## 2017-07-08 DIAGNOSIS — D649 Anemia, unspecified: Secondary | ICD-10-CM | POA: Diagnosis not present

## 2017-07-08 DIAGNOSIS — I5033 Acute on chronic diastolic (congestive) heart failure: Secondary | ICD-10-CM | POA: Diagnosis not present

## 2017-07-08 DIAGNOSIS — I10 Essential (primary) hypertension: Secondary | ICD-10-CM | POA: Diagnosis not present

## 2017-07-08 DIAGNOSIS — I130 Hypertensive heart and chronic kidney disease with heart failure and stage 1 through stage 4 chronic kidney disease, or unspecified chronic kidney disease: Secondary | ICD-10-CM | POA: Diagnosis not present

## 2017-07-08 DIAGNOSIS — J9611 Chronic respiratory failure with hypoxia: Secondary | ICD-10-CM | POA: Diagnosis not present

## 2017-07-08 DIAGNOSIS — N189 Chronic kidney disease, unspecified: Secondary | ICD-10-CM | POA: Diagnosis not present

## 2017-07-08 LAB — TYPE AND SCREEN
ABO/RH(D): O POS
Antibody Screen: NEGATIVE
Unit division: 0

## 2017-07-08 LAB — BPAM RBC
BLOOD PRODUCT EXPIRATION DATE: 201810142359
ISSUE DATE / TIME: 201809260823
UNIT TYPE AND RH: 5100

## 2017-07-09 DIAGNOSIS — I130 Hypertensive heart and chronic kidney disease with heart failure and stage 1 through stage 4 chronic kidney disease, or unspecified chronic kidney disease: Secondary | ICD-10-CM | POA: Diagnosis not present

## 2017-07-09 DIAGNOSIS — I5033 Acute on chronic diastolic (congestive) heart failure: Secondary | ICD-10-CM | POA: Diagnosis not present

## 2017-07-09 DIAGNOSIS — N183 Chronic kidney disease, stage 3 (moderate): Secondary | ICD-10-CM | POA: Diagnosis not present

## 2017-07-09 DIAGNOSIS — L89152 Pressure ulcer of sacral region, stage 2: Secondary | ICD-10-CM | POA: Diagnosis not present

## 2017-07-09 DIAGNOSIS — E1122 Type 2 diabetes mellitus with diabetic chronic kidney disease: Secondary | ICD-10-CM | POA: Diagnosis not present

## 2017-07-09 DIAGNOSIS — J189 Pneumonia, unspecified organism: Secondary | ICD-10-CM | POA: Diagnosis not present

## 2017-07-12 DIAGNOSIS — L89152 Pressure ulcer of sacral region, stage 2: Secondary | ICD-10-CM | POA: Diagnosis not present

## 2017-07-12 DIAGNOSIS — I130 Hypertensive heart and chronic kidney disease with heart failure and stage 1 through stage 4 chronic kidney disease, or unspecified chronic kidney disease: Secondary | ICD-10-CM | POA: Diagnosis not present

## 2017-07-12 DIAGNOSIS — N183 Chronic kidney disease, stage 3 (moderate): Secondary | ICD-10-CM | POA: Diagnosis not present

## 2017-07-12 DIAGNOSIS — E1122 Type 2 diabetes mellitus with diabetic chronic kidney disease: Secondary | ICD-10-CM | POA: Diagnosis not present

## 2017-07-12 DIAGNOSIS — I5033 Acute on chronic diastolic (congestive) heart failure: Secondary | ICD-10-CM | POA: Diagnosis not present

## 2017-07-12 DIAGNOSIS — J189 Pneumonia, unspecified organism: Secondary | ICD-10-CM | POA: Diagnosis not present

## 2017-07-13 DIAGNOSIS — I5033 Acute on chronic diastolic (congestive) heart failure: Secondary | ICD-10-CM | POA: Diagnosis not present

## 2017-07-13 DIAGNOSIS — E1122 Type 2 diabetes mellitus with diabetic chronic kidney disease: Secondary | ICD-10-CM | POA: Diagnosis not present

## 2017-07-13 DIAGNOSIS — N183 Chronic kidney disease, stage 3 (moderate): Secondary | ICD-10-CM | POA: Diagnosis not present

## 2017-07-13 DIAGNOSIS — L89152 Pressure ulcer of sacral region, stage 2: Secondary | ICD-10-CM | POA: Diagnosis not present

## 2017-07-13 DIAGNOSIS — J189 Pneumonia, unspecified organism: Secondary | ICD-10-CM | POA: Diagnosis not present

## 2017-07-13 DIAGNOSIS — I130 Hypertensive heart and chronic kidney disease with heart failure and stage 1 through stage 4 chronic kidney disease, or unspecified chronic kidney disease: Secondary | ICD-10-CM | POA: Diagnosis not present

## 2017-07-14 DIAGNOSIS — L89152 Pressure ulcer of sacral region, stage 2: Secondary | ICD-10-CM | POA: Diagnosis not present

## 2017-07-14 DIAGNOSIS — E1122 Type 2 diabetes mellitus with diabetic chronic kidney disease: Secondary | ICD-10-CM | POA: Diagnosis not present

## 2017-07-14 DIAGNOSIS — I130 Hypertensive heart and chronic kidney disease with heart failure and stage 1 through stage 4 chronic kidney disease, or unspecified chronic kidney disease: Secondary | ICD-10-CM | POA: Diagnosis not present

## 2017-07-14 DIAGNOSIS — N183 Chronic kidney disease, stage 3 (moderate): Secondary | ICD-10-CM | POA: Diagnosis not present

## 2017-07-14 DIAGNOSIS — J189 Pneumonia, unspecified organism: Secondary | ICD-10-CM | POA: Diagnosis not present

## 2017-07-14 DIAGNOSIS — I5033 Acute on chronic diastolic (congestive) heart failure: Secondary | ICD-10-CM | POA: Diagnosis not present

## 2017-07-15 DIAGNOSIS — N183 Chronic kidney disease, stage 3 (moderate): Secondary | ICD-10-CM | POA: Diagnosis not present

## 2017-07-15 DIAGNOSIS — J189 Pneumonia, unspecified organism: Secondary | ICD-10-CM | POA: Diagnosis not present

## 2017-07-15 DIAGNOSIS — L89152 Pressure ulcer of sacral region, stage 2: Secondary | ICD-10-CM | POA: Diagnosis not present

## 2017-07-15 DIAGNOSIS — I5033 Acute on chronic diastolic (congestive) heart failure: Secondary | ICD-10-CM | POA: Diagnosis not present

## 2017-07-15 DIAGNOSIS — E1122 Type 2 diabetes mellitus with diabetic chronic kidney disease: Secondary | ICD-10-CM | POA: Diagnosis not present

## 2017-07-15 DIAGNOSIS — I130 Hypertensive heart and chronic kidney disease with heart failure and stage 1 through stage 4 chronic kidney disease, or unspecified chronic kidney disease: Secondary | ICD-10-CM | POA: Diagnosis not present

## 2017-07-16 DIAGNOSIS — J189 Pneumonia, unspecified organism: Secondary | ICD-10-CM | POA: Diagnosis not present

## 2017-07-16 DIAGNOSIS — L89152 Pressure ulcer of sacral region, stage 2: Secondary | ICD-10-CM | POA: Diagnosis not present

## 2017-07-16 DIAGNOSIS — I130 Hypertensive heart and chronic kidney disease with heart failure and stage 1 through stage 4 chronic kidney disease, or unspecified chronic kidney disease: Secondary | ICD-10-CM | POA: Diagnosis not present

## 2017-07-16 DIAGNOSIS — I5033 Acute on chronic diastolic (congestive) heart failure: Secondary | ICD-10-CM | POA: Diagnosis not present

## 2017-07-16 DIAGNOSIS — N183 Chronic kidney disease, stage 3 (moderate): Secondary | ICD-10-CM | POA: Diagnosis not present

## 2017-07-16 DIAGNOSIS — E1122 Type 2 diabetes mellitus with diabetic chronic kidney disease: Secondary | ICD-10-CM | POA: Diagnosis not present

## 2017-07-19 DIAGNOSIS — E1122 Type 2 diabetes mellitus with diabetic chronic kidney disease: Secondary | ICD-10-CM | POA: Diagnosis not present

## 2017-07-19 DIAGNOSIS — N183 Chronic kidney disease, stage 3 (moderate): Secondary | ICD-10-CM | POA: Diagnosis not present

## 2017-07-19 DIAGNOSIS — L89152 Pressure ulcer of sacral region, stage 2: Secondary | ICD-10-CM | POA: Diagnosis not present

## 2017-07-19 DIAGNOSIS — I5033 Acute on chronic diastolic (congestive) heart failure: Secondary | ICD-10-CM | POA: Diagnosis not present

## 2017-07-19 DIAGNOSIS — I130 Hypertensive heart and chronic kidney disease with heart failure and stage 1 through stage 4 chronic kidney disease, or unspecified chronic kidney disease: Secondary | ICD-10-CM | POA: Diagnosis not present

## 2017-07-19 DIAGNOSIS — J189 Pneumonia, unspecified organism: Secondary | ICD-10-CM | POA: Diagnosis not present

## 2017-07-20 DIAGNOSIS — I130 Hypertensive heart and chronic kidney disease with heart failure and stage 1 through stage 4 chronic kidney disease, or unspecified chronic kidney disease: Secondary | ICD-10-CM | POA: Diagnosis not present

## 2017-07-20 DIAGNOSIS — I5033 Acute on chronic diastolic (congestive) heart failure: Secondary | ICD-10-CM | POA: Diagnosis not present

## 2017-07-20 DIAGNOSIS — L89152 Pressure ulcer of sacral region, stage 2: Secondary | ICD-10-CM | POA: Diagnosis not present

## 2017-07-20 DIAGNOSIS — N183 Chronic kidney disease, stage 3 (moderate): Secondary | ICD-10-CM | POA: Diagnosis not present

## 2017-07-20 DIAGNOSIS — J189 Pneumonia, unspecified organism: Secondary | ICD-10-CM | POA: Diagnosis not present

## 2017-07-20 DIAGNOSIS — E1122 Type 2 diabetes mellitus with diabetic chronic kidney disease: Secondary | ICD-10-CM | POA: Diagnosis not present

## 2017-07-21 DIAGNOSIS — L89152 Pressure ulcer of sacral region, stage 2: Secondary | ICD-10-CM | POA: Diagnosis not present

## 2017-07-21 DIAGNOSIS — I130 Hypertensive heart and chronic kidney disease with heart failure and stage 1 through stage 4 chronic kidney disease, or unspecified chronic kidney disease: Secondary | ICD-10-CM | POA: Diagnosis not present

## 2017-07-21 DIAGNOSIS — R69 Illness, unspecified: Secondary | ICD-10-CM | POA: Diagnosis not present

## 2017-07-21 DIAGNOSIS — I5033 Acute on chronic diastolic (congestive) heart failure: Secondary | ICD-10-CM | POA: Diagnosis not present

## 2017-07-21 DIAGNOSIS — N183 Chronic kidney disease, stage 3 (moderate): Secondary | ICD-10-CM | POA: Diagnosis not present

## 2017-07-21 DIAGNOSIS — E1122 Type 2 diabetes mellitus with diabetic chronic kidney disease: Secondary | ICD-10-CM | POA: Diagnosis not present

## 2017-07-21 DIAGNOSIS — J189 Pneumonia, unspecified organism: Secondary | ICD-10-CM | POA: Diagnosis not present

## 2017-07-23 DIAGNOSIS — E1122 Type 2 diabetes mellitus with diabetic chronic kidney disease: Secondary | ICD-10-CM | POA: Diagnosis not present

## 2017-07-23 DIAGNOSIS — L89152 Pressure ulcer of sacral region, stage 2: Secondary | ICD-10-CM | POA: Diagnosis not present

## 2017-07-23 DIAGNOSIS — I130 Hypertensive heart and chronic kidney disease with heart failure and stage 1 through stage 4 chronic kidney disease, or unspecified chronic kidney disease: Secondary | ICD-10-CM | POA: Diagnosis not present

## 2017-07-23 DIAGNOSIS — I5033 Acute on chronic diastolic (congestive) heart failure: Secondary | ICD-10-CM | POA: Diagnosis not present

## 2017-07-23 DIAGNOSIS — J189 Pneumonia, unspecified organism: Secondary | ICD-10-CM | POA: Diagnosis not present

## 2017-07-23 DIAGNOSIS — N183 Chronic kidney disease, stage 3 (moderate): Secondary | ICD-10-CM | POA: Diagnosis not present

## 2017-07-26 DIAGNOSIS — N183 Chronic kidney disease, stage 3 (moderate): Secondary | ICD-10-CM | POA: Diagnosis not present

## 2017-07-26 DIAGNOSIS — L89152 Pressure ulcer of sacral region, stage 2: Secondary | ICD-10-CM | POA: Diagnosis not present

## 2017-07-26 DIAGNOSIS — J189 Pneumonia, unspecified organism: Secondary | ICD-10-CM | POA: Diagnosis not present

## 2017-07-26 DIAGNOSIS — E1122 Type 2 diabetes mellitus with diabetic chronic kidney disease: Secondary | ICD-10-CM | POA: Diagnosis not present

## 2017-07-26 DIAGNOSIS — I5033 Acute on chronic diastolic (congestive) heart failure: Secondary | ICD-10-CM | POA: Diagnosis not present

## 2017-07-26 DIAGNOSIS — I130 Hypertensive heart and chronic kidney disease with heart failure and stage 1 through stage 4 chronic kidney disease, or unspecified chronic kidney disease: Secondary | ICD-10-CM | POA: Diagnosis not present

## 2017-07-27 DIAGNOSIS — L89152 Pressure ulcer of sacral region, stage 2: Secondary | ICD-10-CM | POA: Diagnosis not present

## 2017-07-27 DIAGNOSIS — E1122 Type 2 diabetes mellitus with diabetic chronic kidney disease: Secondary | ICD-10-CM | POA: Diagnosis not present

## 2017-07-27 DIAGNOSIS — N183 Chronic kidney disease, stage 3 (moderate): Secondary | ICD-10-CM | POA: Diagnosis not present

## 2017-07-27 DIAGNOSIS — J189 Pneumonia, unspecified organism: Secondary | ICD-10-CM | POA: Diagnosis not present

## 2017-07-27 DIAGNOSIS — I130 Hypertensive heart and chronic kidney disease with heart failure and stage 1 through stage 4 chronic kidney disease, or unspecified chronic kidney disease: Secondary | ICD-10-CM | POA: Diagnosis not present

## 2017-07-27 DIAGNOSIS — I5033 Acute on chronic diastolic (congestive) heart failure: Secondary | ICD-10-CM | POA: Diagnosis not present

## 2017-07-28 DIAGNOSIS — I5033 Acute on chronic diastolic (congestive) heart failure: Secondary | ICD-10-CM | POA: Diagnosis not present

## 2017-07-28 DIAGNOSIS — E1122 Type 2 diabetes mellitus with diabetic chronic kidney disease: Secondary | ICD-10-CM | POA: Diagnosis not present

## 2017-07-28 DIAGNOSIS — J189 Pneumonia, unspecified organism: Secondary | ICD-10-CM | POA: Diagnosis not present

## 2017-07-28 DIAGNOSIS — N183 Chronic kidney disease, stage 3 (moderate): Secondary | ICD-10-CM | POA: Diagnosis not present

## 2017-07-28 DIAGNOSIS — L89152 Pressure ulcer of sacral region, stage 2: Secondary | ICD-10-CM | POA: Diagnosis not present

## 2017-07-28 DIAGNOSIS — I130 Hypertensive heart and chronic kidney disease with heart failure and stage 1 through stage 4 chronic kidney disease, or unspecified chronic kidney disease: Secondary | ICD-10-CM | POA: Diagnosis not present

## 2017-07-29 DIAGNOSIS — N183 Chronic kidney disease, stage 3 (moderate): Secondary | ICD-10-CM | POA: Diagnosis not present

## 2017-07-29 DIAGNOSIS — I5033 Acute on chronic diastolic (congestive) heart failure: Secondary | ICD-10-CM | POA: Diagnosis not present

## 2017-07-29 DIAGNOSIS — L89152 Pressure ulcer of sacral region, stage 2: Secondary | ICD-10-CM | POA: Diagnosis not present

## 2017-07-29 DIAGNOSIS — E1122 Type 2 diabetes mellitus with diabetic chronic kidney disease: Secondary | ICD-10-CM | POA: Diagnosis not present

## 2017-07-29 DIAGNOSIS — I130 Hypertensive heart and chronic kidney disease with heart failure and stage 1 through stage 4 chronic kidney disease, or unspecified chronic kidney disease: Secondary | ICD-10-CM | POA: Diagnosis not present

## 2017-07-29 DIAGNOSIS — J189 Pneumonia, unspecified organism: Secondary | ICD-10-CM | POA: Diagnosis not present

## 2017-07-30 DIAGNOSIS — I130 Hypertensive heart and chronic kidney disease with heart failure and stage 1 through stage 4 chronic kidney disease, or unspecified chronic kidney disease: Secondary | ICD-10-CM | POA: Diagnosis not present

## 2017-07-30 DIAGNOSIS — I5033 Acute on chronic diastolic (congestive) heart failure: Secondary | ICD-10-CM | POA: Diagnosis not present

## 2017-07-30 DIAGNOSIS — N183 Chronic kidney disease, stage 3 (moderate): Secondary | ICD-10-CM | POA: Diagnosis not present

## 2017-07-30 DIAGNOSIS — E1122 Type 2 diabetes mellitus with diabetic chronic kidney disease: Secondary | ICD-10-CM | POA: Diagnosis not present

## 2017-07-30 DIAGNOSIS — L89152 Pressure ulcer of sacral region, stage 2: Secondary | ICD-10-CM | POA: Diagnosis not present

## 2017-07-30 DIAGNOSIS — J189 Pneumonia, unspecified organism: Secondary | ICD-10-CM | POA: Diagnosis not present

## 2017-08-02 DIAGNOSIS — J189 Pneumonia, unspecified organism: Secondary | ICD-10-CM | POA: Diagnosis not present

## 2017-08-02 DIAGNOSIS — N390 Urinary tract infection, site not specified: Secondary | ICD-10-CM | POA: Diagnosis not present

## 2017-08-02 DIAGNOSIS — I5033 Acute on chronic diastolic (congestive) heart failure: Secondary | ICD-10-CM | POA: Diagnosis not present

## 2017-08-02 DIAGNOSIS — E1122 Type 2 diabetes mellitus with diabetic chronic kidney disease: Secondary | ICD-10-CM | POA: Diagnosis not present

## 2017-08-02 DIAGNOSIS — L89152 Pressure ulcer of sacral region, stage 2: Secondary | ICD-10-CM | POA: Diagnosis not present

## 2017-08-02 DIAGNOSIS — N183 Chronic kidney disease, stage 3 (moderate): Secondary | ICD-10-CM | POA: Diagnosis not present

## 2017-08-02 DIAGNOSIS — I130 Hypertensive heart and chronic kidney disease with heart failure and stage 1 through stage 4 chronic kidney disease, or unspecified chronic kidney disease: Secondary | ICD-10-CM | POA: Diagnosis not present

## 2017-08-03 DIAGNOSIS — E1122 Type 2 diabetes mellitus with diabetic chronic kidney disease: Secondary | ICD-10-CM | POA: Diagnosis not present

## 2017-08-03 DIAGNOSIS — L89152 Pressure ulcer of sacral region, stage 2: Secondary | ICD-10-CM | POA: Diagnosis not present

## 2017-08-03 DIAGNOSIS — I5033 Acute on chronic diastolic (congestive) heart failure: Secondary | ICD-10-CM | POA: Diagnosis not present

## 2017-08-03 DIAGNOSIS — N183 Chronic kidney disease, stage 3 (moderate): Secondary | ICD-10-CM | POA: Diagnosis not present

## 2017-08-03 DIAGNOSIS — I130 Hypertensive heart and chronic kidney disease with heart failure and stage 1 through stage 4 chronic kidney disease, or unspecified chronic kidney disease: Secondary | ICD-10-CM | POA: Diagnosis not present

## 2017-08-03 DIAGNOSIS — J189 Pneumonia, unspecified organism: Secondary | ICD-10-CM | POA: Diagnosis not present

## 2017-08-04 DIAGNOSIS — E119 Type 2 diabetes mellitus without complications: Secondary | ICD-10-CM | POA: Diagnosis not present

## 2017-08-04 DIAGNOSIS — I5033 Acute on chronic diastolic (congestive) heart failure: Secondary | ICD-10-CM | POA: Diagnosis not present

## 2017-08-04 DIAGNOSIS — I10 Essential (primary) hypertension: Secondary | ICD-10-CM | POA: Diagnosis not present

## 2017-08-04 DIAGNOSIS — N390 Urinary tract infection, site not specified: Secondary | ICD-10-CM | POA: Diagnosis not present

## 2017-08-04 DIAGNOSIS — E1122 Type 2 diabetes mellitus with diabetic chronic kidney disease: Secondary | ICD-10-CM | POA: Diagnosis not present

## 2017-08-04 DIAGNOSIS — J189 Pneumonia, unspecified organism: Secondary | ICD-10-CM | POA: Diagnosis not present

## 2017-08-04 DIAGNOSIS — S32130A Nondisplaced Zone III fracture of sacrum, initial encounter for closed fracture: Secondary | ICD-10-CM | POA: Diagnosis not present

## 2017-08-04 DIAGNOSIS — I130 Hypertensive heart and chronic kidney disease with heart failure and stage 1 through stage 4 chronic kidney disease, or unspecified chronic kidney disease: Secondary | ICD-10-CM | POA: Diagnosis not present

## 2017-08-04 DIAGNOSIS — J9611 Chronic respiratory failure with hypoxia: Secondary | ICD-10-CM | POA: Diagnosis not present

## 2017-08-04 DIAGNOSIS — I509 Heart failure, unspecified: Secondary | ICD-10-CM | POA: Diagnosis not present

## 2017-08-04 DIAGNOSIS — R600 Localized edema: Secondary | ICD-10-CM | POA: Diagnosis not present

## 2017-08-04 DIAGNOSIS — D649 Anemia, unspecified: Secondary | ICD-10-CM | POA: Diagnosis not present

## 2017-08-04 DIAGNOSIS — L89152 Pressure ulcer of sacral region, stage 2: Secondary | ICD-10-CM | POA: Diagnosis not present

## 2017-08-04 DIAGNOSIS — N183 Chronic kidney disease, stage 3 (moderate): Secondary | ICD-10-CM | POA: Diagnosis not present

## 2017-08-05 DIAGNOSIS — E1122 Type 2 diabetes mellitus with diabetic chronic kidney disease: Secondary | ICD-10-CM | POA: Diagnosis not present

## 2017-08-05 DIAGNOSIS — L89152 Pressure ulcer of sacral region, stage 2: Secondary | ICD-10-CM | POA: Diagnosis not present

## 2017-08-05 DIAGNOSIS — I130 Hypertensive heart and chronic kidney disease with heart failure and stage 1 through stage 4 chronic kidney disease, or unspecified chronic kidney disease: Secondary | ICD-10-CM | POA: Diagnosis not present

## 2017-08-05 DIAGNOSIS — I5033 Acute on chronic diastolic (congestive) heart failure: Secondary | ICD-10-CM | POA: Diagnosis not present

## 2017-08-05 DIAGNOSIS — N183 Chronic kidney disease, stage 3 (moderate): Secondary | ICD-10-CM | POA: Diagnosis not present

## 2017-08-05 DIAGNOSIS — J189 Pneumonia, unspecified organism: Secondary | ICD-10-CM | POA: Diagnosis not present

## 2017-08-09 DIAGNOSIS — I130 Hypertensive heart and chronic kidney disease with heart failure and stage 1 through stage 4 chronic kidney disease, or unspecified chronic kidney disease: Secondary | ICD-10-CM | POA: Diagnosis not present

## 2017-08-09 DIAGNOSIS — J189 Pneumonia, unspecified organism: Secondary | ICD-10-CM | POA: Diagnosis not present

## 2017-08-09 DIAGNOSIS — N183 Chronic kidney disease, stage 3 (moderate): Secondary | ICD-10-CM | POA: Diagnosis not present

## 2017-08-09 DIAGNOSIS — I5033 Acute on chronic diastolic (congestive) heart failure: Secondary | ICD-10-CM | POA: Diagnosis not present

## 2017-08-09 DIAGNOSIS — E1122 Type 2 diabetes mellitus with diabetic chronic kidney disease: Secondary | ICD-10-CM | POA: Diagnosis not present

## 2017-08-09 DIAGNOSIS — L89152 Pressure ulcer of sacral region, stage 2: Secondary | ICD-10-CM | POA: Diagnosis not present

## 2017-08-10 DIAGNOSIS — N183 Chronic kidney disease, stage 3 (moderate): Secondary | ICD-10-CM | POA: Diagnosis not present

## 2017-08-10 DIAGNOSIS — L89152 Pressure ulcer of sacral region, stage 2: Secondary | ICD-10-CM | POA: Diagnosis not present

## 2017-08-10 DIAGNOSIS — J189 Pneumonia, unspecified organism: Secondary | ICD-10-CM | POA: Diagnosis not present

## 2017-08-10 DIAGNOSIS — I130 Hypertensive heart and chronic kidney disease with heart failure and stage 1 through stage 4 chronic kidney disease, or unspecified chronic kidney disease: Secondary | ICD-10-CM | POA: Diagnosis not present

## 2017-08-10 DIAGNOSIS — E1122 Type 2 diabetes mellitus with diabetic chronic kidney disease: Secondary | ICD-10-CM | POA: Diagnosis not present

## 2017-08-10 DIAGNOSIS — I5033 Acute on chronic diastolic (congestive) heart failure: Secondary | ICD-10-CM | POA: Diagnosis not present

## 2017-08-12 DIAGNOSIS — I130 Hypertensive heart and chronic kidney disease with heart failure and stage 1 through stage 4 chronic kidney disease, or unspecified chronic kidney disease: Secondary | ICD-10-CM | POA: Diagnosis not present

## 2017-08-12 DIAGNOSIS — I5033 Acute on chronic diastolic (congestive) heart failure: Secondary | ICD-10-CM | POA: Diagnosis not present

## 2017-08-12 DIAGNOSIS — J189 Pneumonia, unspecified organism: Secondary | ICD-10-CM | POA: Diagnosis not present

## 2017-08-12 DIAGNOSIS — E1122 Type 2 diabetes mellitus with diabetic chronic kidney disease: Secondary | ICD-10-CM | POA: Diagnosis not present

## 2017-08-12 DIAGNOSIS — N183 Chronic kidney disease, stage 3 (moderate): Secondary | ICD-10-CM | POA: Diagnosis not present

## 2017-08-12 DIAGNOSIS — L89152 Pressure ulcer of sacral region, stage 2: Secondary | ICD-10-CM | POA: Diagnosis not present

## 2017-08-13 DIAGNOSIS — I5033 Acute on chronic diastolic (congestive) heart failure: Secondary | ICD-10-CM | POA: Diagnosis not present

## 2017-08-13 DIAGNOSIS — J189 Pneumonia, unspecified organism: Secondary | ICD-10-CM | POA: Diagnosis not present

## 2017-08-13 DIAGNOSIS — N183 Chronic kidney disease, stage 3 (moderate): Secondary | ICD-10-CM | POA: Diagnosis not present

## 2017-08-13 DIAGNOSIS — L89152 Pressure ulcer of sacral region, stage 2: Secondary | ICD-10-CM | POA: Diagnosis not present

## 2017-08-13 DIAGNOSIS — I130 Hypertensive heart and chronic kidney disease with heart failure and stage 1 through stage 4 chronic kidney disease, or unspecified chronic kidney disease: Secondary | ICD-10-CM | POA: Diagnosis not present

## 2017-08-13 DIAGNOSIS — E1122 Type 2 diabetes mellitus with diabetic chronic kidney disease: Secondary | ICD-10-CM | POA: Diagnosis not present

## 2017-08-17 DIAGNOSIS — L89152 Pressure ulcer of sacral region, stage 2: Secondary | ICD-10-CM | POA: Diagnosis not present

## 2017-08-17 DIAGNOSIS — I5033 Acute on chronic diastolic (congestive) heart failure: Secondary | ICD-10-CM | POA: Diagnosis not present

## 2017-08-17 DIAGNOSIS — I130 Hypertensive heart and chronic kidney disease with heart failure and stage 1 through stage 4 chronic kidney disease, or unspecified chronic kidney disease: Secondary | ICD-10-CM | POA: Diagnosis not present

## 2017-08-17 DIAGNOSIS — J189 Pneumonia, unspecified organism: Secondary | ICD-10-CM | POA: Diagnosis not present

## 2017-08-17 DIAGNOSIS — E1122 Type 2 diabetes mellitus with diabetic chronic kidney disease: Secondary | ICD-10-CM | POA: Diagnosis not present

## 2017-08-17 DIAGNOSIS — N183 Chronic kidney disease, stage 3 (moderate): Secondary | ICD-10-CM | POA: Diagnosis not present

## 2017-08-18 DIAGNOSIS — N390 Urinary tract infection, site not specified: Secondary | ICD-10-CM | POA: Diagnosis not present

## 2017-08-18 DIAGNOSIS — Z7982 Long term (current) use of aspirin: Secondary | ICD-10-CM | POA: Diagnosis not present

## 2017-08-18 DIAGNOSIS — G8929 Other chronic pain: Secondary | ICD-10-CM | POA: Diagnosis not present

## 2017-08-18 DIAGNOSIS — I69351 Hemiplegia and hemiparesis following cerebral infarction affecting right dominant side: Secondary | ICD-10-CM | POA: Diagnosis not present

## 2017-08-18 DIAGNOSIS — M549 Dorsalgia, unspecified: Secondary | ICD-10-CM | POA: Diagnosis not present

## 2017-08-18 DIAGNOSIS — Z9981 Dependence on supplemental oxygen: Secondary | ICD-10-CM | POA: Diagnosis not present

## 2017-08-18 DIAGNOSIS — N3000 Acute cystitis without hematuria: Secondary | ICD-10-CM | POA: Diagnosis not present

## 2017-08-18 DIAGNOSIS — Z794 Long term (current) use of insulin: Secondary | ICD-10-CM | POA: Diagnosis not present

## 2017-08-18 DIAGNOSIS — I5033 Acute on chronic diastolic (congestive) heart failure: Secondary | ICD-10-CM | POA: Diagnosis not present

## 2017-08-18 DIAGNOSIS — Z5181 Encounter for therapeutic drug level monitoring: Secondary | ICD-10-CM | POA: Diagnosis not present

## 2017-08-18 DIAGNOSIS — L989 Disorder of the skin and subcutaneous tissue, unspecified: Secondary | ICD-10-CM | POA: Diagnosis not present

## 2017-08-18 DIAGNOSIS — J9611 Chronic respiratory failure with hypoxia: Secondary | ICD-10-CM | POA: Diagnosis not present

## 2017-08-18 DIAGNOSIS — E1142 Type 2 diabetes mellitus with diabetic polyneuropathy: Secondary | ICD-10-CM | POA: Diagnosis not present

## 2017-08-18 DIAGNOSIS — I130 Hypertensive heart and chronic kidney disease with heart failure and stage 1 through stage 4 chronic kidney disease, or unspecified chronic kidney disease: Secondary | ICD-10-CM | POA: Diagnosis not present

## 2017-08-18 DIAGNOSIS — E1122 Type 2 diabetes mellitus with diabetic chronic kidney disease: Secondary | ICD-10-CM | POA: Diagnosis not present

## 2017-08-18 DIAGNOSIS — N183 Chronic kidney disease, stage 3 (moderate): Secondary | ICD-10-CM | POA: Diagnosis not present

## 2017-08-25 DIAGNOSIS — I5033 Acute on chronic diastolic (congestive) heart failure: Secondary | ICD-10-CM | POA: Diagnosis not present

## 2017-08-25 DIAGNOSIS — L989 Disorder of the skin and subcutaneous tissue, unspecified: Secondary | ICD-10-CM | POA: Diagnosis not present

## 2017-08-25 DIAGNOSIS — I130 Hypertensive heart and chronic kidney disease with heart failure and stage 1 through stage 4 chronic kidney disease, or unspecified chronic kidney disease: Secondary | ICD-10-CM | POA: Diagnosis not present

## 2017-08-25 DIAGNOSIS — J9611 Chronic respiratory failure with hypoxia: Secondary | ICD-10-CM | POA: Diagnosis not present

## 2017-08-25 DIAGNOSIS — N183 Chronic kidney disease, stage 3 (moderate): Secondary | ICD-10-CM | POA: Diagnosis not present

## 2017-08-25 DIAGNOSIS — E1122 Type 2 diabetes mellitus with diabetic chronic kidney disease: Secondary | ICD-10-CM | POA: Diagnosis not present

## 2017-08-28 ENCOUNTER — Other Ambulatory Visit: Payer: Self-pay

## 2017-08-28 ENCOUNTER — Encounter (HOSPITAL_COMMUNITY): Payer: Self-pay

## 2017-08-28 ENCOUNTER — Emergency Department (HOSPITAL_COMMUNITY): Payer: Medicare Other

## 2017-08-28 ENCOUNTER — Inpatient Hospital Stay (HOSPITAL_COMMUNITY)
Admission: EM | Admit: 2017-08-28 | Discharge: 2017-09-04 | DRG: 280 | Disposition: A | Discharge status: Skilled Nursing Facility | Payer: Medicare Other | Attending: Internal Medicine | Admitting: Internal Medicine

## 2017-08-28 DIAGNOSIS — S37009A Unspecified injury of unspecified kidney, initial encounter: Secondary | ICD-10-CM | POA: Diagnosis not present

## 2017-08-28 DIAGNOSIS — E876 Hypokalemia: Secondary | ICD-10-CM | POA: Diagnosis not present

## 2017-08-28 DIAGNOSIS — J90 Pleural effusion, not elsewhere classified: Secondary | ICD-10-CM | POA: Diagnosis not present

## 2017-08-28 DIAGNOSIS — L97929 Non-pressure chronic ulcer of unspecified part of left lower leg with unspecified severity: Secondary | ICD-10-CM | POA: Diagnosis present

## 2017-08-28 DIAGNOSIS — I5033 Acute on chronic diastolic (congestive) heart failure: Secondary | ICD-10-CM | POA: Diagnosis present

## 2017-08-28 DIAGNOSIS — I340 Nonrheumatic mitral (valve) insufficiency: Secondary | ICD-10-CM | POA: Diagnosis not present

## 2017-08-28 DIAGNOSIS — L89629 Pressure ulcer of left heel, unspecified stage: Secondary | ICD-10-CM | POA: Diagnosis present

## 2017-08-28 DIAGNOSIS — D509 Iron deficiency anemia, unspecified: Secondary | ICD-10-CM | POA: Diagnosis present

## 2017-08-28 DIAGNOSIS — I639 Cerebral infarction, unspecified: Secondary | ICD-10-CM | POA: Diagnosis not present

## 2017-08-28 DIAGNOSIS — E1122 Type 2 diabetes mellitus with diabetic chronic kidney disease: Secondary | ICD-10-CM | POA: Diagnosis present

## 2017-08-28 DIAGNOSIS — N189 Chronic kidney disease, unspecified: Secondary | ICD-10-CM | POA: Diagnosis not present

## 2017-08-28 DIAGNOSIS — R4182 Altered mental status, unspecified: Secondary | ICD-10-CM | POA: Diagnosis not present

## 2017-08-28 DIAGNOSIS — N9989 Other postprocedural complications and disorders of genitourinary system: Secondary | ICD-10-CM | POA: Diagnosis not present

## 2017-08-28 DIAGNOSIS — Z6831 Body mass index (BMI) 31.0-31.9, adult: Secondary | ICD-10-CM

## 2017-08-28 DIAGNOSIS — R509 Fever, unspecified: Secondary | ICD-10-CM | POA: Diagnosis not present

## 2017-08-28 DIAGNOSIS — E785 Hyperlipidemia, unspecified: Secondary | ICD-10-CM | POA: Diagnosis present

## 2017-08-28 DIAGNOSIS — N390 Urinary tract infection, site not specified: Secondary | ICD-10-CM | POA: Diagnosis present

## 2017-08-28 DIAGNOSIS — E1121 Type 2 diabetes mellitus with diabetic nephropathy: Secondary | ICD-10-CM | POA: Diagnosis present

## 2017-08-28 DIAGNOSIS — Z885 Allergy status to narcotic agent status: Secondary | ICD-10-CM

## 2017-08-28 DIAGNOSIS — I509 Heart failure, unspecified: Secondary | ICD-10-CM | POA: Diagnosis not present

## 2017-08-28 DIAGNOSIS — R4020 Unspecified coma: Secondary | ICD-10-CM | POA: Diagnosis not present

## 2017-08-28 DIAGNOSIS — R404 Transient alteration of awareness: Secondary | ICD-10-CM | POA: Diagnosis not present

## 2017-08-28 DIAGNOSIS — B9620 Unspecified Escherichia coli [E. coli] as the cause of diseases classified elsewhere: Secondary | ICD-10-CM | POA: Diagnosis present

## 2017-08-28 DIAGNOSIS — Z87891 Personal history of nicotine dependence: Secondary | ICD-10-CM

## 2017-08-28 DIAGNOSIS — I214 Non-ST elevation (NSTEMI) myocardial infarction: Secondary | ICD-10-CM | POA: Diagnosis not present

## 2017-08-28 DIAGNOSIS — E669 Obesity, unspecified: Secondary | ICD-10-CM | POA: Diagnosis present

## 2017-08-28 DIAGNOSIS — Z880 Allergy status to penicillin: Secondary | ICD-10-CM

## 2017-08-28 DIAGNOSIS — R061 Stridor: Secondary | ICD-10-CM | POA: Diagnosis not present

## 2017-08-28 DIAGNOSIS — L409 Psoriasis, unspecified: Secondary | ICD-10-CM | POA: Diagnosis present

## 2017-08-28 DIAGNOSIS — I63523 Cerebral infarction due to unspecified occlusion or stenosis of bilateral anterior cerebral arteries: Secondary | ICD-10-CM

## 2017-08-28 DIAGNOSIS — D638 Anemia in other chronic diseases classified elsewhere: Secondary | ICD-10-CM | POA: Diagnosis present

## 2017-08-28 DIAGNOSIS — M6281 Muscle weakness (generalized): Secondary | ICD-10-CM | POA: Diagnosis not present

## 2017-08-28 DIAGNOSIS — J9621 Acute and chronic respiratory failure with hypoxia: Secondary | ICD-10-CM | POA: Diagnosis not present

## 2017-08-28 DIAGNOSIS — N3000 Acute cystitis without hematuria: Secondary | ICD-10-CM | POA: Diagnosis not present

## 2017-08-28 DIAGNOSIS — Z7982 Long term (current) use of aspirin: Secondary | ICD-10-CM

## 2017-08-28 DIAGNOSIS — G8191 Hemiplegia, unspecified affecting right dominant side: Secondary | ICD-10-CM | POA: Diagnosis present

## 2017-08-28 DIAGNOSIS — R748 Abnormal levels of other serum enzymes: Secondary | ICD-10-CM | POA: Diagnosis not present

## 2017-08-28 DIAGNOSIS — R0602 Shortness of breath: Secondary | ICD-10-CM | POA: Diagnosis not present

## 2017-08-28 DIAGNOSIS — R7989 Other specified abnormal findings of blood chemistry: Secondary | ICD-10-CM

## 2017-08-28 DIAGNOSIS — I10 Essential (primary) hypertension: Secondary | ICD-10-CM | POA: Diagnosis not present

## 2017-08-28 DIAGNOSIS — A419 Sepsis, unspecified organism: Secondary | ICD-10-CM | POA: Diagnosis not present

## 2017-08-28 DIAGNOSIS — Z91013 Allergy to seafood: Secondary | ICD-10-CM

## 2017-08-28 DIAGNOSIS — D649 Anemia, unspecified: Secondary | ICD-10-CM | POA: Diagnosis not present

## 2017-08-28 DIAGNOSIS — N184 Chronic kidney disease, stage 4 (severe): Secondary | ICD-10-CM | POA: Diagnosis not present

## 2017-08-28 DIAGNOSIS — Z888 Allergy status to other drugs, medicaments and biological substances status: Secondary | ICD-10-CM

## 2017-08-28 DIAGNOSIS — T501X5A Adverse effect of loop [high-ceiling] diuretics, initial encounter: Secondary | ICD-10-CM | POA: Diagnosis not present

## 2017-08-28 DIAGNOSIS — G936 Cerebral edema: Secondary | ICD-10-CM | POA: Diagnosis present

## 2017-08-28 DIAGNOSIS — L97909 Non-pressure chronic ulcer of unspecified part of unspecified lower leg with unspecified severity: Secondary | ICD-10-CM | POA: Diagnosis not present

## 2017-08-28 DIAGNOSIS — E1165 Type 2 diabetes mellitus with hyperglycemia: Secondary | ICD-10-CM | POA: Diagnosis not present

## 2017-08-28 DIAGNOSIS — N179 Acute kidney failure, unspecified: Secondary | ICD-10-CM | POA: Diagnosis present

## 2017-08-28 DIAGNOSIS — G9340 Encephalopathy, unspecified: Secondary | ICD-10-CM | POA: Diagnosis not present

## 2017-08-28 DIAGNOSIS — E519 Thiamine deficiency, unspecified: Secondary | ICD-10-CM

## 2017-08-28 DIAGNOSIS — E11622 Type 2 diabetes mellitus with other skin ulcer: Secondary | ICD-10-CM

## 2017-08-28 DIAGNOSIS — Z9981 Dependence on supplemental oxygen: Secondary | ICD-10-CM | POA: Diagnosis not present

## 2017-08-28 DIAGNOSIS — Z79899 Other long term (current) drug therapy: Secondary | ICD-10-CM

## 2017-08-28 DIAGNOSIS — G9341 Metabolic encephalopathy: Secondary | ICD-10-CM | POA: Diagnosis present

## 2017-08-28 DIAGNOSIS — D631 Anemia in chronic kidney disease: Secondary | ICD-10-CM | POA: Diagnosis not present

## 2017-08-28 DIAGNOSIS — Z882 Allergy status to sulfonamides status: Secondary | ICD-10-CM

## 2017-08-28 DIAGNOSIS — IMO0002 Reserved for concepts with insufficient information to code with codable children: Secondary | ICD-10-CM | POA: Diagnosis present

## 2017-08-28 DIAGNOSIS — I130 Hypertensive heart and chronic kidney disease with heart failure and stage 1 through stage 4 chronic kidney disease, or unspecified chronic kidney disease: Principal | ICD-10-CM | POA: Diagnosis present

## 2017-08-28 DIAGNOSIS — Z91048 Other nonmedicinal substance allergy status: Secondary | ICD-10-CM

## 2017-08-28 DIAGNOSIS — E860 Dehydration: Secondary | ICD-10-CM | POA: Diagnosis not present

## 2017-08-28 DIAGNOSIS — Z794 Long term (current) use of insulin: Secondary | ICD-10-CM

## 2017-08-28 DIAGNOSIS — R778 Other specified abnormalities of plasma proteins: Secondary | ICD-10-CM

## 2017-08-28 DIAGNOSIS — Z7984 Long term (current) use of oral hypoglycemic drugs: Secondary | ICD-10-CM

## 2017-08-28 HISTORY — DX: Unspecified diastolic (congestive) heart failure: I50.30

## 2017-08-28 HISTORY — DX: Thiamine deficiency, unspecified: E51.9

## 2017-08-28 LAB — URINALYSIS, ROUTINE W REFLEX MICROSCOPIC
BILIRUBIN URINE: NEGATIVE
GLUCOSE, UA: NEGATIVE mg/dL
KETONES UR: NEGATIVE mg/dL
NITRITE: NEGATIVE
Protein, ur: 100 mg/dL — AB
Specific Gravity, Urine: 1.009 (ref 1.005–1.030)
Squamous Epithelial / LPF: NONE SEEN
pH: 6 (ref 5.0–8.0)

## 2017-08-28 LAB — CBC WITH DIFFERENTIAL/PLATELET
BASOS ABS: 0 10*3/uL (ref 0.0–0.1)
BASOS PCT: 1 %
EOS PCT: 0 %
Eosinophils Absolute: 0 10*3/uL (ref 0.0–0.7)
HEMATOCRIT: 26.8 % — AB (ref 36.0–46.0)
Hemoglobin: 7.8 g/dL — ABNORMAL LOW (ref 12.0–15.0)
LYMPHS PCT: 21 %
Lymphs Abs: 0.9 10*3/uL (ref 0.7–4.0)
MCH: 24.8 pg — AB (ref 26.0–34.0)
MCHC: 29.1 g/dL — AB (ref 30.0–36.0)
MCV: 85.4 fL (ref 78.0–100.0)
MONO ABS: 0.3 10*3/uL (ref 0.1–1.0)
MONOS PCT: 8 %
Neutro Abs: 2.9 10*3/uL (ref 1.7–7.7)
Neutrophils Relative %: 71 %
Platelets: 155 10*3/uL (ref 150–400)
RBC: 3.14 MIL/uL — ABNORMAL LOW (ref 3.87–5.11)
RDW: 16.6 % — AB (ref 11.5–15.5)
WBC: 4.1 10*3/uL (ref 4.0–10.5)

## 2017-08-28 LAB — BLOOD GAS, ARTERIAL
Acid-Base Excess: 1.3 mmol/L (ref 0.0–2.0)
BICARBONATE: 25.5 mmol/L (ref 20.0–28.0)
DRAWN BY: 221791
O2 Content: 4 L/min
O2 Saturation: 94.7 %
PH ART: 7.414 (ref 7.350–7.450)
pCO2 arterial: 40.4 mmHg (ref 32.0–48.0)
pO2, Arterial: 80.6 mmHg — ABNORMAL LOW (ref 83.0–108.0)

## 2017-08-28 LAB — AMMONIA: Ammonia: 17 umol/L (ref 9–35)

## 2017-08-28 LAB — COMPREHENSIVE METABOLIC PANEL
ALBUMIN: 2.7 g/dL — AB (ref 3.5–5.0)
ALK PHOS: 35 U/L — AB (ref 38–126)
ALT: 14 U/L (ref 14–54)
AST: 41 U/L (ref 15–41)
Anion gap: 8 (ref 5–15)
BILIRUBIN TOTAL: 1.2 mg/dL (ref 0.3–1.2)
BUN: 42 mg/dL — AB (ref 6–20)
CALCIUM: 8.3 mg/dL — AB (ref 8.9–10.3)
CO2: 26 mmol/L (ref 22–32)
Chloride: 103 mmol/L (ref 101–111)
Creatinine, Ser: 2.98 mg/dL — ABNORMAL HIGH (ref 0.44–1.00)
GFR calc Af Amer: 18 mL/min — ABNORMAL LOW (ref >60)
GFR calc non Af Amer: 16 mL/min — ABNORMAL LOW (ref >60)
GLUCOSE: 124 mg/dL — AB (ref 65–99)
Potassium: 3.8 mmol/L (ref 3.5–5.1)
SODIUM: 137 mmol/L (ref 135–145)
TOTAL PROTEIN: 6.6 g/dL (ref 6.5–8.1)

## 2017-08-28 LAB — BRAIN NATRIURETIC PEPTIDE: B Natriuretic Peptide: 1330 pg/mL — ABNORMAL HIGH (ref 0.0–100.0)

## 2017-08-28 LAB — PROTIME-INR
INR: 1.33
Prothrombin Time: 16.4 seconds — ABNORMAL HIGH (ref 11.4–15.2)

## 2017-08-28 LAB — I-STAT CG4 LACTIC ACID, ED
Lactic Acid, Venous: 0.63 mmol/L (ref 0.5–1.9)
Lactic Acid, Venous: 0.45 mmol/L — ABNORMAL LOW (ref 0.5–1.9)

## 2017-08-28 LAB — RETICULOCYTES
RBC.: 2.75 MIL/uL — ABNORMAL LOW (ref 3.87–5.11)
Retic Count, Absolute: 66 10*3/uL (ref 19.0–186.0)
Retic Ct Pct: 2.4 % (ref 0.4–3.1)

## 2017-08-28 LAB — TROPONIN I: Troponin I: 1.05 ng/mL (ref <0.03)

## 2017-08-28 LAB — GLUCOSE, CAPILLARY: Glucose-Capillary: 96 mg/dL (ref 65–99)

## 2017-08-28 LAB — CBG MONITORING, ED: Glucose-Capillary: 122 mg/dL — ABNORMAL HIGH (ref 65–99)

## 2017-08-28 MED ORDER — PANTOPRAZOLE SODIUM 40 MG PO TBEC
40.0000 mg | DELAYED_RELEASE_TABLET | Freq: Every day | ORAL | Status: DC
  Administered 2017-08-28 – 2017-09-04 (×8): 40 mg via ORAL
  Filled 2017-08-28 (×8): qty 1

## 2017-08-28 MED ORDER — ASPIRIN 325 MG PO TABS
325.0000 mg | ORAL_TABLET | Freq: Every day | ORAL | Status: DC
  Administered 2017-08-28: 325 mg via ORAL
  Filled 2017-08-28: qty 1

## 2017-08-28 MED ORDER — SODIUM CHLORIDE 0.9 % IV BOLUS (SEPSIS)
500.0000 mL | Freq: Once | INTRAVENOUS | Status: AC
  Administered 2017-08-28: 500 mL via INTRAVENOUS

## 2017-08-28 MED ORDER — ACETAMINOPHEN 650 MG RE SUPP
RECTAL | Status: AC
  Administered 2017-08-28: 650 mg
  Filled 2017-08-28: qty 1

## 2017-08-28 MED ORDER — HEPARIN BOLUS VIA INFUSION
3000.0000 [IU] | Freq: Once | INTRAVENOUS | Status: AC
  Administered 2017-08-28: 3000 [IU] via INTRAVENOUS
  Filled 2017-08-28: qty 3000

## 2017-08-28 MED ORDER — DEXTROSE 5 % IV SOLN
1.0000 g | INTRAVENOUS | Status: DC
  Administered 2017-08-28 – 2017-08-29 (×2): 1 g via INTRAVENOUS
  Filled 2017-08-28 (×3): qty 1

## 2017-08-28 MED ORDER — CARVEDILOL 6.25 MG PO TABS
6.2500 mg | ORAL_TABLET | Freq: Two times a day (BID) | ORAL | Status: DC
  Administered 2017-08-28 – 2017-09-04 (×14): 6.25 mg via ORAL
  Filled 2017-08-28 (×10): qty 1
  Filled 2017-08-28: qty 2
  Filled 2017-08-28 (×3): qty 1

## 2017-08-28 MED ORDER — FUROSEMIDE 10 MG/ML IJ SOLN
40.0000 mg | Freq: Two times a day (BID) | INTRAMUSCULAR | Status: DC
  Administered 2017-08-28 – 2017-08-29 (×2): 40 mg via INTRAVENOUS
  Filled 2017-08-28 (×2): qty 4

## 2017-08-28 MED ORDER — INSULIN ASPART 100 UNIT/ML ~~LOC~~ SOLN: 0.0000 [IU] | Freq: Every day | SUBCUTANEOUS | Status: DC

## 2017-08-28 MED ORDER — SODIUM CHLORIDE 0.9 % IV SOLN: 250.0000 mL | INTRAVENOUS | Status: DC | PRN

## 2017-08-28 MED ORDER — VANCOMYCIN HCL IN DEXTROSE 1-5 GM/200ML-% IV SOLN: 1000.0000 mg | Freq: Once | INTRAVENOUS | Status: DC

## 2017-08-28 MED ORDER — VANCOMYCIN HCL 10 G IV SOLR
1500.0000 mg | Freq: Once | INTRAVENOUS | Status: AC
  Administered 2017-08-28: 1500 mg via INTRAVENOUS
  Filled 2017-08-28: qty 1500

## 2017-08-28 MED ORDER — SODIUM CHLORIDE 0.9 % IV SOLN
INTRAVENOUS | Status: DC
  Administered 2017-08-28: 09:00:00 via INTRAVENOUS

## 2017-08-28 MED ORDER — DEXTROSE 5 % IV SOLN
INTRAVENOUS | Status: AC
  Filled 2017-08-28: qty 1

## 2017-08-28 MED ORDER — HEPARIN SODIUM (PORCINE) 5000 UNIT/ML IJ SOLN
5000.0000 [IU] | Freq: Three times a day (TID) | INTRAMUSCULAR | Status: DC
  Administered 2017-08-28: 5000 [IU] via SUBCUTANEOUS
  Filled 2017-08-28: qty 1

## 2017-08-28 MED ORDER — SODIUM CHLORIDE 0.9% FLUSH: 3.0000 mL | INTRAVENOUS | Status: DC | PRN

## 2017-08-28 MED ORDER — ACETAMINOPHEN 325 MG PO TABS: 650.0000 mg | ORAL_TABLET | ORAL | Status: DC | PRN

## 2017-08-28 MED ORDER — SODIUM CHLORIDE 0.9% FLUSH
3.0000 mL | Freq: Two times a day (BID) | INTRAVENOUS | Status: DC
  Administered 2017-08-29 – 2017-09-04 (×12): 3 mL via INTRAVENOUS

## 2017-08-28 MED ORDER — INSULIN ASPART 100 UNIT/ML ~~LOC~~ SOLN
0.0000 [IU] | Freq: Three times a day (TID) | SUBCUTANEOUS | Status: DC
  Administered 2017-08-31: 3 [IU] via SUBCUTANEOUS
  Administered 2017-09-01: 2 [IU] via SUBCUTANEOUS
  Administered 2017-09-03: 3 [IU] via SUBCUTANEOUS

## 2017-08-28 MED ORDER — AZTREONAM 2 G IJ SOLR
2.0000 g | Freq: Once | INTRAMUSCULAR | Status: AC
  Administered 2017-08-28: 2 g via INTRAVENOUS
  Filled 2017-08-28: qty 2

## 2017-08-28 MED ORDER — ONDANSETRON HCL 4 MG/2ML IJ SOLN
4.0000 mg | Freq: Four times a day (QID) | INTRAMUSCULAR | Status: DC | PRN
Start: 2017-08-28 — End: 2017-09-04

## 2017-08-28 MED ORDER — LEVOFLOXACIN IN D5W 750 MG/150ML IV SOLN
750.0000 mg | Freq: Once | INTRAVENOUS | Status: AC
  Administered 2017-08-28: 750 mg via INTRAVENOUS
  Filled 2017-08-28: qty 150

## 2017-08-28 MED ORDER — HEPARIN (PORCINE) IN NACL 100-0.45 UNIT/ML-% IJ SOLN
1600.0000 [IU]/h | INTRAMUSCULAR | Status: DC
  Administered 2017-08-28: 700 [IU]/h via INTRAVENOUS
  Administered 2017-08-29: 1300 [IU]/h via INTRAVENOUS
  Administered 2017-08-30: 1600 [IU]/h via INTRAVENOUS
  Filled 2017-08-28 (×4): qty 250

## 2017-08-28 NOTE — ED Provider Notes (Signed)
[facility] EMERGENCY DEPARTMENT Provider Note   CSN: [id number] Arrival date & time: 08/28/17  5732     History   Chief Complaint Chief Complaint  Patient presents with  . Altered Mental Status    HPI Madison Reed is a 66 y.o. female.  Patient brought in by EMS for being unresponsive.  Patient's husband said he found her that way this morning.  Said she was okay yesterday.  Patient arrived able to say name only stimulator.  Patient's been on antibiotics for urinary tract infection husband stated it was clindamycin.  EMS reported oxygen sats of 85% on 2 L at home.  Her temp was 101.8 axillary by EMS.  Patient has dressings on both feet.  Excoriations under her breasts and groin area.  Oxygen saturations increased to 89% on 2 L and patient was started on 4 L with an increase to 91% by EMS.  Upon arrival here patient temp was 101.6.  She was tachypneic with respiratory rate of 24 or greater.  Not hypotensive.  Not tachycardic.  Patient met criteria for initiating sepsis protocol.  Patient's husband also stated that she has had a prior stroke.  But had no real residual deficits.  She is not very ambulatory due to other reasons that he could not explain.      Past Medical History:  Diagnosis Date  . 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 Bone spur   . Chronic back pain   . CVA (cerebral infarction)   . Diabetes mellitus without complication ([place])   . Diastolic heart failure ([place])   . Hypertension   . Kidney stones   . Neuropathy   . Psoriasis   . Stroke ([place])   . Wound, open, foot     Patient Active Problem List   Diagnosis Date Noted  . Acute cystitis without hematuria   . Stage II pressure ulcer of sacral region 06/14/2017  . Demand ischemia ([place]) 06/13/2017  . Essential hypertension 06/13/2017  . Lobar pneumonia ([place]) 06/10/2017  . Pressure injury of skin 06/10/2017  . Elevated troponin 06/10/2017  . DM type 2 (diabetes mellitus, type 2) ([place]) 06/10/2017  . Chronic  respiratory failure with hypoxia ([place]) 06/10/2017  . Acute diastolic heart failure ([place]) 05/27/2017  . Dyspnea   . AKI (acute kidney injury) ([place]) 04/30/2017  . Hypoglycemia 04/30/2017  . Septic arthritis of IP joint of toe ([place])   . DM type 2, uncontrolled, with lower extremity ulcer ([place]) 03/23/2015  . Diabetic ulcer of left foot ([place])   . Weakness of right arm/hand 09/18/2013  . Unstable balance 08/11/2013  . Foot and toe(s), blister, infected 08/11/2013  . Difficulty walking 08/11/2013  . Weakness of right leg 08/11/2013  . Acute ischemic stroke ([place]) 08/03/2013  . Hypokalemia 08/03/2013  . Type 2 diabetes with nephropathy ([place])   . Neuropathy   . Chronic back pain   . Psoriasis   . Wound, open, foot     Past Surgical History:  Procedure Laterality Date  . ABDOMINAL HYSTERECTOMY    . BLADDER SUSPENSION    . VESICOVAGINAL FISTULA CLOSURE W/ TAH      OB History    Gravida Para Term Preterm AB Living   2 2 2     2    SAB TAB Ectopic Multiple Live Births                   Home Medications    Prior to Admission medications   Medication  Sig Start Date End Date Taking? Authorizing Provider  amLODipine (NORVASC) 10 MG tablet Take 10 mg by mouth daily.   Yes [provider]  aspirin 325 MG tablet Take 1 tablet (325 mg total) by mouth daily. 05/07/17  Yes Isaac Bliss, Rayford Halsted, MD  carvedilol (COREG) 6.25 MG tablet Take 1 tablet (6.25 mg total) by mouth 2 (two) times daily with a meal. 05/06/17  Yes Isaac Bliss, Rayford Halsted, MD  clindamycin (CLEOCIN) 300 MG capsule Take 300 mg 2 (two) times daily by mouth. For 7 days 08/24/17  Yes [provider]  cyanocobalamin 1000 MCG tablet Take 1 tablet (1,000 mcg total) by mouth daily. 06/17/17  Yes Reyne Dumas, MD  doxazosin (CARDURA) 4 MG tablet Take 1 tablet (4 mg total) by mouth daily. 05/09/17  Yes Reyne Dumas, MD  fenofibrate 160 MG tablet Take 160 mg by mouth daily.   Yes [provider]    fexofenadine (ALLEGRA) 180 MG tablet Take 180 mg by mouth daily.   Yes [provider]  folic acid (FOLVITE) 1 MG tablet Take 1 tablet (1 mg total) by mouth daily. 06/17/17  Yes Reyne Dumas, MD  furosemide (LASIX) 40 MG tablet Take 1 tablet (40 mg total) by mouth 2 (two) times daily. 06/17/17 06/17/18 Yes Reyne Dumas, MD  gabapentin (NEURONTIN) 100 MG capsule Take 200 mg by mouth at bedtime.    Yes [provider]  hydrALAZINE (APRESOLINE) 100 MG tablet Take 1 tablet (100 mg total) by mouth every 8 (eight) hours. 05/08/17  Yes Reyne Dumas, MD  insulin glargine (LANTUS) 100 UNIT/ML injection Inject 0.12 mLs (12 Units total) into the skin daily. 05/09/17  Yes Reyne Dumas, MD  omeprazole (PRILOSEC) 20 MG capsule Take 20 mg by mouth daily.   Yes [provider]  ondansetron (ZOFRAN) 4 MG tablet Take 4 mg by mouth every 8 (eight) hours as needed for nausea or vomiting.   Yes [provider]  potassium chloride SA (K-DUR,KLOR-CON) 20 MEQ tablet Take 2 tablets (40 mEq total) by mouth daily. 06/17/17  Yes Reyne Dumas, MD  sitaGLIPtin (JANUVIA) 25 MG tablet Take 25 mg by mouth daily.   Yes [provider]    Family History Family History  Problem Relation Age of Onset  . Aneurysm Mother   . Diabetes Mother   . Hypertension Mother   . Cancer Neg Hx   . Heart disease Neg Hx   . Stroke Neg Hx     Social History Social History   Tobacco Use  . Smoking status: Former Smoker    Packs/day: 1.00    Years: 30.00    Pack years: 30.00  . Smokeless tobacco: Never Used  Substance Use Topics  . Alcohol use: No  . Drug use: No     Allergies   Glipizide; Penicillins; Clonidine derivatives; Codeine; Hydrocodone-acetaminophen; Plasticized base [plastibase]; Shellfish allergy; Tape; and Sulfa antibiotics   Review of Systems Review of Systems  Unable to perform ROS: Mental status change     Physical Exam Updated Vital Signs BP (!) 127/50   Pulse 71    Temp 99.7 F (37.6 C)   Resp (!) 22   Wt 75.3 kg (166 lb)   SpO2 96%   BMI 26.79 kg/m   Physical Exam  Constitutional: She appears well-developed and well-nourished. She appears distressed.  HENT:  Head: Normocephalic and atraumatic.  Mucous membranes dry  Eyes: Conjunctivae are normal. Pupils are equal, round, and reactive to light.  Neck: Neck supple.  Cardiovascular: Normal rate, regular rhythm and normal heart sounds.  Pulmonary/Chest: Breath sounds normal. She is in respiratory distress. She has no wheezes. She has no rales.  Abdominal: Soft. Bowel sounds are normal. There is no tenderness.  Musculoskeletal: Normal range of motion. She exhibits edema.  Dressings and wounds to both feet.  No significant erythema in that area.  Neurological:  Patient occasionally will state her name will not follow commands.  Skin: Skin is warm.  Nursing note and vitals reviewed.    ED Treatments / Results  Labs (all labs ordered are listed, but only abnormal results are displayed) Labs Reviewed  COMPREHENSIVE METABOLIC PANEL - Abnormal; Notable for the following components:      Result Value   Glucose, Bld 124 (*)    BUN 42 (*)    Creatinine, Ser 2.98 (*)    Calcium 8.3 (*)    Albumin 2.7 (*)    Alkaline Phosphatase 35 (*)    GFR calc non Af Amer 16 (*)    GFR calc Af Amer 18 (*)    All other components within normal limits  CBC WITH DIFFERENTIAL/PLATELET - Abnormal; Notable for the following components:   RBC 3.14 (*)    Hemoglobin 7.8 (*)    HCT 26.8 (*)    MCH 24.8 (*)    MCHC 29.1 (*)    RDW 16.6 (*)    All other components within normal limits  URINALYSIS, ROUTINE W REFLEX MICROSCOPIC - Abnormal; Notable for the following components:   APPearance CLOUDY (*)    Hgb urine dipstick SMALL (*)    Protein, ur 100 (*)    Leukocytes, UA LARGE (*)    Bacteria, UA MANY (*)    All other components within normal limits  PROTIME-INR - Abnormal; Notable for the following  components:   Prothrombin Time 16.4 (*)    All other components within normal limits  BRAIN NATRIURETIC PEPTIDE - Abnormal; Notable for the following components:   B Natriuretic Peptide 1,330.0 (*)    All other components within normal limits  CBG MONITORING, ED - Abnormal; Notable for the following components:   Glucose-Capillary 122 (*)    All other components within normal limits  I-STAT CG4 LACTIC ACID, ED - Abnormal; Notable for the following components:   Lactic Acid, Venous 0.45 (*)    All other components within normal limits  CULTURE, BLOOD (ROUTINE X 2)  CULTURE, BLOOD (ROUTINE X 2)  URINE CULTURE  I-STAT CG4 LACTIC ACID, ED    EKG  EKG Interpretation  Date/Time:  Saturday August 28 2017 08:13:57 EST Ventricular Rate:  87 PR Interval:    QRS Duration: 107 QT Interval:  345 QTC Calculation: 415 R Axis:   90 Text Interpretation:  Sinus rhythm Borderline right axis deviation Nonspecific repol abnormality, diffuse leads Interpretation limited secondary to artifact No significant change since last tracing Confirmed by Fredia Sorrow [phone]) on 08/28/2017 8:42:45 AM       Radiology Ct Head Wo Contrast  Result Date: 08/28/2017 CLINICAL DATA:  Unresponsive, fever. EXAM: CT HEAD WITHOUT CONTRAST TECHNIQUE: Contiguous axial images were obtained from the base of the skull through the vertex without intravenous contrast. COMPARISON:  06/10/2017 FINDINGS: Brain: There is atrophy and chronic small vessel disease changes. Old right basal ganglia/internal capsule lacunar infarct. Old right cerebellar infarct. No acute intracranial abnormality. Specifically, no hemorrhage, hydrocephalus, mass lesion, acute infarction, or significant intracranial injury. Vascular: No hyperdense vessel or unexpected calcification. Skull: No  acute calvarial abnormality. Sinuses/Orbits: Visualized paranasal sinuses and mastoids clear. Orbital soft tissues unremarkable. Other: None IMPRESSION: Old right  basal ganglia/internal capsule lacunar infarct and right cerebellar lacunar infarct. No acute intracranial abnormality. Atrophy, chronic small vessel disease. Electronically Signed   By: Rolm Baptise M.D.   On: 08/28/2017 10:43   Ct Chest Wo Contrast  Result Date: 08/28/2017 CLINICAL DATA:  Acute respiratory illness, greater than 49 years old. EXAM: CT CHEST WITHOUT CONTRAST TECHNIQUE: Multidetector CT imaging of the chest was performed following the standard protocol without IV contrast. COMPARISON:  None. FINDINGS: Cardiovascular: The heart is mildly enlarged. Diffuse coronary artery calcifications are present. A small pericardial effusion is present. Atherosclerotic calcifications are present in the aorta and branch vessels. There is mild prominence pulmonary artery is bilaterally. Mediastinum/Nodes: 10 mm right paratracheal lymph node is evident just above the carina. Other subcentimeter mediastinal lymph nodes are present. No definite hilar adenopathy is present. No significant axillary adenopathy is present. A 1.9 cm hypodense nodule is present inferiorly in the left lobe of the thyroid. Lungs/Pleura: Bilateral pleural effusions are present. Diffuse patchy ground-glass attenuation is present in the lungs bilaterally. Consolidated bilateral lower lobe airspace disease is noted bilaterally. Upper Abdomen: The spleen is enlarged. No focal lesions are evident. Musculoskeletal: Vertebral body heights and alignment are maintained. No acute or healing fractures are present. No focal lytic or blastic lesions are present. IMPRESSION: 1. Large bilateral scratched at cardiomegaly with prominent bilateral pleural effusions and diffuse patchy ground-glass attenuation suggesting edema and congestive heart failure. 2. More prominent airspace consolidation at the bases bilaterally likely reflects atelectasis. Infection is not excluded. 3. Splenomegaly of uncertain etiology. 4.  Aortic Atherosclerosis (ICD10-I70.0).  Electronically Signed   By: San Morelle M.D.   On: 08/28/2017 10:48   Dg Chest Port 1 View  Result Date: 08/28/2017 CLINICAL DATA:  Fever.  Altered mental status. EXAM: PORTABLE CHEST 1 VIEW COMPARISON:  Two-view chest x-ray 06/10/2017 FINDINGS: The heart is enlarged. Aortic atherosclerosis is present. Interstitial edema and bilateral effusions are present. Bibasilar airspace disease is present. IMPRESSION: 1. Congestive heart failure. 2. Bilateral lower lobe airspace disease likely reflects atelectasis. Infection or aspiration is also considered. Electronically Signed   By: San Morelle M.D.   On: 08/28/2017 08:47    Procedures Procedures (including critical care time)  CRITICAL CARE Performed by: Fredia Sorrow Total critical care time:  30 minutes Critical care time was exclusive of separately billable procedures and treating other patients. Critical care was necessary to treat or prevent imminent or life-threatening deterioration. Critical care was time spent personally by me on the following activities: development of treatment plan with patient and/or surrogate as well as nursing, discussions with consultants, evaluation of patient's response to treatment, examination of patient, obtaining history from patient or surrogate, ordering and performing treatments and interventions, ordering and review of laboratory studies, ordering and review of radiographic studies, pulse oximetry and re-evaluation of patient's condition.   Medications Ordered in ED Medications  0.9 %  sodium chloride infusion ( Intravenous New Bag/Given 08/28/17 0855)  acetaminophen (TYLENOL) 650 MG suppository (650 mg  Given 08/28/17 0839)  levofloxacin (LEVAQUIN) IVPB 750 mg (0 mg Intravenous Stopped 08/28/17 1000)  aztreonam (AZACTAM) 2 g in dextrose 5 % 50 mL IVPB (0 g Intravenous Stopped 08/28/17 0936)  sodium chloride 0.9 % bolus 500 mL (0 mLs Intravenous Stopped 08/28/17 0855)  vancomycin  (VANCOCIN) 1,500 mg in sodium chloride 0.9 % 500 mL IVPB (0 mg Intravenous Stopped 08/28/17 1210)  Initial Impression / Assessment and Plan / ED Course  I have reviewed the triage vital signs and the nursing notes.  Pertinent labs & imaging results that were available during my care of the patient were reviewed by me and considered in my medical decision making (see chart for details).     Patient met sepsis criteria.  Started on sepsis protocol.  Patient did not have indications for the full 30 mL/kg bolus.  Patient was not hypotensive.  Lactic acid was not elevated.  Foley catheter was placed which showed purulent urine.  Most likely the source.  Chest x-ray raised some concerns for pneumonia and/or pulmonary edema.  CT chest did show some evidence of some fluid.  But no evidence of pneumonia.  Head CT was negative other than the remote lacunar infarcts.  Patient received antibiotics as stated.  Patient started to show some improvement with the treatment.  Some evidence of some acute renal failure.  No leukocytosis.  As stated lactic acid was not elevated.  Discussed with hospitalist they will admit to stepdown overnight suspect that the urine is the main source.  Patient had blood cultures and urine cultures pending.  Final Clinical Impressions(s) / ED Diagnoses   Final diagnoses:  Sepsis, due to unspecified organism [facility])    ED Discharge Orders    None       Fredia Sorrow, MD 08/28/17 1538

## 2017-08-28 NOTE — Consult Note (Addendum)
Cardiology Consultation:   Patient ID: Madison Reed; [id number]; [birth year]   Admit date: 08/28/2017 Date of Consult: 08/29/2017  Primary Care Provider: Celene Squibb, MD Primary Cardiologist: None Primary Electrophysiologist:  None Referring Physician: Kathie Dike, MD   Patient Profile:   Madison Reed is a 66 y.o. female with a hx of HFpEF, DM2, HTN, prior stroke who is transferred to [facility] from [facility], where she presented with heart failure exacerbation. Cardiology was consulted for positive troponin.   History of Present Illness:   Madison Reed is a 66 y.o. female with a hx of HFpEF, DM2, HTN, prior stroke who is transferred to [facility] from [facility], where she presented with AMS. Cardiology was consulted for positive troponin.   The patient was hospitalized today at [facility] with AMS after she was found to be unresponsive by her husband. She had not associated chest pain or dyspnea. Workup at the outside hospital was consistent with UTI. She was also found to have signs of hypervolemia suggestive of heart failure exacerbation, and IV furosemide was ordered. ECGs showed NSR with nonspecific ST changes. Troponin was measured and elevated at 1.05; BNP elevated at 13,330 (previously 200-700s). Due to need for cardiology evaluation, transfer to [facility] was requested.  On arrival to [facility], the patient remained free of chest pain. Repeat ECG showed diffuse and non-specific ST changes. Repeat troponin is ordered. On my evaluation, the patient is resting comfortably in bed. She denies any active or recent chest pain or dyspnea. She states that she has noticed increased LE edema but has not been weighing herself regularly. She is unable to tell me any of her medications and states that her daughter (who is not present) does this for her.    Past Medical History:  Diagnosis Date  . Abdominal aneurysm ([place])   . Bone spur   . Chronic back pain   . CVA (cerebral infarction)   .  Diabetes mellitus without complication ([place])   . Diastolic heart failure ([place])   . Hypertension   . Kidney stones   . Neuropathy   . Psoriasis   . Stroke ([place])   . Wound, open, foot     Past Surgical History:  Procedure Laterality Date  . ABDOMINAL HYSTERECTOMY    . BLADDER SUSPENSION    . VESICOVAGINAL FISTULA CLOSURE W/ TAH       Home Medications:  Prior to Admission medications   Medication Sig Start Date End Date Taking? Authorizing Provider  amLODipine (NORVASC) 10 MG tablet Take 10 mg by mouth daily.   Yes [provider]  aspirin 325 MG tablet Take 1 tablet (325 mg total) by mouth daily. 05/07/17  Yes Isaac Bliss, Rayford Halsted, MD  carvedilol (COREG) 6.25 MG tablet Take 1 tablet (6.25 mg total) by mouth 2 (two) times daily with a meal. 05/06/17  Yes Isaac Bliss, Rayford Halsted, MD  cyanocobalamin 1000 MCG tablet Take 1 tablet (1,000 mcg total) by mouth daily. 06/17/17  Yes Reyne Dumas, MD  doxazosin (CARDURA) 4 MG tablet Take 1 tablet (4 mg total) by mouth daily. 05/09/17  Yes Reyne Dumas, MD  fenofibrate 160 MG tablet Take 160 mg by mouth daily.   Yes [provider]  fexofenadine (ALLEGRA) 180 MG tablet Take 180 mg by mouth daily.   Yes [provider]  folic acid (FOLVITE) 1 MG tablet Take 1 tablet (1 mg total) by mouth daily. 06/17/17  Yes Reyne Dumas, MD  furosemide (LASIX) 40 MG tablet Take 1 tablet (40 mg total) by mouth 2 (two) times daily. 06/17/17 06/17/18 Yes Reyne Dumas, MD  gabapentin (NEURONTIN) 100 MG capsule Take 200 mg by mouth at bedtime.    Yes [provider]  hydrALAZINE (APRESOLINE) 100 MG tablet Take 1 tablet (100 mg total) by mouth every 8 (eight) hours. 05/08/17  Yes Reyne Dumas, MD  insulin glargine (LANTUS) 100 UNIT/ML injection Inject 0.12 mLs (12 Units total) into the skin daily. 05/09/17  Yes Reyne Dumas, MD  omeprazole (PRILOSEC) 20 MG capsule Take 20 mg by mouth daily.   Yes [provider]    ondansetron (ZOFRAN) 4 MG tablet Take 4 mg by mouth every 8 (eight) hours as needed for nausea or vomiting.   Yes [provider]  potassium chloride SA (K-DUR,KLOR-CON) 20 MEQ tablet Take 2 tablets (40 mEq total) by mouth daily. 06/17/17  Yes Reyne Dumas, MD  sitaGLIPtin (JANUVIA) 25 MG tablet Take 25 mg by mouth daily.   Yes [provider]    Inpatient Medications: Scheduled Meds: . aspirin  81 mg Oral Daily  . carvedilol  6.25 mg Oral BID WC  . furosemide  40 mg Intravenous BID  . insulin aspart  0-15 Units Subcutaneous TID WC  . insulin aspart  0-5 Units Subcutaneous QHS  . pantoprazole  40 mg Oral Daily  . sodium chloride flush  3 mL Intravenous Q12H   Continuous Infusions: . sodium chloride    . ceFEPime (MAXIPIME) IV Stopped (08/28/17 1726)  . heparin 700 Units/hr (08/28/17 1939)   PRN Meds: sodium chloride, acetaminophen, ondansetron (ZOFRAN) IV, sodium chloride flush  Allergies:    Allergies  Allergen Reactions  . Glipizide     Severe hypoglycemia (22!)  . Penicillins Itching    Has patient had a PCN reaction causing immediate rash, facial/tongue/throat swelling, SOB or lightheadedness with hypotension: Yes Has patient had a PCN reaction causing severe rash involving mucus membranes or skin necrosis: No Has patient had a PCN reaction that required hospitalization: No Has patient had a PCN reaction occurring within the last 10 years: No If all of the above answers are "NO", then may proceed with Cephalosporin use.   . Clonidine Derivatives Other (See Comments)    Numbness  to tongue face, and arm  . Codeine Itching  . Hydrocodone-Acetaminophen Itching  . Plasticized Base [Plastibase]   . Shellfish Allergy Itching  . Tape   . Sulfa Antibiotics Itching and Rash    Social History:   Social History   Socioeconomic History  . Marital status: Married    Spouse name: Not on file  . Number of children: Not on file  . Years of education: Not on  file  . Highest education level: Not on file  Social Needs  . Financial resource strain: Not on file  . Food insecurity - worry: Not on file  . Food insecurity - inability: Not on file  . Transportation needs - medical: Not on file  . Transportation needs - non-medical: Not on file  Occupational History  . Not on file  Tobacco Use  . Smoking status: Former Smoker    Packs/day: 1.00    Years: 30.00    Pack years: 30.00  . Smokeless tobacco: Never Used  Substance and Sexual Activity  . Alcohol use: No  . Drug use: No  . Sexual activity: Not on file  Other Topics Concern  . Not on file  Social History Narrative  .  Not on file    Family History:   Family History  Problem Relation Age of Onset  . Aneurysm Mother   . Diabetes Mother   . Hypertension Mother   . Cancer Neg Hx   . Heart disease Neg Hx   . Stroke Neg Hx      ROS:  Please see the history of present illness.  All other ROS reviewed and negative.     Physical Exam/Data:   Vitals:   08/28/17 1700 08/28/17 1800 08/28/17 1900 08/28/17 2116  BP: (!) 158/59 (!) 130/55  (!) 133/52  Pulse: 71 64  64  Resp: 16 18  18   Temp: 98 F (36.7 C)  98.3 F (36.8 C) 98.2 F (36.8 C)  TempSrc:   Oral Oral  SpO2: 98% 96%  97%  Weight:      Height:        Intake/Output Summary (Last 24 hours) at 08/29/2017 0014 Last data filed at 08/28/2017 1641 Gross per 24 hour  Intake -  Output 1450 ml  Net -1450 ml   Filed Weights   08/28/17 0812 08/28/17 1542  Weight: 75.3 kg (166 lb) 97.4 kg (214 lb 11.7 oz)   Body mass index is 34.66 kg/m.  General:  Well nourished, well developed, in no acute distress HEENT: normal Neck: JVD visible at earlobe while semi-erect Cardiac:  normal S1, S2; RRR; no murmur  Lungs:  Normal work of breathing with crackles in lower lung fields bilaterally Abd: soft, nontender, no hepatomegaly  Ext: 2-3+ pitting LE edema Musculoskeletal:  No deformities, normal muscle bulk Skin: warm and dry    Neuro: No focal abnormalities noted Psych:  Normal affect   EKG:  The EKG was personally reviewed and demonstrates:  NSR with non-specific ST changes as above Telemetry:  Ordered  Relevant CV Studies: Study Conclusions: - Left ventricle: The cavity size was normal. Wall thickness was   increased increased in a pattern of mild to moderate LVH.   Systolic function was normal. The estimated ejection fraction was in the range of 60% to 65%. Wall motion was normal; there were no regional wall motion abnormalities. Features are consistent with a pseudonormal left ventricular filling pattern, with concomitant abnormal relaxation and increased filling pressure (grade 2  diastolic dysfunction). - Mitral valve: There was mild regurgitation. - Right atrium: Central venous pressure (est): 8 mm Hg. - Atrial septum: The septum bowed from left to right, consistent with increased left atrial pressure. No defect or patent foramen ovale was identified. - Tricuspid valve: There was trivial regurgitation. - Pulmonary arteries: Systolic pressure could not be accurately estimated. - Pericardium, extracardiac: Small to moderate circumferential pericardial effusion with evidence of organization and possible chronicity. There was a possible small right pleural effusion. Impressions: - Mild to moderate LVH with LVEF 60-65% and grade 2 diastolic   dysfunction. Mild mitral regurgitation. Trivial tricuspid regurgitation. Bowing of the interatrial septum from left to right without obvious ASD or PFO. Small to moderate circumferential pericardial effusion with evidence of organization and possible chronicity. Possible small right  pleural effusion.  Laboratory Data:  Chemistry Recent Labs  Lab 08/28/17 0829  NA 137  K 3.8  CL 103  CO2 26  GLUCOSE 124*  BUN 42*  CREATININE 2.98*  CALCIUM 8.3*  GFRNONAA 16*  GFRAA 18*  ANIONGAP 8    Recent Labs  Lab 08/28/17 0829  PROT 6.6  ALBUMIN 2.7*  AST 41  ALT 14   ALKPHOS 35*  BILITOT  1.2   Hematology Recent Labs  Lab 08/28/17 0829 08/28/17 1639  WBC 4.1  --   RBC 3.14* 2.75*  HGB 7.8*  --   HCT 26.8*  --   MCV 85.4  --   MCH 24.8*  --   MCHC 29.1*  --   RDW 16.6*  --   PLT 155  --    Cardiac Enzymes Recent Labs  Lab 08/28/17 1639  TROPONINI 1.05*   No results for input(s): TROPIPOC in the last 168 hours.  BNP Recent Labs  Lab 08/28/17 0829  BNP 1,330.0*    DDimer No results for input(s): DDIMER in the last 168 hours.  Radiology/Studies:  Ct Head Wo Contrast  Result Date: 08/28/2017 CLINICAL DATA:  Unresponsive, fever. EXAM: CT HEAD WITHOUT CONTRAST TECHNIQUE: Contiguous axial images were obtained from the base of the skull through the vertex without intravenous contrast. COMPARISON:  06/10/2017 FINDINGS: Brain: There is atrophy and chronic small vessel disease changes. Old right basal ganglia/internal capsule lacunar infarct. Old right cerebellar infarct. No acute intracranial abnormality. Specifically, no hemorrhage, hydrocephalus, mass lesion, acute infarction, or significant intracranial injury. Vascular: No hyperdense vessel or unexpected calcification. Skull: No acute calvarial abnormality. Sinuses/Orbits: Visualized paranasal sinuses and mastoids clear. Orbital soft tissues unremarkable. Other: None IMPRESSION: Old right basal ganglia/internal capsule lacunar infarct and right cerebellar lacunar infarct. No acute intracranial abnormality. Atrophy, chronic small vessel disease. Electronically Signed   By: Rolm Baptise M.D.   On: 08/28/2017 10:43   Ct Chest Wo Contrast  Addendum Date: 08/28/2017   ADDENDUM REPORT: 08/28/2017 14:55 ADDENDUM: Consider further evaluation with thyroid ultrasound. If patient is clinically hyperthyroid, consider nuclear medicine thyroid uptake and scan. Electronically Signed   By: San Morelle M.D.   On: 08/28/2017 14:55   Result Date: 08/28/2017 CLINICAL DATA:  Acute respiratory illness,  greater than 12 years old. EXAM: CT CHEST WITHOUT CONTRAST TECHNIQUE: Multidetector CT imaging of the chest was performed following the standard protocol without IV contrast. COMPARISON:  None. FINDINGS: Cardiovascular: The heart is mildly enlarged. Diffuse coronary artery calcifications are present. A small pericardial effusion is present. Atherosclerotic calcifications are present in the aorta and branch vessels. There is mild prominence pulmonary artery is bilaterally. Mediastinum/Nodes: 10 mm right paratracheal lymph node is evident just above the carina. Other subcentimeter mediastinal lymph nodes are present. No definite hilar adenopathy is present. No significant axillary adenopathy is present. A 1.9 cm hypodense nodule is present inferiorly in the left lobe of the thyroid. Lungs/Pleura: Bilateral pleural effusions are present. Diffuse patchy ground-glass attenuation is present in the lungs bilaterally. Consolidated bilateral lower lobe airspace disease is noted bilaterally. Upper Abdomen: The spleen is enlarged. No focal lesions are evident. Musculoskeletal: Vertebral body heights and alignment are maintained. No acute or healing fractures are present. No focal lytic or blastic lesions are present. IMPRESSION: 1. Large bilateral scratched at cardiomegaly with prominent bilateral pleural effusions and diffuse patchy ground-glass attenuation suggesting edema and congestive heart failure. 2. More prominent airspace consolidation at the bases bilaterally likely reflects atelectasis. Infection is not excluded. 3. Splenomegaly of uncertain etiology. 4.  Aortic Atherosclerosis (ICD10-I70.0). Electronically Signed: By: San Morelle M.D. On: 08/28/2017 10:48   Dg Chest Port 1 View  Result Date: 08/28/2017 CLINICAL DATA:  Fever.  Altered mental status. EXAM: PORTABLE CHEST 1 VIEW COMPARISON:  Two-view chest x-ray 06/10/2017 FINDINGS: The heart is enlarged. Aortic atherosclerosis is present. Interstitial  edema and bilateral effusions are present. Bibasilar airspace disease is present. IMPRESSION:  1. Congestive heart failure. 2. Bilateral lower lobe airspace disease likely reflects atelectasis. Infection or aspiration is also considered. Electronically Signed   By: San Morelle M.D.   On: 08/28/2017 08:47    Assessment and Plan:   DELORIES MAURI is a 66 y.o. female with a hx of HFpEF, DM2, HTN, prior stroke who is transferred to [facility] from [facility], where she presented with heart failure exacerbation. Cardiology was consulted for positive troponin.   HFpEF with acute on chronic hypervolemia Positive troponin The patient was admitted to the OSH with AMS secondary to UTI and was found to have signs of hypervolemia consistent with HFpEF exacerbation. Workup was also notable for moderately elevated troponin at 1.05. ECG shows nonspecific changes, and the patient is free of any symptoms to suggest acute ischemia. She has no known CAD, although she does have risk factors for vascular disease. At this time, the cause of her elevated troponin is unclear: it is quite possible that this finding is due to demand ischemia in the setting of her infection and heart failure exacerbation. ACS is possible despite the absence of chest pain, which could be explained in part by her underlying diabetes. She was started on a heparin infusion empirically prior to transfer. At this time, recommend continued management of her acute infection and acute on chronic heart failure exacerbation. Troponin levels and symptoms should be trended, and she will need ischemic evaluation at some point, with timing and modality to be determined by her clinical trajectory.  Recommendations: -Continue to monitor on telemetry -Continue to trend troponin levels and repeat ECG for any new or different symptoms -It is reasonable to continue heparin gtt at this time, although discontinuation may be indicated based on troponin  trend -Continue aggressive diuresis with twice daily IV furosemide.  -Changing ASA dose from 325 to 81 mg daily -Lipid panel, HgA1c ordered  -Repeat echocardiogram ordered -Please keep NPO this morning until plan for ischemic evaluation is determined.    For questions or updates, please contact [place] Please consult [email or web address] for contact info under Cardiology/STEMI.   Signed, Nila Nephew, MD  08/29/2017 12:14 AM

## 2017-08-28 NOTE — Progress Notes (Signed)
ANTICOAGULATION CONSULT NOTE - Initial Consult  Pharmacy Consult for Heparin Indication: chest pain/ACS  Allergies  Allergen Reactions  . Glipizide     Severe hypoglycemia (22!)  . Penicillins Itching    Has patient had a PCN reaction causing immediate rash, facial/tongue/throat swelling, SOB or lightheadedness with hypotension: Yes Has patient had a PCN reaction causing severe rash involving mucus membranes or skin necrosis: No Has patient had a PCN reaction that required hospitalization: No Has patient had a PCN reaction occurring within the last 10 years: No If all of the above answers are "NO", then may proceed with Cephalosporin use.   . Clonidine Derivatives Other (See Comments)    Numbness  to tongue face, and arm  . Codeine Itching  . Hydrocodone-Acetaminophen Itching  . Plasticized Base [Plastibase]   . Shellfish Allergy Itching  . Tape   . Sulfa Antibiotics Itching and Rash    Patient Measurements: Height: 5\' 6"  (167.6 cm) Weight: 214 lb 11.7 oz (97.4 kg) IBW/kg (Calculated) : 59.3    Vital Signs: Temp: 98 F (36.7 C) (11/17 1700) Temp Source: Rectal (11/17 0815) BP: 130/55 (11/17 1800) Pulse Rate: 64 (11/17 1800)  Labs: Recent Labs    08/28/17 0829 08/28/17 1639  HGB 7.8*  --   HCT 26.8*  --   PLT 155  --   LABPROT 16.4*  --   INR 1.33  --   CREATININE 2.98*  --   TROPONINI  --  1.05*    Estimated Creatinine Clearance: 22.4 mL/min (A) (by C-G formula based on SCr of 2.98 mg/dL (H)).   Medical History: Past Medical History:  Diagnosis Date  . Abdominal aneurysm ([place])   . Bone spur   . Chronic back pain   . CVA (cerebral infarction)   . Diabetes mellitus without complication ([place])   . Diastolic heart failure ([place])   . Hypertension   . Kidney stones   . Neuropathy   . Psoriasis   . Stroke ([place])   . Wound, open, foot     Medications:  Medications Prior to Admission  Medication Sig Dispense Refill Last Dose  . amLODipine (NORVASC) 10  MG tablet Take 10 mg by mouth daily.   08/27/2017 at Unknown time  . aspirin 325 MG tablet Take 1 tablet (325 mg total) by mouth daily.   08/27/2017 at Unknown time  . carvedilol (COREG) 6.25 MG tablet Take 1 tablet (6.25 mg total) by mouth 2 (two) times daily with a meal. 60 tablet 2 08/27/2017 at Unknown time  . cyanocobalamin 1000 MCG tablet Take 1 tablet (1,000 mcg total) by mouth daily. 60 tablet 1 08/27/2017 at Unknown time  . doxazosin (CARDURA) 4 MG tablet Take 1 tablet (4 mg total) by mouth daily. 30 tablet 1 08/27/2017 at Unknown time  . fenofibrate 160 MG tablet Take 160 mg by mouth daily.   08/27/2017 at Unknown time  . fexofenadine (ALLEGRA) 180 MG tablet Take 180 mg by mouth daily.   08/27/2017 at Unknown time  . folic acid (FOLVITE) 1 MG tablet Take 1 tablet (1 mg total) by mouth daily. 30 tablet 0 08/27/2017 at Unknown time  . furosemide (LASIX) 40 MG tablet Take 1 tablet (40 mg total) by mouth 2 (two) times daily. 60 tablet 11 08/27/2017 at Unknown time  . gabapentin (NEURONTIN) 100 MG capsule Take 200 mg by mouth at bedtime.    08/27/2017 at Unknown time  . hydrALAZINE (APRESOLINE) 100 MG tablet Take 1 tablet (100 mg  total) by mouth every 8 (eight) hours. 90 tablet 1 08/27/2017 at Unknown time  . insulin glargine (LANTUS) 100 UNIT/ML injection Inject 0.12 mLs (12 Units total) into the skin daily. 10 mL 11 08/27/2017 at Unknown time  . omeprazole (PRILOSEC) 20 MG capsule Take 20 mg by mouth daily.   Past Week at Unknown time  . ondansetron (ZOFRAN) 4 MG tablet Take 4 mg by mouth every 8 (eight) hours as needed for nausea or vomiting.   Past Month at Unknown time  . potassium chloride SA (K-DUR,KLOR-CON) 20 MEQ tablet Take 2 tablets (40 mEq total) by mouth daily. 120 tablet 1 08/27/2017 at Unknown time  . sitaGLIPtin (JANUVIA) 25 MG tablet Take 25 mg by mouth daily.   08/27/2017 at Unknown time  . [DISCONTINUED] clindamycin (CLEOCIN) 300 MG capsule Take 300 mg 2 (two) times daily by  mouth. For 7 days  0 08/27/2017 at Unknown time    Assessment: 66 yo female presented to ED unresponsive. Urinalysis indicated possible infection and urine was noted to look like pus.  Chest x-ray and CT imaging indicated possible decompensated CHF.  She was noted to have elevation of her creatinine. Patient doesn't have any chest pain but has an elevated troponin. Cardiology recommended initiate heparin and transfer to [facility].    Goal of Therapy:  Heparin level 0.3-0.7 units/ml Monitor platelets by anticoagulation protocol: Yes   Plan:  Give 3000 units bolus x 1 Start heparin infusion at 700 units/hr Check anti-Xa level in 6-8 hours and daily while on heparin Continue to monitor H&H and platelets  Isac Sarna, BS [place], BCPS Clinical Pharmacist Pager [phone] 08/28/2017,6:52 PM

## 2017-08-28 NOTE — ED Triage Notes (Addendum)
Pt brought in by EMS from home due to unresponsiveness. Found by husband. Pt able to say name to nurse. No response to painful stimuli. Pt has been on antibiotics for UTI . sats 85 on 2 L/min at home. Temp 101.8 ax by EMS. Noted to have dressings to bilateral feet and excoriation under breast and groin area. O2 increased 89 on 2 L/min and increased to 4 L/ with increase to 91 %

## 2017-08-28 NOTE — Progress Notes (Signed)
Pharmacy Antibiotic Note  Madison Reed is a 66 y.o. female admitted on 08/28/2017 with UTI.  Pharmacy has been consulted for Cefepime dosing.  Plan: Cefepime 1gm IV q24h F/U cxs and clinical progress Monitor V/S, labs  Height: 5\' 6"  (167.6 cm) Weight: 214 lb 11.7 oz (97.4 kg) IBW/kg (Calculated) : 59.3  Temp (24hrs), Avg:100.2 F (37.9 C), Min:99.1 F (37.3 C), Max:101.8 F (38.8 C)  Recent Labs  Lab 08/28/17 0829 08/28/17 0840 08/28/17 1137  WBC 4.1  --   --   CREATININE 2.98*  --   --   LATICACIDVEN  --  0.63 0.45*    Estimated Creatinine Clearance: 22.4 mL/min (A) (by C-G formula based on SCr of 2.98 mg/dL (H)).    Allergies  Allergen Reactions  . Glipizide     Severe hypoglycemia (22!)  . Penicillins Itching    Has patient had a PCN reaction causing immediate rash, facial/tongue/throat swelling, SOB or lightheadedness with hypotension: Yes Has patient had a PCN reaction causing severe rash involving mucus membranes or skin necrosis: No Has patient had a PCN reaction that required hospitalization: No Has patient had a PCN reaction occurring within the last 10 years: No If all of the above answers are "NO", then may proceed with Cephalosporin use.   . Clonidine Derivatives Other (See Comments)    Numbness  to tongue face, and arm  . Codeine Itching  . Hydrocodone-Acetaminophen Itching  . Plasticized Base [Plastibase]   . Shellfish Allergy Itching  . Tape   . Sulfa Antibiotics Itching and Rash    Antimicrobials this admission: Cefepime 11/17>>  Vancomycin, Aztreonam, and levaquin x 1 dose ED given in ED 11/17  Dose adjustments this admission: n/a  Microbiology results: 11/17 BCx: pending 11/17 UCx: pending  Thank you for allowing pharmacy to be a part of this patient's care.  Isac Sarna, BS [place], [place] Clinical Pharmacist Pager [phone] 08/28/2017 4:01 PM

## 2017-08-28 NOTE — Progress Notes (Addendum)
Called by staff that patient had elevated troponin at 1.05.  Patient is not having any chest pain.  EKG was repeated and shows T wave inversions in V2 and V3.  Case discussed with cardiologist on-call at [facility].  Due to lack of cardiology coverage at any pain over the weekend, it was felt reasonable to transfer the patient for further cardiology evaluation.  We will start the patient on intravenous heparin.  Continue on aspirin and beta-blocker.   Madison Reed

## 2017-08-28 NOTE — Progress Notes (Signed)
CRITICAL VALUE ALERT  Critical Value:  Troponin 1.05  Date & Time Notied:  08/28/17 @ 9234  Provider Notified: Dr. Roderic Palau  Orders Received/Actions taken: MD paged to make aware.

## 2017-08-28 NOTE — H&P (Addendum)
History and Physical    LAJUAN GODBEE PYP:[id number] DOB: 06/23/1951 DOA: 08/28/2017  PCP: Celene Squibb, MD  Patient coming from: Home  I have personally briefly reviewed patient's old medical records in [place]  Chief Complaint: Unresponsive  HPI: BILLIEJO SORTO is a 66 y.o. female with medical history significant of diastolic heart failure, diabetes, chronic respiratory failure on home oxygen, who was brought to the emergency room today by her husband when she was found unresponsive.  Patient's husband reports that she was in her usual state of health yesterday.  She he was unaware of her having any fever.  She did not complain of any cough or shortness of breath.  She has not had any vomiting or diarrhea.  No complaints of chest pain.  She had gone to bed and he was unable to wake her up this morning.  EMS was called and she was brought to the hospital for evaluation.  She received approximately a liter of IV fluids on route.  On arrival to the emergency room, she was noted to be hypoxic on her chronic 2 L and was placed on 4 L of oxygen.  She was also noted to be febrile.  Urinalysis indicated possible infection and urine was noted to look like pus.  Chest x-ray and CT imaging indicated possible decompensated CHF.  She was noted to have elevation of her creatinine.  Hemoglobin was noted to be low at 7.8, but she is not had any obvious signs of bleeding.  She is been referred for admission.  Review of Systems: As per HPI otherwise 10 point review of systems negative.    Past Medical History:  Diagnosis Date  . Abdominal aneurysm ([place])   . Bone spur   . Chronic back pain   . CVA (cerebral infarction)   . Diabetes mellitus without complication ([place])   . Diastolic heart failure ([place])   . Hypertension   . Kidney stones   . Neuropathy   . Psoriasis   . Stroke ([place])   . Wound, open, foot     Past Surgical History:  Procedure Laterality Date  . ABDOMINAL HYSTERECTOMY    .  BLADDER SUSPENSION    . VESICOVAGINAL FISTULA CLOSURE W/ TAH       reports that she has quit smoking. She has a 30.00 pack-year smoking history. she has never used smokeless tobacco. She reports that she does not drink alcohol or use drugs.  Allergies  Allergen Reactions  . Glipizide     Severe hypoglycemia (22!)  . Penicillins Itching    Has patient had a PCN reaction causing immediate rash, facial/tongue/throat swelling, SOB or lightheadedness with hypotension: Yes Has patient had a PCN reaction causing severe rash involving mucus membranes or skin necrosis: No Has patient had a PCN reaction that required hospitalization: No Has patient had a PCN reaction occurring within the last 10 years: No If all of the above answers are "NO", then may proceed with Cephalosporin use.   . Clonidine Derivatives Other (See Comments)    Numbness  to tongue face, and arm  . Codeine Itching  . Hydrocodone-Acetaminophen Itching  . Plasticized Base [Plastibase]   . Shellfish Allergy Itching  . Tape   . Sulfa Antibiotics Itching and Rash    Family History  Problem Relation Age of Onset  . Aneurysm Mother   . Diabetes Mother   . Hypertension Mother   . Cancer Neg Hx   . Heart disease Neg  Hx   . Stroke Neg Hx     Prior to Admission medications   Medication Sig Start Date End Date Taking? Authorizing Provider  amLODipine (NORVASC) 10 MG tablet Take 10 mg by mouth daily.   Yes [provider]  aspirin 325 MG tablet Take 1 tablet (325 mg total) by mouth daily. 05/07/17  Yes Isaac Bliss, Rayford Halsted, MD  carvedilol (COREG) 6.25 MG tablet Take 1 tablet (6.25 mg total) by mouth 2 (two) times daily with a meal. 05/06/17  Yes Isaac Bliss, Rayford Halsted, MD  cyanocobalamin 1000 MCG tablet Take 1 tablet (1,000 mcg total) by mouth daily. 06/17/17  Yes Reyne Dumas, MD  doxazosin (CARDURA) 4 MG tablet Take 1 tablet (4 mg total) by mouth daily. 05/09/17  Yes Reyne Dumas, MD  fenofibrate 160 MG  tablet Take 160 mg by mouth daily.   Yes [provider]  fexofenadine (ALLEGRA) 180 MG tablet Take 180 mg by mouth daily.   Yes [provider]  folic acid (FOLVITE) 1 MG tablet Take 1 tablet (1 mg total) by mouth daily. 06/17/17  Yes Reyne Dumas, MD  furosemide (LASIX) 40 MG tablet Take 1 tablet (40 mg total) by mouth 2 (two) times daily. 06/17/17 06/17/18 Yes Reyne Dumas, MD  gabapentin (NEURONTIN) 100 MG capsule Take 200 mg by mouth at bedtime.    Yes [provider]  hydrALAZINE (APRESOLINE) 100 MG tablet Take 1 tablet (100 mg total) by mouth every 8 (eight) hours. 05/08/17  Yes Reyne Dumas, MD  insulin glargine (LANTUS) 100 UNIT/ML injection Inject 0.12 mLs (12 Units total) into the skin daily. 05/09/17  Yes Reyne Dumas, MD  omeprazole (PRILOSEC) 20 MG capsule Take 20 mg by mouth daily.   Yes [provider]  ondansetron (ZOFRAN) 4 MG tablet Take 4 mg by mouth every 8 (eight) hours as needed for nausea or vomiting.   Yes [provider]  potassium chloride SA (K-DUR,KLOR-CON) 20 MEQ tablet Take 2 tablets (40 mEq total) by mouth daily. 06/17/17  Yes Reyne Dumas, MD  sitaGLIPtin (JANUVIA) 25 MG tablet Take 25 mg by mouth daily.   Yes [provider]    Physical Exam: Vitals:   08/28/17 1400 08/28/17 1415 08/28/17 1508 08/28/17 1542  BP: (!) 122/48     Pulse: 63 60    Resp: 18 16    Temp: 99.1 F (37.3 C) 99.1 F (37.3 C)    TempSrc:      SpO2: 95% 96%    Weight:    97.4 kg (214 lb 11.7 oz)  Height:   5\' 6"  (1.676 m)     Constitutional: NAD, calm, comfortable Vitals:   08/28/17 1400 08/28/17 1415 08/28/17 1508 08/28/17 1542  BP: (!) 122/48     Pulse: 63 60    Resp: 18 16    Temp: 99.1 F (37.3 C) 99.1 F (37.3 C)    TempSrc:      SpO2: 95% 96%    Weight:    97.4 kg (214 lb 11.7 oz)  Height:   5\' 6"  (1.676 m)    Eyes: PERRL, lids and conjunctivae normal ENMT: Mucous membranes are moist. Posterior pharynx clear of any  exudate or lesions.Normal dentition.  Neck: normal, supple, no masses, no thyromegaly Respiratory: Crackles at bases bilaterally. Normal respiratory effort. No accessory muscle use.  Cardiovascular: Regular rate and rhythm, no murmurs / rubs / gallops. 2+ extremity edema. 2+ pedal pulses. No carotid bruits.  Abdomen: no tenderness, no  masses palpated. No hepatosplenomegaly. Bowel sounds positive.  Musculoskeletal: no clubbing / cyanosis. No joint deformity upper and lower extremities. Good ROM, no contractures. Normal muscle tone.  Skin: Wounds on feet bilaterally Neurologic: CN 2-12 grossly intact. Sensation grossly intact.  Strength equal bilaterally. Psychiatric: Normal judgment and insight. Alert and oriented x 3. Normal mood.   Labs on Admission: I have personally reviewed following labs and imaging studies  CBC: Recent Labs  Lab 08/28/17 0829  WBC 4.1  NEUTROABS 2.9  HGB 7.8*  HCT 26.8*  MCV 85.4  PLT 382   Basic Metabolic Panel: Recent Labs  Lab 08/28/17 0829  NA 137  K 3.8  CL 103  CO2 26  GLUCOSE 124*  BUN 42*  CREATININE 2.98*  CALCIUM 8.3*   GFR: Estimated Creatinine Clearance: 22.4 mL/min (A) (by C-G formula based on SCr of 2.98 mg/dL (H)). Liver Function Tests: Recent Labs  Lab 08/28/17 0829  AST 41  ALT 14  ALKPHOS 35*  BILITOT 1.2  PROT 6.6  ALBUMIN 2.7*   No results for input(s): LIPASE, AMYLASE in the last 168 hours. No results for input(s): AMMONIA in the last 168 hours. Coagulation Profile: Recent Labs  Lab 08/28/17 0829  INR 1.33   Cardiac Enzymes: No results for input(s): CKTOTAL, CKMB, CKMBINDEX, TROPONINI in the last 168 hours. BNP (last 3 results) No results for input(s): PROBNP in the last 8760 hours. HbA1C: No results for input(s): HGBA1C in the last 72 hours. CBG: Recent Labs  Lab 08/28/17 0843  GLUCAP 122*   Lipid Profile: No results for input(s): CHOL, HDL, LDLCALC, TRIG, CHOLHDL, LDLDIRECT in the last 72  hours. Thyroid Function Tests: No results for input(s): TSH, T4TOTAL, FREET4, T3FREE, THYROIDAB in the last 72 hours. Anemia Panel: No results for input(s): VITAMINB12, FOLATE, FERRITIN, TIBC, IRON, RETICCTPCT in the last 72 hours. Urine analysis:    Component Value Date/Time   COLORURINE YELLOW 08/28/2017 0822   APPEARANCEUR CLOUDY (A) 08/28/2017 0822   LABSPEC 1.009 08/28/2017 0822   PHURINE 6.0 08/28/2017 0822   GLUCOSEU NEGATIVE 08/28/2017 0822   HGBUR SMALL (A) 08/28/2017 0822   BILIRUBINUR NEGATIVE 08/28/2017 0822   KETONESUR NEGATIVE 08/28/2017 0822   PROTEINUR 100 (A) 08/28/2017 0822   UROBILINOGEN 0.2 08/03/2013 0936   NITRITE NEGATIVE 08/28/2017 0822   LEUKOCYTESUR LARGE (A) 08/28/2017 0822    Radiological Exams on Admission: Ct Head Wo Contrast  Result Date: 08/28/2017 CLINICAL DATA:  Unresponsive, fever. EXAM: CT HEAD WITHOUT CONTRAST TECHNIQUE: Contiguous axial images were obtained from the base of the skull through the vertex without intravenous contrast. COMPARISON:  06/10/2017 FINDINGS: Brain: There is atrophy and chronic small vessel disease changes. Old right basal ganglia/internal capsule lacunar infarct. Old right cerebellar infarct. No acute intracranial abnormality. Specifically, no hemorrhage, hydrocephalus, mass lesion, acute infarction, or significant intracranial injury. Vascular: No hyperdense vessel or unexpected calcification. Skull: No acute calvarial abnormality. Sinuses/Orbits: Visualized paranasal sinuses and mastoids clear. Orbital soft tissues unremarkable. Other: None IMPRESSION: Old right basal ganglia/internal capsule lacunar infarct and right cerebellar lacunar infarct. No acute intracranial abnormality. Atrophy, chronic small vessel disease. Electronically Signed   By: Rolm Baptise M.D.   On: 08/28/2017 10:43   Ct Chest Wo Contrast  Addendum Date: 08/28/2017   ADDENDUM REPORT: 08/28/2017 14:55 ADDENDUM: Consider further evaluation with thyroid  ultrasound. If patient is clinically hyperthyroid, consider nuclear medicine thyroid uptake and scan. Electronically Signed   By: San Morelle M.D.   On: 08/28/2017 14:55   Result  Date: 08/28/2017 CLINICAL DATA:  Acute respiratory illness, greater than 73 years old. EXAM: CT CHEST WITHOUT CONTRAST TECHNIQUE: Multidetector CT imaging of the chest was performed following the standard protocol without IV contrast. COMPARISON:  None. FINDINGS: Cardiovascular: The heart is mildly enlarged. Diffuse coronary artery calcifications are present. A small pericardial effusion is present. Atherosclerotic calcifications are present in the aorta and branch vessels. There is mild prominence pulmonary artery is bilaterally. Mediastinum/Nodes: 10 mm right paratracheal lymph node is evident just above the carina. Other subcentimeter mediastinal lymph nodes are present. No definite hilar adenopathy is present. No significant axillary adenopathy is present. A 1.9 cm hypodense nodule is present inferiorly in the left lobe of the thyroid. Lungs/Pleura: Bilateral pleural effusions are present. Diffuse patchy ground-glass attenuation is present in the lungs bilaterally. Consolidated bilateral lower lobe airspace disease is noted bilaterally. Upper Abdomen: The spleen is enlarged. No focal lesions are evident. Musculoskeletal: Vertebral body heights and alignment are maintained. No acute or healing fractures are present. No focal lytic or blastic lesions are present. IMPRESSION: 1. Large bilateral scratched at cardiomegaly with prominent bilateral pleural effusions and diffuse patchy ground-glass attenuation suggesting edema and congestive heart failure. 2. More prominent airspace consolidation at the bases bilaterally likely reflects atelectasis. Infection is not excluded. 3. Splenomegaly of uncertain etiology. 4.  Aortic Atherosclerosis (ICD10-I70.0). Electronically Signed: By: San Morelle M.D. On: 08/28/2017 10:48    Dg Chest Port 1 View  Result Date: 08/28/2017 CLINICAL DATA:  Fever.  Altered mental status. EXAM: PORTABLE CHEST 1 VIEW COMPARISON:  Two-view chest x-ray 06/10/2017 FINDINGS: The heart is enlarged. Aortic atherosclerosis is present. Interstitial edema and bilateral effusions are present. Bibasilar airspace disease is present. IMPRESSION: 1. Congestive heart failure. 2. Bilateral lower lobe airspace disease likely reflects atelectasis. Infection or aspiration is also considered. Electronically Signed   By: San Morelle M.D.   On: 08/28/2017 08:47    EKG: Independently reviewed.  Sinus rhythm with T wave inversions in anterolateral leads.  Assessment/Plan Active Problems:   DM type 2, uncontrolled, with lower extremity ulcer ([place])   AKI (acute kidney injury) ([place])   Acute on chronic diastolic CHF (congestive heart failure) ([facility])   Essential hypertension   Acute cystitis without hematuria   UTI (urinary tract infection)   CKD (chronic kidney disease) stage 4, GFR 15-29 ml/min ([facility])   Acute on chronic respiratory failure with hypoxia ([facility])   Encephalopathy   1. Urinary tract infection.  Patient does not appear septic at this time.  Patient has had multiple urinary tract infections in the recent past.  With the patient on cefepime.  She is tolerated this antibiotic in the past.  Follow-up urine culture. 2. Encephalopathy.  Suspect is related to urinary tract infection.  Check ammonia, ABG.  CT head is unrevealing. 3. Acute on chronic diastolic congestive heart failure.  Husband reports that she has been compliant with Lasix at home.  She does have evidence of pleural effusions and interstitial edema.  Start on IV Lasix and monitor urine output.  She does have some T wave inversions on EKG.  She is not complaining of chest pain.  Cycle cardiac markers. 4. Acute kidney injury on chronic kidney disease stage IV.  Possibly related to low output from CHF.  Continue IV Lasix and monitor  urine output. 5. Acute on chronic respiratory failure.  Normally on 2 L of oxygen.  Currently requiring 4 L.  Suspect is related to decompensated CHF.  Wean down oxygen as tolerated.  6. Diabetes.  Start on sliding scale insulin.  Hold basal insulin until she is eating consistently. 7. Anemia.  No obvious signs of bleeding.  Suspect this is related to chronic disease.  Check anemia panel.  DVT prophylaxis: heparin Code Status: full code Family Communication: discussed with husband over the phone Disposition Plan: pending hospital course, may need placement Consults called:  Admission status: stepdown, inpatient   Kathie Dike MD [facility] Pager [phone]  If 7PM-7AM, please contact night-coverage [email or web address] Password [email or web address]  08/28/2017, 3:50 PM

## 2017-08-28 NOTE — ED Notes (Signed)
Husband at bedside and spoke with EDP. Pt is non- ambulatory and has hosp bed at home. Noted to have excoriated areas under breast and groin area. Dressing not to bilateral feet. Open area noted to left ischium and left upper posterior thigh. Wears chronic O2. At 2 L via [place] at home. Pt is now answering few questions.

## 2017-08-28 NOTE — ED Notes (Signed)
Pt transported to CT ?

## 2017-08-28 NOTE — ED Notes (Signed)
Bilateral feet off loaded on pillow

## 2017-08-29 ENCOUNTER — Other Ambulatory Visit: Payer: Self-pay

## 2017-08-29 DIAGNOSIS — R748 Abnormal levels of other serum enzymes: Secondary | ICD-10-CM

## 2017-08-29 DIAGNOSIS — N179 Acute kidney failure, unspecified: Secondary | ICD-10-CM

## 2017-08-29 DIAGNOSIS — I10 Essential (primary) hypertension: Secondary | ICD-10-CM

## 2017-08-29 LAB — IRON AND TIBC
Iron: 20 ug/dL — ABNORMAL LOW (ref 28–170)
Saturation Ratios: 6 % — ABNORMAL LOW (ref 10.4–31.8)
TIBC: 321 ug/dL (ref 250–450)
UIBC: 301 ug/dL

## 2017-08-29 LAB — LIPID PANEL
Cholesterol: 119 mg/dL (ref 0–200)
HDL: 11 mg/dL — AB (ref >40)
LDL Cholesterol: 70 mg/dL (ref 0–99)
TRIGLYCERIDES: 192 mg/dL — AB (ref <150)
Total CHOL/HDL Ratio: 10.8 RATIO
VLDL: 38 mg/dL (ref 0–40)

## 2017-08-29 LAB — HEPARIN LEVEL (UNFRACTIONATED)
Heparin Unfractionated: <0.1 IU/mL — ABNORMAL LOW (ref 0.30–0.70)
Heparin Unfractionated: 0.12 IU/mL — ABNORMAL LOW (ref 0.30–0.70)
Heparin Unfractionated: <0.1 IU/mL — ABNORMAL LOW (ref 0.30–0.70)

## 2017-08-29 LAB — CBC
HEMATOCRIT: 23.5 % — AB (ref 36.0–46.0)
HEMOGLOBIN: 7 g/dL — AB (ref 12.0–15.0)
MCH: 24.7 pg — ABNORMAL LOW (ref 26.0–34.0)
MCHC: 29.8 g/dL — ABNORMAL LOW (ref 30.0–36.0)
MCV: 83 fL (ref 78.0–100.0)
Platelets: 154 10*3/uL (ref 150–400)
RBC: 2.83 MIL/uL — AB (ref 3.87–5.11)
RDW: 16.7 % — ABNORMAL HIGH (ref 11.5–15.5)
WBC: 4 10*3/uL (ref 4.0–10.5)

## 2017-08-29 LAB — HEMOGLOBIN AND HEMATOCRIT, BLOOD
HCT: 26.7 % — ABNORMAL LOW (ref 36.0–46.0)
HEMATOCRIT: 23.8 % — AB (ref 36.0–46.0)
Hemoglobin: 7.2 g/dL — ABNORMAL LOW (ref 12.0–15.0)
Hemoglobin: 8.3 g/dL — ABNORMAL LOW (ref 12.0–15.0)

## 2017-08-29 LAB — FOLATE: Folate: 20.5 ng/mL (ref >5.9)

## 2017-08-29 LAB — HEMOGLOBIN A1C
Hgb A1c MFr Bld: 4.9 % (ref 4.8–5.6)
Mean Plasma Glucose: 93.93 mg/dL

## 2017-08-29 LAB — FERRITIN: FERRITIN: 38 ng/mL (ref 11–307)

## 2017-08-29 LAB — TROPONIN I: TROPONIN I: 0.44 ng/mL — AB (ref <0.03)

## 2017-08-29 LAB — ABO/RH: ABO/RH(D): O POS

## 2017-08-29 LAB — GLUCOSE, CAPILLARY
GLUCOSE-CAPILLARY: 80 mg/dL (ref 65–99)
Glucose-Capillary: 124 mg/dL — ABNORMAL HIGH (ref 65–99)
Glucose-Capillary: 140 mg/dL — ABNORMAL HIGH (ref 65–99)

## 2017-08-29 LAB — VITAMIN B12: Vitamin B-12: 1215 pg/mL — ABNORMAL HIGH (ref 180–914)

## 2017-08-29 LAB — PREPARE RBC (CROSSMATCH)

## 2017-08-29 MED ORDER — SODIUM CHLORIDE 0.9 % IV SOLN
Freq: Once | INTRAVENOUS | Status: DC
Start: 2017-08-29 — End: 2017-09-02

## 2017-08-29 MED ORDER — FUROSEMIDE 10 MG/ML IJ SOLN
80.0000 mg | Freq: Two times a day (BID) | INTRAMUSCULAR | Status: DC
  Administered 2017-08-29 – 2017-09-03 (×10): 80 mg via INTRAVENOUS
  Filled 2017-08-29 (×10): qty 8

## 2017-08-29 MED ORDER — HEPARIN BOLUS VIA INFUSION
2000.0000 [IU] | Freq: Once | INTRAVENOUS | Status: AC
  Administered 2017-08-29: 2000 [IU] via INTRAVENOUS
  Filled 2017-08-29: qty 2000

## 2017-08-29 MED ORDER — INSULIN GLARGINE 100 UNIT/ML ~~LOC~~ SOLN
10.0000 [IU] | Freq: Every day | SUBCUTANEOUS | Status: DC
  Administered 2017-08-30 – 2017-09-04 (×6): 10 [IU] via SUBCUTANEOUS
  Filled 2017-08-29 (×7): qty 0.1

## 2017-08-29 MED ORDER — ASPIRIN 81 MG PO CHEW
81.0000 mg | CHEWABLE_TABLET | Freq: Every day | ORAL | Status: DC
  Administered 2017-08-29 – 2017-09-02 (×5): 81 mg via ORAL
  Filled 2017-08-29 (×5): qty 1

## 2017-08-29 NOTE — Progress Notes (Signed)
ANTICOAGULATION CONSULT NOTE - Follow Up Consult  Pharmacy Consult for heparin Indication: chest pain/ACS  Labs: Recent Labs    08/28/17 0829 08/28/17 1639 08/29/17 0037 08/29/17 0220  HGB 7.8*  --   --  7.0*  HCT 26.8*  --   --  23.5*  PLT 155  --   --  154  LABPROT 16.4*  --   --   --   INR 1.33  --   --   --   HEPARINUNFRC  --   --   --  <0.10*  CREATININE 2.98*  --   --   --   TROPONINI  --  1.05* 0.44*  --     Assessment: 66yo female undetectable on heparin with initial dosing for CP.  Goal of Therapy:  Heparin level 0.3-0.7 units/ml   Plan:  Will rebolus with heparin 2000 units and increase gtt by 4 units/kg/hr to 1100 units/hr and check level in [place], PharmD, BCPS  08/29/2017,3:26 AM

## 2017-08-29 NOTE — Progress Notes (Signed)
ANTICOAGULATION CONSULT NOTE - Follow Up Consult  Pharmacy Consult for Heparin Indication: chest pain/ACS  Patient Measurements: Height: 5\' 6"  (167.6 cm) Weight: 218 lb 11.1 oz (99.2 kg) IBW/kg (Calculated) : 59.3 Heparin Dosing Weight: 82 kg  Vital Signs: Temp: 98.7 F (37.1 C) (11/18 2102) Temp Source: Oral (11/18 2102) BP: 166/54 (11/18 2102) Pulse Rate: 71 (11/18 2102)  Labs: Recent Labs    08/28/17 0829 08/28/17 1639 08/29/17 0037 08/29/17 0220 08/29/17 0834 08/29/17 1037 08/29/17 1901 08/29/17 2041  HGB 7.8*  --   --  7.0* 7.2*  --  8.3*  --   HCT 26.8*  --   --  23.5* 23.8*  --  26.7*  --   PLT 155  --   --  154  --   --   --   --   LABPROT 16.4*  --   --   --   --   --   --   --   INR 1.33  --   --   --   --   --   --   --   HEPARINUNFRC  --   --   --  <0.10*  --  0.12*  --  <0.10*  CREATININE 2.98*  --   --   --   --   --   --   --   TROPONINI  --  1.05* 0.44*  --   --   --   --   --     Estimated Creatinine Clearance: 22.7 mL/min (A) (by C-G formula based on SCr of 2.98 mg/dL (H)).   Medications:  Infusions:  . sodium chloride    . sodium chloride    . ceFEPime (MAXIPIME) IV 1 g (08/29/17 2133)  . heparin 1,300 Units/hr (08/29/17 1632)    Assessment: 66yo PMHx HFpEF, DM2, HTN, and prior stroke transferred from [facility] with HF exacerbation. Pharmacy consulted to dose heparin for chest pain.  Heparin level this evening remains SUBtherapeutic despite a rate increase earlier today (HL<0.1, goal of 0.3-0.7). Hgb up to 8.3 << 7.2 s/p 1 unit PRBC. Per RN report Heparin was off for 1 hour right before the labs were drawn - so this level tonight is inaccurate.   Goal of Therapy:  Heparin level 0.3-0.7 units/ml Monitor platelets by anticoagulation protocol: Yes   Plan:  1. Increase Heparin drip slightly to 1400 units/hr (14 ml/hr) 2. Will continue to monitor for any signs/symptoms of bleeding and will follow up with heparin level in 8 hours   Thank  you for allowing pharmacy to be a part of this patient's care.  Alycia Rossetti, PharmD, BCPS Clinical Pharmacist Pager: [phone] If after 3:30p, please call main pharmacy at: [phone] 08/29/2017 10:08 PM

## 2017-08-29 NOTE — Progress Notes (Signed)
ANTICOAGULATION CONSULT NOTE - Follow Up Consult  Pharmacy Consult for heparin Indication: chest pain/ACS  Labs: Recent Labs    08/28/17 0829 08/28/17 1639 08/29/17 0037 08/29/17 0220 08/29/17 0834 08/29/17 1037  HGB 7.8*  --   --  7.0* 7.2*  --   HCT 26.8*  --   --  23.5* 23.8*  --   PLT 155  --   --  154  --   --   LABPROT 16.4*  --   --   --   --   --   INR 1.33  --   --   --   --   --   HEPARINUNFRC  --   --   --  <0.10*  --  0.12*  CREATININE 2.98*  --   --   --   --   --   TROPONINI  --  1.05* 0.44*  --   --   --     Assessment: 66yo PMHx HFpEF, DM2, HTN, and prior stroke transferred from [facility] with HF exacerbation. Pharmacy consulted to dose heparin for chest pain, and Initial heparin level undetectable.  8 hour heparin level subtherapeutic. No bleeding documented, but Hgb low - patient transfused with goal to keep Hgb >8 per MD. SCr elevated.  Goal of Therapy:  Heparin level 0.3-0.7 units/ml   Plan:  Increase heparin drip to 1300 units/hr 8 hour heparin level Daily heparin level, CBC Monitor clinical course, s/sx bleeding  Nida Boatman, PharmD PGY1 Acute Care Pharmacy Resident Pager: [phone] 08/29/2017,12:22 PM

## 2017-08-29 NOTE — Progress Notes (Addendum)
Progress Note  Patient Name: Madison Reed Date of Encounter: 08/29/2017  Primary Cardiologist: New  Subjective   Remains encephalopathic - will not communicate  Inpatient Medications    Scheduled Meds: . aspirin  81 mg Oral Daily  . carvedilol  6.25 mg Oral BID WC  . furosemide  40 mg Intravenous BID  . insulin aspart  0-15 Units Subcutaneous TID WC  . insulin aspart  0-5 Units Subcutaneous QHS  . pantoprazole  40 mg Oral Daily  . sodium chloride flush  3 mL Intravenous Q12H   Continuous Infusions: . sodium chloride    . ceFEPime (MAXIPIME) IV Stopped (08/28/17 1726)  . heparin 1,100 Units/hr (08/29/17 0333)   PRN Meds: sodium chloride, acetaminophen, ondansetron (ZOFRAN) IV, sodium chloride flush   Vital Signs    Vitals:   08/28/17 1800 08/28/17 1900 08/28/17 2116 08/29/17 0444  BP: (!) 130/55  (!) 133/52 112/71  Pulse: 64  64 68  Resp: 18  18 18   Temp:  98.3 F (36.8 C) 98.2 F (36.8 C) 98.2 F (36.8 C)  TempSrc:  Oral Oral Oral  SpO2: 96%  97% 97%  Weight:    218 lb 11.1 oz (99.2 kg)  Height:        Intake/Output Summary (Last 24 hours) at 08/29/2017 1102 Last data filed at 08/29/2017 0100 Gross per 24 hour  Intake 87.45 ml  Output 1450 ml  Net -1362.55 ml   Filed Weights   08/28/17 0812 08/28/17 1542 08/29/17 0444  Weight: 166 lb (75.3 kg) 214 lb 11.7 oz (97.4 kg) 218 lb 11.1 oz (99.2 kg)    Telemetry    NSR - Personally Reviewed  ECG    No new EKG to review - Personally Reviewed  Physical Exam   GEN: No acute distress.   Neck: No JVD Cardiac: RRR, no murmurs, rubs, or gallops.  Respiratory: Clear to auscultation bilaterally. GI: Soft, nontender, non-distended  MS: marked LE edema Neuro:  Nonfocal  Psych: Normal affect   Labs    Chemistry Recent Labs  Lab 08/28/17 0829  NA 137  K 3.8  CL 103  CO2 26  GLUCOSE 124*  BUN 42*  CREATININE 2.98*  CALCIUM 8.3*  PROT 6.6  ALBUMIN 2.7*  AST 41  ALT 14  ALKPHOS 35*    BILITOT 1.2  GFRNONAA 16*  GFRAA 18*  ANIONGAP 8     Hematology Recent Labs  Lab 08/28/17 0829 08/28/17 1639 08/29/17 0220 08/29/17 0834  WBC 4.1  --  4.0  --   RBC 3.14* 2.75* 2.83*  --   HGB 7.8*  --  7.0* 7.2*  HCT 26.8*  --  23.5* 23.8*  MCV 85.4  --  83.0  --   MCH 24.8*  --  24.7*  --   MCHC 29.1*  --  29.8*  --   RDW 16.6*  --  16.7*  --   PLT 155  --  154  --     Cardiac Enzymes Recent Labs  Lab 08/28/17 1639 08/29/17 0037  TROPONINI 1.05* 0.44*   No results for input(s): TROPIPOC in the last 168 hours.   BNP Recent Labs  Lab 08/28/17 0829  BNP 1,330.0*     DDimer No results for input(s): DDIMER in the last 168 hours.   Radiology    Ct Head Wo Contrast  Result Date: 08/28/2017 CLINICAL DATA:  Unresponsive, fever. EXAM: CT HEAD WITHOUT CONTRAST TECHNIQUE: Contiguous axial images were obtained from the  base of the skull through the vertex without intravenous contrast. COMPARISON:  06/10/2017 FINDINGS: Brain: There is atrophy and chronic small vessel disease changes. Old right basal ganglia/internal capsule lacunar infarct. Old right cerebellar infarct. No acute intracranial abnormality. Specifically, no hemorrhage, hydrocephalus, mass lesion, acute infarction, or significant intracranial injury. Vascular: No hyperdense vessel or unexpected calcification. Skull: No acute calvarial abnormality. Sinuses/Orbits: Visualized paranasal sinuses and mastoids clear. Orbital soft tissues unremarkable. Other: None IMPRESSION: Old right basal ganglia/internal capsule lacunar infarct and right cerebellar lacunar infarct. No acute intracranial abnormality. Atrophy, chronic small vessel disease. Electronically Signed   By: Rolm Baptise M.D.   On: 08/28/2017 10:43   Ct Chest Wo Contrast  Addendum Date: 08/28/2017   ADDENDUM REPORT: 08/28/2017 14:55 ADDENDUM: Consider further evaluation with thyroid ultrasound. If patient is clinically hyperthyroid, consider nuclear medicine  thyroid uptake and scan. Electronically Signed   By: San Morelle M.D.   On: 08/28/2017 14:55   Result Date: 08/28/2017 CLINICAL DATA:  Acute respiratory illness, greater than 74 years old. EXAM: CT CHEST WITHOUT CONTRAST TECHNIQUE: Multidetector CT imaging of the chest was performed following the standard protocol without IV contrast. COMPARISON:  None. FINDINGS: Cardiovascular: The heart is mildly enlarged. Diffuse coronary artery calcifications are present. A small pericardial effusion is present. Atherosclerotic calcifications are present in the aorta and branch vessels. There is mild prominence pulmonary artery is bilaterally. Mediastinum/Nodes: 10 mm right paratracheal lymph node is evident just above the carina. Other subcentimeter mediastinal lymph nodes are present. No definite hilar adenopathy is present. No significant axillary adenopathy is present. A 1.9 cm hypodense nodule is present inferiorly in the left lobe of the thyroid. Lungs/Pleura: Bilateral pleural effusions are present. Diffuse patchy ground-glass attenuation is present in the lungs bilaterally. Consolidated bilateral lower lobe airspace disease is noted bilaterally. Upper Abdomen: The spleen is enlarged. No focal lesions are evident. Musculoskeletal: Vertebral body heights and alignment are maintained. No acute or healing fractures are present. No focal lytic or blastic lesions are present. IMPRESSION: 1. Large bilateral scratched at cardiomegaly with prominent bilateral pleural effusions and diffuse patchy ground-glass attenuation suggesting edema and congestive heart failure. 2. More prominent airspace consolidation at the bases bilaterally likely reflects atelectasis. Infection is not excluded. 3. Splenomegaly of uncertain etiology. 4.  Aortic Atherosclerosis (ICD10-I70.0). Electronically Signed: By: San Morelle M.D. On: 08/28/2017 10:48   Dg Chest Port 1 View  Result Date: 08/28/2017 CLINICAL DATA:  Fever.   Altered mental status. EXAM: PORTABLE CHEST 1 VIEW COMPARISON:  Two-view chest x-ray 06/10/2017 FINDINGS: The heart is enlarged. Aortic atherosclerosis is present. Interstitial edema and bilateral effusions are present. Bibasilar airspace disease is present. IMPRESSION: 1. Congestive heart failure. 2. Bilateral lower lobe airspace disease likely reflects atelectasis. Infection or aspiration is also considered. Electronically Signed   By: San Morelle M.D.   On: 08/28/2017 08:47    Cardiac Studies   2D echo Study Conclusions  - Left ventricle: The cavity size was normal. Wall thickness was   increased increased in a pattern of mild to moderate LVH.   Systolic function was normal. The estimated ejection fraction was   in the range of 60% to 65%. Wall motion was normal; there were no   regional wall motion abnormalities. Features are consistent with   a pseudonormal left ventricular filling pattern, with concomitant   abnormal relaxation and increased filling pressure (grade 2   diastolic dysfunction). - Mitral valve: There was mild regurgitation. - Right atrium: Central venous pressure (est):  8 mm Hg. - Atrial septum: The septum bowed from left to right, consistent   with increased left atrial pressure. No defect or patent foramen   ovale was identified. - Tricuspid valve: There was trivial regurgitation. - Pulmonary arteries: Systolic pressure could not be accurately   estimated. - Pericardium, extracardiac: Small to moderate circumferential   pericardial effusion with evidence of organization and possible   chronicity. There was a possible small right pleural effusion.  Impressions:  - Mild to moderate LVH with LVEF 60-65% and grade 2 diastolic   dysfunction. Mild mitral regurgitation. Trivial tricuspid   regurgitation. Bowing of the interatrial septum from left to   right without obvious ASD or PFO. Small to moderate   circumferential pericardial effusion with evidence  of   organization and possible chronicity. Possible small right   pleural effusion.  Patient Profile     66 y.o. female with a hx of HFpEF, DM2, HTN, prior stroke who is transferred to [facility] from [facility], where she presented with heart failure exacerbation. Cardiology was consulted for positive troponin  Assessment & Plan    1. Acute on chronic diastolic CHF - Cxray c/w CHF and BNP elevated at 130 - she put out 1.45L yesterday and is net neg 1.36L since admit. - weight up 4lbs today ? Accuracy (214>218). - she has been compliant with diuretics at home - creatinine elevated at 2.98 on admit and likely related to venous congestion - will likely improve with diuresis.  BMET pending this am - she continues to appear markedly volume overloaded on exam so will increase Lasix to 80mg  IV BID - follow daily weight and I&O's  2.  Positive troponin  - trop elevated at 1.05>0.44 - EKG with no acute ST changes - nonspecific ST abnormality present - likely related to demand ischemia in the setting of AMS, UTI and CHF exacerbation in the setting of acute on CKD. - recheck 2D echo to assess LVF and rule out new wall motion abnormality - continue IV heparin gtt - will need ischemic eval once UTI and CHF have resolved  3.  HTN - BP well controlled on exam today - continue carvedilol 6.25mg  BID  4.  UTI - per TRH  5.  Acute on chronic kidney disease stage IV - creatinine elevated on admit likely related to acute venous congestion - continue to monitor creatinine with diuretics   For questions or updates, please contact [place] Please consult [email or web address] for contact info under Cardiology/STEMI.      Signed, Fransico Him, MD  08/29/2017, 11:02 AM

## 2017-08-29 NOTE — Progress Notes (Addendum)
PROGRESS NOTE    Madison Reed  HEN:[id number] DOB: Dec 16, 1950 DOA: 08/28/2017 PCP: Celene Squibb, MD   Brief Narrative: Madison Reed is a 66 y.o. female with medical history significant of diastolic heart failure, diabetes, chronic respiratory failure on home oxygen. Patient presented with CHF exacerbation and possible UTI and was encephalopathy. Currently being diuresed and empiric treatment with Vancomycin and Cefepime.   Assessment & Plan:   Active Problems:   DM type 2, uncontrolled, with lower extremity ulcer ([place])   AKI (acute kidney injury) ([place])   Acute on chronic diastolic CHF (congestive heart failure) (HCC)   Troponin level elevated   Essential hypertension   Acute cystitis without hematuria   UTI (urinary tract infection)   CKD (chronic kidney disease) stage 4, GFR 15-29 ml/min (HCC)   Acute on chronic respiratory failure with hypoxia (HCC)   Encephalopathy   Anemia   NSTEMI (non-ST elevated myocardial infarction) (HCC)   Urinary tract infection Urine culture pending -Continue Vancomycin and Cefepime  Encephalopathy Presumed metabolic secondary to urinary tract infection. ABG and ammonia unremarkable. Appears improved. -continue to monitor  Acute on chronic diastolic CHF Hypervolemic on exam. Pleural effusions and interstitial edema on chest x-ray. -Cardiology recommendations: continue diuresis -Foley catheter secondary to aggressive diuresis  Acute kidney injury on CKD stage IV Baseline of 1.7. Up to 2.98 on admission. Likely secondary to cardiorenal disease -diuresis as above -repeat BMP  Acute on chronic respiratory failure Per notes, states patient on 2 L chronically, but patient tells me 4 L chronically. Currently on 4 L and stable.  Diabetes mellitus -restart home Lantus at 20% dose of 10 units. -SSI -Heart healthy/carb modified diet  Anemia Downtrended from admission. -Will transfuse to keep hemoglobin >8 -Post-transfusion  H&H  Elevated troponin In setting of CHF exacerbation. -Cardiology recommendations   DVT prophylaxis: Heparin Code Status: Full code Family Communication: None at bedside Disposition Plan: Pending cardiac workup   Consultants:   Cardiology  Procedures:   None  Antimicrobials:  Vancomycin (11/17>>  Cefepime (11/17>>  Levaquin (11/17)  Aztreonam (11/17)   Subjective: No chest pain or dyspnea. Wants to eat.  Objective: Vitals:   08/28/17 1800 08/28/17 1900 08/28/17 2116 08/29/17 0444  BP: (!) 130/55  (!) 133/52 112/71  Pulse: 64  64 68  Resp: 18  18 18   Temp:  98.3 F (36.8 C) 98.2 F (36.8 C) 98.2 F (36.8 C)  TempSrc:  Oral Oral Oral  SpO2: 96%  97% 97%  Weight:    99.2 kg (218 lb 11.1 oz)  Height:        Intake/Output Summary (Last 24 hours) at 08/29/2017 1124 Last data filed at 08/29/2017 0100 Gross per 24 hour  Intake 87.45 ml  Output 1450 ml  Net -1362.55 ml   Filed Weights   08/28/17 0812 08/28/17 1542 08/29/17 0444  Weight: 75.3 kg (166 lb) 97.4 kg (214 lb 11.7 oz) 99.2 kg (218 lb 11.1 oz)    Examination:  General exam: Appears calm and comfortable Respiratory system: Clear to auscultation. Respiratory effort normal. Cardiovascular system: S1 & S2 heard, RRR. No murmurs, rubs, gallops or clicks. Gastrointestinal system: Abdomen is nondistended, soft and nontender. Normal bowel sounds heard. Central nervous system: Alert and oriented. No focal neurological deficits. Extremities: 2+ edema. No calf tenderness Skin: No cyanosis. No rashes Psychiatry: Judgement and insight appear normal. Mood & affect appropriate.     Data Reviewed: I have personally reviewed following labs and imaging studies  CBC: Recent Labs  Lab 08/28/17 0829 08/29/17 0220 08/29/17 0834  WBC 4.1 4.0  --   NEUTROABS 2.9  --   --   HGB 7.8* 7.0* 7.2*  HCT 26.8* 23.5* 23.8*  MCV 85.4 83.0  --   PLT 155 154  --    Basic Metabolic Panel: Recent Labs  Lab  08/28/17 0829  NA 137  K 3.8  CL 103  CO2 26  GLUCOSE 124*  BUN 42*  CREATININE 2.98*  CALCIUM 8.3*   GFR: Estimated Creatinine Clearance: 22.7 mL/min (A) (by C-G formula based on SCr of 2.98 mg/dL (H)). Liver Function Tests: Recent Labs  Lab 08/28/17 0829  AST 41  ALT 14  ALKPHOS 35*  BILITOT 1.2  PROT 6.6  ALBUMIN 2.7*   No results for input(s): LIPASE, AMYLASE in the last 168 hours. Recent Labs  Lab 08/28/17 1639  AMMONIA 17   Coagulation Profile: Recent Labs  Lab 08/28/17 0829  INR 1.33   Cardiac Enzymes: Recent Labs  Lab 08/28/17 1639 08/29/17 0037  TROPONINI 1.05* 0.44*   BNP (last 3 results) No results for input(s): PROBNP in the last 8760 hours. HbA1C: Recent Labs    08/29/17 0220  HGBA1C 4.9   CBG: Recent Labs  Lab 08/28/17 0843 08/28/17 2244 08/29/17 0728  GLUCAP 122* 96 80   Lipid Profile: Recent Labs    08/29/17 0220  CHOL 119  HDL 11*  LDLCALC 70  TRIG 192*  CHOLHDL 10.8   Thyroid Function Tests: No results for input(s): TSH, T4TOTAL, FREET4, T3FREE, THYROIDAB in the last 72 hours. Anemia Panel: Recent Labs    08/28/17 1639  RETICCTPCT 2.4   Sepsis Labs: Recent Labs  Lab 08/28/17 0840 08/28/17 1137  LATICACIDVEN 0.63 0.45*    Recent Results (from the past 240 hour(s))  Blood Culture (routine x 2)     Status: None (Preliminary result)   Collection Time: 08/28/17  8:28 AM  Result Value Ref Range Status   Specimen Description BLOOD RIGHT FOREARM  Final   Special Requests   Final    BOTTLES DRAWN AEROBIC AND ANAEROBIC Blood Culture results may not be optimal due to an inadequate volume of blood received in culture bottles   Culture NO GROWTH < 24 HOURS  Final   Report Status PENDING  Incomplete  Blood Culture (routine x 2)     Status: None (Preliminary result)   Collection Time: 08/28/17  8:49 AM  Result Value Ref Range Status   Specimen Description BLOOD  Final   Special Requests NONE  Final   Culture NO  GROWTH < 24 HOURS  Final   Report Status PENDING  Incomplete         Radiology Studies: Ct Head Wo Contrast  Result Date: 08/28/2017 CLINICAL DATA:  Unresponsive, fever. EXAM: CT HEAD WITHOUT CONTRAST TECHNIQUE: Contiguous axial images were obtained from the base of the skull through the vertex without intravenous contrast. COMPARISON:  06/10/2017 FINDINGS: Brain: There is atrophy and chronic small vessel disease changes. Old right basal ganglia/internal capsule lacunar infarct. Old right cerebellar infarct. No acute intracranial abnormality. Specifically, no hemorrhage, hydrocephalus, mass lesion, acute infarction, or significant intracranial injury. Vascular: No hyperdense vessel or unexpected calcification. Skull: No acute calvarial abnormality. Sinuses/Orbits: Visualized paranasal sinuses and mastoids clear. Orbital soft tissues unremarkable. Other: None IMPRESSION: Old right basal ganglia/internal capsule lacunar infarct and right cerebellar lacunar infarct. No acute intracranial abnormality. Atrophy, chronic small vessel disease. Electronically Signed   By: Lennette Bihari  [place] M.D.   On: 08/28/2017 10:43   Ct Chest Wo Contrast  Addendum Date: 08/28/2017   ADDENDUM REPORT: 08/28/2017 14:55 ADDENDUM: Consider further evaluation with thyroid ultrasound. If patient is clinically hyperthyroid, consider nuclear medicine thyroid uptake and scan. Electronically Signed   By: San Morelle M.D.   On: 08/28/2017 14:55   Result Date: 08/28/2017 CLINICAL DATA:  Acute respiratory illness, greater than 27 years old. EXAM: CT CHEST WITHOUT CONTRAST TECHNIQUE: Multidetector CT imaging of the chest was performed following the standard protocol without IV contrast. COMPARISON:  None. FINDINGS: Cardiovascular: The heart is mildly enlarged. Diffuse coronary artery calcifications are present. A small pericardial effusion is present. Atherosclerotic calcifications are present in the aorta and branch vessels.  There is mild prominence pulmonary artery is bilaterally. Mediastinum/Nodes: 10 mm right paratracheal lymph node is evident just above the carina. Other subcentimeter mediastinal lymph nodes are present. No definite hilar adenopathy is present. No significant axillary adenopathy is present. A 1.9 cm hypodense nodule is present inferiorly in the left lobe of the thyroid. Lungs/Pleura: Bilateral pleural effusions are present. Diffuse patchy ground-glass attenuation is present in the lungs bilaterally. Consolidated bilateral lower lobe airspace disease is noted bilaterally. Upper Abdomen: The spleen is enlarged. No focal lesions are evident. Musculoskeletal: Vertebral body heights and alignment are maintained. No acute or healing fractures are present. No focal lytic or blastic lesions are present. IMPRESSION: 1. Large bilateral scratched at cardiomegaly with prominent bilateral pleural effusions and diffuse patchy ground-glass attenuation suggesting edema and congestive heart failure. 2. More prominent airspace consolidation at the bases bilaterally likely reflects atelectasis. Infection is not excluded. 3. Splenomegaly of uncertain etiology. 4.  Aortic Atherosclerosis (ICD10-I70.0). Electronically Signed: By: San Morelle M.D. On: 08/28/2017 10:48   Dg Chest Port 1 View  Result Date: 08/28/2017 CLINICAL DATA:  Fever.  Altered mental status. EXAM: PORTABLE CHEST 1 VIEW COMPARISON:  Two-view chest x-ray 06/10/2017 FINDINGS: The heart is enlarged. Aortic atherosclerosis is present. Interstitial edema and bilateral effusions are present. Bibasilar airspace disease is present. IMPRESSION: 1. Congestive heart failure. 2. Bilateral lower lobe airspace disease likely reflects atelectasis. Infection or aspiration is also considered. Electronically Signed   By: San Morelle M.D.   On: 08/28/2017 08:47        Scheduled Meds: . aspirin  81 mg Oral Daily  . carvedilol  6.25 mg Oral BID WC  .  furosemide  80 mg Intravenous BID  . insulin aspart  0-15 Units Subcutaneous TID WC  . insulin aspart  0-5 Units Subcutaneous QHS  . pantoprazole  40 mg Oral Daily  . sodium chloride flush  3 mL Intravenous Q12H   Continuous Infusions: . sodium chloride    . ceFEPime (MAXIPIME) IV Stopped (08/28/17 1726)  . heparin 1,100 Units/hr (08/29/17 0333)     LOS: 1 day     Cordelia Poche, MD [facility] 08/29/2017, 11:24 AM Pager: [phone]  If 7PM-7AM, please contact night-coverage [email or web address] Password [email or web address] 08/29/2017, 11:24 AM

## 2017-08-30 ENCOUNTER — Inpatient Hospital Stay (HOSPITAL_COMMUNITY): Payer: Medicare Other

## 2017-08-30 DIAGNOSIS — R778 Other specified abnormalities of plasma proteins: Secondary | ICD-10-CM

## 2017-08-30 DIAGNOSIS — I340 Nonrheumatic mitral (valve) insufficiency: Secondary | ICD-10-CM

## 2017-08-30 DIAGNOSIS — R7989 Other specified abnormal findings of blood chemistry: Secondary | ICD-10-CM

## 2017-08-30 LAB — BPAM RBC
BLOOD PRODUCT EXPIRATION DATE: 201812062359
ISSUE DATE / TIME: 201811181210
UNIT TYPE AND RH: 5100

## 2017-08-30 LAB — ECHOCARDIOGRAM COMPLETE
HEIGHTINCHES: 66 in
Weight: 3548.52 oz

## 2017-08-30 LAB — GLUCOSE, CAPILLARY
GLUCOSE-CAPILLARY: 94 mg/dL (ref 65–99)
GLUCOSE-CAPILLARY: 110 mg/dL — AB (ref 65–99)
Glucose-Capillary: 108 mg/dL — ABNORMAL HIGH (ref 65–99)
Glucose-Capillary: 107 mg/dL — ABNORMAL HIGH (ref 65–99)
Glucose-Capillary: 109 mg/dL — ABNORMAL HIGH (ref 65–99)

## 2017-08-30 LAB — BASIC METABOLIC PANEL
Anion gap: 6 (ref 5–15)
BUN: 27 mg/dL — AB (ref 6–20)
CO2: 28 mmol/L (ref 22–32)
Calcium: 7.8 mg/dL — ABNORMAL LOW (ref 8.9–10.3)
Chloride: 104 mmol/L (ref 101–111)
Creatinine, Ser: 2.2 mg/dL — ABNORMAL HIGH (ref 0.44–1.00)
GFR calc Af Amer: 26 mL/min — ABNORMAL LOW (ref >60)
GFR, EST NON AFRICAN AMERICAN: 22 mL/min — AB (ref >60)
Glucose, Bld: 99 mg/dL (ref 65–99)
POTASSIUM: 2.6 mmol/L — AB (ref 3.5–5.1)
SODIUM: 138 mmol/L (ref 135–145)

## 2017-08-30 LAB — TYPE AND SCREEN
ABO/RH(D): O POS
Antibody Screen: NEGATIVE
Unit division: 0

## 2017-08-30 LAB — CBC
HEMATOCRIT: 27.8 % — AB (ref 36.0–46.0)
Hemoglobin: 8.4 g/dL — ABNORMAL LOW (ref 12.0–15.0)
MCH: 25.1 pg — ABNORMAL LOW (ref 26.0–34.0)
MCHC: 30.2 g/dL (ref 30.0–36.0)
MCV: 83.2 fL (ref 78.0–100.0)
PLATELETS: 150 10*3/uL (ref 150–400)
RBC: 3.34 MIL/uL — ABNORMAL LOW (ref 3.87–5.11)
RDW: 16.5 % — AB (ref 11.5–15.5)
WBC: 4.3 10*3/uL (ref 4.0–10.5)

## 2017-08-30 LAB — HEPARIN LEVEL (UNFRACTIONATED)
HEPARIN UNFRACTIONATED: 0.32 [IU]/mL (ref 0.30–0.70)
Heparin Unfractionated: 0.17 IU/mL — ABNORMAL LOW (ref 0.30–0.70)

## 2017-08-30 MED ORDER — DEXTROSE 5 % IV SOLN
1.0000 g | INTRAVENOUS | Status: DC
  Administered 2017-08-30 – 2017-09-02 (×4): 1 g via INTRAVENOUS
  Filled 2017-08-30 (×7): qty 10

## 2017-08-30 MED ORDER — POTASSIUM CHLORIDE CRYS ER 20 MEQ PO TBCR
40.0000 meq | EXTENDED_RELEASE_TABLET | ORAL | Status: AC
  Administered 2017-08-30 (×2): 40 meq via ORAL
  Filled 2017-08-30 (×2): qty 2

## 2017-08-30 MED ORDER — HEPARIN BOLUS VIA INFUSION
2000.0000 [IU] | Freq: Once | INTRAVENOUS | Status: AC
  Administered 2017-08-30: 2000 [IU] via INTRAVENOUS
  Filled 2017-08-30: qty 2000

## 2017-08-30 NOTE — Progress Notes (Signed)
Progress Note  Patient Name: Madison Reed Date of Encounter: 08/30/2017  Primary Cardiologist: Radford Pax  Subjective   She is talking today and denies chest pain or dyspnea.   Inpatient Medications    Scheduled Meds: . aspirin  81 mg Oral Daily  . carvedilol  6.25 mg Oral BID WC  . furosemide  80 mg Intravenous BID  . heparin  2,000 Units Intravenous Once  . insulin aspart  0-15 Units Subcutaneous TID WC  . insulin aspart  0-5 Units Subcutaneous QHS  . insulin glargine  10 Units Subcutaneous Daily  . pantoprazole  40 mg Oral Daily  . potassium chloride  40 mEq Oral Q4H  . sodium chloride flush  3 mL Intravenous Q12H   Continuous Infusions: . sodium chloride    . sodium chloride    . ceFEPime (MAXIPIME) IV Stopped (08/29/17 2237)  . heparin 1,400 Units/hr (08/29/17 2215)   PRN Meds: sodium chloride, acetaminophen, ondansetron (ZOFRAN) IV, sodium chloride flush   Vital Signs    Vitals:   08/29/17 1327 08/29/17 1544 08/29/17 2102 08/30/17 0419  BP: (!) 153/55 (!) 157/56 (!) 166/54 (!) 141/54  Pulse:  73 71 70  Resp:  (!) 24 (!) 23 18  Temp: 98 F (36.7 C) 98.1 F (36.7 C) 98.7 F (37.1 C) 98.6 F (37 C)  TempSrc: Oral Oral Oral Oral  SpO2:  96% 97% 97%  Weight:    221 lb 12.5 oz (100.6 kg)  Height:        Intake/Output Summary (Last 24 hours) at 08/30/2017 0853 Last data filed at 08/30/2017 0420 Gross per 24 hour  Intake 670 ml  Output 4300 ml  Net -3630 ml   Filed Weights   08/28/17 1542 08/29/17 0444 08/30/17 0419  Weight: 214 lb 11.7 oz (97.4 kg) 218 lb 11.1 oz (99.2 kg) 221 lb 12.5 oz (100.6 kg)    Telemetry    sinus - Personally Reviewed  ECG    No new EKG to review - Personally Reviewed  Physical Exam    General: Well developed, well nourished, NAD  HEENT: OP clear, mucus membranes moist  SKIN: warm, dry. No rashes. Neuro: No focal deficits  Musculoskeletal: Muscle strength 5/5 all ext  Psychiatric: Awake, oriented to person not  place Neck: No JVD, no carotid bruits, no thyromegaly, no lymphadenopathy.  Lungs:Clear bilaterally, no wheezes, rhonci, crackles Cardiovascular: Regular rate and rhythm. No murmurs, gallops or rubs. Abdomen:Soft. Bowel sounds present. Non-tender.  Extremities: No lower extremity edema. Pulses are 2 + in the bilateral DP/PT.   Labs    Chemistry Recent Labs  Lab 08/28/17 0829 08/30/17 0623  NA 137 138  K 3.8 2.6*  CL 103 104  CO2 26 28  GLUCOSE 124* 99  BUN 42* 27*  CREATININE 2.98* 2.20*  CALCIUM 8.3* 7.8*  PROT 6.6  --   ALBUMIN 2.7*  --   AST 41  --   ALT 14  --   ALKPHOS 35*  --   BILITOT 1.2  --   GFRNONAA 16* 22*  GFRAA 18* 26*  ANIONGAP 8 6     Hematology Recent Labs  Lab 08/28/17 0829 08/28/17 1639 08/29/17 0220 08/29/17 0834 08/29/17 1901 08/30/17 0623  WBC 4.1  --  4.0  --   --  4.3  RBC 3.14* 2.75* 2.83*  --   --  3.34*  HGB 7.8*  --  7.0* 7.2* 8.3* 8.4*  HCT 26.8*  --  23.5* 23.8* 26.7*  27.8*  MCV 85.4  --  83.0  --   --  83.2  MCH 24.8*  --  24.7*  --   --  25.1*  MCHC 29.1*  --  29.8*  --   --  30.2  RDW 16.6*  --  16.7*  --   --  16.5*  PLT 155  --  154  --   --  150    Cardiac Enzymes Recent Labs  Lab 08/28/17 1639 08/29/17 0037  TROPONINI 1.05* 0.44*   No results for input(s): TROPIPOC in the last 168 hours.   BNP Recent Labs  Lab 08/28/17 0829  BNP 1,330.0*     DDimer No results for input(s): DDIMER in the last 168 hours.   Radiology    Ct Head Wo Contrast  Result Date: 08/28/2017 CLINICAL DATA:  Unresponsive, fever. EXAM: CT HEAD WITHOUT CONTRAST TECHNIQUE: Contiguous axial images were obtained from the base of the skull through the vertex without intravenous contrast. COMPARISON:  06/10/2017 FINDINGS: Brain: There is atrophy and chronic small vessel disease changes. Old right basal ganglia/internal capsule lacunar infarct. Old right cerebellar infarct. No acute intracranial abnormality. Specifically, no hemorrhage,  hydrocephalus, mass lesion, acute infarction, or significant intracranial injury. Vascular: No hyperdense vessel or unexpected calcification. Skull: No acute calvarial abnormality. Sinuses/Orbits: Visualized paranasal sinuses and mastoids clear. Orbital soft tissues unremarkable. Other: None IMPRESSION: Old right basal ganglia/internal capsule lacunar infarct and right cerebellar lacunar infarct. No acute intracranial abnormality. Atrophy, chronic small vessel disease. Electronically Signed   By: Rolm Baptise M.D.   On: 08/28/2017 10:43   Ct Chest Wo Contrast  Addendum Date: 08/28/2017   ADDENDUM REPORT: 08/28/2017 14:55 ADDENDUM: Consider further evaluation with thyroid ultrasound. If patient is clinically hyperthyroid, consider nuclear medicine thyroid uptake and scan. Electronically Signed   By: San Morelle M.D.   On: 08/28/2017 14:55   Result Date: 08/28/2017 CLINICAL DATA:  Acute respiratory illness, greater than 39 years old. EXAM: CT CHEST WITHOUT CONTRAST TECHNIQUE: Multidetector CT imaging of the chest was performed following the standard protocol without IV contrast. COMPARISON:  None. FINDINGS: Cardiovascular: The heart is mildly enlarged. Diffuse coronary artery calcifications are present. A small pericardial effusion is present. Atherosclerotic calcifications are present in the aorta and branch vessels. There is mild prominence pulmonary artery is bilaterally. Mediastinum/Nodes: 10 mm right paratracheal lymph node is evident just above the carina. Other subcentimeter mediastinal lymph nodes are present. No definite hilar adenopathy is present. No significant axillary adenopathy is present. A 1.9 cm hypodense nodule is present inferiorly in the left lobe of the thyroid. Lungs/Pleura: Bilateral pleural effusions are present. Diffuse patchy ground-glass attenuation is present in the lungs bilaterally. Consolidated bilateral lower lobe airspace disease is noted bilaterally. Upper Abdomen:  The spleen is enlarged. No focal lesions are evident. Musculoskeletal: Vertebral body heights and alignment are maintained. No acute or healing fractures are present. No focal lytic or blastic lesions are present. IMPRESSION: 1. Large bilateral scratched at cardiomegaly with prominent bilateral pleural effusions and diffuse patchy ground-glass attenuation suggesting edema and congestive heart failure. 2. More prominent airspace consolidation at the bases bilaterally likely reflects atelectasis. Infection is not excluded. 3. Splenomegaly of uncertain etiology. 4.  Aortic Atherosclerosis (ICD10-I70.0). Electronically Signed: By: San Morelle M.D. On: 08/28/2017 10:48    Cardiac Studies   2D echo Study Conclusions  - Left ventricle: The cavity size was normal. Wall thickness was   increased increased in a pattern of mild to moderate LVH.  Systolic function was normal. The estimated ejection fraction was   in the range of 60% to 65%. Wall motion was normal; there were no   regional wall motion abnormalities. Features are consistent with   a pseudonormal left ventricular filling pattern, with concomitant   abnormal relaxation and increased filling pressure (grade 2   diastolic dysfunction). - Mitral valve: There was mild regurgitation. - Right atrium: Central venous pressure (est): 8 mm Hg. - Atrial septum: The septum bowed from left to right, consistent   with increased left atrial pressure. No defect or patent foramen   ovale was identified. - Tricuspid valve: There was trivial regurgitation. - Pulmonary arteries: Systolic pressure could not be accurately   estimated. - Pericardium, extracardiac: Small to moderate circumferential   pericardial effusion with evidence of organization and possible   chronicity. There was a possible small right pleural effusion.  Impressions:  - Mild to moderate LVH with LVEF 60-65% and grade 2 diastolic   dysfunction. Mild mitral regurgitation.  Trivial tricuspid   regurgitation. Bowing of the interatrial septum from left to   right without obvious ASD or PFO. Small to moderate   circumferential pericardial effusion with evidence of   organization and possible chronicity. Possible small right   pleural effusion.  Patient Profile     66 y.o. female with a hx of HFpEF, DM2, HTN, prior stroke who is transferred to [facility] from [facility], where she presented with heart failure exacerbation. Cardiology was consulted for positive troponin  Assessment & Plan    1. Acute on chronic diastolic CHF: Normal LVEF. Diuresing well with IV Lasix. Net negative 5 liters at [facility]. She is still volume overloaded. Will continue IV Lasix today. Renal function stable.   2.  Positive troponin: Felt to be due demand ischemia in setting of UTI and CHF exacerbation. Echo pending today. We will discuss possible ischemic testing as encephalopathy resolves. She is a poor candidate for cardiac cath currently. We will likely plan an inpatient nuclear stress test when her mental status has returned to baseline. She is on IV heparin currently. I think we can stop her heparin tomorrow if troponin continues to trend down as her troponin elevation is not likely due to an acute coronary syndrome.   3.  HTN: BP reasonably well controlled. Per Hospitalist service.   4.  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   5. Hypokalemia: Potassium is being replaced this am.   For questions or updates, please contact [place] Please consult [email or web address] for contact info under Cardiology/STEMI.      Signed, Lauree Chandler, MD  08/30/2017, 8:53 AM

## 2017-08-30 NOTE — Progress Notes (Signed)
PROGRESS NOTE    Madison Reed  GDJ:[id number] DOB: 08-Aug-1951 DOA: 08/28/2017 PCP: Celene Squibb, MD   Brief Narrative: Madison Reed is a 66 y.o. female with medical history significant of diastolic heart failure, diabetes, chronic respiratory failure on home oxygen. Patient presented with CHF exacerbation and possible UTI and was encephalopathy. Currently being diuresed and empiric treatment with Vancomycin and Cefepime.   Assessment & Plan:   Active Problems:   DM type 2, uncontrolled, with lower extremity ulcer ([place])   AKI (acute kidney injury) ([place])   Acute on chronic diastolic CHF (congestive heart failure) (H[facility])   Troponin level elevated   Essential hypertension   Acute cystitis without hematuria   UTI (urinary tract infection)   CKD (chronic kidney disease) stage 4, GFR 15-29 ml/min ([facility])   Acute on chronic respiratory failure with hypoxia ([facility])   Encephalopathy   Anemia   NSTEMI (non-ST elevated myocardial infarction) ([place])   Urinary tract infection Urine culture pending. Discussed with lab and initial ID is gram negative rods -Discontinue vancomycin/cefepime -Start ceftriaxone 1g daily  Encephalopathy Presumed metabolic secondary to urinary tract infection. ABG and ammonia unremarkable. Appears resolved. -continue to monitor -PT/OT eval  Acute on chronic diastolic CHF Hypervolemic on exam. Pleural effusions and interstitial edema on chest x-ray. 4.3L UOP over the last 24 hours with weight of 100.6 kg today (up from 99.2 kg yesterday). I can't imagine that weights are accurate with a 4L output of urine but patient does still have significant pitting edema. -Cardiology recommendations: continue diuresis -Foley catheter secondary to aggressive diuresis  Acute kidney injury on CKD stage IV Baseline of 1.7. Up to 2.98 on admission. Likely secondary to cardiorenal disease. Improving. -diuresis as above -repeat BMP  Acute on chronic respiratory failure Per  notes, states patient on 2 L chronically, but patient tells me 4 L chronically. Currently on 4 L and stable.  Diabetes mellitus -Continue home Lantus at 20% dose of 10 units. -SSI -Heart healthy/carb modified diet  Anemia Downtrended from admission. S/p 1 unit PRBC on 11/18 with now stable hemoglobin. No evidence of bleeding.  Elevated troponin In setting of CHF exacerbation. -Cardiology recommendations  Hypokalemia Severe secondary to significant diuresis with Lasix -potassium supplementation -repeat BMP in AM   DVT prophylaxis: Heparin Code Status: Full code Family Communication: None at bedside Disposition Plan: Pending cardiac workup   Consultants:   Cardiology  Procedures:   None  Antimicrobials:  Vancomycin (11/17>>11/18)  Cefepime (11/17>>11/18)  Levaquin (11/17)  Aztreonam (11/17)  Ceftriaxone (11/19>>   Subjective: No chest pain or dyspnea.  Objective: Vitals:   08/29/17 1327 08/29/17 1544 08/29/17 2102 08/30/17 0419  BP: (!) 153/55 (!) 157/56 (!) 166/54 (!) 141/54  Pulse:  73 71 70  Resp:  (!) 24 (!) 23 18  Temp: 98 F (36.7 C) 98.1 F (36.7 C) 98.7 F (37.1 C) 98.6 F (37 C)  TempSrc: Oral Oral Oral Oral  SpO2:  96% 97% 97%  Weight:    100.6 kg (221 lb 12.5 oz)  Height:        Intake/Output Summary (Last 24 hours) at 08/30/2017 0854 Last data filed at 08/30/2017 0420 Gross per 24 hour  Intake 670 ml  Output 4300 ml  Net -3630 ml   Filed Weights   08/28/17 1542 08/29/17 0444 08/30/17 0419  Weight: 97.4 kg (214 lb 11.7 oz) 99.2 kg (218 lb 11.1 oz) 100.6 kg (221 lb 12.5 oz)    Examination:  General exam:  Appears calm and comfortable Respiratory system: Clear to auscultation but diminished. Respiratory effort normal. Cardiovascular system: S1 & S2 heard, RRR. 1/6 systolic murmur. Gastrointestinal system: Abdomen is nondistended, soft and nontender. Normal bowel sounds heard. Central nervous system: Alert and oriented to  person, place and time except year (says it's 1980). Extremities: 2+ pitting edema. No calf tenderness Skin: No cyanosis. No rashes Psychiatry: Judgement and insight appear normal. Mood & affect depressed and flat.     Data Reviewed: I have personally reviewed following labs and imaging studies  CBC: Recent Labs  Lab 08/28/17 0829 08/29/17 0220 08/29/17 0834 08/29/17 1901 08/30/17 0623  WBC 4.1 4.0  --   --  4.3  NEUTROABS 2.9  --   --   --   --   HGB 7.8* 7.0* 7.2* 8.3* 8.4*  HCT 26.8* 23.5* 23.8* 26.7* 27.8*  MCV 85.4 83.0  --   --  83.2  PLT 155 154  --   --  235   Basic Metabolic Panel: Recent Labs  Lab 08/28/17 0829 08/30/17 0623  NA 137 138  K 3.8 2.6*  CL 103 104  CO2 26 28  GLUCOSE 124* 99  BUN 42* 27*  CREATININE 2.98* 2.20*  CALCIUM 8.3* 7.8*   GFR: Estimated Creatinine Clearance: 30.9 mL/min (A) (by C-G formula based on SCr of 2.2 mg/dL (H)). Liver Function Tests: Recent Labs  Lab 08/28/17 0829  AST 41  ALT 14  ALKPHOS 35*  BILITOT 1.2  PROT 6.6  ALBUMIN 2.7*   No results for input(s): LIPASE, AMYLASE in the last 168 hours. Recent Labs  Lab 08/28/17 1639  AMMONIA 17   Coagulation Profile: Recent Labs  Lab 08/28/17 0829  INR 1.33   Cardiac Enzymes: Recent Labs  Lab 08/28/17 1639 08/29/17 0037  TROPONINI 1.05* 0.44*   BNP (last 3 results) No results for input(s): PROBNP in the last 8760 hours. HbA1C: Recent Labs    08/29/17 0220  HGBA1C 4.9   CBG: Recent Labs  Lab 08/28/17 2244 08/29/17 0728 08/29/17 1741 08/29/17 2107 08/30/17 0831  GLUCAP 96 80 124* 140* 94   Lipid Profile: Recent Labs    08/29/17 0220  CHOL 119  HDL 11*  LDLCALC 70  TRIG 192*  CHOLHDL 10.8   Thyroid Function Tests: No results for input(s): TSH, T4TOTAL, FREET4, T3FREE, THYROIDAB in the last 72 hours. Anemia Panel: Recent Labs    08/28/17 1639  VITAMINB12 1,215*  FOLATE 20.5  FERRITIN 38  TIBC 321  IRON 20*  RETICCTPCT 2.4    Sepsis Labs: Recent Labs  Lab 08/28/17 0840 08/28/17 1137  LATICACIDVEN 0.63 0.45*    Recent Results (from the past 240 hour(s))  Blood Culture (routine x 2)     Status: None (Preliminary result)   Collection Time: 08/28/17  8:28 AM  Result Value Ref Range Status   Specimen Description BLOOD RIGHT FOREARM  Final   Special Requests   Final    BOTTLES DRAWN AEROBIC AND ANAEROBIC Blood Culture results may not be optimal due to an inadequate volume of blood received in culture bottles   Culture NO GROWTH 2 DAYS  Final   Report Status PENDING  Incomplete  Blood Culture (routine x 2)     Status: None (Preliminary result)   Collection Time: 08/28/17  8:49 AM  Result Value Ref Range Status   Specimen Description BLOOD  Final   Special Requests NONE  Final   Culture NO GROWTH 2 DAYS  Final  Report Status PENDING  Incomplete         Radiology Studies: Ct Head Wo Contrast  Result Date: 08/28/2017 CLINICAL DATA:  Unresponsive, fever. EXAM: CT HEAD WITHOUT CONTRAST TECHNIQUE: Contiguous axial images were obtained from the base of the skull through the vertex without intravenous contrast. COMPARISON:  06/10/2017 FINDINGS: Brain: There is atrophy and chronic small vessel disease changes. Old right basal ganglia/internal capsule lacunar infarct. Old right cerebellar infarct. No acute intracranial abnormality. Specifically, no hemorrhage, hydrocephalus, mass lesion, acute infarction, or significant intracranial injury. Vascular: No hyperdense vessel or unexpected calcification. Skull: No acute calvarial abnormality. Sinuses/Orbits: Visualized paranasal sinuses and mastoids clear. Orbital soft tissues unremarkable. Other: None IMPRESSION: Old right basal ganglia/internal capsule lacunar infarct and right cerebellar lacunar infarct. No acute intracranial abnormality. Atrophy, chronic small vessel disease. Electronically Signed   By: Rolm Baptise M.D.   On: 08/28/2017 10:43   Ct Chest Wo  Contrast  Addendum Date: 08/28/2017   ADDENDUM REPORT: 08/28/2017 14:55 ADDENDUM: Consider further evaluation with thyroid ultrasound. If patient is clinically hyperthyroid, consider nuclear medicine thyroid uptake and scan. Electronically Signed   By: San Morelle M.D.   On: 08/28/2017 14:55   Result Date: 08/28/2017 CLINICAL DATA:  Acute respiratory illness, greater than 26 years old. EXAM: CT CHEST WITHOUT CONTRAST TECHNIQUE: Multidetector CT imaging of the chest was performed following the standard protocol without IV contrast. COMPARISON:  None. FINDINGS: Cardiovascular: The heart is mildly enlarged. Diffuse coronary artery calcifications are present. A small pericardial effusion is present. Atherosclerotic calcifications are present in the aorta and branch vessels. There is mild prominence pulmonary artery is bilaterally. Mediastinum/Nodes: 10 mm right paratracheal lymph node is evident just above the carina. Other subcentimeter mediastinal lymph nodes are present. No definite hilar adenopathy is present. No significant axillary adenopathy is present. A 1.9 cm hypodense nodule is present inferiorly in the left lobe of the thyroid. Lungs/Pleura: Bilateral pleural effusions are present. Diffuse patchy ground-glass attenuation is present in the lungs bilaterally. Consolidated bilateral lower lobe airspace disease is noted bilaterally. Upper Abdomen: The spleen is enlarged. No focal lesions are evident. Musculoskeletal: Vertebral body heights and alignment are maintained. No acute or healing fractures are present. No focal lytic or blastic lesions are present. IMPRESSION: 1. Large bilateral scratched at cardiomegaly with prominent bilateral pleural effusions and diffuse patchy ground-glass attenuation suggesting edema and congestive heart failure. 2. More prominent airspace consolidation at the bases bilaterally likely reflects atelectasis. Infection is not excluded. 3. Splenomegaly of uncertain  etiology. 4.  Aortic Atherosclerosis (ICD10-I70.0). Electronically Signed: By: San Morelle M.D. On: 08/28/2017 10:48        Scheduled Meds: . aspirin  81 mg Oral Daily  . carvedilol  6.25 mg Oral BID WC  . furosemide  80 mg Intravenous BID  . heparin  2,000 Units Intravenous Once  . insulin aspart  0-15 Units Subcutaneous TID WC  . insulin aspart  0-5 Units Subcutaneous QHS  . insulin glargine  10 Units Subcutaneous Daily  . pantoprazole  40 mg Oral Daily  . potassium chloride  40 mEq Oral Q4H  . sodium chloride flush  3 mL Intravenous Q12H   Continuous Infusions: . sodium chloride    . sodium chloride    . cefTRIAXone (ROCEPHIN)  IV    . heparin 1,400 Units/hr (08/29/17 2215)     LOS: 2 days     Cordelia Poche, MD [facility] 08/30/2017, 8:54 AM Pager: [phone]  If 7PM-7AM, please contact night-coverage  [email or web address] Password [facility] 08/30/2017, 8:54 AM

## 2017-08-30 NOTE — Progress Notes (Signed)
ANTICOAGULATION CONSULT NOTE - Follow Up Consult  Pharmacy Consult for Heparin Indication: chest pain/ACS  Patient Measurements: Height: 5\' 6"  (167.6 cm) Weight: 221 lb 12.5 oz (100.6 kg) IBW/kg (Calculated) : 59.3 Heparin Dosing Weight: 82 kg  Vital Signs: Temp: 98.6 F (37 C) (11/19 0419) Temp Source: Oral (11/19 0419) BP: 141/54 (11/19 0419) Pulse Rate: 70 (11/19 0419)  Labs: Recent Labs    08/28/17 0829 08/28/17 1639 08/29/17 0037  08/29/17 0220 08/29/17 0834 08/29/17 1037 08/29/17 1901 08/29/17 2041 08/30/17 0623  HGB 7.8*  --   --   --  7.0* 7.2*  --  8.3*  --  8.4*  HCT 26.8*  --   --   --  23.5* 23.8*  --  26.7*  --  27.8*  PLT 155  --   --   --  154  --   --   --   --  150  LABPROT 16.4*  --   --   --   --   --   --   --   --   --   INR 1.33  --   --   --   --   --   --   --   --   --   HEPARINUNFRC  --   --   --    < > <0.10*  --  0.12*  --  <0.10* 0.17*  CREATININE 2.98*  --   --   --   --   --   --   --   --  2.20*  TROPONINI  --  1.05* 0.44*  --   --   --   --   --   --   --    < > = values in this interval not displayed.    Estimated Creatinine Clearance: 30.9 mL/min (A) (by C-G formula based on SCr of 2.2 mg/dL (H)).   Medications:  Infusions:  . sodium chloride    . sodium chloride    . ceFEPime (MAXIPIME) IV Stopped (08/29/17 2237)  . heparin 1,400 Units/hr (08/29/17 2215)    Assessment: 66yo PMHx HFpEF, DM2, HTN, and prior stroke transferred from [facility] with HF exacerbation. Pharmacy consulted to dose heparin for chest pain. -Heparin level= 0.17  Goal of Therapy:  Heparin level 0.3-0.7 units/ml Monitor platelets by anticoagulation protocol: Yes   Plan -Heparin bolus 2000 units x1 then increase infusion to 1600 units/hr -Heparin level in 8 hours and daily wth CBC daily  Hildred Laser, Pharm D 08/30/2017 8:20 AM

## 2017-08-30 NOTE — Progress Notes (Signed)
  Echocardiogram 2D Echocardiogram has been performed.  Madison Reed 08/30/2017, 4:32 PM

## 2017-08-30 NOTE — Consult Note (Signed)
[place] Nurse wound consult note Reason for Consult: foot wounds Patient is unable to give me history. She has linear area on each foot just at the bend of the dorsal surface of the foot.  I am not certain but have seen injuries like this from TED hose. She agrees that she has worn "stockings" in the past.   Wound type: ? Medical device related pressure injury; linear possibly from TED hose. Scabbed Deep tissue injury left lateral heel Pressure Injury POA: Yes Measurement: Left heel: 1cm x 1cm x 0cm; 100% dark purple, non blanchable Left dorsal foot: 0.5cm 0.5cm x 0; yellow thick scab Right great toe: 0.3cm x 0.3cm x 0cm: 100% black  Right dorsal foot: 0.5cm x 4cm x 0cm: 100% yellow thick scab Wound bed: see above Drainage (amount, consistency, odor) none from any of the wounds Periwound: intact, palpable pulses  Dressing procedure/placement/frequency: No topical care at this time due to location and history of DM Prevalon boots bilaterally to be worn while in bed to offload heel pressure injury  Discussed POC with patient and bedside nurse.  Re consult if needed, will not follow at this time. Thanks  Jeaninne Lodico [employer], RN,CWOCN, CNS, [place] [phone])

## 2017-08-30 NOTE — Progress Notes (Signed)
ANTICOAGULATION CONSULT NOTE - Follow Up Consult  Pharmacy Consult for Heparin Indication: chest pain/ACS  Patient Measurements: Height: 5\' 6"  (167.6 cm) Weight: 221 lb 12.5 oz (100.6 kg) IBW/kg (Calculated) : 59.3 Heparin Dosing Weight: 82 kg  Vital Signs: BP: 168/77 (11/19 1503) Pulse Rate: 69 (11/19 1503)  Labs: Recent Labs    08/28/17 0829 08/28/17 1639 08/29/17 0037 08/29/17 0220 08/29/17 0834  08/29/17 1901 08/29/17 2041 08/30/17 0623 08/30/17 1647  HGB 7.8*  --   --  7.0* 7.2*  --  8.3*  --  8.4*  --   HCT 26.8*  --   --  23.5* 23.8*  --  26.7*  --  27.8*  --   PLT 155  --   --  154  --   --   --   --  150  --   LABPROT 16.4*  --   --   --   --   --   --   --   --   --   INR 1.33  --   --   --   --   --   --   --   --   --   HEPARINUNFRC  --   --   --  <0.10*  --    < >  --  <0.10* 0.17* 0.32  CREATININE 2.98*  --   --   --   --   --   --   --  2.20*  --   TROPONINI  --  1.05* 0.44*  --   --   --   --   --   --   --    < > = values in this interval not displayed.    Estimated Creatinine Clearance: 30.9 mL/min (A) (by C-G formula based on SCr of 2.2 mg/dL (H)).   Medications:  Infusions:  . sodium chloride    . sodium chloride    . cefTRIAXone (ROCEPHIN)  IV    . heparin 1,600 Units/hr (08/30/17 1536)    Assessment: 66yo PMHx HFpEF, DM2, HTN, and prior stroke transferred from [facility] with HF exacerbation. Pharmacy consulted to dose heparin for chest pain. -Heparin level= 0.32  At goal on drip rate 1600 uts/hr.  Goal of Therapy:  Heparin level 0.3-0.7 units/ml Monitor platelets by anticoagulation protocol: Yes   Plan -Continue Heparin  infusion to 1600 units/hr -Heparin level  daily wth CBC daily  Bonnita Nasuti Pharm.D. CPP, BCPS Clinical Pharmacist [phone] 08/30/2017 7:07 PM

## 2017-08-31 LAB — HEPARIN LEVEL (UNFRACTIONATED)
HEPARIN UNFRACTIONATED: 0.22 [IU]/mL — AB (ref 0.30–0.70)
Heparin Unfractionated: <0.1 IU/mL — ABNORMAL LOW (ref 0.30–0.70)

## 2017-08-31 LAB — BASIC METABOLIC PANEL
ANION GAP: 9 (ref 5–15)
BUN: 23 mg/dL — AB (ref 6–20)
CHLORIDE: 102 mmol/L (ref 101–111)
CO2: 28 mmol/L (ref 22–32)
Calcium: 8 mg/dL — ABNORMAL LOW (ref 8.9–10.3)
Creatinine, Ser: 1.85 mg/dL — ABNORMAL HIGH (ref 0.44–1.00)
GFR calc Af Amer: 32 mL/min — ABNORMAL LOW (ref >60)
GFR calc non Af Amer: 28 mL/min — ABNORMAL LOW (ref >60)
GLUCOSE: 90 mg/dL (ref 65–99)
POTASSIUM: 3.2 mmol/L — AB (ref 3.5–5.1)
Sodium: 139 mmol/L (ref 135–145)

## 2017-08-31 LAB — CBC
HEMATOCRIT: 26.6 % — AB (ref 36.0–46.0)
HEMOGLOBIN: 8.2 g/dL — AB (ref 12.0–15.0)
MCH: 26.1 pg (ref 26.0–34.0)
MCHC: 30.8 g/dL (ref 30.0–36.0)
MCV: 84.7 fL (ref 78.0–100.0)
Platelets: 159 10*3/uL (ref 150–400)
RBC: 3.14 MIL/uL — ABNORMAL LOW (ref 3.87–5.11)
RDW: 17.3 % — AB (ref 11.5–15.5)
WBC: 5.4 10*3/uL (ref 4.0–10.5)

## 2017-08-31 LAB — GLUCOSE, CAPILLARY
GLUCOSE-CAPILLARY: 168 mg/dL — AB (ref 65–99)
GLUCOSE-CAPILLARY: 102 mg/dL — AB (ref 65–99)
GLUCOSE-CAPILLARY: 106 mg/dL — AB (ref 65–99)
Glucose-Capillary: 98 mg/dL (ref 65–99)

## 2017-08-31 LAB — TROPONIN I: Troponin I: 0.21 ng/mL (ref <0.03)

## 2017-08-31 MED ORDER — HYDRALAZINE HCL 50 MG PO TABS
100.0000 mg | ORAL_TABLET | Freq: Three times a day (TID) | ORAL | Status: DC
  Administered 2017-08-31 – 2017-09-04 (×12): 100 mg via ORAL
  Filled 2017-08-31 (×13): qty 2

## 2017-08-31 MED ORDER — POTASSIUM CHLORIDE CRYS ER 20 MEQ PO TBCR
20.0000 meq | EXTENDED_RELEASE_TABLET | Freq: Two times a day (BID) | ORAL | Status: DC
  Administered 2017-08-31 – 2017-09-04 (×8): 20 meq via ORAL
  Filled 2017-08-31 (×8): qty 1

## 2017-08-31 MED ORDER — POTASSIUM CHLORIDE CRYS ER 20 MEQ PO TBCR: 20.0000 meq | EXTENDED_RELEASE_TABLET | Freq: Two times a day (BID) | ORAL | Status: DC

## 2017-08-31 MED ORDER — HEPARIN SODIUM (PORCINE) 5000 UNIT/ML IJ SOLN
5000.0000 [IU] | Freq: Three times a day (TID) | INTRAMUSCULAR | Status: DC
  Administered 2017-08-31 – 2017-09-04 (×12): 5000 [IU] via SUBCUTANEOUS
  Filled 2017-08-31 (×13): qty 1

## 2017-08-31 MED ORDER — POTASSIUM CHLORIDE CRYS ER 20 MEQ PO TBCR
40.0000 meq | EXTENDED_RELEASE_TABLET | Freq: Once | ORAL | Status: AC
  Administered 2017-08-31: 40 meq via ORAL
  Filled 2017-08-31: qty 2

## 2017-08-31 MED ORDER — AMLODIPINE BESYLATE 10 MG PO TABS
10.0000 mg | ORAL_TABLET | Freq: Every day | ORAL | Status: DC
  Administered 2017-08-31 – 2017-09-04 (×5): 10 mg via ORAL
  Filled 2017-08-31 (×5): qty 1

## 2017-08-31 MED ORDER — HYDRALAZINE HCL 50 MG PO TABS: 100.0000 mg | ORAL_TABLET | Freq: Three times a day (TID) | ORAL | Status: DC

## 2017-08-31 MED ORDER — DOXAZOSIN MESYLATE 4 MG PO TABS
4.0000 mg | ORAL_TABLET | Freq: Every day | ORAL | Status: DC
  Filled 2017-08-31: qty 1

## 2017-08-31 NOTE — Evaluation (Signed)
Physical Therapy Evaluation Patient Details Name: Madison Reed MRN: [id number] DOB: [birth year] Today's Date: 08/31/2017   History of Present Illness   Madison Reed is a 66 y.o. female with medical history significant of diastolic heart failure, diabetes, chronic respiratory failure on home oxygen, who was brought to the emergency room today by her husband when she was found unresponsive.  Work up includes CHF/UTI/?NSTEMI    Clinical Impression  Pt admitted with/for complications above.  Pt reports being essentially bed bound for months and does require max assist for basic mobility/transfers.  Pt could benefit from some intensity in therapy before going back home..  Pt currently limited functionally due to the problems listed. ( See problems list.)   Pt will benefit from PT to maximize function and safety in order to get ready for next venue listed below.     Follow Up Recommendations SNF;Supervision/Assistance - 24 hour    Equipment Recommendations  Other (comment)(TBA)    Recommendations for Other Services       Precautions / Restrictions Precautions Precautions: Fall Restrictions Weight Bearing Restrictions: No      Mobility  Bed Mobility Overal bed mobility: Needs Assistance Bed Mobility: Sidelying to Sit;Sit to Sidelying;Rolling Rolling: Max assist Sidelying to sit: Max assist;HOB elevated     Sit to sidelying: Max assist;HOB elevated General bed mobility comments: Max A to elevate turnk and bring BLEs to EOB.  Pt dmeosntrating more engagment with sit>supine but contineus to reuire Max A   Transfers Overall transfer level: Needs assistance   Transfers: Squat Pivot Transfers     Squat pivot transfers: Max assist(to and from the arm chair)     General transfer comment: Focused on bedlevel due to fatigue  Ambulation/Gait             General Gait Details: NT  Stairs            Wheelchair Mobility    Modified Rankin (Stroke Patients Only)        Balance Overall balance assessment: Needs assistance Sitting-balance support: Single extremity supported;Feet supported;No upper extremity supported;Bilateral upper extremity supported Sitting balance-Leahy Scale: Poor Sitting balance - Comments: Pt using BUE support to maintain sitting balance. Pt presenting with posterior lean and required Min-Mod A to maintain sitting balance. Postural control: Posterior lean   Standing balance-Leahy Scale: Zero                               Pertinent Vitals/Pain Pain Assessment: No/denies pain    Home Living Family/patient expects to be discharged to:: Private residence Living Arrangements: Spouse/significant other Available Help at Discharge: Family;Available PRN/intermittently(husband works, dtr works until 1 then comes to help) Type of Home: [facility] Access: Stairs to enter;Ramped entrance   Technical brewer of Steps: 2 [place]: One level Home Equipment: Bedside commode;Shower seat;Walker - 2 wheels;Wheelchair - manual(hospital bed)      Prior Function Level of Independence: Needs assistance   Gait / Transfers Assistance Needed: For 3 months has be bed bound, but any transfers need heavy assist  ADL's / Homemaking Assistance Needed: ADL's completed at bed level by dtr/husband; able to perform self feeding and grooming at bed level        Hand Dominance   Dominant Hand: Right    Extremity/Trunk Assessment   Upper Extremity Assessment Upper Extremity Assessment: Generalized weakness    Lower Extremity Assessment Lower Extremity Assessment: Defer to PT evaluation  Cervical / Trunk Assessment Cervical / Trunk Assessment: Kyphotic;Other exceptions Cervical / Trunk Exceptions: Increased body habitus  Communication   Communication: No difficulties  Cognition Arousal/Alertness: Awake/alert Behavior During Therapy: Flat affect;WFL for tasks assessed/performed Overall Cognitive Status:  Impaired/Different from baseline Area of Impairment: Attention;Following commands;Safety/judgement;Awareness;Problem solving                   Current Attention Level: Sustained   Following Commands: Follows one step commands with increased time   Awareness: Intellectual Problem Solving: Slow processing;Decreased initiation;Requires tactile cues        General Comments      Exercises     Assessment/Plan    PT Assessment Patient needs continued PT services  PT Problem List Decreased strength;Decreased activity tolerance;Decreased balance;Decreased mobility;Decreased knowledge of use of DME       PT Treatment Interventions DME instruction;Gait training;Functional mobility training;Therapeutic activities;Therapeutic exercise;Balance training;Patient/family education    PT Goals (Current goals can be found in the Care Plan section)  Acute Rehab PT Goals Patient Stated Goal: Go home PT Goal Formulation: With patient Time For Goal Achievement: 09/14/17 Potential to Achieve Goals: Fair    Frequency Min 3X/week   Barriers to discharge Decreased caregiver support husband has to work and Presenter, broadcasting also to [facility] "6 Clicks" Daily Activity  Outcome Measure Difficulty turning over in bed (including adjusting bedclothes, sheets and blankets)?: Unable Difficulty moving from lying on back to sitting on the side of the bed? : Unable Difficulty sitting down on and standing up from a chair with arms (e.g., wheelchair, bedside commode, etc,.)?: Unable Help needed moving to and from a bed to chair (including a wheelchair)?: A Lot Help needed walking in hospital room?: Total Help needed climbing 3-5 steps with a railing? : Total 6 Click Score: 7    End of Session   Activity Tolerance: Patient tolerated treatment well;Patient limited by fatigue Patient left: in bed;with call bell/phone within reach Nurse Communication: Mobility  status PT Visit Diagnosis: Unsteadiness on feet (R26.81);Other abnormalities of gait and mobility (R26.89);Muscle weakness (generalized) (M62.81)    Time: 1443-1540 PT Time Calculation (min) (ACUTE ONLY): 32 min   Charges:   PT Evaluation $PT Eval Moderate Complexity: 1 Mod PT Treatments $Therapeutic Activity: 8-22 mins   PT G Codes:        09/12/17  Donnella Sham, PT [phone]  (pager)  Tessie Fass Raelyn Racette 09/12/2017, 6:02 PM

## 2017-08-31 NOTE — Progress Notes (Addendum)
PROGRESS NOTE    Madison Reed  ZOX:[id number] DOB: 28-Sep-1951 DOA: 08/28/2017 PCP: Celene Squibb, MD   Brief Narrative: Madison Reed is a 66 y.o. female with medical history significant of diastolic heart failure, diabetes, chronic respiratory failure on home oxygen. Patient presented with CHF exacerbation and possible UTI and was encephalopathy. Currently being diuresed and empiric treatment with Vancomycin and Cefepime.   Assessment & Plan:   Active Problems:   DM type 2, uncontrolled, with lower extremity ulcer ([place])   AKI (acute kidney injury) ([place])   Acute on chronic diastolic CHF (congestive heart failure) (HCC)   Troponin level elevated   Essential hypertension   Acute cystitis without hematuria   UTI (urinary tract infection)   CKD (chronic kidney disease) stage 4, GFR 15-29 ml/min (H[facility])   Acute on chronic respiratory failure with hypoxia (HCC)   Encephalopathy   Anemia   NSTEMI (non-ST elevated myocardial infarction) (HCC)   Elevated troponin   Urinary tract infection Urine culture pending. E. Coli. -Continue ceftriaxone 1g daily  Encephalopathy Presumed metabolic secondary to urinary tract infection. ABG and ammonia unremarkable. Appears resolved. -continue to monitor -PT/OT eval  Acute on chronic diastolic CHF Hypervolemic on exam. Pleural effusions and interstitial edema on chest x-ray. 4.8L UOP over the last 24 hours with weight now down 8.2 kg from yesterday (4.8 kg from two days ago). She is negative 7.3L since admission. -Cardiology recommendations: continue diuresis -Foley catheter secondary to aggressive diuresis, remove once IV diuresis discontinued  Acute kidney injury on CKD stage IV Baseline of 1.7. Up to 2.98 on admission. Likely secondary to cardiorenal disease. Improving. -diuresis as above -repeat BMP  Acute on chronic respiratory failure Per notes, states patient on 2 L chronically, but patient tells me 4 L chronically. Currently on 4 L  and stable.  Diabetes mellitus -Continue home Lantus at 20% decreased dose of 10 units. -SSI -Heart healthy/carb modified diet  Anemia Chronic with acute downtrended from admission. Appears to possibly be mixed anemia of chronic disease and iron deficiency. S/p 1 unit PRBC on 11/18 with now stable hemoglobin. No evidence of bleeding.  Elevated troponin In setting of CHF exacerbation. -Cardiology recommendations: nuclear stress test, heparin drip  Hypokalemia Severe secondary to significant diuresis with Lasix -potassium supplementation -repeat BMP in AM   DVT prophylaxis: Heparin Code Status: Full code Family Communication: None at bedside Disposition Plan: Pending cardiac workup   Consultants:   Cardiology  Procedures:   None  Antimicrobials:  Vancomycin (11/17>>11/18)  Cefepime (11/17>>11/18)  Levaquin (11/17)  Aztreonam (11/17)  Ceftriaxone (11/19>>   Subjective: No chest pain or dyspnea.  Objective: Vitals:   08/30/17 1503 08/30/17 2107 08/31/17 0515 08/31/17 0843  BP: (!) 168/77 (!) 186/63 (!) 184/58 (!) 167/48  Pulse: 69 74 80   Resp: 19 (!) 21 (!) 22   Temp:  99.6 F (37.6 C) 100.2 F (37.9 C)   TempSrc:  Oral Oral   SpO2: 96% 98% 98%   Weight:   92.4 kg (203 lb 12.8 oz)   Height:        Intake/Output Summary (Last 24 hours) at 08/31/2017 1003 Last data filed at 08/31/2017 0946 Gross per 24 hour  Intake 2706.67 ml  Output 5300 ml  Net -2593.33 ml   Filed Weights   08/29/17 0444 08/30/17 0419 08/31/17 0515  Weight: 99.2 kg (218 lb 11.1 oz) 100.6 kg (221 lb 12.5 oz) 92.4 kg (203 lb 12.8 oz)    Examination:  General exam: Appears calm and comfortable Respiratory system: Clear to auscultation but diminished. Respiratory effort normal. Cardiovascular system: S1 & S2 heard, RRR. 1/6 systolic murmur. Gastrointestinal system: Abdomen is nondistended, soft and nontender. Normal bowel sounds heard. Central nervous system: Alert and  oriented to person, place and time. Extremities: 2+ pitting edema mainly in hip/thighs. No calf tenderness Skin: No cyanosis. No rashes Psychiatry: Judgement and insight appear normal. Mood & affect depressed and flat.     Data Reviewed: I have personally reviewed following labs and imaging studies  CBC: Recent Labs  Lab 08/28/17 0829 08/29/17 0220 08/29/17 0834 08/29/17 1901 08/30/17 0623 08/31/17 0400  WBC 4.1 4.0  --   --  4.3 5.4  NEUTROABS 2.9  --   --   --   --   --   HGB 7.8* 7.0* 7.2* 8.3* 8.4* 8.2*  HCT 26.8* 23.5* 23.8* 26.7* 27.8* 26.6*  MCV 85.4 83.0  --   --  83.2 84.7  PLT 155 154  --   --  150 174   Basic Metabolic Panel: Recent Labs  Lab 08/28/17 0829 08/30/17 0623 08/31/17 0400  NA 137 138 139  K 3.8 2.6* 3.2*  CL 103 104 102  CO2 26 28 28   GLUCOSE 124* 99 90  BUN 42* 27* 23*  CREATININE 2.98* 2.20* 1.85*  CALCIUM 8.3* 7.8* 8.0*   GFR: Estimated Creatinine Clearance: 35.2 mL/min (A) (by C-G formula based on SCr of 1.85 mg/dL (H)). Liver Function Tests: Recent Labs  Lab 08/28/17 0829  AST 41  ALT 14  ALKPHOS 35*  BILITOT 1.2  PROT 6.6  ALBUMIN 2.7*   No results for input(s): LIPASE, AMYLASE in the last 168 hours. Recent Labs  Lab 08/28/17 1639  AMMONIA 17   Coagulation Profile: Recent Labs  Lab 08/28/17 0829  INR 1.33   Cardiac Enzymes: Recent Labs  Lab 08/28/17 1639 08/29/17 0037  TROPONINI 1.05* 0.44*   BNP (last 3 results) No results for input(s): PROBNP in the last 8760 hours. HbA1C: Recent Labs    08/29/17 0220  HGBA1C 4.9   CBG: Recent Labs  Lab 08/30/17 0831 08/30/17 1238 08/30/17 1629 08/30/17 2106 08/31/17 0751  GLUCAP 94 107* 110* 109* 98   Lipid Profile: Recent Labs    08/29/17 0220  CHOL 119  HDL 11*  LDLCALC 70  TRIG 192*  CHOLHDL 10.8   Thyroid Function Tests: No results for input(s): TSH, T4TOTAL, FREET4, T3FREE, THYROIDAB in the last 72 hours. Anemia Panel: Recent Labs     08/28/17 1639  VITAMINB12 1,215*  FOLATE 20.5  FERRITIN 38  TIBC 321  IRON 20*  RETICCTPCT 2.4   Sepsis Labs: Recent Labs  Lab 08/28/17 0840 08/28/17 1137  LATICACIDVEN 0.63 0.45*    Recent Results (from the past 240 hour(s))  Urine culture     Status: Abnormal (Preliminary result)   Collection Time: 08/28/17  8:23 AM  Result Value Ref Range Status   Specimen Description URINE, CLEAN CATCH  Final   Special Requests NONE  Final   Culture (A)  Final    >=100,000 COLONIES/mL ESCHERICHIA COLI REPEATING SENSITIVITIES Performed at [facility], [place] [address] [place], [place] [address]    Report Status PENDING  Incomplete  Blood Culture (routine x 2)     Status: None (Preliminary result)   Collection Time: 08/28/17  8:28 AM  Result Value Ref Range Status   Specimen Description BLOOD RIGHT FOREARM  Final   Special Requests  Final    BOTTLES DRAWN AEROBIC AND ANAEROBIC Blood Culture results may not be optimal due to an inadequate volume of blood received in culture bottles   Culture NO GROWTH 3 DAYS  Final   Report Status PENDING  Incomplete  Blood Culture (routine x 2)     Status: None (Preliminary result)   Collection Time: 08/28/17  8:49 AM  Result Value Ref Range Status   Specimen Description BLOOD RIGHT HAND  Final   Special Requests   Final    BOTTLES DRAWN AEROBIC AND ANAEROBIC Blood Culture results may not be optimal due to an inadequate volume of blood received in culture bottles   Culture NO GROWTH 3 DAYS  Final   Report Status PENDING  Incomplete         Radiology Studies: Dg Chest Port 1 View  Result Date: 08/30/2017 CLINICAL DATA:  CHF exacerbation.  Shortness of breath. EXAM: PORTABLE CHEST 1 VIEW COMPARISON:  CT chest and single view of the chest 08/28/2017. FINDINGS: Moderate to moderately large bilateral pleural effusions and associated airspace disease persist without change. There is cardiomegaly. Pulmonary edema seen on the prior plain film  appears improved. No pneumothorax. Atherosclerosis is noted. IMPRESSION: Improved pulmonary edema. No change in bilateral pleural effusions and basilar airspace disease, likely atelectasis. Cardiomegaly. Atherosclerosis. Electronically Signed   By: Inge Rise M.D.   On: 08/30/2017 09:26        Scheduled Meds: . amLODipine  10 mg Oral Daily  . aspirin  81 mg Oral Daily  . carvedilol  6.25 mg Oral BID WC  . furosemide  80 mg Intravenous BID  . hydrALAZINE  100 mg Oral Q8H  . insulin aspart  0-15 Units Subcutaneous TID WC  . insulin aspart  0-5 Units Subcutaneous QHS  . insulin glargine  10 Units Subcutaneous Daily  . pantoprazole  40 mg Oral Daily  . potassium chloride  40 mEq Oral Once  . sodium chloride flush  3 mL Intravenous Q12H   Continuous Infusions: . sodium chloride    . sodium chloride    . cefTRIAXone (ROCEPHIN)  IV Stopped (08/30/17 2312)  . heparin 1,600 Units/hr (08/31/17 0345)     LOS: 3 days     Cordelia Poche, MD [facility] 08/31/2017, 10:03 AM Pager: [phone]  If 7PM-7AM, please contact night-coverage [email or web address] Password [email or web address] 08/31/2017, 10:03 AM

## 2017-08-31 NOTE — Evaluation (Signed)
Occupational Therapy Evaluation Patient Details Name: Madison Reed MRN: [id number] DOB: Apr 09, 1951 Today's Date: 08/31/2017    History of Present Illness  Madison Reed is a 66 y.o. female with medical history significant of diastolic heart failure, diabetes, chronic respiratory failure on home oxygen, who was brought to the emergency room today by her husband when she was found unresponsive.  Work up includes CHF/UTI/?NSTEMI     Clinical Impression   PTA, pt was living with her husband and required assistance for ADLs and functional transfers from husband and daughter; pt with recent functional decline and performing ADLs at bed level. Pt currently requiring Mod A for UB ADLs and Max A for LB ADLs at bed level. Pt demonstrating decreased cognition and required increased time and VCs throughout session. Pt would benefit form further acute OT to facilitate safe dc. Recommend dc to SNF for further acute OT to increase safety and independence with ADLs and functional mobility as well as decreased caregiver burden.     Follow Up Recommendations  SNF;Supervision/Assistance - 24 hour    Equipment Recommendations  Other (comment)(Defer ot next venue)    Recommendations for Other Services PT consult     Precautions / Restrictions Precautions Precautions: Fall Restrictions Weight Bearing Restrictions: No      Mobility Bed Mobility Overal bed mobility: Needs Assistance Bed Mobility: Sidelying to Sit;Sit to Sidelying;Rolling Rolling: Max assist Sidelying to sit: Max assist;HOB elevated     Sit to sidelying: Max assist;HOB elevated General bed mobility comments: Max A to elevate turnk and bring BLEs to EOB.  Pt dmeosntrating more engagment with sit>supine but contineus to reuire Max A   Transfers                 General transfer comment: Focused on bedlevel due to fatigue    Balance Overall balance assessment: Needs assistance Sitting-balance support: Single extremity  supported;Feet supported;No upper extremity supported;Bilateral upper extremity supported Sitting balance-Leahy Scale: Poor Sitting balance - Comments: Pt using BUE support to maintain sitting balance. Pt presenting with posterior lean and required Min-Mod A to maintain sitting balance. Postural control: Posterior lean                                 ADL either performed or assessed with clinical judgement   ADL Overall ADL's : Needs assistance/impaired Eating/Feeding: Set up;Sitting;Bed level(supported sitting)   Grooming: Set up;Supervision/safety;Bed level Grooming Details (indicate cue type and reason): requires supportive sitting. ABle to bring BUEs to mouth and top of head Upper Body Bathing: Moderate assistance;Bed level;Sitting   Lower Body Bathing: Maximal assistance;Bed level   Upper Body Dressing : Moderate assistance;Sitting Upper Body Dressing Details (indicate cue type and reason): sitting at EOB, pt donned new gown with Mod A. Pt also requiring occasional Mod A for sitting balance at EOB; tendency for posterior lean Lower Body Dressing: Maximal assistance;Bed level                 General ADL Comments: Pt demonstrating decreased functional performance and requires Mod-Max A for ADLs at bed level. Pt with decreased cognition and requires increased VCs for maintaining attention. During disucssion of rehab at dc, pt would close her eyes and require VCs to engage with OT. Pt more alert with sitting at EOB     Vision Baseline Vision/History: Wears glasses;Cataracts Wears Glasses: At all times Patient Visual Report: No change from baseline;Other (comment)(Pt reporting she  can't wear glasses all the time due to cataracts)       Perception     Praxis      Pertinent Vitals/Pain Pain Assessment: No/denies pain     Hand Dominance Right   Extremity/Trunk Assessment Upper Extremity Assessment Upper Extremity Assessment: Generalized weakness   Lower  Extremity Assessment Lower Extremity Assessment: Defer to PT evaluation   Cervical / Trunk Assessment Cervical / Trunk Assessment: Kyphotic;Other exceptions Cervical / Trunk Exceptions: Increased body habitus   Communication Communication Communication: No difficulties   Cognition Arousal/Alertness: Awake/alert Behavior During Therapy: Flat affect;WFL for tasks assessed/performed Overall Cognitive Status: Impaired/Different from baseline Area of Impairment: Attention;Following commands;Safety/judgement;Awareness;Problem solving                   Current Attention Level: Sustained   Following Commands: Follows one step commands with increased time   Awareness: Intellectual Problem Solving: Slow processing;Decreased initiation;Requires tactile cues     General Comments       Exercises     Shoulder Instructions      Home Living Family/patient expects to be discharged to:: Private residence Living Arrangements: Spouse/significant other Available Help at Discharge: Family;Available PRN/intermittently(husband works, dtr works until 1 then comes to help) Type of Home: [employer] Access: Stairs to enter;Ramped entrance Technical brewer of Steps: 2   [place]: One level     Bathroom Shower/Tub: Teacher, early years/pre: Standard Bathroom Accessibility: Yes   Home Equipment: Bedside commode;Shower seat;Walker - 2 wheels;Wheelchair - manual(hospital bed)          Prior Functioning/Environment Level of Independence: Needs assistance  Gait / Transfers Assistance Needed: For 3 months has be bed bound, but any transfers need heavy assist ADL's / Homemaking Assistance Needed: ADL's completed at bed level by dtr/husband; able to perform self feeding and grooming at bed level            OT Problem List: Decreased strength;Decreased range of motion;Decreased activity tolerance;Impaired balance (sitting and/or standing);Decreased cognition;Decreased  safety awareness;Decreased knowledge of use of DME or AE      OT Treatment/Interventions: Self-care/ADL training;Therapeutic exercise;Energy conservation;DME and/or AE instruction;Therapeutic activities;Patient/family education    OT Goals(Current goals can be found in the care plan section) Acute Rehab OT Goals Patient Stated Goal: Go home OT Goal Formulation: With patient Time For Goal Achievement: 09/14/17 Potential to Achieve Goals: Good ADL Goals Pt Will Perform Grooming: with set-up;with supervision;sitting Pt Will Perform Upper Body Dressing: with min assist;sitting Pt Will Perform Lower Body Dressing: with mod assist;sit to/from stand Pt Will Transfer to Toilet: with mod assist;with +2 assist;bedside commode;stand pivot transfer Pt Will Perform Toileting - Clothing Manipulation and hygiene: with mod assist;sit to/from stand  OT Frequency: Min 2X/week   Barriers to D/C:            Co-evaluation              AM-PAC PT "6 Clicks" Daily Activity     Outcome Measure Help from another person eating meals?: A Little Help from another person taking care of personal grooming?: A Little Help from another person toileting, which includes using toliet, bedpan, or urinal?: Total Help from another person bathing (including washing, rinsing, drying)?: Total Help from another person to put on and taking off regular upper body clothing?: A Lot Help from another person to put on and taking off regular lower body clothing?: Total 6 Click Score: 11   End of Session Equipment Utilized During Treatment: Oxygen(4L) Nurse  Communication: Mobility status  Activity Tolerance: Patient limited by fatigue Patient left: in bed;with bed alarm set;with call bell/phone within reach  OT Visit Diagnosis: Unsteadiness on feet (R26.81);Other abnormalities of gait and mobility (R26.89);Muscle weakness (generalized) (M62.81);Other symptoms and signs involving cognitive function                Time:  7425-9563 OT Time Calculation (min): 22 min Charges:  OT General Charges $OT Visit: 1 Visit OT Evaluation $OT Eval Moderate Complexity: 1 Mod G-Codes:     Pauline Pegues MSOT, OTR/L Acute Rehab Pager: [phone] Office: [place] 08/31/2017, 4:04 PM

## 2017-08-31 NOTE — Progress Notes (Signed)
Telemetry called with report of increased runs of Trigeminy in the last hour, patient has had frequent PVC's throughout the day, however in the last hour patient has had an increase in runs of trigeminy. Contacted Dr. Georgena Spurling via Amion with the report.

## 2017-08-31 NOTE — Progress Notes (Signed)
Progress Note  Patient Name: MAVIS GRAVELLE Date of Encounter: 08/31/2017  Primary Cardiologist: Radford Pax  Subjective   No chest pain or dyspnea.   Inpatient Medications    Scheduled Meds: . amLODipine  10 mg Oral Daily  . aspirin  81 mg Oral Daily  . carvedilol  6.25 mg Oral BID WC  . furosemide  80 mg Intravenous BID  . hydrALAZINE  100 mg Oral Q8H  . insulin aspart  0-15 Units Subcutaneous TID WC  . insulin aspart  0-5 Units Subcutaneous QHS  . insulin glargine  10 Units Subcutaneous Daily  . pantoprazole  40 mg Oral Daily  . potassium chloride  40 mEq Oral Once  . sodium chloride flush  3 mL Intravenous Q12H   Continuous Infusions: . sodium chloride    . sodium chloride    . cefTRIAXone (ROCEPHIN)  IV Stopped (08/30/17 2312)  . heparin 1,600 Units/hr (08/31/17 0345)   PRN Meds: sodium chloride, acetaminophen, ondansetron (ZOFRAN) IV, sodium chloride flush   Vital Signs    Vitals:   08/30/17 2107 08/31/17 0515 08/31/17 0843 08/31/17 1149  BP: (!) 186/63 (!) 184/58 (!) 167/48 (!) 129/50  Pulse: 74 80    Resp: (!) 21 (!) 22    Temp: 99.6 F (37.6 C) 100.2 F (37.9 C)    TempSrc: Oral Oral    SpO2: 98% 98%    Weight:  203 lb 12.8 oz (92.4 kg)    Height:        Intake/Output Summary (Last 24 hours) at 08/31/2017 1152 Last data filed at 08/31/2017 0946 Gross per 24 hour  Intake 2706.67 ml  Output 5300 ml  Net -2593.33 ml   Filed Weights   08/29/17 0444 08/30/17 0419 08/31/17 0515  Weight: 218 lb 11.1 oz (99.2 kg) 221 lb 12.5 oz (100.6 kg) 203 lb 12.8 oz (92.4 kg)    Telemetry    sinus- Personally Reviewed  ECG    No new EKG to review - Personally Reviewed  Physical Exam    General: Well developed, well nourished, NAD. She alert and oriented x 3 with slow speech pattern HEENT: OP clear, mucus membranes moist  SKIN: warm, dry. No rashes. Neuro: No focal deficits  Musculoskeletal: Muscle strength 5/5 all ext  Psychiatric: Mood and affect  normal  Neck: No JVD, no carotid bruits, no thyromegaly, no lymphadenopathy.  Lungs:Clear bilaterally, no wheezes, rhonci, crackles Cardiovascular: Regular rate and rhythm. No murmurs, gallops or rubs. Abdomen:Soft. Bowel sounds present. Non-tender.  Extremities: 1+ bilateral lower extremity edema. Pulses are 2 + in the bilateral DP/PT.   Labs    Chemistry Recent Labs  Lab 08/28/17 0829 08/30/17 0623 08/31/17 0400  NA 137 138 139  K 3.8 2.6* 3.2*  CL 103 104 102  CO2 26 28 28   GLUCOSE 124* 99 90  BUN 42* 27* 23*  CREATININE 2.98* 2.20* 1.85*  CALCIUM 8.3* 7.8* 8.0*  PROT 6.6  --   --   ALBUMIN 2.7*  --   --   AST 41  --   --   ALT 14  --   --   ALKPHOS 35*  --   --   BILITOT 1.2  --   --   GFRNONAA 16* 22* 28*  GFRAA 18* 26* 32*  ANIONGAP 8 6 9      Hematology Recent Labs  Lab 08/29/17 0220  08/29/17 1901 08/30/17 0623 08/31/17 0400  WBC 4.0  --   --  4.3 5.4  RBC 2.83*  --   --  3.34* 3.14*  HGB 7.0*   < > 8.3* 8.4* 8.2*  HCT 23.5*   < > 26.7* 27.8* 26.6*  MCV 83.0  --   --  83.2 84.7  MCH 24.7*  --   --  25.1* 26.1  MCHC 29.8*  --   --  30.2 30.8  RDW 16.7*  --   --  16.5* 17.3*  PLT 154  --   --  150 159   < > = values in this interval not displayed.    Cardiac Enzymes Recent Labs  Lab 08/28/17 1639 08/29/17 0037  TROPONINI 1.05* 0.44*   No results for input(s): TROPIPOC in the last 168 hours.   BNP Recent Labs  Lab 08/28/17 0829  BNP 1,330.0*     DDimer No results for input(s): DDIMER in the last 168 hours.   Radiology    Dg Chest Port 1 View  Result Date: 08/30/2017 CLINICAL DATA:  CHF exacerbation.  Shortness of breath. EXAM: PORTABLE CHEST 1 VIEW COMPARISON:  CT chest and single view of the chest 08/28/2017. FINDINGS: Moderate to moderately large bilateral pleural effusions and associated airspace disease persist without change. There is cardiomegaly. Pulmonary edema seen on the prior plain film appears improved. No pneumothorax.  Atherosclerosis is noted. IMPRESSION: Improved pulmonary edema. No change in bilateral pleural effusions and basilar airspace disease, likely atelectasis. Cardiomegaly. Atherosclerosis. Electronically Signed   By: Inge Rise M.D.   On: 08/30/2017 09:26    Cardiac Studies   Echo 08/30/17:  - Left ventricle: The cavity size was normal. There was mild   concentric hypertrophy. Systolic function was normal. The   estimated ejection fraction was in the range of 55% to 60%. Wall   motion was normal; there were no regional wall motion   abnormalities. Features are consistent with a pseudonormal left   ventricular filling pattern, with concomitant abnormal relaxation   and increased filling pressure (grade 2 diastolic dysfunction). - Mitral valve: There was moderate regurgitation directed   eccentrically and posteriorly. - Left atrium: The atrium was mildly dilated. - Right ventricle: The cavity size was moderately dilated. - Pericardium, extracardiac: A small pericardial effusion was   identified circumferential, but mostly posterior to the heart.   There was no evidence of hemodynamic compromise.  Impressions:  - No significant change from July 2018 study.  Patient Profile     66 y.o. female with a hx of HFpEF, DM2, HTN, prior stroke who is transferred to [facility] from [facility], where she presented with heart failure exacerbation. Cardiology was consulted for positive troponin  Assessment & Plan    1. Acute on chronic diastolic CHF: She has normal LV systolic function by echo 08/30/17. She has diuresed well with 7.4 liters net negative since admission. She is still volume overloaded.  -Will continue IV Lasix today.    2.  Elevated troponin: This is felt to be due to demand ischemia in the setting of UTI. I have told her today that I think we can plan a nuclear stress test prior to discharge to exclude a large area of ischemia. She has no chest pain. Can d/c heparin drip  today.     3.  HTN: BP is controlled.    4.  Acute on chronic kidney disease stage IV: Renal function is improving with diuresis.   5. Hypokalemia: Potassium is being replaced this am.   For questions or updates, please contact [place]  Please consult [email or web address] for contact info under Cardiology/STEMI.      Signed, Lauree Chandler, MD  08/31/2017, 11:52 AM

## 2017-09-01 DIAGNOSIS — A419 Sepsis, unspecified organism: Secondary | ICD-10-CM

## 2017-09-01 LAB — CBC
HEMATOCRIT: 27.5 % — AB (ref 36.0–46.0)
Hemoglobin: 8.4 g/dL — ABNORMAL LOW (ref 12.0–15.0)
MCH: 26 pg (ref 26.0–34.0)
MCHC: 30.5 g/dL (ref 30.0–36.0)
MCV: 85.1 fL (ref 78.0–100.0)
PLATELETS: 163 10*3/uL (ref 150–400)
RBC: 3.23 MIL/uL — ABNORMAL LOW (ref 3.87–5.11)
RDW: 17.3 % — AB (ref 11.5–15.5)
WBC: 5.7 10*3/uL (ref 4.0–10.5)

## 2017-09-01 LAB — GLUCOSE, CAPILLARY
GLUCOSE-CAPILLARY: 91 mg/dL (ref 65–99)
GLUCOSE-CAPILLARY: 124 mg/dL — AB (ref 65–99)
GLUCOSE-CAPILLARY: 120 mg/dL — AB (ref 65–99)
Glucose-Capillary: 115 mg/dL — ABNORMAL HIGH (ref 65–99)

## 2017-09-01 LAB — BASIC METABOLIC PANEL
Anion gap: 8 (ref 5–15)
BUN: 19 mg/dL (ref 6–20)
CHLORIDE: 99 mmol/L — AB (ref 101–111)
CO2: 32 mmol/L (ref 22–32)
CREATININE: 1.62 mg/dL — AB (ref 0.44–1.00)
Calcium: 8.2 mg/dL — ABNORMAL LOW (ref 8.9–10.3)
GFR, EST AFRICAN AMERICAN: 38 mL/min — AB (ref >60)
GFR, EST NON AFRICAN AMERICAN: 33 mL/min — AB (ref >60)
Glucose, Bld: 90 mg/dL (ref 65–99)
POTASSIUM: 3.5 mmol/L (ref 3.5–5.1)
SODIUM: 139 mmol/L (ref 135–145)

## 2017-09-01 LAB — URINE CULTURE

## 2017-09-01 LAB — HEPARIN LEVEL (UNFRACTIONATED): Heparin Unfractionated: <0.1 IU/mL — ABNORMAL LOW (ref 0.30–0.70)

## 2017-09-01 NOTE — Progress Notes (Signed)
PROGRESS NOTE    Madison Reed  WLN:[id number] DOB: 1950-12-21 DOA: 08/28/2017 PCP: Celene Squibb, MD    Brief Narrative: Madison Reed is a 66 y.o. femalewith medical history significant ofdiastolic heart failure, diabetes, chronic respiratory failure on home oxygen. Patient presented with CHF exacerbation and possible UTI and was encephalopathy. Currently being diuresed and empiric treatment with Vancomycin and Cefepime.     Assessment & Plan:   Active Problems:   DM type 2, uncontrolled, with lower extremity ulcer ([place])   AKI (acute kidney injury) ([place])   Acute on chronic diastolic CHF (congestive heart failure) (H[facility])   Troponin level elevated   Essential hypertension   Acute cystitis without hematuria   UTI (urinary tract infection)   CKD (chronic kidney disease) stage 4, GFR 15-29 ml/min (H[facility])   Acute on chronic respiratory failure with hypoxia (HCC)   Encephalopathy   Anemia   NSTEMI (non-ST elevated myocardial infarction) (HCC)   Elevated troponin   Urinary tract infection Urine culture pending. E. Coli. -Continue ceftriaxone 1g daily. Change to oral in am.   Encephalopathy Presumed metabolic secondary to urinary tract infection. ABG and ammonia unremarkable. Appears resolved. -continue to monitor -PT/OT eval  Acute on chronic diastolic CHF Hypervolemic on exam. Pleural effusions and interstitial edema on chest x-ray. 4.8L UOP over the last 24 hours with weight now down 8.2 kg from yesterday (4.8 kg from two days ago).  -She is negative 8.3L since admission. -Cardiology recommendations: continue diuresis -Foley catheter secondary to aggressive diuresis, remove once IV diuresis discontinued  Acute kidney injury on CKD stage IV Baseline of 1.7. Up to 2.98 on admission. Likely secondary to cardiorenal disease. Improving. -diuresis as above Cr trending down.   Acute on chronic respiratory failure Per notes, states patient on 2 L chronically, but patient  tells me 4 L chronically. Currently on 4 L and stable.  Diabetes mellitus -Continue home Lantus at 20% decreased dose of 10 units. -SSI -Heart healthy/carb modified diet  Anemia S/p 1 unit PRBC on 11/18 with now stable hemoglobin. No evidence of bleeding. Hb stable.   Elevated troponin In setting of CHF exacerbation. -Cardiology recommendations: nuclear stress test prior to discharge  Hypokalemia Resolved.       DVT prophylaxis: heparin  Code Status: full code.  Family Communication: none at bedside.  Disposition Plan: home vs SNF   Consultants:   Cardiology    Procedures:   none   Antimicrobials:   Ceftriaxone 11-19  Vancomycin (11/17>>11/18)  Cefepime (11/17>>11/18)  Levaquin (11/17)  Aztreonam (11/17)  Ceftriaxone (11/19>>     Subjective: Sleepy, relates she was not able to sleep overnight.   Objective: Vitals:   08/31/17 2125 08/31/17 2359 09/01/17 0448 09/01/17 0849  BP: (!) 157/62 (!) 155/59 (!) 148/59 (!) 170/64  Pulse: 74  80   Resp: (!) 22 20 15    Temp: 98.9 F (37.2 C) 98.6 F (37 C) 98.6 F (37 C)   TempSrc: Oral Oral Oral   SpO2: 97% 95% 100%   Weight:   91.7 kg (202 lb 3.2 oz)   Height:        Intake/Output Summary (Last 24 hours) at 09/01/2017 1058 Last data filed at 09/01/2017 0452 Gross per 24 hour  Intake 530 ml  Output 2525 ml  Net -1995 ml   Filed Weights   08/30/17 0419 08/31/17 0515 09/01/17 0448  Weight: 100.6 kg (221 lb 12.5 oz) 92.4 kg (203 lb 12.8 oz) 91.7 kg (202 lb 3.2  oz)    Examination:  General exam: NAD Respiratory system: Bilateral crackles.  Cardiovascular system: S 1, S 2 RRR Gastrointestinal system: BS present, soft, nt Central nervous system: non focal.  Extremities: Symmetric 5 x 5 power. Skin: No rashes, lesions or ulcers    Data Reviewed: I have personally reviewed following labs and imaging studies  CBC: Recent Labs  Lab 08/28/17 0829 08/29/17 0220 08/29/17 0834  08/29/17 1901 08/30/17 0623 08/31/17 0400 09/01/17 0346  WBC 4.1 4.0  --   --  4.3 5.4 5.7  NEUTROABS 2.9  --   --   --   --   --   --   HGB 7.8* 7.0* 7.2* 8.3* 8.4* 8.2* 8.4*  HCT 26.8* 23.5* 23.8* 26.7* 27.8* 26.6* 27.5*  MCV 85.4 83.0  --   --  83.2 84.7 85.1  PLT 155 154  --   --  150 159 354   Basic Metabolic Panel: Recent Labs  Lab 08/28/17 0829 08/30/17 0623 08/31/17 0400 09/01/17 0346  NA 137 138 139 139  K 3.8 2.6* 3.2* 3.5  CL 103 104 102 99*  CO2 26 28 28  32  GLUCOSE 124* 99 90 90  BUN 42* 27* 23* 19  CREATININE 2.98* 2.20* 1.85* 1.62*  CALCIUM 8.3* 7.8* 8.0* 8.2*   GFR: Estimated Creatinine Clearance: 40 mL/min (A) (by C-G formula based on SCr of 1.62 mg/dL (H)). Liver Function Tests: Recent Labs  Lab 08/28/17 0829  AST 41  ALT 14  ALKPHOS 35*  BILITOT 1.2  PROT 6.6  ALBUMIN 2.7*   No results for input(s): LIPASE, AMYLASE in the last 168 hours. Recent Labs  Lab 08/28/17 1639  AMMONIA 17   Coagulation Profile: Recent Labs  Lab 08/28/17 0829  INR 1.33   Cardiac Enzymes: Recent Labs  Lab 08/28/17 1639 08/29/17 0037 08/31/17 1112  TROPONINI 1.05* 0.44* 0.21*   BNP (last 3 results) No results for input(s): PROBNP in the last 8760 hours. HbA1C: No results for input(s): HGBA1C in the last 72 hours. CBG: Recent Labs  Lab 08/31/17 0751 08/31/17 1113 08/31/17 1553 08/31/17 2126 09/01/17 0741  GLUCAP 98 168* 102* 106* 91   Lipid Profile: No results for input(s): CHOL, HDL, LDLCALC, TRIG, CHOLHDL, LDLDIRECT in the last 72 hours. Thyroid Function Tests: No results for input(s): TSH, T4TOTAL, FREET4, T3FREE, THYROIDAB in the last 72 hours. Anemia Panel: No results for input(s): VITAMINB12, FOLATE, FERRITIN, TIBC, IRON, RETICCTPCT in the last 72 hours. Sepsis Labs: Recent Labs  Lab 08/28/17 0840 08/28/17 1137  LATICACIDVEN 0.63 0.45*    Recent Results (from the past 240 hour(s))  Urine culture     Status: Abnormal   Collection  Time: 08/28/17  8:23 AM  Result Value Ref Range Status   Specimen Description URINE, CLEAN CATCH  Final   Special Requests NONE  Final   Culture >=100,000 COLONIES/mL ESCHERICHIA COLI (A)  Final   Report Status 09/01/2017 FINAL  Final   Organism ID, Bacteria ESCHERICHIA COLI (A)  Final      Susceptibility   Escherichia coli - MIC*    AMPICILLIN >=32 RESISTANT Resistant     CEFAZOLIN <=4 SENSITIVE Sensitive     CEFTRIAXONE <=1 SENSITIVE Sensitive     CIPROFLOXACIN >=4 RESISTANT Resistant     GENTAMICIN <=1 SENSITIVE Sensitive     IMIPENEM <=0.25 SENSITIVE Sensitive     NITROFURANTOIN <=16 SENSITIVE Sensitive     TRIMETH/SULFA <=20 SENSITIVE Sensitive     AMPICILLIN/SULBACTAM 16 INTERMEDIATE Intermediate  PIP/TAZO <=4 SENSITIVE Sensitive     Extended ESBL NEGATIVE Sensitive     * >=100,000 COLONIES/mL ESCHERICHIA COLI  Blood Culture (routine x 2)     Status: None (Preliminary result)   Collection Time: 08/28/17  8:28 AM  Result Value Ref Range Status   Specimen Description BLOOD RIGHT FOREARM  Final   Special Requests   Final    BOTTLES DRAWN AEROBIC AND ANAEROBIC Blood Culture results may not be optimal due to an inadequate volume of blood received in culture bottles   Culture NO GROWTH 4 DAYS  Final   Report Status PENDING  Incomplete  Blood Culture (routine x 2)     Status: None (Preliminary result)   Collection Time: 08/28/17  8:49 AM  Result Value Ref Range Status   Specimen Description BLOOD RIGHT HAND  Final   Special Requests   Final    BOTTLES DRAWN AEROBIC AND ANAEROBIC Blood Culture results may not be optimal due to an inadequate volume of blood received in culture bottles   Culture NO GROWTH 4 DAYS  Final   Report Status PENDING  Incomplete         Radiology Studies: No results found.      Scheduled Meds: . amLODipine  10 mg Oral Daily  . aspirin  81 mg Oral Daily  . carvedilol  6.25 mg Oral BID WC  . furosemide  80 mg Intravenous BID  . heparin  subcutaneous  5,000 Units Subcutaneous Q8H  . hydrALAZINE  100 mg Oral Q8H  . insulin aspart  0-15 Units Subcutaneous TID WC  . insulin aspart  0-5 Units Subcutaneous QHS  . insulin glargine  10 Units Subcutaneous Daily  . pantoprazole  40 mg Oral Daily  . potassium chloride  20 mEq Oral BID  . sodium chloride flush  3 mL Intravenous Q12H   Continuous Infusions: . sodium chloride    . sodium chloride    . cefTRIAXone (ROCEPHIN)  IV Stopped (08/31/17 2227)     LOS: 4 days    Time spent: 35 minutes.     Madison Shiley, MD [facility] Pager [phone]  If 7PM-7AM, please contact night-coverage [email or web address] Password [facility] 09/01/2017, 10:58 AM

## 2017-09-01 NOTE — Progress Notes (Signed)
Patient bathed. Regions of skin with MASD in groin, under panis, and in breast folds cleansed and patted dry. Anti-fungal powder and interdry applied to skin folds.

## 2017-09-01 NOTE — Clinical Social Work Note (Signed)
Clinical Social Work Assessment  Patient Details  Name: Madison Reed MRN: [id number] Date of Birth: January 01, 1951  Date of referral:  09/01/17               Reason for consult:  Facility Placement                Permission sought to share information with:  Family Supports Permission granted to share information::  Yes, Verbal Permission Granted  Name::     Madison Reed  Agency::     Relationship::  spouse  Contact Information:  [phone]  Housing/Transportation Living arrangements for the past 2 months:  Single Family Home Source of Information:  Patient Patient Interpreter Needed:  None Criminal Activity/Legal Involvement Pertinent to Current Situation/Hospitalization:  No - Comment as needed Significant Relationships:  Adult Children, Spouse Lives with:  Spouse Do you feel safe going back to the place where you live?  Yes Need for family participation in patient care:  Yes (Comment)  Care giving concerns: Patient from home with spouse. Patient is bed bound. PT recommending SNF.   Social Worker assessment / plan: CSW met with patient at bedside and discussed disposition planning. Patient reports she has not been able to walk for approximately three months and does not know why. Patient indicated her spouse and daughter care for her at home. No family was at bedside, CSW left voicemail x2 for spouse and left voicemail for daughter. CSW to follow and support with disposition planning.  Employment status:  Retired Presenter, broadcasting) PT Recommendations:  [place] / Referral to community resources:  [place]  Patient/Family's Response to care: Not discussed.  Patient/Family's Understanding of and Emotional Response to Diagnosis, Current Treatment, and Prognosis: Not discussed.  Emotional Assessment Appearance:  Appears stated age Attitude/Demeanor/Rapport:  Lethargic Affect (typically observed):   Calm Orientation:  Oriented to Self, Oriented to Place, Oriented to  Time, Oriented to Situation Alcohol / Substance use:  Not Applicable Psych involvement (Current and /or in the community):  No (Comment)  Discharge Needs  Concerns to be addressed:  Discharge Planning Concerns Readmission within the last 30 days:  No Current discharge risk:  Physical Impairment Barriers to Discharge:  Continued Medical Work up   Estanislado Emms, LCSW 09/01/2017, 11:45 AM

## 2017-09-01 NOTE — NC FL2 (Signed)
[place] LEVEL OF CARE SCREENING TOOL     IDENTIFICATION  Patient Name: Madison Reed Birthdate: [birth year] Sex: female Admission Date (Current Location): 08/28/2017  [facility] and [place] Number:  Herbalist and Address:  [facility] [place]. [facility], [place] [address], [place], [place] [address]      Provider Number: [id number]  Attending Physician Name and Address:  Elmarie Shiley, MD  Relative Name and Phone Number:  Kenita Bines, spouse, [phone]    Current Level of Care: Hospital Recommended Level of Care: [place] Prior Approval Number:    Date Approved/Denied:   PASRR Number: [id number]  Discharge Plan: SNF    Current Diagnoses: Patient Active Problem List   Diagnosis Date Noted  . Elevated troponin   . Sepsis ([place]) 08/28/2017  . UTI (urinary tract infection) 08/28/2017  . CKD (chronic kidney disease) stage 4, GFR 15-29 ml/min (HCC) 08/28/2017  . Acute on chronic respiratory failure with hypoxia ([place]) 08/28/2017  . Encephalopathy 08/28/2017  . Anemia 08/28/2017  . NSTEMI (non-ST elevated myocardial infarction) ([place]) 08/28/2017  . Acute cystitis without hematuria   . Stage II pressure ulcer of sacral region 06/14/2017  . Demand ischemia ([place]) 06/13/2017  . Essential hypertension 06/13/2017  . Lobar pneumonia ([place]) 06/10/2017  . Pressure injury of skin 06/10/2017  . Troponin level elevated 06/10/2017  . DM type 2 (diabetes mellitus, type 2) ([place]) 06/10/2017  . Chronic respiratory failure with hypoxia ([place]) 06/10/2017  . Acute on chronic diastolic CHF (congestive heart failure) ([place]) 05/27/2017  . Dyspnea   . AKI (acute kidney injury) ([place]) 04/30/2017  . Hypoglycemia 04/30/2017  . Septic arthritis of IP joint of toe ([place])   . DM type 2, uncontrolled, with lower extremity ulcer ([place]) 03/23/2015  . Diabetic ulcer of left foot ([place])   . Weakness of right arm/hand 09/18/2013  . Unstable balance 08/11/2013  .  Foot and toe(s), blister, infected 08/11/2013  . Difficulty walking 08/11/2013  . Weakness of right leg 08/11/2013  . Acute ischemic stroke ([place]) 08/03/2013  . Hypokalemia 08/03/2013  . Type 2 diabetes with nephropathy ([place])   . Neuropathy   . Chronic back pain   . Psoriasis   . Wound, open, foot     Orientation RESPIRATION BLADDER Height & Weight     Self, Time, Situation, Place  O2(nasal cannula 4L) Incontinent Weight: 202 lb 3.2 oz (91.7 kg) Height:  5\' 6"  (167.6 cm)  BEHAVIORAL SYMPTOMS/MOOD NEUROLOGICAL BOWEL NUTRITION STATUS      Incontinent Diet(heart healthy/carb modified; please see DC summary)  AMBULATORY STATUS COMMUNICATION OF NEEDS Skin   Total Care Verbally PU Stage and Appropriate Care(PU stage II L leg, foam; PU unstageable L heel, foam; PU stage I R heel, foam; PU stage II sacrum, foam)                       Personal Care Assistance Level of Assistance  Bathing, Feeding, Dressing Bathing Assistance: Maximum assistance Feeding assistance: Limited assistance Dressing Assistance: Maximum assistance     Functional Limitations Info  Sight, Hearing, Speech Sight Info: Adequate Hearing Info: Adequate Speech Info: Adequate    SPECIAL CARE FACTORS FREQUENCY  PT (By licensed PT), OT (By licensed OT)     PT Frequency: 5x/week OT Frequency: 5x/week            Contractures Contractures Info: Not present    Additional Factors Info  Code Status, Allergies, Insulin Sliding  Scale Code Status Info: Full Allergies Info: Glipizide, Penicillins, Clonidine Derivatives, Codeine, Hydrocodone-acetaminophen, Plasticized Base Plastibase, Shellfish Allergy, Tape, Sulfa Antibiotics   Insulin Sliding Scale Info: insulin novolog 3x/day with meals and at bedtime, lantus 1x/day       Current Medications (09/01/2017):  This is the current hospital active medication list Current Facility-Administered Medications  Medication Dose Route Frequency Provider Last Rate Last  Dose  . 0.9 %  sodium chloride infusion  250 mL Intravenous PRN Kathie Dike, MD      . 0.9 %  sodium chloride infusion   Intravenous Once Mariel Aloe, MD      . acetaminophen (TYLENOL) tablet 650 mg  650 mg Oral Q4H PRN Kathie Dike, MD      . amLODipine (NORVASC) tablet 10 mg  10 mg Oral Daily Mariel Aloe, MD   10 mg at 09/01/17 0850  . aspirin chewable tablet 81 mg  81 mg Oral Daily Nila Nephew, MD   81 mg at 09/01/17 0851  . carvedilol (COREG) tablet 6.25 mg  6.25 mg Oral BID WC Kathie Dike, MD   6.25 mg at 09/01/17 0849  . cefTRIAXone (ROCEPHIN) 1 g in dextrose 5 % 50 mL IVPB  1 g Intravenous Q24H Mariel Aloe, MD   Stopped at 08/31/17 2227  . furosemide (LASIX) injection 80 mg  80 mg Intravenous BID Sueanne Margarita, MD   80 mg at 09/01/17 0846  . heparin injection 5,000 Units  5,000 Units Subcutaneous Q8H Burnell Blanks, MD   5,000 Units at 09/01/17 0522  . hydrALAZINE (APRESOLINE) tablet 100 mg  100 mg Oral Q8H Mariel Aloe, MD   100 mg at 09/01/17 0522  . insulin aspart (novoLOG) injection 0-15 Units  0-15 Units Subcutaneous TID WC Kathie Dike, MD   3 Units at 08/31/17 1158  . insulin aspart (novoLOG) injection 0-5 Units  0-5 Units Subcutaneous QHS Kathie Dike, MD      . insulin glargine (LANTUS) injection 10 Units  10 Units Subcutaneous Daily Mariel Aloe, MD   10 Units at 09/01/17 0847  . ondansetron (ZOFRAN) injection 4 mg  4 mg Intravenous Q6H PRN Kathie Dike, MD      . pantoprazole (PROTONIX) EC tablet 40 mg  40 mg Oral Daily Kathie Dike, MD   40 mg at 09/01/17 0851  . potassium chloride SA (K-DUR,KLOR-CON) CR tablet 20 mEq  20 mEq Oral BID Mariel Aloe, MD   20 mEq at 09/01/17 0850  . sodium chloride flush (NS) 0.9 % injection 3 mL  3 mL Intravenous Q12H Kathie Dike, MD   3 mL at 09/01/17 0856  . sodium chloride flush (NS) 0.9 % injection 3 mL  3 mL Intravenous PRN Kathie Dike, MD         Discharge  Medications: Please see discharge summary for a list of discharge medications.  Relevant Imaging Results:  Relevant Lab Results:   Additional Information SSN: [id number]  Estanislado Emms, LCSW

## 2017-09-01 NOTE — Progress Notes (Signed)
Progress Note  Patient Name: Madison Reed Date of Encounter: 09/01/2017  Primary Cardiologist: Radford Pax  Subjective   No chest pain, breathing is improving.   Inpatient Medications    Scheduled Meds: . amLODipine  10 mg Oral Daily  . aspirin  81 mg Oral Daily  . carvedilol  6.25 mg Oral BID WC  . furosemide  80 mg Intravenous BID  . heparin subcutaneous  5,000 Units Subcutaneous Q8H  . hydrALAZINE  100 mg Oral Q8H  . insulin aspart  0-15 Units Subcutaneous TID WC  . insulin aspart  0-5 Units Subcutaneous QHS  . insulin glargine  10 Units Subcutaneous Daily  . pantoprazole  40 mg Oral Daily  . potassium chloride  20 mEq Oral BID  . sodium chloride flush  3 mL Intravenous Q12H   Continuous Infusions: . sodium chloride    . sodium chloride    . cefTRIAXone (ROCEPHIN)  IV Stopped (08/31/17 2227)   PRN Meds: sodium chloride, acetaminophen, ondansetron (ZOFRAN) IV, sodium chloride flush   Vital Signs    Vitals:   08/31/17 2125 08/31/17 2359 09/01/17 0448 09/01/17 0849  BP: (!) 157/62 (!) 155/59 (!) 148/59 (!) 170/64  Pulse: 74  80   Resp: (!) 22 20 15    Temp: 98.9 F (37.2 C) 98.6 F (37 C) 98.6 F (37 C)   TempSrc: Oral Oral Oral   SpO2: 97% 95% 100%   Weight:   202 lb 3.2 oz (91.7 kg)   Height:        Intake/Output Summary (Last 24 hours) at 09/01/2017 2751 Last data filed at 09/01/2017 0452 Gross per 24 hour  Intake 650 ml  Output 2525 ml  Net -1875 ml   Filed Weights   08/30/17 0419 08/31/17 0515 09/01/17 0448  Weight: 221 lb 12.5 oz (100.6 kg) 203 lb 12.8 oz (92.4 kg) 202 lb 3.2 oz (91.7 kg)    Telemetry    SR - Personally Reviewed  Physical Exam   General: Well developed, well nourished, older female appearing in no acute distress. Head: Normocephalic, atraumatic.  Neck: Supple without bruits, JVD. Lungs:  Resp regular and unlabored, CTA. Heart: RRR, S1, S2, no S3, S4, 2/6 systolic murmur; no rub. Abdomen: Soft, non-tender, non-distended  with normoactive bowel sounds.  Extremities: No clubbing, cyanosis, trace bilateral LE edema. Distal pedal pulses are 2+ bilaterally. Neuro: Alert and oriented X 3. Moves all extremities spontaneously. Psych: Normal affect.  Labs    Chemistry Recent Labs  Lab 08/28/17 0829 08/30/17 0623 08/31/17 0400 09/01/17 0346  NA 137 138 139 139  K 3.8 2.6* 3.2* 3.5  CL 103 104 102 99*  CO2 26 28 28  32  GLUCOSE 124* 99 90 90  BUN 42* 27* 23* 19  CREATININE 2.98* 2.20* 1.85* 1.62*  CALCIUM 8.3* 7.8* 8.0* 8.2*  PROT 6.6  --   --   --   ALBUMIN 2.7*  --   --   --   AST 41  --   --   --   ALT 14  --   --   --   ALKPHOS 35*  --   --   --   BILITOT 1.2  --   --   --   GFRNONAA 16* 22* 28* 33*  GFRAA 18* 26* 32* 38*  ANIONGAP 8 6 9 8      Hematology Recent Labs  Lab 08/30/17 0623 08/31/17 0400 09/01/17 0346  WBC 4.3 5.4 5.7  RBC 3.34* 3.14*  3.23*  HGB 8.4* 8.2* 8.4*  HCT 27.8* 26.6* 27.5*  MCV 83.2 84.7 85.1  MCH 25.1* 26.1 26.0  MCHC 30.2 30.8 30.5  RDW 16.5* 17.3* 17.3*  PLT 150 159 163    Cardiac Enzymes Recent Labs  Lab 08/28/17 1639 08/29/17 0037 08/31/17 1112  TROPONINI 1.05* 0.44* 0.21*   No results for input(s): TROPIPOC in the last 168 hours.   BNP Recent Labs  Lab 08/28/17 0829  BNP 1,330.0*     DDimer No results for input(s): DDIMER in the last 168 hours.    Radiology    No results found.  Cardiac Studies   TTE: 08/30/17  Left ventricle: The cavity size was normal. There was mild  concentric hypertrophy. Systolic function was normal. The  estimated ejection fraction was in the range of 55% to 60%. Wall  motion was normal; there were no regional wall motion  abnormalities. Features are consistent with a pseudonormal left  ventricular filling pattern, with concomitant abnormal relaxation  and increased filling pressure (grade 2 diastolic dysfunction).  - Mitral valve: There was moderate regurgitation directed  eccentrically and posteriorly.    - Left atrium: The atrium was mildly dilated.  - Right ventricle: The cavity size was moderately dilated.  - Pericardium, extracardiac: A small pericardial effusion was  identified circumferential, but mostly posterior to the heart.  There was no evidence of hemodynamic compromise.  Impressions:  - No significant change from July 2018 study.   Patient Profile     66 y.o. female with a hx of HFpEF, DM2, HTN, prior strokewho [facility], where she presented with heart failure exacerbation. Cardiology was consulted for positive troponin.   Assessment & Plan    1. Acute on chronic diastolic CHF: She has normal LV systolic function by echo 08/30/17. She has diuresed well with 9.3 liters net negative since admission. Weight continues to trend down. -Will continue IV Lasix today with plans to transition to oral in the am.  2.  Elevated troponin: This was felt to be due to demand ischemia in the setting of UTI. Plan for nuc stress testing prior to discharge per Dr. Camillia Herter note. She has no chest pain. Heparin stopped yesterday.    3.  HTN: BP is controlled.    4.  Acute on chronic kidney disease stage IV: Renal function is improving with diuresis.   5. Hypokalemia: Potassium is being replaced this am.   Signed, Reino Bellis, NP  09/01/2017, 9:37 AM  Pager # [phone]   For questions or updates, please contact [place] Please consult [email or web address] for contact info under Cardiology/STEMI.  I have personally seen and examined this patient with Reino Bellis, NP. I agree with the assessment and plan as outlined above. She is much closer to euvolemia. I agree with one more day of IV Lasix. May be able to change to po Lasix tomorrow. She had elevated troponin in setting of UTI and volume overload. I think we should probably plan outpatient stress testing. She is still very somnolent and seems to have slow speech.   Lauree Chandler 09/01/2017 1:08 PM

## 2017-09-01 NOTE — Progress Notes (Signed)
CSW reached patient's son and daughter in the afternoon and discussed discharge planning. CSW informed family that patient may discharge tomorrow and gave SNF bed offers. Family considering if they would prefer SNF or to bring patient home. Daughter and spouse have provided 24/7 care for patient, as patient is mostly bed bound. Daughter indicated she would discuss with patient's spouse and follow up with CSW. CSW to follow and support with discharge.  Estanislado Emms, [place]

## 2017-09-02 ENCOUNTER — Inpatient Hospital Stay (HOSPITAL_COMMUNITY): Payer: Medicare Other

## 2017-09-02 DIAGNOSIS — I639 Cerebral infarction, unspecified: Secondary | ICD-10-CM

## 2017-09-02 LAB — CULTURE, BLOOD (ROUTINE X 2)
Culture: NO GROWTH
Culture: NO GROWTH

## 2017-09-02 LAB — CBC
HEMATOCRIT: 26.7 % — AB (ref 36.0–46.0)
HEMOGLOBIN: 7.9 g/dL — AB (ref 12.0–15.0)
MCH: 25.2 pg — ABNORMAL LOW (ref 26.0–34.0)
MCHC: 29.6 g/dL — AB (ref 30.0–36.0)
MCV: 85 fL (ref 78.0–100.0)
Platelets: 158 10*3/uL (ref 150–400)
RBC: 3.14 MIL/uL — ABNORMAL LOW (ref 3.87–5.11)
RDW: 17.3 % — AB (ref 11.5–15.5)
WBC: 6.1 10*3/uL (ref 4.0–10.5)

## 2017-09-02 LAB — BASIC METABOLIC PANEL
ANION GAP: 7 (ref 5–15)
BUN: 19 mg/dL (ref 6–20)
CHLORIDE: 99 mmol/L — AB (ref 101–111)
CO2: 34 mmol/L — ABNORMAL HIGH (ref 22–32)
CREATININE: 1.63 mg/dL — AB (ref 0.44–1.00)
Calcium: 8.1 mg/dL — ABNORMAL LOW (ref 8.9–10.3)
GFR calc Af Amer: 37 mL/min — ABNORMAL LOW (ref >60)
GFR calc non Af Amer: 32 mL/min — ABNORMAL LOW (ref >60)
Glucose, Bld: 101 mg/dL — ABNORMAL HIGH (ref 65–99)
POTASSIUM: 3.5 mmol/L (ref 3.5–5.1)
SODIUM: 140 mmol/L (ref 135–145)

## 2017-09-02 LAB — GLUCOSE, CAPILLARY
GLUCOSE-CAPILLARY: 106 mg/dL — AB (ref 65–99)
GLUCOSE-CAPILLARY: 119 mg/dL — AB (ref 65–99)
GLUCOSE-CAPILLARY: 123 mg/dL — AB (ref 65–99)
Glucose-Capillary: 113 mg/dL — ABNORMAL HIGH (ref 65–99)

## 2017-09-02 LAB — TSH: TSH: 2.349 u[IU]/mL (ref 0.350–4.500)

## 2017-09-02 MED ORDER — SODIUM CHLORIDE 0.9 % IV SOLN
510.0000 mg | Freq: Once | INTRAVENOUS | Status: AC
  Administered 2017-09-02: 510 mg via INTRAVENOUS
  Filled 2017-09-02: qty 17

## 2017-09-02 MED ORDER — THIAMINE HCL 100 MG/ML IJ SOLN
500.0000 mg | Freq: Three times a day (TID) | INTRAVENOUS | Status: DC
  Administered 2017-09-03 – 2017-09-04 (×4): 500 mg via INTRAVENOUS
  Filled 2017-09-02 (×7): qty 5

## 2017-09-02 MED ORDER — SIMVASTATIN 20 MG PO TABS
20.0000 mg | ORAL_TABLET | Freq: Every day | ORAL | Status: DC
  Administered 2017-09-02 – 2017-09-03 (×2): 20 mg via ORAL
  Filled 2017-09-02 (×2): qty 1

## 2017-09-02 MED ORDER — ORAL CARE MOUTH RINSE
15.0000 mL | Freq: Two times a day (BID) | OROMUCOSAL | Status: DC
  Administered 2017-09-02 – 2017-09-04 (×5): 15 mL via OROMUCOSAL

## 2017-09-02 MED ORDER — CHLORHEXIDINE GLUCONATE 0.12 % MT SOLN
15.0000 mL | Freq: Two times a day (BID) | OROMUCOSAL | Status: DC
  Administered 2017-09-02 – 2017-09-04 (×5): 15 mL via OROMUCOSAL
  Filled 2017-09-02 (×5): qty 15

## 2017-09-02 NOTE — Consult Note (Signed)
Referring Physician: Dr. Tyrell Antonio    Chief Complaint: Acute stroke seen on MRI  HPI: Madison Reed is an 66 y.o. female who presented on 11/17 after being found by her husband unresponsive. She was diagnosed with CHF exacerbation and UTI. Her encephalopathy was thought most likely to be secondary to UTI. AKI, hypoxia and anemia were also likely to have been contributing factors.  Today, her son noted that she had improved since admission, but noticed that she was drooling. An MRI was then obtained, revealing restricted diffusion throughout much of the corpus callosum. Although somewhat rare, infarct is favored given there are small bilateral cerebral white matter infarcts and advanced intracranial atherosclerosis on 2014 MRA. Marchifava-Bignami is not favored but does cause restricted diffusion in the corpus callosum. Also noted were remote right cerebellar and right lateral lenticulostriate infarcts.  Current active problems include DM type 2, uncontrolled, with lower extremity ulcer, AKI on CKD, acute on chronic diastolic CHF, HTN, UTI, acute on chronic respiratory failure with hypoxia, encephalopathy, hypokalemia, anemia and NSTEMI.   Past Medical History:  Diagnosis Date  . Abdominal aneurysm ([place])   . Bone spur   . Chronic back pain   . CVA (cerebral infarction)   . Diabetes mellitus without complication ([place])   . Diastolic heart failure ([place])   . Hypertension   . Kidney stones   . Neuropathy   . Psoriasis   . Stroke ([place])   . Wound, open, foot     Past Surgical History:  Procedure Laterality Date  . ABDOMINAL HYSTERECTOMY    . BLADDER SUSPENSION    . VESICOVAGINAL FISTULA CLOSURE W/ TAH      Family History  Problem Relation Age of Onset  . Aneurysm Mother   . Diabetes Mother   . Hypertension Mother   . Cancer Neg Hx   . Heart disease Neg Hx   . Stroke Neg Hx    Social History:  reports that she has quit smoking. She has a 30.00 pack-year smoking history. she has  never used smokeless tobacco. She reports that she does not drink alcohol or use drugs.  Allergies:  Allergies  Allergen Reactions  . Glipizide     Severe hypoglycemia (22!)  . Penicillins Itching    Tolerated Cefepime Has patient had a PCN reaction causing immediate rash, facial/tongue/throat swelling, SOB or lightheadedness with hypotension: Yes Has patient had a PCN reaction causing severe rash involving mucus membranes or skin necrosis: No Has patient had a PCN reaction that required hospitalization: No Has patient had a PCN reaction occurring within the last 10 years: No If all of the above answers are "NO", then may proceed with Cephalosporin use.   . Clonidine Derivatives Other (See Comments)    Numbness  to tongue face, and arm  . Codeine Itching  . Hydrocodone-Acetaminophen Itching  . Plasticized Base [Plastibase]   . Shellfish Allergy Itching  . Tape   . Sulfa Antibiotics Itching and Rash    Medications:  Scheduled: . amLODipine  10 mg Oral Daily  . aspirin  81 mg Oral Daily  . carvedilol  6.25 mg Oral BID WC  . chlorhexidine  15 mL Mouth Rinse BID  . furosemide  80 mg Intravenous BID  . heparin subcutaneous  5,000 Units Subcutaneous Q8H  . hydrALAZINE  100 mg Oral Q8H  . insulin aspart  0-15 Units Subcutaneous TID WC  . insulin aspart  0-5 Units Subcutaneous QHS  . insulin glargine  10 Units Subcutaneous Daily  .  mouth rinse  15 mL Mouth Rinse q12n4p  . pantoprazole  40 mg Oral Daily  . potassium chloride  20 mEq Oral BID  . simvastatin  20 mg Oral q1800  . sodium chloride flush  3 mL Intravenous Q12H   Continuous: . sodium chloride    . cefTRIAXone (ROCEPHIN)  IV Stopped (09/01/17 2245)   KCL:EXNTZG chloride, acetaminophen, ondansetron (ZOFRAN) IV, sodium chloride flush  ROS: Unable to obtain reliable ROS due to confusion.    Physical Examination: Blood pressure (!) 165/58, pulse 73, temperature 98.6 F (37 C), temperature source Oral, resp. rate 18,  height 5' 6"  (1.676 m), weight 92.1 kg (203 lb 1.6 oz), SpO2 97 %.  HEENT: [place]/AT Lungs: Respirations unlabored Ext: Warm and well perfused  Neurologic Examination: Mental Status: Awake and alert. Oriented to year, month, city and state, but not day. Speech fluent and non-dysarthric. Decreased prosodic content and facial expressivity noted. Unable to correctly perform a directional 2-step command. Some difficulty with naming. Unable to correctly repeat a phrase. Poor insight and memory for recent events.  Cranial Nerves: II:  Visual fields intact. PERRL. III,IV, VI: EOMI full with saccadic smooth pursuits noted. No nystagmus. No ptosis.  V,VII: Mild decrease left NL fold. Facial light touch sensation normal bilaterally VIII: hearing intact to questions and commands IX,X: No hypophonia XI: Symmetric XII: midline tongue extension without atrophy or fasciculations Motor: RUE: 5/5 RLE Unable to flex at hip antigravity. 4-/5 knee extension. LUE: 4/5 deltoid and grlp.  LLE: 3/5 hip flexion, 4+/5 knee extension.  Sensory: FT intact x 4 without extinction.  Deep Tendon Reflexes: Normoactive upper extremities and patellae.  Cerebellar: No ataxia with FNF bilaterally.  Gait: Deferred due to falls risk concerns.   Results for orders placed or performed during the hospital encounter of 08/28/17 (from the past 48 hour(s))  Glucose, capillary     Status: Abnormal   Collection Time: 08/31/17  9:26 PM  Result Value Ref Range   Glucose-Capillary 106 (H) 65 - 99 mg/dL  Basic metabolic panel     Status: Abnormal   Collection Time: 09/01/17  3:46 AM  Result Value Ref Range   Sodium 139 135 - 145 mmol/L   Potassium 3.5 3.5 - 5.1 mmol/L   Chloride 99 (L) 101 - 111 mmol/L   CO2 32 22 - 32 mmol/L   Glucose, Bld 90 65 - 99 mg/dL   BUN 19 6 - 20 mg/dL   Creatinine, Ser 1.62 (H) 0.44 - 1.00 mg/dL   Calcium 8.2 (L) 8.9 - 10.3 mg/dL   GFR calc non Af Amer 33 (L) >60 mL/min   GFR calc Af Amer 38 (L) >60  mL/min    Comment: (NOTE) The eGFR has been calculated using the CKD EPI equation. This calculation has not been validated in all clinical situations. eGFR's persistently <60 mL/min signify possible Chronic Kidney Disease.    Anion gap 8 5 - 15  CBC     Status: Abnormal   Collection Time: 09/01/17  3:46 AM  Result Value Ref Range   WBC 5.7 4.0 - 10.5 K/uL   RBC 3.23 (L) 3.87 - 5.11 MIL/uL   Hemoglobin 8.4 (L) 12.0 - 15.0 g/dL   HCT 27.5 (L) 36.0 - 46.0 %   MCV 85.1 78.0 - 100.0 fL   MCH 26.0 26.0 - 34.0 pg   MCHC 30.5 30.0 - 36.0 g/dL   RDW 17.3 (H) 11.5 - 15.5 %   Platelets 163 150 - 400  K/uL  Heparin level (unfractionated)     Status: Abnormal   Collection Time: 09/01/17  3:46 AM  Result Value Ref Range   Heparin Unfractionated <0.10 (L) 0.30 - 0.70 IU/mL    Comment:        IF HEPARIN RESULTS ARE BELOW EXPECTED VALUES, AND PATIENT DOSAGE HAS BEEN CONFIRMED, SUGGEST FOLLOW UP TESTING OF ANTITHROMBIN III LEVELS.   Glucose, capillary     Status: None   Collection Time: 09/01/17  7:41 AM  Result Value Ref Range   Glucose-Capillary 91 65 - 99 mg/dL  Glucose, capillary     Status: Abnormal   Collection Time: 09/01/17 11:10 AM  Result Value Ref Range   Glucose-Capillary 124 (H) 65 - 99 mg/dL  Glucose, capillary     Status: Abnormal   Collection Time: 09/01/17  3:43 PM  Result Value Ref Range   Glucose-Capillary 120 (H) 65 - 99 mg/dL  Glucose, capillary     Status: Abnormal   Collection Time: 09/01/17  9:06 PM  Result Value Ref Range   Glucose-Capillary 115 (H) 65 - 99 mg/dL  Basic metabolic panel     Status: Abnormal   Collection Time: 09/02/17  3:05 AM  Result Value Ref Range   Sodium 140 135 - 145 mmol/L   Potassium 3.5 3.5 - 5.1 mmol/L   Chloride 99 (L) 101 - 111 mmol/L   CO2 34 (H) 22 - 32 mmol/L   Glucose, Bld 101 (H) 65 - 99 mg/dL   BUN 19 6 - 20 mg/dL   Creatinine, Ser 1.63 (H) 0.44 - 1.00 mg/dL   Calcium 8.1 (L) 8.9 - 10.3 mg/dL   GFR calc non Af Amer 32  (L) >60 mL/min   GFR calc Af Amer 37 (L) >60 mL/min    Comment: (NOTE) The eGFR has been calculated using the CKD EPI equation. This calculation has not been validated in all clinical situations. eGFR's persistently <60 mL/min signify possible Chronic Kidney Disease.    Anion gap 7 5 - 15  CBC     Status: Abnormal   Collection Time: 09/02/17  3:05 AM  Result Value Ref Range   WBC 6.1 4.0 - 10.5 K/uL   RBC 3.14 (L) 3.87 - 5.11 MIL/uL   Hemoglobin 7.9 (L) 12.0 - 15.0 g/dL   HCT 26.7 (L) 36.0 - 46.0 %   MCV 85.0 78.0 - 100.0 fL   MCH 25.2 (L) 26.0 - 34.0 pg   MCHC 29.6 (L) 30.0 - 36.0 g/dL   RDW 17.3 (H) 11.5 - 15.5 %   Platelets 158 150 - 400 K/uL  Glucose, capillary     Status: Abnormal   Collection Time: 09/02/17  7:31 AM  Result Value Ref Range   Glucose-Capillary 106 (H) 65 - 99 mg/dL  Glucose, capillary     Status: Abnormal   Collection Time: 09/02/17 11:29 AM  Result Value Ref Range   Glucose-Capillary 119 (H) 65 - 99 mg/dL  TSH     Status: None   Collection Time: 09/02/17  2:00 PM  Result Value Ref Range   TSH 2.349 0.350 - 4.500 uIU/mL    Comment: Performed by a 3rd Generation assay with a functional sensitivity of <=0.01 uIU/mL.  Glucose, capillary     Status: Abnormal   Collection Time: 09/02/17  4:16 PM  Result Value Ref Range   Glucose-Capillary 113 (H) 65 - 99 mg/dL   Mr Brain Wo Contrast  Result Date: 09/02/2017 CLINICAL DATA:  Altered level  of consciousness. Patient found unresponsive 08/2016. EXAM: MRI HEAD WITHOUT CONTRAST TECHNIQUE: Multiplanar, multiecho pulse sequences of the brain and surrounding structures were obtained without intravenous contrast. COMPARISON:  08/03/2013 FINDINGS: Brain: There is restricted diffusion throughout much of the corpus callosum, extending from the rostrum to the body/ splenium, midline to right eccentric. There also bilateral cerebral white matter islands of restricted diffusion and infarcts are favored in this patient with  advanced intracranial atherosclerosis by 2014 MRI. No detected peripheral sparing or confluent bilateral involvement typical for Marchifava-Bignami. Other demyelination disease is also considered unlikely. Microvascular ischemic type changes seen superimposed on the acute findings in the bilateral cerebral white matter. Remote right inferior cerebellar infarct. Remote right lateral lenticulostriate infarct. Vascular: Not evaluated Skull and upper cervical spine: Essentially not evaluated Sinuses/Orbits: Not evaluate Other: Only diffusion and the motion degraded T2 FLAIR sequence were obtained. A call is been placed to the ordering provider. IMPRESSION: 1. Restricted diffusion throughout much of the corpus callosum. Although somewhat rare, infarct is favored given there are small bilateral cerebral white matter infarcts and advanced intracranial atherosclerosis on s 2014 MRA. Marchifava-Bignami is not favored but does cause restricted diffusion in the corpus callosum, please correlate for risk factors. 2. Remote right cerebellar and right lateral lenticulostriate infarcts. 3. Incomplete and motion degraded study. Only diffusion and T2 FLAIR imaging was acquired. Electronically Signed   By: Monte Fantasia M.D.   On: 09/02/2017 16:07   TTE: Left ventricle: The cavity size was normal. There was mild   concentric hypertrophy. Systolic function was normal. The   estimated ejection fraction was in the range of 55% to 60%. Wall   motion was normal; there were no regional wall motion   abnormalities. Features are consistent with a pseudonormal left   ventricular filling pattern, with concomitant abnormal relaxation   and increased filling pressure (grade 2 diastolic dysfunction). - Mitral valve: There was moderate regurgitation directed   eccentrically and posteriorly. - Left atrium: The atrium was mildly dilated. - Right ventricle: The cavity size was moderately dilated. - Pericardium, extracardiac: A small  pericardial effusion was   identified circumferential, but mostly posterior to the heart.   There was no evidence of hemodynamic compromise.  Assessment: 66 y.o. female with acute restricted diffusion involving the corpus callosum on MRI, consistent with cytotoxic edema 1. DDx includes unusual manifestation of ischemic infarction due to distal ACA occlusion, small vessel vasculitis, toxic/metabolic etiology such as Marchiafava-Bignami disease and ischemia due to hypoxia.  2. Multiple comorbidities, as listed in the HPI  Plan: 1. MRA of brain if able to tolerate. 2. TEE to evaluate for mural thrombus 3. Carotid ultrasound 4. Vitamin panel. Include thiamine level 5. Start thiamine supplementation 6. ESR, C-reactive protein, ANA, ANCA 7. Nutrition consult 8. PT consult, OT consult, Speech consult 9. Continue ASA and statin  @Electronically  signed: Dr. Kerney Elbe 09/02/2017, 4:34 PM

## 2017-09-02 NOTE — Progress Notes (Addendum)
PROGRESS NOTE    Madison Reed  ACZ:[id number] DOB: [birth year] DOA: 08/28/2017 PCP: Celene Squibb, MD    Brief Narrative: Madison Reed is a 66 y.o. femalewith medical history significant ofdiastolic heart failure, diabetes, chronic respiratory failure on home oxygen. Patient presented with CHF exacerbation and possible UTI and was encephalopathy. Currently being diuresed and empiric treatment with Vancomycin and Cefepime.     Assessment & Plan:   Active Problems:   DM type 2, uncontrolled, with lower extremity ulcer ([place])   AKI (acute kidney injury) ([place])   Acute on chronic diastolic CHF (congestive heart failure) ([facility])   Troponin level elevated   Essential hypertension   Acute cystitis without hematuria   UTI (urinary tract infection)   CKD (chronic kidney disease) stage 4, GFR 15-29 ml/min ([facility])   Acute on chronic respiratory failure with hypoxia ([facility])   Encephalopathy   Anemia   NSTEMI (non-ST elevated myocardial infarction) ([facility])   Elevated troponin   Urinary tract infection Urine culture pending. E. Coli. -Continue ceftriaxone 1g daily. Change to oral at discharge. Had foley catheter . Remove catheter   Encephalopathy Presumed metabolic secondary to urinary tract infection. ABG and ammonia unremarkable.  -continue to monitor -PT/OT eval -per son patient has improved, but they notice drooling mouth. Will get MRI brain.  TSH normal, B 12 not low.  MRI positive for acute stroke. Neuro consulted. Will follow recommendation. Stated statins.   Acute on chronic diastolic CHF Hypervolemic on exam. Pleural effusions and interstitial edema on chest x-ray. 4.8L UOP over the last 24 hours with weight now down 8.2 kg from yesterday (4.8 kg from two days ago).  -She is negative 8.3L since admission. -change to oral lasix today  -remove foley catheter   Acute kidney injury on CKD stage IV Baseline of 1.7. Up to 2.98 on admission. Likely secondary to cardiorenal disease.  Improving. -diuresis as above Cr trending down.   Acute on chronic respiratory failure Per notes, states patient on 2 L chronically, but patient tells me 4 L chronically. Currently on 4 L and stable.  Diabetes mellitus -Continue home Lantus at 20% decreased dose of 10 units. -SSI -Heart healthy/carb modified diet  Anemia, iron deficiency.  S/p 1 unit PRBC on 11/18 with now stable hemoglobin. No evidence of bleeding. Hd decreased. Will give IV iron.  Iron; 20, sat ratio 6,  Repeat hb in am.   Elevated troponin In setting of CHF exacerbation. -Cardiology recommendations: nuclear stress out patient.   Hypokalemia Resolved.       DVT prophylaxis: heparin  Code Status: full code.  Family Communication: son who was at bedside.  Disposition Plan: home vs SNF   Consultants:   Cardiology    Procedures:   none   Antimicrobials:   Ceftriaxone 11-19  Vancomycin (11/17>>11/18)  Cefepime (11/17>>11/18)  Levaquin (11/17)  Aztreonam (11/17)  Ceftriaxone (11/19>>     Subjective: She is alert, answer yes and no questions, per son she is almost back to baseline.  They have notice drooling, left side .  They would like to see how she does with PT.   Objective: Vitals:   09/02/17 0440 09/02/17 0653 09/02/17 0929 09/02/17 1322  BP: (!) 199/74 (!) 141/52 (!) 151/54 (!) 165/58  Pulse: 82 73  73  Resp: 16 18    Temp:    98.6 F (37 C)  TempSrc:    Oral  SpO2: 100% 97%    Weight:      Height:  Intake/Output Summary (Last 24 hours) at 09/02/2017 1530 Last data filed at 09/02/2017 1449 Gross per 24 hour  Intake 650 ml  Output 4625 ml  Net -3975 ml   Filed Weights   08/31/17 0515 09/01/17 0448 09/02/17 0403  Weight: 92.4 kg (203 lb 12.8 oz) 91.7 kg (202 lb 3.2 oz) 92.1 kg (203 lb 1.6 oz)    Examination:  General exam; NAD Respiratory system: Normal respiratory effort, bilateral crackles.  Cardiovascular system: S 1, S 2  RRR Gastrointestinal system: BS present, soft, nt Central nervous system: alert, answer yes an no questions. Moves upper extremities.  Extremities: no edema Skin: No rashes, lesions or ulcers    Data Reviewed: I have personally reviewed following labs and imaging studies  CBC: Recent Labs  Lab 08/28/17 0829 08/29/17 0220  08/29/17 1901 08/30/17 0623 08/31/17 0400 09/01/17 0346 09/02/17 0305  WBC 4.1 4.0  --   --  4.3 5.4 5.7 6.1  NEUTROABS 2.9  --   --   --   --   --   --   --   HGB 7.8* 7.0*   < > 8.3* 8.4* 8.2* 8.4* 7.9*  HCT 26.8* 23.5*   < > 26.7* 27.8* 26.6* 27.5* 26.7*  MCV 85.4 83.0  --   --  83.2 84.7 85.1 85.0  PLT 155 154  --   --  150 159 163 158   < > = values in this interval not displayed.   Basic Metabolic Panel: Recent Labs  Lab 08/28/17 0829 08/30/17 0623 08/31/17 0400 09/01/17 0346 09/02/17 0305  NA 137 138 139 139 140  K 3.8 2.6* 3.2* 3.5 3.5  CL 103 104 102 99* 99*  CO2 26 28 28  32 34*  GLUCOSE 124* 99 90 90 101*  BUN 42* 27* 23* 19 19  CREATININE 2.98* 2.20* 1.85* 1.62* 1.63*  CALCIUM 8.3* 7.8* 8.0* 8.2* 8.1*   GFR: Estimated Creatinine Clearance: 39.9 mL/min (A) (by C-G formula based on SCr of 1.63 mg/dL (H)). Liver Function Tests: Recent Labs  Lab 08/28/17 0829  AST 41  ALT 14  ALKPHOS 35*  BILITOT 1.2  PROT 6.6  ALBUMIN 2.7*   No results for input(s): LIPASE, AMYLASE in the last 168 hours. Recent Labs  Lab 08/28/17 1639  AMMONIA 17   Coagulation Profile: Recent Labs  Lab 08/28/17 0829  INR 1.33   Cardiac Enzymes: Recent Labs  Lab 08/28/17 1639 08/29/17 0037 08/31/17 1112  TROPONINI 1.05* 0.44* 0.21*   BNP (last 3 results) No results for input(s): PROBNP in the last 8760 hours. HbA1C: No results for input(s): HGBA1C in the last 72 hours. CBG: Recent Labs  Lab 09/01/17 1110 09/01/17 1543 09/01/17 2106 09/02/17 0731 09/02/17 1129  GLUCAP 124* 120* 115* 106* 119*   Lipid Profile: No results for input(s):  CHOL, HDL, LDLCALC, TRIG, CHOLHDL, LDLDIRECT in the last 72 hours. Thyroid Function Tests: Recent Labs    09/02/17 1400  TSH 2.349   Anemia Panel: No results for input(s): VITAMINB12, FOLATE, FERRITIN, TIBC, IRON, RETICCTPCT in the last 72 hours. Sepsis Labs: Recent Labs  Lab 08/28/17 0840 08/28/17 1137  LATICACIDVEN 0.63 0.45*    Recent Results (from the past 240 hour(s))  Urine culture     Status: Abnormal   Collection Time: 08/28/17  8:23 AM  Result Value Ref Range Status   Specimen Description URINE, CLEAN CATCH  Final   Special Requests NONE  Final   Culture >=100,000 COLONIES/mL ESCHERICHIA COLI (A)  Final   Report Status 09/01/2017 FINAL  Final   Organism ID, Bacteria ESCHERICHIA COLI (A)  Final      Susceptibility   Escherichia coli - MIC*    AMPICILLIN >=32 RESISTANT Resistant     CEFAZOLIN <=4 SENSITIVE Sensitive     CEFTRIAXONE <=1 SENSITIVE Sensitive     CIPROFLOXACIN >=4 RESISTANT Resistant     GENTAMICIN <=1 SENSITIVE Sensitive     IMIPENEM <=0.25 SENSITIVE Sensitive     NITROFURANTOIN <=16 SENSITIVE Sensitive     TRIMETH/SULFA <=20 SENSITIVE Sensitive     AMPICILLIN/SULBACTAM 16 INTERMEDIATE Intermediate     PIP/TAZO <=4 SENSITIVE Sensitive     Extended ESBL NEGATIVE Sensitive     * >=100,000 COLONIES/mL ESCHERICHIA COLI  Blood Culture (routine x 2)     Status: None   Collection Time: 08/28/17  8:28 AM  Result Value Ref Range Status   Specimen Description BLOOD RIGHT FOREARM  Final   Special Requests   Final    BOTTLES DRAWN AEROBIC AND ANAEROBIC Blood Culture results may not be optimal due to an inadequate volume of blood received in culture bottles   Culture NO GROWTH 5 DAYS  Final   Report Status 09/02/2017 FINAL  Final  Blood Culture (routine x 2)     Status: None   Collection Time: 08/28/17  8:49 AM  Result Value Ref Range Status   Specimen Description BLOOD RIGHT HAND  Final   Special Requests   Final    BOTTLES DRAWN AEROBIC AND ANAEROBIC  Blood Culture results may not be optimal due to an inadequate volume of blood received in culture bottles   Culture NO GROWTH 5 DAYS  Final   Report Status 09/02/2017 FINAL  Final         Radiology Studies: No results found.      Scheduled Meds: . amLODipine  10 mg Oral Daily  . aspirin  81 mg Oral Daily  . carvedilol  6.25 mg Oral BID WC  . chlorhexidine  15 mL Mouth Rinse BID  . furosemide  80 mg Intravenous BID  . heparin subcutaneous  5,000 Units Subcutaneous Q8H  . hydrALAZINE  100 mg Oral Q8H  . insulin aspart  0-15 Units Subcutaneous TID WC  . insulin aspart  0-5 Units Subcutaneous QHS  . insulin glargine  10 Units Subcutaneous Daily  . mouth rinse  15 mL Mouth Rinse q12n4p  . pantoprazole  40 mg Oral Daily  . potassium chloride  20 mEq Oral BID  . sodium chloride flush  3 mL Intravenous Q12H   Continuous Infusions: . sodium chloride    . cefTRIAXone (ROCEPHIN)  IV Stopped (09/01/17 2245)     LOS: 5 days    Time spent: 35 minutes.     Elmarie Shiley, MD [facility] Pager [phone]  If 7PM-7AM, please contact night-coverage [email or web address] Password [email or web address] 09/02/2017, 3:30 PM

## 2017-09-02 NOTE — Progress Notes (Signed)
Progress Note  Patient Name: Madison Reed Date of Encounter: 09/02/2017  Primary Cardiologist: Radford Pax  Subjective   No chest pain or dyspnea  Inpatient Medications    Scheduled Meds: . amLODipine  10 mg Oral Daily  . aspirin  81 mg Oral Daily  . carvedilol  6.25 mg Oral BID WC  . chlorhexidine  15 mL Mouth Rinse BID  . furosemide  80 mg Intravenous BID  . heparin subcutaneous  5,000 Units Subcutaneous Q8H  . hydrALAZINE  100 mg Oral Q8H  . insulin aspart  0-15 Units Subcutaneous TID WC  . insulin aspart  0-5 Units Subcutaneous QHS  . insulin glargine  10 Units Subcutaneous Daily  . mouth rinse  15 mL Mouth Rinse q12n4p  . pantoprazole  40 mg Oral Daily  . potassium chloride  20 mEq Oral BID  . sodium chloride flush  3 mL Intravenous Q12H   Continuous Infusions: . sodium chloride    . cefTRIAXone (ROCEPHIN)  IV Stopped (09/01/17 2245)   PRN Meds: sodium chloride, acetaminophen, ondansetron (ZOFRAN) IV, sodium chloride flush   Vital Signs    Vitals:   09/02/17 0015 09/02/17 0403 09/02/17 0440 09/02/17 0653  BP: (!) 165/64 (!) 180/63 (!) 199/74 (!) 141/52  Pulse: 80 81 82 73  Resp: 20 19 16 18   Temp: 99.8 F (37.7 C) 99.8 F (37.7 C)    TempSrc: Oral Oral    SpO2: 99% 100% 100% 97%  Weight:  203 lb 1.6 oz (92.1 kg)    Height:        Intake/Output Summary (Last 24 hours) at 09/02/2017 0701 Last data filed at 09/02/2017 1324 Gross per 24 hour  Intake 1250 ml  Output 4825 ml  Net -3575 ml   Filed Weights   08/31/17 0515 09/01/17 0448 09/02/17 0403  Weight: 203 lb 12.8 oz (92.4 kg) 202 lb 3.2 oz (91.7 kg) 203 lb 1.6 oz (92.1 kg)    Telemetry    Sinus - Personally Reviewed  Physical Exam    General: Well developed, well nourished, NAD  HEENT: OP clear, mucus membranes moist  SKIN: warm, dry. No rashes. Neuro: No focal deficits  Musculoskeletal: Muscle strength 5/5 all ext  Psychiatric: Mood and affect normal  Neck: No JVD, no carotid bruits,  no thyromegaly, no lymphadenopathy.  Lungs:Clear bilaterally, no wheezes, rhonci, crackles Cardiovascular: Regular rate and rhythm with ectopy. No murmurs, gallops or rubs. Abdomen:Soft. Bowel sounds present. Non-tender.  Extremities: No lower extremity edema-this has resolved. Pulses are 2 + in the bilateral DP/PT.    Labs    Chemistry Recent Labs  Lab 08/28/17 0829  08/31/17 0400 09/01/17 0346 09/02/17 0305  NA 137   < > 139 139 140  K 3.8   < > 3.2* 3.5 3.5  CL 103   < > 102 99* 99*  CO2 26   < > 28 32 34*  GLUCOSE 124*   < > 90 90 101*  BUN 42*   < > 23* 19 19  CREATININE 2.98*   < > 1.85* 1.62* 1.63*  CALCIUM 8.3*   < > 8.0* 8.2* 8.1*  PROT 6.6  --   --   --   --   ALBUMIN 2.7*  --   --   --   --   AST 41  --   --   --   --   ALT 14  --   --   --   --  ALKPHOS 35*  --   --   --   --   BILITOT 1.2  --   --   --   --   GFRNONAA 16*   < > 28* 33* 32*  GFRAA 18*   < > 32* 38* 37*  ANIONGAP 8   < > 9 8 7    < > = values in this interval not displayed.     Hematology Recent Labs  Lab 08/31/17 0400 09/01/17 0346 09/02/17 0305  WBC 5.4 5.7 6.1  RBC 3.14* 3.23* 3.14*  HGB 8.2* 8.4* 7.9*  HCT 26.6* 27.5* 26.7*  MCV 84.7 85.1 85.0  MCH 26.1 26.0 25.2*  MCHC 30.8 30.5 29.6*  RDW 17.3* 17.3* 17.3*  PLT 159 163 158    Cardiac Enzymes Recent Labs  Lab 08/28/17 1639 08/29/17 0037 08/31/17 1112  TROPONINI 1.05* 0.44* 0.21*   No results for input(s): TROPIPOC in the last 168 hours.   BNP Recent Labs  Lab 08/28/17 0829  BNP 1,330.0*     DDimer No results for input(s): DDIMER in the last 168 hours.    Radiology    No results found.  Cardiac Studies   TTE: 08/30/17  Left ventricle: The cavity size was normal. There was mild  concentric hypertrophy. Systolic function was normal. The  estimated ejection fraction was in the range of 55% to 60%. Wall  motion was normal; there were no regional wall motion  abnormalities. Features are consistent with a  pseudonormal left  ventricular filling pattern, with concomitant abnormal relaxation  and increased filling pressure (grade 2 diastolic dysfunction).  - Mitral valve: There was moderate regurgitation directed  eccentrically and posteriorly.  - Left atrium: The atrium was mildly dilated.  - Right ventricle: The cavity size was moderately dilated.  - Pericardium, extracardiac: A small pericardial effusion was  identified circumferential, but mostly posterior to the heart.  There was no evidence of hemodynamic compromise.  Impressions:  - No significant change from July 2018 study.   Patient Profile     66 y.o. female with a hx of HFpEF, DM2, HTN, prior strokewho is[facility], where she presented with heart failure exacerbation, UTI and encephalopathy. Cardiology was consulted for positive troponin and CHF.  Assessment & Plan    1. Acute on chronic diastolic CHF: Volume status is much better. She is negative 13 liters since admission. LV function is normal by echo 08/30/17. Change to po Lasix today.   2.  Elevated troponin: Likely demand ischemia in the setting of UTI. She has had no chest pain. Will discuss ischemic testing at outpatient follow up. She will follow up in our [facility] heartcare office.     OK to discharge from cardiac perspective. We will arrange cardiac follow up  Signed, Lauree Chandler, MD  09/02/2017, 7:01 AM

## 2017-09-02 NOTE — Progress Notes (Signed)
Pt oxygen saturation at 94-97 % on 2 Liter of O2. No c/o of Shortness of breath. Pt  Foley catheter discontinued, replaced with purewick.

## 2017-09-03 ENCOUNTER — Inpatient Hospital Stay (HOSPITAL_COMMUNITY): Payer: Medicare Other

## 2017-09-03 DIAGNOSIS — I509 Heart failure, unspecified: Secondary | ICD-10-CM

## 2017-09-03 DIAGNOSIS — I5033 Acute on chronic diastolic (congestive) heart failure: Secondary | ICD-10-CM

## 2017-09-03 DIAGNOSIS — I639 Cerebral infarction, unspecified: Secondary | ICD-10-CM

## 2017-09-03 DIAGNOSIS — I63523 Cerebral infarction due to unspecified occlusion or stenosis of bilateral anterior cerebral arteries: Secondary | ICD-10-CM

## 2017-09-03 LAB — GLUCOSE, CAPILLARY
GLUCOSE-CAPILLARY: 151 mg/dL — AB (ref 65–99)
GLUCOSE-CAPILLARY: 89 mg/dL (ref 65–99)
Glucose-Capillary: 100 mg/dL — ABNORMAL HIGH (ref 65–99)
Glucose-Capillary: 96 mg/dL (ref 65–99)

## 2017-09-03 LAB — SEDIMENTATION RATE: Sed Rate: 35 mm/hr — ABNORMAL HIGH (ref 0–22)

## 2017-09-03 LAB — CBC
HCT: 28.8 % — ABNORMAL LOW (ref 36.0–46.0)
Hemoglobin: 8.7 g/dL — ABNORMAL LOW (ref 12.0–15.0)
MCH: 25.8 pg — AB (ref 26.0–34.0)
MCHC: 30.2 g/dL (ref 30.0–36.0)
MCV: 85.5 fL (ref 78.0–100.0)
PLATELETS: 178 10*3/uL (ref 150–400)
RBC: 3.37 MIL/uL — AB (ref 3.87–5.11)
RDW: 17.4 % — ABNORMAL HIGH (ref 11.5–15.5)
WBC: 7.2 10*3/uL (ref 4.0–10.5)

## 2017-09-03 LAB — RPR: RPR Ser Ql: NONREACTIVE

## 2017-09-03 MED ORDER — ASPIRIN EC 325 MG PO TBEC
325.0000 mg | DELAYED_RELEASE_TABLET | Freq: Every day | ORAL | Status: DC
  Administered 2017-09-03 – 2017-09-04 (×2): 325 mg via ORAL
  Filled 2017-09-03 (×2): qty 1

## 2017-09-03 MED ORDER — CLOPIDOGREL BISULFATE 75 MG PO TABS
75.0000 mg | ORAL_TABLET | Freq: Every day | ORAL | Status: DC
  Administered 2017-09-03 – 2017-09-04 (×2): 75 mg via ORAL
  Filled 2017-09-03 (×2): qty 1

## 2017-09-03 MED ORDER — FUROSEMIDE 40 MG PO TABS
40.0000 mg | ORAL_TABLET | Freq: Every day | ORAL | Status: DC
  Administered 2017-09-04: 40 mg via ORAL
  Filled 2017-09-03: qty 1

## 2017-09-03 NOTE — Progress Notes (Signed)
PROGRESS NOTE    Madison Reed  RXV:[id number] DOB: 09-15-1951 DOA: 08/28/2017 PCP: Celene Squibb, MD    Brief Narrative: Madison Reed is a 66 y.o. femalewith medical history significant ofdiastolic heart failure, diabetes, chronic respiratory failure on home oxygen. Patient presented with CHF exacerbation and possible UTI and was encephalopathy. Currently being diuresed and empiric treatment with Vancomycin and Cefepime.     Assessment & Plan:   Active Problems:   DM type 2, uncontrolled, with lower extremity ulcer ([place])   AKI (acute kidney injury) ([place])   Acute on chronic diastolic CHF (congestive heart failure) (HCC)   Troponin level elevated   Essential hypertension   Acute cystitis without hematuria   UTI (urinary tract infection)   CKD (chronic kidney disease) stage 4, GFR 15-29 ml/min ([facility])   Acute on chronic respiratory failure with hypoxia (H[facility])   Encephalopathy   Anemia   NSTEMI (non-ST elevated myocardial infarction) (HCC)   Elevated troponin   Urinary tract infection Urine culture pending. E. Coli. -Continue ceftriaxone 1g daily.  Remove catheter  she has received 7 days of IV antibiotics.   Encephalopathy Presumed metabolic secondary to urinary tract infection. ABG and ammonia unremarkable.  -continue to monitor -PT/OT eval -per son patient has improved, but they notice drooling mouth. Will get MRI brain.  TSH normal, B 12 not low.  MRI positive for acute stroke. Neuro consulted. Will follow recommendation. Stated statins.   Diffusion throughout much of the corpus callosum. Although somewhat rare, infarct is favored given there are small bilateral cerebral white matter infarcts  -differential; stroke, vs edema, vs metabolic, vs vitamin deficiency.  -MRA ordered, follow carotid doppler.  Started statins.  On aspirin/  Vasculitis panel ordered.  Started on Thimaine. B 12 normal. TSH normal.   Acute on chronic diastolic CHF Hypervolemic on exam.  Pleural effusions and interstitial edema on chest x-ray. 4.8L UOP over the last 24 hours with weight now down 8.2 kg from yesterday (4.8 kg from two days ago).  -She is negative 8.3L since admission. -change to oral lasix  -remove foley catheter   Acute kidney injury on CKD stage IV Baseline of 1.7. Up to 2.98 on admission. Likely secondary to cardiorenal disease. Improving. -diuresis as above Cr trending down.   Acute on chronic respiratory failure Per notes, states patient on 2 L chronically, but patient tells me 4 L chronically. Currently on 4 L and stable.  Diabetes mellitus -Continue home Lantus at 20% decreased dose of 10 units. -SSI -Heart healthy/carb modified diet  Anemia, iron deficiency.  S/p 1 unit PRBC on 11/18 with now stable hemoglobin. No evidence of bleeding. Hd decreased. Will give IV iron.  Iron; 20, sat ratio 6,  Repeat hb in am.   Elevated troponin In setting of CHF exacerbation. -Cardiology recommendations: nuclear stress out patient.   Hypokalemia Resolved.       DVT prophylaxis: heparin  Code Status: full code.  Family Communication: son who was at bedside.  Disposition Plan: home vs SNF   Consultants:   Cardiology    Procedures:   none   Antimicrobials:   Ceftriaxone 11-19  Vancomycin (11/17>>11/18)  Cefepime (11/17>>11/18)  Levaquin (11/17)  Aztreonam (11/17)  Ceftriaxone (11/19>>     Subjective: More alert follows command.  Able to move lower extremities.   Objective: Vitals:   09/02/17 2133 09/03/17 0444 09/03/17 0500 09/03/17 0815  BP: (!) 177/64 (!) 151/64  (!) 181/72  Pulse:  90  88  Resp:  18    Temp:  99.1 F (37.3 C)    TempSrc:  Oral    SpO2:  96%  96%  Weight:   87.5 kg (192 lb 14.4 oz)   Height:        Intake/Output Summary (Last 24 hours) at 09/03/2017 1508 Last data filed at 09/03/2017 1430 Gross per 24 hour  Intake 361 ml  Output 801 ml  Net -440 ml   Filed Weights   09/01/17  0448 09/02/17 0403 09/03/17 0500  Weight: 91.7 kg (202 lb 3.2 oz) 92.1 kg (203 lb 1.6 oz) 87.5 kg (192 lb 14.4 oz)    Examination:  General exam; NAD Respiratory system: Normal respiratory effort, CTA Cardiovascular system: S 1, S 2 RRR Gastrointestinal system: BS present, soft, nt Central nervous system: Alert, moves all extremities.  Extremities: no edema Skin: No rashes, lesions or ulcers    Data Reviewed: I have personally reviewed following labs and imaging studies  CBC: Recent Labs  Lab 08/28/17 0829  08/30/17 0623 08/31/17 0400 09/01/17 0346 09/02/17 0305 09/03/17 0523  WBC 4.1   < > 4.3 5.4 5.7 6.1 7.2  NEUTROABS 2.9  --   --   --   --   --   --   HGB 7.8*   < > 8.4* 8.2* 8.4* 7.9* 8.7*  HCT 26.8*   < > 27.8* 26.6* 27.5* 26.7* 28.8*  MCV 85.4   < > 83.2 84.7 85.1 85.0 85.5  PLT 155   < > 150 159 163 158 178   < > = values in this interval not displayed.   Basic Metabolic Panel: Recent Labs  Lab 08/28/17 0829 08/30/17 0623 08/31/17 0400 09/01/17 0346 09/02/17 0305  NA 137 138 139 139 140  K 3.8 2.6* 3.2* 3.5 3.5  CL 103 104 102 99* 99*  CO2 26 28 28  32 34*  GLUCOSE 124* 99 90 90 101*  BUN 42* 27* 23* 19 19  CREATININE 2.98* 2.20* 1.85* 1.62* 1.63*  CALCIUM 8.3* 7.8* 8.0* 8.2* 8.1*   GFR: Estimated Creatinine Clearance: 38.9 mL/min (A) (by C-G formula based on SCr of 1.63 mg/dL (H)). Liver Function Tests: Recent Labs  Lab 08/28/17 0829  AST 41  ALT 14  ALKPHOS 35*  BILITOT 1.2  PROT 6.6  ALBUMIN 2.7*   No results for input(s): LIPASE, AMYLASE in the last 168 hours. Recent Labs  Lab 08/28/17 1639  AMMONIA 17   Coagulation Profile: Recent Labs  Lab 08/28/17 0829  INR 1.33   Cardiac Enzymes: Recent Labs  Lab 08/28/17 1639 08/29/17 0037 08/31/17 1112  TROPONINI 1.05* 0.44* 0.21*   BNP (last 3 results) No results for input(s): PROBNP in the last 8760 hours. HbA1C: No results for input(s): HGBA1C in the last 72  hours. CBG: Recent Labs  Lab 09/02/17 1129 09/02/17 1616 09/02/17 2109 09/03/17 0746 09/03/17 1109  GLUCAP 119* 113* 123* 100* 151*   Lipid Profile: No results for input(s): CHOL, HDL, LDLCALC, TRIG, CHOLHDL, LDLDIRECT in the last 72 hours. Thyroid Function Tests: Recent Labs    09/02/17 1400  TSH 2.349   Anemia Panel: No results for input(s): VITAMINB12, FOLATE, FERRITIN, TIBC, IRON, RETICCTPCT in the last 72 hours. Sepsis Labs: Recent Labs  Lab 08/28/17 0840 08/28/17 1137  LATICACIDVEN 0.63 0.45*    Recent Results (from the past 240 hour(s))  Urine culture     Status: Abnormal   Collection Time: 08/28/17  8:23 AM  Result Value Ref Range Status  Specimen Description URINE, CLEAN CATCH  Final   Special Requests NONE  Final   Culture >=100,000 COLONIES/mL ESCHERICHIA COLI (A)  Final   Report Status 09/01/2017 FINAL  Final   Organism ID, Bacteria ESCHERICHIA COLI (A)  Final      Susceptibility   Escherichia coli - MIC*    AMPICILLIN >=32 RESISTANT Resistant     CEFAZOLIN <=4 SENSITIVE Sensitive     CEFTRIAXONE <=1 SENSITIVE Sensitive     CIPROFLOXACIN >=4 RESISTANT Resistant     GENTAMICIN <=1 SENSITIVE Sensitive     IMIPENEM <=0.25 SENSITIVE Sensitive     NITROFURANTOIN <=16 SENSITIVE Sensitive     TRIMETH/SULFA <=20 SENSITIVE Sensitive     AMPICILLIN/SULBACTAM 16 INTERMEDIATE Intermediate     PIP/TAZO <=4 SENSITIVE Sensitive     Extended ESBL NEGATIVE Sensitive     * >=100,000 COLONIES/mL ESCHERICHIA COLI  Blood Culture (routine x 2)     Status: None   Collection Time: 08/28/17  8:28 AM  Result Value Ref Range Status   Specimen Description BLOOD RIGHT FOREARM  Final   Special Requests   Final    BOTTLES DRAWN AEROBIC AND ANAEROBIC Blood Culture results may not be optimal due to an inadequate volume of blood received in culture bottles   Culture NO GROWTH 5 DAYS  Final   Report Status 09/02/2017 FINAL  Final  Blood Culture (routine x 2)     Status: None    Collection Time: 08/28/17  8:49 AM  Result Value Ref Range Status   Specimen Description BLOOD RIGHT HAND  Final   Special Requests   Final    BOTTLES DRAWN AEROBIC AND ANAEROBIC Blood Culture results may not be optimal due to an inadequate volume of blood received in culture bottles   Culture NO GROWTH 5 DAYS  Final   Report Status 09/02/2017 FINAL  Final         Radiology Studies: Mr Virgel Paling GE Contrast  Result Date: 09/03/2017 CLINICAL DATA:  Corpus callosum and cerebral white matter infarcts on MRI. Altered level of consciousness. EXAM: MRA HEAD WITHOUT CONTRAST TECHNIQUE: Angiographic images of the Circle of Willis were obtained using MRA technique without intravenous contrast. COMPARISON:  Brain MRI 09/02/2017.  Head MRA 08/03/2013. FINDINGS: The visualized distal vertebral artery is widely patent and supplies the basilar. The visualized distal left vertebral artery appears small without flow related enhancement identified distal to the PICA origin, new from the prior MRA and suggesting distal occlusion (versus severely diminished flow). Patent bilateral PICA and SCA origins are identified. The basilar artery is patent with slight narrowing distally near the SCA and PCA origins. The PCAs are patent with similar appearance of a severe distal left P1 stenosis. A severe distal right P3 stenosis is also similar to the prior MRA. The internal carotid arteries are patent from skullbase to carotid termini with likely mild progression of bilateral cavernous and supraclinoid stenoses, moderate to severe on the right and mild-to-moderate on the left. There is also severe narrowing of the right ICA terminus. The ACAs are patent with similar appearance of moderate proximal A1 stenosis on the left. The MCAs are patent with mild M1 narrowing on the right. Apparent moderate to severe left M1 stenoses on the prior MRA are no longer evident and were likely artifactual. There is mild-to-moderate ACA and MCA  branch vessel irregularity. No intracranial aneurysm is identified. IMPRESSION: 1. 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 2. No large vessel occlusion in the anterior  circulation. 3. Progressive intracranial ICA atherosclerosis including up to severe stenosis on the right. 4. Similar appearance of severe bilateral PCA stenoses. Electronically Signed   By: Logan Bores M.D.   On: 09/03/2017 14:41   Mr Brain Wo Contrast  Result Date: 09/02/2017 CLINICAL DATA:  Altered level of consciousness. Patient found unresponsive 08/2016. EXAM: MRI HEAD WITHOUT CONTRAST TECHNIQUE: Multiplanar, multiecho pulse sequences of the brain and surrounding structures were obtained without intravenous contrast. COMPARISON:  08/03/2013 FINDINGS: Brain: There is restricted diffusion throughout much of the corpus callosum, extending from the rostrum to the body/ splenium, midline to right eccentric. There also bilateral cerebral white matter islands of restricted diffusion and infarcts are favored in this patient with advanced intracranial atherosclerosis by 2014 MRI. No detected peripheral sparing or confluent bilateral involvement typical for Marchifava-Bignami. Other demyelination disease is also considered unlikely. Microvascular ischemic type changes seen superimposed on the acute findings in the bilateral cerebral white matter. Remote right inferior cerebellar infarct. Remote right lateral lenticulostriate infarct. Vascular: Not evaluated Skull and upper cervical spine: Essentially not evaluated Sinuses/Orbits: Not evaluate Other: Only diffusion and the motion degraded T2 FLAIR sequence were obtained. A call is been placed to the ordering provider. IMPRESSION: 1. Restricted diffusion throughout much of the corpus callosum. Although somewhat rare, infarct is favored given there are small bilateral cerebral white matter infarcts and advanced intracranial atherosclerosis on s 2014 MRA. Marchifava-Bignami is not  favored but does cause restricted diffusion in the corpus callosum, please correlate for risk factors. 2. Remote right cerebellar and right lateral lenticulostriate infarcts. 3. Incomplete and motion degraded study. Only diffusion and T2 FLAIR imaging was acquired. Electronically Signed   By: Monte Fantasia M.D.   On: 09/02/2017 16:07        Scheduled Meds: . amLODipine  10 mg Oral Daily  . aspirin EC  325 mg Oral Daily  . carvedilol  6.25 mg Oral BID WC  . chlorhexidine  15 mL Mouth Rinse BID  . clopidogrel  75 mg Oral Daily  . furosemide  40 mg Oral Daily  . heparin subcutaneous  5,000 Units Subcutaneous Q8H  . hydrALAZINE  100 mg Oral Q8H  . insulin aspart  0-15 Units Subcutaneous TID WC  . insulin aspart  0-5 Units Subcutaneous QHS  . insulin glargine  10 Units Subcutaneous Daily  . mouth rinse  15 mL Mouth Rinse q12n4p  . pantoprazole  40 mg Oral Daily  . potassium chloride  20 mEq Oral BID  . simvastatin  20 mg Oral q1800  . sodium chloride flush  3 mL Intravenous Q12H   Continuous Infusions: . sodium chloride    . cefTRIAXone (ROCEPHIN)  IV Stopped (09/02/17 2209)  . thiamine injection Stopped (09/03/17 1128)     LOS: 6 days    Time spent: 35 minutes.     Elmarie Shiley, MD [facility] Pager [phone]  If 7PM-7AM, please contact night-coverage [email or web address] Password [facility] 09/03/2017, 3:08 PM

## 2017-09-03 NOTE — Progress Notes (Signed)
Patient off floor to MRI

## 2017-09-03 NOTE — Progress Notes (Signed)
Progress Note  Patient Name: Madison Reed Date of Encounter: 09/03/2017  Primary Cardiologist: Radford Pax  Subjective   No complaints. Denies chest pain or dyspnea  Inpatient Medications    Scheduled Meds: . amLODipine  10 mg Oral Daily  . aspirin EC  325 mg Oral Daily  . carvedilol  6.25 mg Oral BID WC  . chlorhexidine  15 mL Mouth Rinse BID  . clopidogrel  75 mg Oral Daily  . furosemide  80 mg Intravenous BID  . heparin subcutaneous  5,000 Units Subcutaneous Q8H  . hydrALAZINE  100 mg Oral Q8H  . insulin aspart  0-15 Units Subcutaneous TID WC  . insulin aspart  0-5 Units Subcutaneous QHS  . insulin glargine  10 Units Subcutaneous Daily  . mouth rinse  15 mL Mouth Rinse q12n4p  . pantoprazole  40 mg Oral Daily  . potassium chloride  20 mEq Oral BID  . simvastatin  20 mg Oral q1800  . sodium chloride flush  3 mL Intravenous Q12H   Continuous Infusions: . sodium chloride    . cefTRIAXone (ROCEPHIN)  IV Stopped (09/02/17 2209)  . thiamine injection Stopped (09/02/17 2224)   PRN Meds: sodium chloride, acetaminophen, ondansetron (ZOFRAN) IV, sodium chloride flush   Vital Signs    Vitals:   09/02/17 2106 09/02/17 2133 09/03/17 0444 09/03/17 0500  BP: (!) 169/77 (!) 177/64 (!) 151/64   Pulse: 80  90   Resp: 18  18   Temp: 98.5 F (36.9 C)  99.1 F (37.3 C)   TempSrc: Oral  Oral   SpO2: 97%  96%   Weight:    192 lb 14.4 oz (87.5 kg)  Height:        Intake/Output Summary (Last 24 hours) at 09/03/2017 0742 Last data filed at 09/03/2017 0500 Gross per 24 hour  Intake 121 ml  Output 1701 ml  Net -1580 ml   Filed Weights   09/01/17 0448 09/02/17 0403 09/03/17 0500  Weight: 202 lb 3.2 oz (91.7 kg) 203 lb 1.6 oz (92.1 kg) 192 lb 14.4 oz (87.5 kg)    Telemetry    Sinus - Personally Reviewed  Physical Exam   General: Well developed, well nourished, NAD  HEENT: OP clear, mucus membranes moist  SKIN: warm, dry. No rashes. Neuro: No focal deficits    Musculoskeletal: Muscle strength 5/5 all ext  Psychiatric: Mood and affect normal  Neck: No JVD, no carotid bruits, no thyromegaly, no lymphadenopathy.  Lungs:Clear bilaterally, no wheezes, rhonci, crackles Cardiovascular: Regular rate and rhythm. No murmurs, gallops or rubs. Abdomen:Soft. Bowel sounds present. Non-tender.  Extremities: No lower extremity edema.    Labs    Chemistry Recent Labs  Lab 08/28/17 0829  08/31/17 0400 09/01/17 0346 09/02/17 0305  NA 137   < > 139 139 140  K 3.8   < > 3.2* 3.5 3.5  CL 103   < > 102 99* 99*  CO2 26   < > 28 32 34*  GLUCOSE 124*   < > 90 90 101*  BUN 42*   < > 23* 19 19  CREATININE 2.98*   < > 1.85* 1.62* 1.63*  CALCIUM 8.3*   < > 8.0* 8.2* 8.1*  PROT 6.6  --   --   --   --   ALBUMIN 2.7*  --   --   --   --   AST 41  --   --   --   --   ALT  14  --   --   --   --   ALKPHOS 35*  --   --   --   --   BILITOT 1.2  --   --   --   --   GFRNONAA 16*   < > 28* 33* 32*  GFRAA 18*   < > 32* 38* 37*  ANIONGAP 8   < > 9 8 7    < > = values in this interval not displayed.     Hematology Recent Labs  Lab 08/31/17 0400 09/01/17 0346 09/02/17 0305  WBC 5.4 5.7 6.1  RBC 3.14* 3.23* 3.14*  HGB 8.2* 8.4* 7.9*  HCT 26.6* 27.5* 26.7*  MCV 84.7 85.1 85.0  MCH 26.1 26.0 25.2*  MCHC 30.8 30.5 29.6*  RDW 17.3* 17.3* 17.3*  PLT 159 163 158    Cardiac Enzymes Recent Labs  Lab 08/28/17 1639 08/29/17 0037 08/31/17 1112  TROPONINI 1.05* 0.44* 0.21*   No results for input(s): TROPIPOC in the last 168 hours.   BNP Recent Labs  Lab 08/28/17 0829  BNP 1,330.0*     DDimer No results for input(s): DDIMER in the last 168 hours.    Radiology    Mr Brain Wo Contrast  Result Date: 09/02/2017 CLINICAL DATA:  Altered level of consciousness. Patient found unresponsive 08/2016. EXAM: MRI HEAD WITHOUT CONTRAST TECHNIQUE: Multiplanar, multiecho pulse sequences of the brain and surrounding structures were obtained without intravenous contrast.  COMPARISON:  08/03/2013 FINDINGS: Brain: There is restricted diffusion throughout much of the corpus callosum, extending from the rostrum to the body/ splenium, midline to right eccentric. There also bilateral cerebral white matter islands of restricted diffusion and infarcts are favored in this patient with advanced intracranial atherosclerosis by 2014 MRI. No detected peripheral sparing or confluent bilateral involvement typical for Marchifava-Bignami. Other demyelination disease is also considered unlikely. Microvascular ischemic type changes seen superimposed on the acute findings in the bilateral cerebral white matter. Remote right inferior cerebellar infarct. Remote right lateral lenticulostriate infarct. Vascular: Not evaluated Skull and upper cervical spine: Essentially not evaluated Sinuses/Orbits: Not evaluate Other: Only diffusion and the motion degraded T2 FLAIR sequence were obtained. A call is been placed to the ordering provider. IMPRESSION: 1. Restricted diffusion throughout much of the corpus callosum. Although somewhat rare, infarct is favored given there are small bilateral cerebral white matter infarcts and advanced intracranial atherosclerosis on s 2014 MRA. Marchifava-Bignami is not favored but does cause restricted diffusion in the corpus callosum, please correlate for risk factors. 2. Remote right cerebellar and right lateral lenticulostriate infarcts. 3. Incomplete and motion degraded study. Only diffusion and T2 FLAIR imaging was acquired. Electronically Signed   By: Monte Fantasia M.D.   On: 09/02/2017 16:07    Cardiac Studies   TTE: 08/30/17  Left ventricle: The cavity size was normal. There was mild  concentric hypertrophy. Systolic function was normal. The  estimated ejection fraction was in the range of 55% to 60%. Wall  motion was normal; there were no regional wall motion  abnormalities. Features are consistent with a pseudonormal left  ventricular filling pattern, with  concomitant abnormal relaxation  and increased filling pressure (grade 2 diastolic dysfunction).  - Mitral valve: There was moderate regurgitation directed  eccentrically and posteriorly.  - Left atrium: The atrium was mildly dilated.  - Right ventricle: The cavity size was moderately dilated.  - Pericardium, extracardiac: A small pericardial effusion was  identified circumferential, but mostly posterior to the heart.  There was  no evidence of hemodynamic compromise.  Impressions:  - No significant change from July 2018 study.   Patient Profile     66 y.o. female with a hx of HFpEF, DM2, HTN, prior strokewho is[facility], where she presented with heart failure exacerbation, UTI and encephalopathy. Cardiology was consulted for positive troponin and CHF.   Assessment & Plan    1. Acute on chronic diastolic CHF: Volume status overall is greatly improved. She is negative 14.5 liters of fluid since admission. LV function is normal by echo this admission. She is now on po Lasix.    2.  Elevated troponin: This was felt to be due to demand ischemia in the setting of urosepsis. No chest pain. Outpatient stress testing is recommended. She will follow up in our [facility] heartcare office.     3. CVA: MRI suggestive of CVA. Neurology following. TEE would have to be next week if it is required as part of the workup.   Signed, Lauree Chandler, MD  09/03/2017, 7:42 AM

## 2017-09-03 NOTE — Progress Notes (Signed)
Carotid artery duplex has been completed. 1-39% bilateral ICA stenosis.   Transcranial doppler has been completed.  09/03/17 3:18 PM Madison Reed RVT

## 2017-09-03 NOTE — Progress Notes (Signed)
CSW following for disposition planning. CSW discussed plan with patient's daughter and spouse. Family working out if they will be able to pay possible co-pays, as patient used some Medicare days earlier this year. Family to follow up with insurance and determine if patient's supplement will cover co-pays. Family interested in [place] has offered bed. CSW to follow.  Estanislado Emms, [place]

## 2017-09-03 NOTE — Progress Notes (Signed)
PT Cancellation Note  Patient Details Name: Madison Reed MRN: [id number] DOB: 1951-02-04   Cancelled Treatment:    Reason Eval/Treat Not Completed: Patient at procedure or test/unavailable . Pt off the floor at MRI. PT to return as able.   [name] M [name] 09/03/2017, 2:10 PM   Kittie Plater, PT, DPT Pager #: [phone] Office #: [phone]

## 2017-09-03 NOTE — Progress Notes (Signed)
STROKE TEAM PROGRESS NOTE   SUBJECTIVE (INTERVAL HISTORY) No family is at the bedside.  Overall she feels her condition is rapidly improving.  She is lying in bed without complaints.  No acute neuro change overnight.  Neuro exam essentially normal.  Kidney function much improved, 2D echo normal EF.   OBJECTIVE Temp:  [98.5 F (36.9 C)-99.1 F (37.3 C)] 99.1 F (37.3 C) (11/23 0444) Pulse Rate:  [80-90] 88 (11/23 0815) Cardiac Rhythm: Normal sinus rhythm (11/23 1210) Resp:  [18] 18 (11/23 0444) BP: (145-181)/(64-117) 181/72 (11/23 0815) SpO2:  [96 %-97 %] 96 % (11/23 0815) Weight:  [192 lb 14.4 oz (87.5 kg)] 192 lb 14.4 oz (87.5 kg) (11/23 0500)  Recent Labs  Lab 09/02/17 1616 09/02/17 2109 09/03/17 0746 09/03/17 1109 09/03/17 1600  GLUCAP 113* 123* 100* 151* 89   Recent Labs  Lab 08/28/17 0829 08/30/17 0623 08/31/17 0400 09/01/17 0346 09/02/17 0305  NA 137 138 139 139 140  K 3.8 2.6* 3.2* 3.5 3.5  CL 103 104 102 99* 99*  CO2 26 28 28  32 34*  GLUCOSE 124* 99 90 90 101*  BUN 42* 27* 23* 19 19  CREATININE 2.98* 2.20* 1.85* 1.62* 1.63*  CALCIUM 8.3* 7.8* 8.0* 8.2* 8.1*   Recent Labs  Lab 08/28/17 0829  AST 41  ALT 14  ALKPHOS 35*  BILITOT 1.2  PROT 6.6  ALBUMIN 2.7*   Recent Labs  Lab 08/28/17 0829  08/30/17 0623 08/31/17 0400 09/01/17 0346 09/02/17 0305 09/03/17 0523  WBC 4.1   < > 4.3 5.4 5.7 6.1 7.2  NEUTROABS 2.9  --   --   --   --   --   --   HGB 7.8*   < > 8.4* 8.2* 8.4* 7.9* 8.7*  HCT 26.8*   < > 27.8* 26.6* 27.5* 26.7* 28.8*  MCV 85.4   < > 83.2 84.7 85.1 85.0 85.5  PLT 155   < > 150 159 163 158 178   < > = values in this interval not displayed.   Recent Labs  Lab 08/28/17 1639 08/29/17 0037 08/31/17 1112  TROPONINI 1.05* 0.44* 0.21*   No results for input(s): LABPROT, INR in the last 72 hours. No results for input(s): COLORURINE, LABSPEC, [place], GLUCOSEU, HGBUR, BILIRUBINUR, KETONESUR, PROTEINUR, UROBILINOGEN, NITRITE, LEUKOCYTESUR  in the last 72 hours.  Invalid input(s): APPERANCEUR     Component Value Date/Time   CHOL 119 08/29/2017 0220   TRIG 192 (H) 08/29/2017 0220   HDL 11 (L) 08/29/2017 0220   CHOLHDL 10.8 08/29/2017 0220   VLDL 38 08/29/2017 0220   LDLCALC 70 08/29/2017 0220   Lab Results  Component Value Date   HGBA1C 4.9 08/29/2017      Component Value Date/Time   LABOPIA NONE DETECTED 08/03/2013 0936   COCAINSCRNUR NONE DETECTED 08/03/2013 0936   LABBENZ NONE DETECTED 08/03/2013 0936   AMPHETMU NONE DETECTED 08/03/2013 0936   THCU NONE DETECTED 08/03/2013 0936   LABBARB NONE DETECTED 08/03/2013 0936    No results for input(s): ETH in the last 168 hours.  I have personally reviewed the radiological images below and agree with the radiology interpretations.  Ct Head Wo Contrast 08/28/2017 IMPRESSION: Old right basal ganglia/internal capsule lacunar infarct and right cerebellar lacunar infarct. No acute intracranial abnormality. Atrophy, chronic small vessel disease.   CT chest 1. Large bilateral scratched at cardiomegaly with prominent bilateral pleural effusions and diffuse patchy ground-glass attenuation suggesting edema and congestive heart failure. 2. More prominent airspace  consolidation at the bases bilaterally likely reflects atelectasis. Infection is not excluded. 3. Splenomegaly of uncertain etiology. 4.  Aortic Atherosclerosis (ICD10-I70.0).  Mr Jodene Nam Head Wo Contrast 09/03/2017 IMPRESSION: 1. 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 2. No large vessel occlusion in the anterior circulation. 3. Progressive intracranial ICA atherosclerosis including up to severe stenosis on the right. 4. Similar appearance of severe bilateral PCA stenoses.   Mr Brain Wo Contrast 09/02/2017 IMPRESSION: 1. Restricted diffusion throughout much of the corpus callosum. Although somewhat rare, infarct is favored given there are small bilateral cerebral white matter infarcts and advanced  intracranial atherosclerosis on s 2014 MRA. Marchifava-Bignami is not favored but does cause restricted diffusion in the corpus callosum, please correlate for risk factors. 2. Remote right cerebellar and right lateral lenticulostriate infarcts. 3. Incomplete and motion degraded study. Only diffusion and T2 FLAIR imaging was acquired.    Dg Chest Port 1 View 08/30/2017 IMPRESSION: Improved pulmonary edema. No change in bilateral pleural effusions and basilar airspace disease, likely atelectasis. Cardiomegaly. Atherosclerosis.   Dg Chest Port 1 View 08/28/2017 IMPRESSION: 1. Congestive heart failure. 2. Bilateral lower lobe airspace disease likely reflects atelectasis. Infection or aspiration is also considered.  Carotid Doppler  1-39% bilateral ICA stenosis.   TCD - Elevated velocity at right proximal MCA, terminal ICA and proximal ACA indicating likely moderate stenosis at right terminal ICA, right M1 and right A1. Given multiple stroke risk factors, the stenosis likely to be athersclerotic in nature. clinical  correlation recommended.  TTE  - Left ventricle: The cavity size was normal. There was mild   concentric hypertrophy. Systolic function was normal. The   estimated ejection fraction was in the range of 55% to 60%. Wall   motion was normal; there were no regional wall motion   abnormalities. Features are consistent with a pseudonormal left   ventricular filling pattern, with concomitant abnormal relaxation   and increased filling pressure (grade 2 diastolic dysfunction). - Mitral valve: There was moderate regurgitation directed   eccentrically and posteriorly. - Left atrium: The atrium was mildly dilated. - Right ventricle: The cavity size was moderately dilated. - Pericardium, extracardiac: A small pericardial effusion was   identified circumferential, but mostly posterior to the heart.   There was no evidence of hemodynamic compromise. Impressions: - No significant change from  July 2018 study.  PHYSICAL EXAM  Temp:  [98.5 F (36.9 C)-99.1 F (37.3 C)] 99.1 F (37.3 C) (11/23 0444) Pulse Rate:  [80-90] 88 (11/23 0815) Resp:  [18] 18 (11/23 0444) BP: (145-181)/(64-117) 181/72 (11/23 0815) SpO2:  [96 %-97 %] 96 % (11/23 0815) Weight:  [192 lb 14.4 oz (87.5 kg)] 192 lb 14.4 oz (87.5 kg) (11/23 0500)  General - Well nourished, well developed, in no apparent distress.  Ophthalmologic - fundi not visualized due to noncooperation.  Cardiovascular - Regular rate and rhythm with no murmur.  Mental Status -  Level of arousal and orientation to time, place, and person were intact, however, not orientated to age Language including expression, naming, repetition, comprehension was assessed and found intact.  Cranial Nerves II - XII - II - Visual field intact OU. III, IV, VI - Extraocular movements intact. V - Facial sensation intact bilaterally. VII - Facial movement intact bilaterally. VIII - Hearing & vestibular intact bilaterally. X - Palate elevates symmetrically. XI - Chin turning & shoulder shrug intact bilaterally. XII - Tongue protrusion intact.  Motor Strength - The patient's strength was normal in all extremities and pronator  drift was absent.  Bulk was normal and fasciculations were absent.   Motor Tone - Muscle tone was assessed at the neck and appendages and was normal.  Reflexes - The patient's reflexes were symmetrical in all extremities and she had no pathological reflexes.  Sensory - Light touch, temperature/pinprick were assessed and were symmetrical.    Coordination - The patient had normal movements in the hands and feet with no ataxia or dysmetria.  Tremor was absent.  Gait and Station - deferred.   ASSESSMENT/PLAN Ms. Madison Reed is a 66 y.o. female with history of diabetes, hypertension, CHF, HLD, obesity, psoriasis admitted for lack of responsiveness. No tPA given due to out of window.    Stroke:  bilateral corpus callosum  infarcts, R>L, SOS bilateral punctate ACA infarcts likely secondary to large vessel disease source given multifocal intracranial stenosis.  Cardioembolic source cannot be completely ruled out.  Resultant back to neuro baseline  MRI bilateral corpus callosum infarcts, right > left, and bilateral punctate ACA infarcts  MRA multifocal intracranial stenosis including right ICA siphon, bilateral PCA, distal right VA  Carotid Doppler unremarkable  2D Echo EF 55-60%  recommend 30-day cardiac event monitoring as outpatient to rule out A. fib  LDL 70  HgbA1c 4.9  Heparin subq for VTE prophylaxis  Diet heart healthy/carb modified Room service appropriate? Yes; Fluid consistency: Thin   aspirin 325 mg daily prior to admission, now on aspirin 325 mg daily and clopidogrel 75 mg daily.  Continue dual antiplatelet for 3 months and then Plavix alone.  Patient counseled to be compliant with her antithrombotic medications  Ongoing aggressive stroke risk factor management  Therapy recommendations:  pending  Disposition:  Pending  History of stroke  07/2013 - left pontine infarct with right-sided weakness and slurred speech - MRA showed bilateral siphon, left M1, left A1, bilateral PCA stenosis - carotid Doppler showed left ICA 50-69% stenosis - discharged on aspirin and statin  Intracranial stenosis  07/2013 MRA showed bilateral ICA siphon, left M1, A1, lateral PC stenosis  This admission MRI showed right ICA siphon, bilateral PCA, distal right VA stenosis  TCD showed right terminal ICA, right M1, right A1 elevated velocity indicating stenosis  CHF with non-STEMI  Cardiology on board  Elevated troponin, now off heparin on dual antiplatelet  Had aggressive diuresis  EF 55-60%  On Lasix, Coreg, hydralazine  UTI  UA WBC too numerous to count  On Rocephin  AKI  Creatinine 1.63 improved from 2.98  Off IV fluid  Anemia of chronic disease  Hemoglobin improved from  7.9-8.3  Treatment per primary team  Diabetes  HgbA1c 4.9 goal < 7.0  Controlled  Currently on Lantus  CBG monitoring  SSI  DM education and close PCP follow up  Hypertension Stable on the high side Permissive hypertension (OK if <180/105) for 24-48 hours post stroke and then gradually normalized within 5-7 days.  Long term BP goal normotensive  Hyperlipidemia  Home meds: Fenofibrate  LDL 70, goal < 70  Now on Zocor 20  Continue statin at discharge  Other Stroke Risk Factors  Advanced age  Former smoker, has quit smoking  Obesity, Body mass index is 31.14 kg/m.   CHF  Other Active Problems  Elevated troponin - off heparin  Hospital day # 6  Neurology will sign off. Please call with questions. Pt will follow up with Cecille Rubin, NP, at [facility] in about 6 weeks. Thanks for the consult.  Rosalin Hawking, MD PhD Stroke Neurology  09/03/2017 4:59 PM    To contact Stroke Continuity provider, please refer to [email or web address] After hours, contact General Neurology

## 2017-09-03 NOTE — Evaluation (Signed)
Clinical/Bedside Swallow Evaluation Patient Details  Name: Madison Reed MRN: [id number] Date of Birth: April 11, 1951  Today's Date: 09/03/2017 Time: SLP Start Time (ACUTE ONLY): 3086 SLP Stop Time (ACUTE ONLY): 0848 SLP Time Calculation (min) (ACUTE ONLY): 9 min  Past Medical History:  Past Medical History:  Diagnosis Date  . Abdominal aneurysm ([place])   . Bone spur   . Chronic back pain   . CVA (cerebral infarction)   . Diabetes mellitus without complication ([place])   . Diastolic heart failure ([place])   . Hypertension   . Kidney stones   . Neuropathy   . Psoriasis   . Stroke ([place])   . Wound, open, foot    Past Surgical History:  Past Surgical History:  Procedure Laterality Date  . ABDOMINAL HYSTERECTOMY    . BLADDER SUSPENSION    . VESICOVAGINAL FISTULA CLOSURE W/ TAH     HPI:  Madison Reed is a 66 y.o. female with medical history significant of diastolic heart failure, diabetes, CVA, abdominal aneurysm, chronic respiratory failure on home oxygen, who was brought to the emergency room today by her husband when she was found unresponsive. Found to have acute on chronic CHF, elevated tropnin. MRI restricted diffusion throughout much of the corpus callosum.Although somewhat rare, infarct is favored given there are small bilateral cerebral white matter infarcts and advanced intracranial atherosclerosis on s 2014 MRA, remote right cerebellar and right lateral lenticulostriate infarcts. CXR 11/19 No change in bilateral pleural effusions and basilar airspace disease, likely atelectasis. BSE 2014 recommended Dys 3, thin, no mixed consistencies.    Assessment / Plan / Recommendation Clinical Impression  Minimal decreased coordination and manipulation with cracker and min labial residue. No pharyngeal dysphagia suspected from bedside swallow. Lungs clear per CXR 4 days ago; risk factor for dysphagia increased with acute CVA. Recommend continue regular texture and thin liquids, sit upright, small  bites/sips. No f/u needed.  SLP Visit Diagnosis: Dysphagia, oral phase (R13.11)    Aspiration Risk  Mild aspiration risk    Diet Recommendation Regular;Thin liquid   Liquid Administration via: Straw;Cup Medication Administration: Whole meds with liquid Supervision: Patient able to self feed Compensations: Slow rate;Small sips/bites Postural Changes: Seated upright at 90 degrees    Other  Recommendations Oral Care Recommendations: Oral care BID   Follow up Recommendations None      Frequency and Duration            Prognosis        Swallow Study   General HPI: Madison Reed is a 66 y.o. female with medical history significant of diastolic heart failure, diabetes, CVA, abdominal aneurysm, chronic respiratory failure on home oxygen, who was brought to the emergency room today by her husband when she was found unresponsive. Found to have acute on chronic CHF, elevated tropnin. MRI restricted diffusion throughout much of the corpus callosum.Although somewhat rare, infarct is favored given there are small bilateral cerebral white matter infarcts and advanced intracranial atherosclerosis on s 2014 MRA, remote right cerebellar and right lateral lenticulostriate infarcts. CXR 11/19 No change in bilateral pleural effusions and basilar airspace disease, likely atelectasis. BSE 2014 recommended Dys 3, thin, no mixed consistencies.  Type of Study: Bedside Swallow Evaluation Previous Swallow Assessment: (see HPI) Diet Prior to this Study: Regular;Thin liquids Temperature Spikes Noted: No Respiratory Status: Nasal cannula History of Recent Intubation: No Behavior/Cognition: Alert;Requires cueing Oral Cavity Assessment: Within Functional Limits Oral Care Completed by SLP: No Oral Cavity - Dentition: Adequate natural dentition Vision: Functional for  self-feeding Self-Feeding Abilities: Able to feed self Patient Positioning: Upright in bed Baseline Vocal Quality: Normal Volitional Cough:  Strong Volitional Swallow: Able to elicit    Oral/Motor/Sensory Function Overall Oral Motor/Sensory Function: Mild impairment Facial ROM: Reduced left;Suspected CN VII (facial) dysfunction Facial Symmetry: Abnormal symmetry left;Suspected CN VII (facial) dysfunction Facial Strength: Reduced left;Suspected CN VII (facial) dysfunction Facial Sensation: Within Functional Limits Lingual ROM: Within Functional Limits Lingual Symmetry: Within Functional Limits Lingual Strength: Within Functional Limits Velum: Within Functional Limits Mandible: Within Functional Limits   Ice Chips Ice chips: Not tested   Thin Liquid Thin Liquid: Within functional limits Presentation: Straw    Nectar Thick Nectar Thick Liquid: Not tested   Honey Thick Honey Thick Liquid: Not tested   Puree Puree: Within functional limits   Solid   GO   Solid: Impaired Oral Phase Impairments: Reduced lingual movement/coordination Oral Phase Functional Implications: (labial residue min)        Houston Siren 09/03/2017,8:59 AM   Orbie Pyo Colvin Caroli.Ed [employer] [phone]

## 2017-09-04 ENCOUNTER — Other Ambulatory Visit: Payer: Self-pay | Admitting: Physician Assistant

## 2017-09-04 DIAGNOSIS — D508 Other iron deficiency anemias: Secondary | ICD-10-CM | POA: Diagnosis not present

## 2017-09-04 DIAGNOSIS — N3000 Acute cystitis without hematuria: Secondary | ICD-10-CM

## 2017-09-04 DIAGNOSIS — I5033 Acute on chronic diastolic (congestive) heart failure: Secondary | ICD-10-CM | POA: Diagnosis not present

## 2017-09-04 DIAGNOSIS — I10 Essential (primary) hypertension: Secondary | ICD-10-CM | POA: Diagnosis not present

## 2017-09-04 DIAGNOSIS — I214 Non-ST elevation (NSTEMI) myocardial infarction: Secondary | ICD-10-CM | POA: Diagnosis not present

## 2017-09-04 DIAGNOSIS — Z79899 Other long term (current) drug therapy: Secondary | ICD-10-CM | POA: Diagnosis not present

## 2017-09-04 DIAGNOSIS — J9621 Acute and chronic respiratory failure with hypoxia: Secondary | ICD-10-CM | POA: Diagnosis not present

## 2017-09-04 DIAGNOSIS — N189 Chronic kidney disease, unspecified: Secondary | ICD-10-CM | POA: Diagnosis not present

## 2017-09-04 DIAGNOSIS — I639 Cerebral infarction, unspecified: Secondary | ICD-10-CM

## 2017-09-04 DIAGNOSIS — N390 Urinary tract infection, site not specified: Secondary | ICD-10-CM | POA: Diagnosis not present

## 2017-09-04 DIAGNOSIS — M6281 Muscle weakness (generalized): Secondary | ICD-10-CM | POA: Diagnosis not present

## 2017-09-04 DIAGNOSIS — G9340 Encephalopathy, unspecified: Secondary | ICD-10-CM | POA: Diagnosis not present

## 2017-09-04 DIAGNOSIS — I63523 Cerebral infarction due to unspecified occlusion or stenosis of bilateral anterior cerebral arteries: Secondary | ICD-10-CM | POA: Diagnosis not present

## 2017-09-04 DIAGNOSIS — D631 Anemia in chronic kidney disease: Secondary | ICD-10-CM | POA: Diagnosis not present

## 2017-09-04 DIAGNOSIS — S37009A Unspecified injury of unspecified kidney, initial encounter: Secondary | ICD-10-CM | POA: Diagnosis not present

## 2017-09-04 DIAGNOSIS — N9989 Other postprocedural complications and disorders of genitourinary system: Secondary | ICD-10-CM | POA: Diagnosis not present

## 2017-09-04 DIAGNOSIS — D649 Anemia, unspecified: Secondary | ICD-10-CM | POA: Diagnosis not present

## 2017-09-04 LAB — CBC
HCT: 29.4 % — ABNORMAL LOW (ref 36.0–46.0)
Hemoglobin: 8.9 g/dL — ABNORMAL LOW (ref 12.0–15.0)
MCH: 25.5 pg — ABNORMAL LOW (ref 26.0–34.0)
MCHC: 30.3 g/dL (ref 30.0–36.0)
MCV: 84.2 fL (ref 78.0–100.0)
PLATELETS: 176 10*3/uL (ref 150–400)
RBC: 3.49 MIL/uL — AB (ref 3.87–5.11)
RDW: 17.3 % — AB (ref 11.5–15.5)
WBC: 6.3 10*3/uL (ref 4.0–10.5)

## 2017-09-04 LAB — GLUCOSE, CAPILLARY
GLUCOSE-CAPILLARY: 79 mg/dL (ref 65–99)
Glucose-Capillary: 79 mg/dL (ref 65–99)

## 2017-09-04 MED ORDER — FUROSEMIDE 40 MG PO TABS: 40.0000 mg | ORAL_TABLET | Freq: Every day | ORAL | 0 refills | Status: DC

## 2017-09-04 MED ORDER — VITAMIN B-1 100 MG PO TABS: 100.0000 mg | ORAL_TABLET | Freq: Every day | ORAL | 0 refills | Status: AC

## 2017-09-04 MED ORDER — CLOPIDOGREL BISULFATE 75 MG PO TABS: 75.0000 mg | ORAL_TABLET | Freq: Every day | ORAL | 0 refills | Status: AC

## 2017-09-04 MED ORDER — INSULIN GLARGINE 100 UNIT/ML ~~LOC~~ SOLN: 10.0000 [IU] | Freq: Every day | SUBCUTANEOUS | 11 refills | Status: AC

## 2017-09-04 MED ORDER — SIMVASTATIN 20 MG PO TABS
20.0000 mg | ORAL_TABLET | Freq: Every day | ORAL | 0 refills | Status: AC
Start: 2017-09-04 — End: ?

## 2017-09-04 NOTE — Progress Notes (Signed)
Progress Note  Patient Name: Madison Reed Date of Encounter: 09/04/2017  Primary Cardiologist: Radford Pax  Subjective   No chest pain or dyspnea   Inpatient Medications    Scheduled Meds: . amLODipine  10 mg Oral Daily  . aspirin EC  325 mg Oral Daily  . carvedilol  6.25 mg Oral BID WC  . chlorhexidine  15 mL Mouth Rinse BID  . clopidogrel  75 mg Oral Daily  . furosemide  40 mg Oral Daily  . heparin subcutaneous  5,000 Units Subcutaneous Q8H  . hydrALAZINE  100 mg Oral Q8H  . insulin aspart  0-15 Units Subcutaneous TID WC  . insulin aspart  0-5 Units Subcutaneous QHS  . insulin glargine  10 Units Subcutaneous Daily  . mouth rinse  15 mL Mouth Rinse q12n4p  . pantoprazole  40 mg Oral Daily  . potassium chloride  20 mEq Oral BID  . simvastatin  20 mg Oral q1800  . sodium chloride flush  3 mL Intravenous Q12H   Continuous Infusions: . sodium chloride    . thiamine injection 0 mg (09/03/17 1806)   PRN Meds: sodium chloride, acetaminophen, ondansetron (ZOFRAN) IV, sodium chloride flush   Vital Signs    Vitals:   09/03/17 1600 09/03/17 2000 09/04/17 0500 09/04/17 0857  BP: (!) 168/63 (!) 162/61 (!) 166/62 (!) 168/74  Pulse: 74 70 74   Resp: 19 18 18    Temp:  98.3 F (36.8 C) 98.2 F (36.8 C)   TempSrc:  Oral Oral   SpO2: 98% 99% 97%   Weight:   194 lb 6.4 oz (88.2 kg)   Height:        Intake/Output Summary (Last 24 hours) at 09/04/2017 0943 Last data filed at 09/04/2017 0358 Gross per 24 hour  Intake 360 ml  Output 400 ml  Net -40 ml   Filed Weights   09/02/17 0403 09/03/17 0500 09/04/17 0500  Weight: 203 lb 1.6 oz (92.1 kg) 192 lb 14.4 oz (87.5 kg) 194 lb 6.4 oz (88.2 kg)    Telemetry    Sinus - Personally Reviewed  Physical Exam   Affect appropriate Healthy:  appears stated age HEENT: normal Neck supple with no adenopathy JVP normal no bruits no thyromegaly Lungs clear with no wheezing and good diaphragmatic motion Heart:  S1/S2 no murmur, no  rub, gallop or click PMI normal Abdomen: benighn, BS positve, no tenderness, no AAA no bruit.  No HSM or HJR Distal pulses intact with no bruits No edema Neuro non-focal Skin warm and dry No muscular weakness    Labs    Chemistry Recent Labs  Lab 08/31/17 0400 09/01/17 0346 09/02/17 0305  NA 139 139 140  K 3.2* 3.5 3.5  CL 102 99* 99*  CO2 28 32 34*  GLUCOSE 90 90 101*  BUN 23* 19 19  CREATININE 1.85* 1.62* 1.63*  CALCIUM 8.0* 8.2* 8.1*  GFRNONAA 28* 33* 32*  GFRAA 32* 38* 37*  ANIONGAP 9 8 7      Hematology Recent Labs  Lab 09/02/17 0305 09/03/17 0523 09/04/17 0417  WBC 6.1 7.2 6.3  RBC 3.14* 3.37* 3.49*  HGB 7.9* 8.7* 8.9*  HCT 26.7* 28.8* 29.4*  MCV 85.0 85.5 84.2  MCH 25.2* 25.8* 25.5*  MCHC 29.6* 30.2 30.3  RDW 17.3* 17.4* 17.3*  PLT 158 178 176    Cardiac Enzymes Recent Labs  Lab 08/28/17 1639 08/29/17 0037 08/31/17 1112  TROPONINI 1.05* 0.44* 0.21*   No results for input(s):  TROPIPOC in the last 168 hours.   BNP No results for input(s): BNP, PROBNP in the last 168 hours.   DDimer No results for input(s): DDIMER in the last 168 hours.    Radiology    Mr Jodene Nam Head Wo Contrast  Result Date: 09/03/2017 CLINICAL DATA:  Corpus callosum and cerebral white matter infarcts on MRI. Altered level of consciousness. EXAM: MRA HEAD WITHOUT CONTRAST TECHNIQUE: Angiographic images of the Circle of Willis were obtained using MRA technique without intravenous contrast. COMPARISON:  Brain MRI 09/02/2017.  Head MRA 08/03/2013. FINDINGS: The visualized distal vertebral artery is widely patent and supplies the basilar. The visualized distal left vertebral artery appears small without flow related enhancement identified distal to the PICA origin, new from the prior MRA and suggesting distal occlusion (versus severely diminished flow). Patent bilateral PICA and SCA origins are identified. The basilar artery is patent with slight narrowing distally near the SCA and  PCA origins. The PCAs are patent with similar appearance of a severe distal left P1 stenosis. A severe distal right P3 stenosis is also similar to the prior MRA. The internal carotid arteries are patent from skullbase to carotid termini with likely mild progression of bilateral cavernous and supraclinoid stenoses, moderate to severe on the right and mild-to-moderate on the left. There is also severe narrowing of the right ICA terminus. The ACAs are patent with similar appearance of moderate proximal A1 stenosis on the left. The MCAs are patent with mild M1 narrowing on the right. Apparent moderate to severe left M1 stenoses on the prior MRA are no longer evident and were likely artifactual. There is mild-to-moderate ACA and MCA branch vessel irregularity. No intracranial aneurysm is identified. IMPRESSION: 1. 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 2. No large vessel occlusion in the anterior circulation. 3. Progressive intracranial ICA atherosclerosis including up to severe stenosis on the right. 4. Similar appearance of severe bilateral PCA stenoses. Electronically Signed   By: Logan Bores M.D.   On: 09/03/2017 14:41   Mr Brain Wo Contrast  Result Date: 09/02/2017 CLINICAL DATA:  Altered level of consciousness. Patient found unresponsive 08/2016. EXAM: MRI HEAD WITHOUT CONTRAST TECHNIQUE: Multiplanar, multiecho pulse sequences of the brain and surrounding structures were obtained without intravenous contrast. COMPARISON:  08/03/2013 FINDINGS: Brain: There is restricted diffusion throughout much of the corpus callosum, extending from the rostrum to the body/ splenium, midline to right eccentric. There also bilateral cerebral white matter islands of restricted diffusion and infarcts are favored in this patient with advanced intracranial atherosclerosis by 2014 MRI. No detected peripheral sparing or confluent bilateral involvement typical for Marchifava-Bignami. Other demyelination disease is  also considered unlikely. Microvascular ischemic type changes seen superimposed on the acute findings in the bilateral cerebral white matter. Remote right inferior cerebellar infarct. Remote right lateral lenticulostriate infarct. Vascular: Not evaluated Skull and upper cervical spine: Essentially not evaluated Sinuses/Orbits: Not evaluate Other: Only diffusion and the motion degraded T2 FLAIR sequence were obtained. A call is been placed to the ordering provider. IMPRESSION: 1. Restricted diffusion throughout much of the corpus callosum. Although somewhat rare, infarct is favored given there are small bilateral cerebral white matter infarcts and advanced intracranial atherosclerosis on s 2014 MRA. Marchifava-Bignami is not favored but does cause restricted diffusion in the corpus callosum, please correlate for risk factors. 2. Remote right cerebellar and right lateral lenticulostriate infarcts. 3. Incomplete and motion degraded study. Only diffusion and T2 FLAIR imaging was acquired. Electronically Signed   By: Monte Fantasia  M.D.   On: 09/02/2017 16:07    Cardiac Studies   TTE: 08/30/17  Left ventricle: The cavity size was normal. There was mild  concentric hypertrophy. Systolic function was normal. The  estimated ejection fraction was in the range of 55% to 60%. Wall  motion was normal; there were no regional wall motion  abnormalities. Features are consistent with a pseudonormal left  ventricular filling pattern, with concomitant abnormal relaxation  and increased filling pressure (grade 2 diastolic dysfunction).  - Mitral valve: There was moderate regurgitation directed  eccentrically and posteriorly.  - Left atrium: The atrium was mildly dilated.  - Right ventricle: The cavity size was moderately dilated.  - Pericardium, extracardiac: A small pericardial effusion was  identified circumferential, but mostly posterior to the heart.  There was no evidence of hemodynamic compromise.    Impressions:  - No significant change from July 2018 study.   Patient Profile     66 y.o. female with a hx of HFpEF, DM2, HTN, prior strokewho [facility], where she presented with heart failure exacerbation, UTI and encephalopathy. Cardiology was consulted for positive troponin and CHF.   Assessment & Plan    1. Acute on chronic diastolic CHF: Volume status overall is greatly improved.  LV function is normal by echo this admission. She is now on po Lasix.    2.  Elevated troponin: This was felt to be due to demand ischemia in the setting of urosepsis. No chest pain. Outpatient stress testing is recommended. She will follow up in our [facility] heartcare office.     3. CVA: MRI suggestive of CVA. Neurology following.She has significant intracranial vascular disease   Will sign off   Signed, Jenkins Rouge, MD  09/04/2017, 9:43 AM

## 2017-09-04 NOTE — Clinical Social Work Placement (Signed)
   CLINICAL SOCIAL WORK PLACEMENT  NOTE  Date:  09/04/2017  Patient Details  Name: Madison Reed MRN: [id number] Date of Birth: 1950-11-04  Clinical Social Work is seeking post-discharge placement for this patient at the [place] level of care (*CSW will initial, date and re-position this form in  chart as items are completed):  Yes   Patient/family provided with [facility] Work Department's list of facilities offering this level of care within the geographic area requested by the patient (or if unable, by the patient's family).  Yes   Patient/family informed of their freedom to choose among providers that offer the needed level of care, that participate in Medicare, Medicaid or managed care program needed by the patient, have an available bed and are willing to accept the patient.  Yes   Patient/family informed of [facility]'s ownership interest in [facility] and [facility], as well as of the fact that they are under no obligation to receive care at these facilities.  PASRR submitted to EDS on 09/04/17     PASRR number received on 09/04/17     Existing PASRR number confirmed on       FL2 transmitted to all facilities in geographic area requested by pt/family on 09/04/17     FL2 transmitted to all facilities within larger geographic area on       Patient informed that his/her managed care company has contracts with or will negotiate with certain facilities, including the following:        Yes   Patient/family informed of bed offers received.  Patient chooses bed at [place] [facility]     Physician recommends and patient chooses bed at      Patient to be transferred to [facility] at [place] on 09/04/17.  Patient to be transferred to facility by [facility]     Patient family notified on 09/04/17 of transfer.  Name of family member notified:  Evon Slack, daughter     PHYSICIAN       Additional Comment:     _______________________________________________ Carolin Sicks, [place] 09/04/2017, 12:37 PM

## 2017-09-04 NOTE — Progress Notes (Signed)
Patient will Discharge [facility] in [place] Anticipated DC Date: 09/04/17 Family Notified: Evon Slack, daughter Transport By: Corey Harold for 2:15pm   Per MD patient ready for DC to [facility] in [place] . RN, patient, patient's family, and facility notified of DC. Assessment, Fl2/Pasrr, and Discharge Summary sent to facility. RN given number for report [phone] ). DC packet on chart. Ambulance transport requested for patient.   CSW signing off.  Reed Breech LCSWA [phone]

## 2017-09-04 NOTE — Discharge Summary (Addendum)
Physician Discharge Summary  Madison Reed PXT:[id number] DOB: 1950-12-08 DOA: 08/28/2017  PCP: Celene Squibb, MD  Admit date: 08/28/2017 Discharge date: 09/04/2017  Admitted From: Home  Disposition:   SNF  Recommendations for Outpatient Follow-up:  1. Follow up with PCP in 1-2 weeks 2. Please obtain BMP/CBC in one week 3. Needs events monitor for 30 days.  4. Follow up with cardiology for consideration of Stress test.  5. Needs to be on dual antiplatelet therapy for three months and then plavix alone.  6. Follow thiamine levels, also ANA, and ANCA.  7. Recheck thiamine level and continue thiamine supplementation. Increase thiamine dose if indicated by repeat thiamine level (addendum added by Kerney Elbe, MD on 09/17/17).    Discharge Condition: Stable.  CODE STATUS: full code.  Diet recommendation: Heart Healthy  Brief/Interim Summary:  Brief Narrative: Madison Reed a 66 y.o.femalewith medical history significant ofdiastolic heart failure, diabetes, chronic respiratory failure on home oxygen. Patient presented with CHF exacerbation and possible UTI and was encephalopathy. Currently being diuresed and empiric treatment with Vancomycin and Cefepime.  Addendum: Thiamine level came back low (63.9) after discharge.    Assessment & Plan:   Active Problems:   DM type 2, uncontrolled, with lower extremity ulcer ([place])   AKI (acute kidney injury) ([place])   Acute on chronic diastolic CHF (congestive heart failure) (HCC)   Troponin level elevated   Essential hypertension   Acute cystitis without hematuria   UTI (urinary tract infection)   CKD (chronic kidney disease) stage 4, GFR 15-29 ml/min (HCC)   Acute on chronic respiratory failure with hypoxia (HCC)   Encephalopathy   Anemia   NSTEMI (non-ST elevated myocardial infarction) (HCC)   Elevated troponin   Urinary tract infection Urine culture pending.E. Coli. -Continueceftriaxone 1g daily.  Remove catheter  she  has received 7 days of IV antibiotics.   Encephalopathy Presumed metabolic secondary to urinary tract infection. ABG and ammonia unremarkable.  -continue to monitor -PT/OT eval -per son patient has improved, but they notice drooling mouth. Will get MRI brain.  TSH normal, B 12 not low.  MRI positive for acute stroke. Neuro consulted. Stated statins.   Stroke Bilateral corpus Callosum infarct. Diffusion throughout much of the corpus callosum. 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 No large vessel occlusion in the anterior circulation. Progressive intracranial ICA atherosclerosis including up to severe stenosis on the right. Similar appearance of severe bilateral PCA stenoses. Started statins.  On aspirin/ and plavix. Needs 3 months dual antiplatelet therapy , then plavix alone.  Vasculitis panel ordered. Pending at this time.  Started on Thimaine. Thiamine level pending. B 12 normal. TSH normal.  need 30 days event monitor.   Acute on chronic diastolic CHF Hypervolemic on exam. Pleural effusions and interstitial edema on chest x-ray. 4.8L UOP over the last 24 hours with weightnow down 8.2 kg from yesterday (4.8 kg from two days ago). -She is negative 8.3L since admission. -change to oral lasix  -remove foley catheter   Acute kidney injury on CKD stage IV Baseline of 1.7. Up to 2.98 on admission. Likely secondary to cardiorenal disease. Improving. -diuresis as above Cr trending down.   Acute on chronic respiratory failure Per notes, states patient on 2 L chronically, but patient tells me 4 L chronically. Currently on 4 L and stable.  Diabetes mellitus -Continue home Lantus at 20%decreaseddose of  10 units. -SSI -Heart healthy/carb modified diet  Anemia, iron deficiency.  S/p 1 unit PRBC on 11/18 with now stable hemoglobin. No evidence of  bleeding. Hd decreased. Will give IV iron.  Iron; 20, sat ratio 6,  Repeat hb in am.   Elevated troponin In setting of CHF exacerbation. -Cardiology recommendations: nuclear stress out patient.   Hypokalemia Resolved.   Addendum to A/P (addendum entered by Kerney Elbe, MD.  Neurology Service): Thiamine level came back low at 63.9 after discharge. Will need repeat thiamine level in one month.      Discharge Diagnoses:  Active Problems:   Cerebrovascular accident (CVA) due to bilateral stenosis of anterior cerebral arteries ([place])   DM type 2, uncontrolled, with lower extremity ulcer ([place])   AKI (acute kidney injury) ([place])   Acute on chronic diastolic CHF (congestive heart failure) (HCC)   Troponin level elevated   Essential hypertension   Acute cystitis without hematuria   UTI (urinary tract infection)   CKD (chronic kidney disease) stage 4, GFR 15-29 ml/min (HCC)   Acute on chronic respiratory failure with hypoxia (HCC)   Encephalopathy   Anemia   NSTEMI (non-ST elevated myocardial infarction) (HCC)   Elevated troponin   CHF exacerbation ([place]) Thiamine deficiency   Discharge Instructions  Discharge Instructions    Ambulatory referral to Neurology   Complete by:  As directed    Follow up with stroke clinic Cecille Rubin preferred, if not available, then consider Caesar Chestnut, [facility] or Jaynee Eagles whoever is available) at [facility] in about 6-8 weeks. Thanks.   Diet - low sodium heart healthy   Complete by:  As directed    Increase activity slowly   Complete by:  As directed      Allergies as of 09/04/2017      Reactions   Glipizide    Severe hypoglycemia (22!)   Penicillins Itching   Tolerated Cefepime Has patient had a PCN reaction causing immediate rash, facial/tongue/throat swelling, SOB or lightheadedness with hypotension: Yes Has patient had a PCN reaction causing severe rash involving mucus membranes or skin necrosis: No Has patient had a PCN reaction that  required hospitalization: No Has patient had a PCN reaction occurring within the last 10 years: No If all of the above answers are "NO", then may proceed with Cephalosporin use.   Clonidine Derivatives Other (See Comments)   Numbness  to tongue face, and arm   Codeine Itching   Hydrocodone-acetaminophen Itching   Plasticized Base [plastibase]    Shellfish Allergy Itching   Tape    Sulfa Antibiotics Itching, Rash      Medication List    STOP taking these medications   doxazosin 4 MG tablet Commonly known as:  CARDURA   ondansetron 4 MG tablet Commonly known as:  ZOFRAN   sitaGLIPtin 25 MG tablet Commonly known as:  JANUVIA     TAKE these medications   amLODipine 10 MG tablet Commonly known as:  NORVASC Take 10 mg by mouth daily.   aspirin 325 MG tablet Take 1 tablet (325 mg total) by mouth daily.   carvedilol 6.25 MG tablet Commonly known as:  COREG Take 1 tablet (6.25 mg total) by mouth 2 (two) times daily with a meal.   clopidogrel 75 MG tablet Commonly known as:  PLAVIX Take 1 tablet (75 mg total) by mouth daily. Start taking on:  09/05/2017   cyanocobalamin 1000 MCG tablet Take 1 tablet (1,000 mcg total) by mouth daily.   fenofibrate 160 MG  tablet Take 160 mg by mouth daily.   fexofenadine 180 MG tablet Commonly known as:  ALLEGRA Take 180 mg by mouth daily.   folic acid 1 MG tablet Commonly known as:  FOLVITE Take 1 tablet (1 mg total) by mouth daily.   furosemide 40 MG tablet Commonly known as:  LASIX Take 1 tablet (40 mg total) by mouth daily. Start taking on:  09/05/2017 What changed:  when to take this   gabapentin 100 MG capsule Commonly known as:  NEURONTIN Take 200 mg by mouth at bedtime.   hydrALAZINE 100 MG tablet Commonly known as:  APRESOLINE Take 1 tablet (100 mg total) by mouth every 8 (eight) hours.   insulin glargine 100 UNIT/ML injection Commonly known as:  LANTUS Inject 0.1 mLs (10 Units total) into the skin daily. What  changed:  how much to take   omeprazole 20 MG capsule Commonly known as:  PRILOSEC Take 20 mg by mouth daily.   potassium chloride SA 20 MEQ tablet Commonly known as:  K-DUR,KLOR-CON Take 2 tablets (40 mEq total) by mouth daily.   simvastatin 20 MG tablet Commonly known as:  ZOCOR Take 1 tablet (20 mg total) by mouth daily at 6 PM.   thiamine 100 MG tablet Commonly known as:  VITAMIN B-1 Take 1 tablet (100 mg total) by mouth daily.       Contact information for follow-up providers    Satira Sark, MD Follow up in 1 week(s).   Specialty:  Cardiology Why:  cardiology office will arrange outpatient visit in [place] office in 2 weeks, will also contact you to arrange outpatient 30 day event monitor.  Contact information: [place] [address] [phone]        Rosalin Hawking, MD Follow up in 4 week(s).   Specialty:  Neurology Contact information: [address] [place] [address] [phone]            Contact information for after-discharge care    Destination    [facility] .   Service:  Skilled Nursing Contact information: [place] [phone]                 Allergies  Allergen Reactions  . Glipizide     Severe hypoglycemia (22!)  . Penicillins Itching    Tolerated Cefepime Has patient had a PCN reaction causing immediate rash, facial/tongue/throat swelling, SOB or lightheadedness with hypotension: Yes Has patient had a PCN reaction causing severe rash involving mucus membranes or skin necrosis: No Has patient had a PCN reaction that required hospitalization: No Has patient had a PCN reaction occurring within the last 10 years: No If all of the above answers are "NO", then may proceed with Cephalosporin use.   . Clonidine Derivatives Other (See Comments)    Numbness  to tongue face, and arm  . Codeine Itching  . Hydrocodone-Acetaminophen  Itching  . Plasticized Base [Plastibase]   . Shellfish Allergy Itching  . Tape   . Sulfa Antibiotics Itching and Rash    Consultations:  Cardiology  Neurology    Procedures/Studies: Ct Head Wo Contrast  Result Date: 08/28/2017 CLINICAL DATA:  Unresponsive, fever. EXAM: CT HEAD WITHOUT CONTRAST TECHNIQUE: Contiguous axial images were obtained from the base of the skull through the vertex without intravenous contrast. COMPARISON:  06/10/2017 FINDINGS: Brain: There is atrophy and chronic small vessel disease changes. Old right basal ganglia/internal capsule lacunar infarct. Old right cerebellar  infarct. No acute intracranial abnormality. Specifically, no hemorrhage, hydrocephalus, mass lesion, acute infarction, or significant intracranial injury. Vascular: No hyperdense vessel or unexpected calcification. Skull: No acute calvarial abnormality. Sinuses/Orbits: Visualized paranasal sinuses and mastoids clear. Orbital soft tissues unremarkable. Other: None IMPRESSION: Old right basal ganglia/internal capsule lacunar infarct and right cerebellar lacunar infarct. No acute intracranial abnormality. Atrophy, chronic small vessel disease. Electronically Signed   By: Rolm Baptise M.D.   On: 08/28/2017 10:43   Ct Chest Wo Contrast  Addendum Date: 08/28/2017   ADDENDUM REPORT: 08/28/2017 14:55 ADDENDUM: Consider further evaluation with thyroid ultrasound. If patient is clinically hyperthyroid, consider nuclear medicine thyroid uptake and scan. Electronically Signed   By: San Morelle M.D.   On: 08/28/2017 14:55   Result Date: 08/28/2017 CLINICAL DATA:  Acute respiratory illness, greater than 19 years old. EXAM: CT CHEST WITHOUT CONTRAST TECHNIQUE: Multidetector CT imaging of the chest was performed following the standard protocol without IV contrast. COMPARISON:  None. FINDINGS: Cardiovascular: The heart is mildly enlarged. Diffuse coronary artery calcifications are present. A small pericardial  effusion is present. Atherosclerotic calcifications are present in the aorta and branch vessels. There is mild prominence pulmonary artery is bilaterally. Mediastinum/Nodes: 10 mm right paratracheal lymph node is evident just above the carina. Other subcentimeter mediastinal lymph nodes are present. No definite hilar adenopathy is present. No significant axillary adenopathy is present. A 1.9 cm hypodense nodule is present inferiorly in the left lobe of the thyroid. Lungs/Pleura: Bilateral pleural effusions are present. Diffuse patchy ground-glass attenuation is present in the lungs bilaterally. Consolidated bilateral lower lobe airspace disease is noted bilaterally. Upper Abdomen: The spleen is enlarged. No focal lesions are evident. Musculoskeletal: Vertebral body heights and alignment are maintained. No acute or healing fractures are present. No focal lytic or blastic lesions are present. IMPRESSION: 1. Large bilateral scratched at cardiomegaly with prominent bilateral pleural effusions and diffuse patchy ground-glass attenuation suggesting edema and congestive heart failure. 2. More prominent airspace consolidation at the bases bilaterally likely reflects atelectasis. Infection is not excluded. 3. Splenomegaly of uncertain etiology. 4.  Aortic Atherosclerosis (ICD10-I70.0). Electronically Signed: By: San Morelle M.D. On: 08/28/2017 10:48   Mr Jodene Nam Head Wo Contrast  Result Date: 09/03/2017 CLINICAL DATA:  Corpus callosum and cerebral white matter infarcts on MRI. Altered level of consciousness. EXAM: MRA HEAD WITHOUT CONTRAST TECHNIQUE: Angiographic images of the Circle of Willis were obtained using MRA technique without intravenous contrast. COMPARISON:  Brain MRI 09/02/2017.  Head MRA 08/03/2013. FINDINGS: The visualized distal vertebral artery is widely patent and supplies the basilar. The visualized distal left vertebral artery appears small without flow related enhancement identified distal to  the PICA origin, new from the prior MRA and suggesting distal occlusion (versus severely diminished flow). Patent bilateral PICA and SCA origins are identified. The basilar artery is patent with slight narrowing distally near the SCA and PCA origins. The PCAs are patent with similar appearance of a severe distal left P1 stenosis. A severe distal right P3 stenosis is also similar to the prior MRA. The internal carotid arteries are patent from skullbase to carotid termini with likely mild progression of bilateral cavernous and supraclinoid stenoses, moderate to severe on the right and mild-to-moderate on the left. There is also severe narrowing of the right ICA terminus. The ACAs are patent with similar appearance of moderate proximal A1 stenosis on the left. The MCAs are patent with mild M1 narrowing on the right. Apparent moderate to severe left M1 stenoses on the prior  MRA are no longer evident and were likely artifactual. There is mild-to-moderate ACA and MCA branch vessel irregularity. No intracranial aneurysm is identified. IMPRESSION: 1. 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 2. No large vessel occlusion in the anterior circulation. 3. Progressive intracranial ICA atherosclerosis including up to severe stenosis on the right. 4. Similar appearance of severe bilateral PCA stenoses. Electronically Signed   By: Logan Bores M.D.   On: 09/03/2017 14:41   Mr Brain Wo Contrast  Result Date: 09/02/2017 CLINICAL DATA:  Altered level of consciousness. Patient found unresponsive 08/2016. EXAM: MRI HEAD WITHOUT CONTRAST TECHNIQUE: Multiplanar, multiecho pulse sequences of the brain and surrounding structures were obtained without intravenous contrast. COMPARISON:  08/03/2013 FINDINGS: Brain: There is restricted diffusion throughout much of the corpus callosum, extending from the rostrum to the body/ splenium, midline to right eccentric. There also bilateral cerebral white matter islands of restricted  diffusion and infarcts are favored in this patient with advanced intracranial atherosclerosis by 2014 MRI. No detected peripheral sparing or confluent bilateral involvement typical for Marchifava-Bignami. Other demyelination disease is also considered unlikely. Microvascular ischemic type changes seen superimposed on the acute findings in the bilateral cerebral white matter. Remote right inferior cerebellar infarct. Remote right lateral lenticulostriate infarct. Vascular: Not evaluated Skull and upper cervical spine: Essentially not evaluated Sinuses/Orbits: Not evaluate Other: Only diffusion and the motion degraded T2 FLAIR sequence were obtained. A call is been placed to the ordering provider. IMPRESSION: 1. Restricted diffusion throughout much of the corpus callosum. Although somewhat rare, infarct is favored given there are small bilateral cerebral white matter infarcts and advanced intracranial atherosclerosis on s 2014 MRA. Marchifava-Bignami is not favored but does cause restricted diffusion in the corpus callosum, please correlate for risk factors. 2. Remote right cerebellar and right lateral lenticulostriate infarcts. 3. Incomplete and motion degraded study. Only diffusion and T2 FLAIR imaging was acquired. Electronically Signed   By: Monte Fantasia M.D.   On: 09/02/2017 16:07   Dg Chest Port 1 View  Result Date: 08/30/2017 CLINICAL DATA:  CHF exacerbation.  Shortness of breath. EXAM: PORTABLE CHEST 1 VIEW COMPARISON:  CT chest and single view of the chest 08/28/2017. FINDINGS: Moderate to moderately large bilateral pleural effusions and associated airspace disease persist without change. There is cardiomegaly. Pulmonary edema seen on the prior plain film appears improved. No pneumothorax. Atherosclerosis is noted. IMPRESSION: Improved pulmonary edema. No change in bilateral pleural effusions and basilar airspace disease, likely atelectasis. Cardiomegaly. Atherosclerosis. Electronically Signed   By:  Inge Rise M.D.   On: 08/30/2017 09:26   Dg Chest Port 1 View  Result Date: 08/28/2017 CLINICAL DATA:  Fever.  Altered mental status. EXAM: PORTABLE CHEST 1 VIEW COMPARISON:  Two-view chest x-ray 06/10/2017 FINDINGS: The heart is enlarged. Aortic atherosclerosis is present. Interstitial edema and bilateral effusions are present. Bibasilar airspace disease is present. IMPRESSION: 1. Congestive heart failure. 2. Bilateral lower lobe airspace disease likely reflects atelectasis. Infection or aspiration is also considered. Electronically Signed   By: San Morelle M.D.   On: 08/28/2017 08:47      Subjective: She is alert, oriented to person. Denies pain   Discharge Exam: Vitals:   09/04/17 0500 09/04/17 0857  BP: (!) 166/62 (!) 168/74  Pulse: 74   Resp: 18   Temp: 98.2 F (36.8 C)   SpO2: 97%    Vitals:   09/03/17 1600 09/03/17 2000 09/04/17 0500 09/04/17 0857  BP: (!) 168/63 (!) 162/61 (!) 166/62 (!) 168/74  Pulse:  74 70 74   Resp: 19 18 18    Temp:  98.3 F (36.8 C) 98.2 F (36.8 C)   TempSrc:  Oral Oral   SpO2: 98% 99% 97%   Weight:   88.2 kg (194 lb 6.4 oz)   Height:        General: Pt is alert, awake, not in acute distress Cardiovascular: RRR, S1/S2 +, no rubs, no gallops Respiratory: CTA bilaterally, no wheezing, no rhonchi Abdominal: Soft, NT, ND, bowel sounds + Extremities: no edema, no cyanosis    The results of significant diagnostics from this hospitalization (including imaging, microbiology, ancillary and laboratory) are listed below for reference.     Microbiology: Recent Results (from the past 240 hour(s))  Urine culture     Status: Abnormal   Collection Time: 08/28/17  8:23 AM  Result Value Ref Range Status   Specimen Description URINE, CLEAN CATCH  Final   Special Requests NONE  Final   Culture >=100,000 COLONIES/mL ESCHERICHIA COLI (A)  Final   Report Status 09/01/2017 FINAL  Final   Organism ID, Bacteria ESCHERICHIA COLI (A)  Final       Susceptibility   Escherichia coli - MIC*    AMPICILLIN >=32 RESISTANT Resistant     CEFAZOLIN <=4 SENSITIVE Sensitive     CEFTRIAXONE <=1 SENSITIVE Sensitive     CIPROFLOXACIN >=4 RESISTANT Resistant     GENTAMICIN <=1 SENSITIVE Sensitive     IMIPENEM <=0.25 SENSITIVE Sensitive     NITROFURANTOIN <=16 SENSITIVE Sensitive     TRIMETH/SULFA <=20 SENSITIVE Sensitive     AMPICILLIN/SULBACTAM 16 INTERMEDIATE Intermediate     PIP/TAZO <=4 SENSITIVE Sensitive     Extended ESBL NEGATIVE Sensitive     * >=100,000 COLONIES/mL ESCHERICHIA COLI  Blood Culture (routine x 2)     Status: None   Collection Time: 08/28/17  8:28 AM  Result Value Ref Range Status   Specimen Description BLOOD RIGHT FOREARM  Final   Special Requests   Final    BOTTLES DRAWN AEROBIC AND ANAEROBIC Blood Culture results may not be optimal due to an inadequate volume of blood received in culture bottles   Culture NO GROWTH 5 DAYS  Final   Report Status 09/02/2017 FINAL  Final  Blood Culture (routine x 2)     Status: None   Collection Time: 08/28/17  8:49 AM  Result Value Ref Range Status   Specimen Description BLOOD RIGHT HAND  Final   Special Requests   Final    BOTTLES DRAWN AEROBIC AND ANAEROBIC Blood Culture results may not be optimal due to an inadequate volume of blood received in culture bottles   Culture NO GROWTH 5 DAYS  Final   Report Status 09/02/2017 FINAL  Final     Labs: BNP (last 3 results) Recent Labs    06/03/17 0707 06/10/17 0930 08/28/17 0829  BNP 327.0* 788.0* 1,610.9*   Basic Metabolic Panel: Recent Labs  Lab 08/30/17 0623 08/31/17 0400 09/01/17 0346 09/02/17 0305  NA 138 139 139 140  K 2.6* 3.2* 3.5 3.5  CL 104 102 99* 99*  CO2 28 28 32 34*  GLUCOSE 99 90 90 101*  BUN 27* 23* 19 19  CREATININE 2.20* 1.85* 1.62* 1.63*  CALCIUM 7.8* 8.0* 8.2* 8.1*   Liver Function Tests: No results for input(s): AST, ALT, ALKPHOS, BILITOT, PROT, ALBUMIN in the last 168 hours. No results for  input(s): LIPASE, AMYLASE in the last 168 hours. Recent Labs  Lab 08/28/17 1639  AMMONIA  17   CBC: Recent Labs  Lab 08/31/17 0400 09/01/17 0346 09/02/17 0305 09/03/17 0523 09/04/17 0417  WBC 5.4 5.7 6.1 7.2 6.3  HGB 8.2* 8.4* 7.9* 8.7* 8.9*  HCT 26.6* 27.5* 26.7* 28.8* 29.4*  MCV 84.7 85.1 85.0 85.5 84.2  PLT 159 163 158 178 176   Cardiac Enzymes: Recent Labs  Lab 08/28/17 1639 08/29/17 0037 08/31/17 1112  TROPONINI 1.05* 0.44* 0.21*   BNP: Invalid input(s): POCBNP CBG: Recent Labs  Lab 09/03/17 0746 09/03/17 1109 09/03/17 1600 09/03/17 2107 09/04/17 0721  GLUCAP 100* 151* 89 96 79   D-Dimer No results for input(s): DDIMER in the last 72 hours. Hgb A1c No results for input(s): HGBA1C in the last 72 hours. Lipid Profile No results for input(s): CHOL, HDL, LDLCALC, TRIG, CHOLHDL, LDLDIRECT in the last 72 hours. Thyroid function studies Recent Labs    09/02/17 1400  TSH 2.349   Anemia work up No results for input(s): VITAMINB12, FOLATE, FERRITIN, TIBC, IRON, RETICCTPCT in the last 72 hours. Urinalysis    Component Value Date/Time   COLORURINE YELLOW 08/28/2017 0822   APPEARANCEUR CLOUDY (A) 08/28/2017 0822   LABSPEC 1.009 08/28/2017 0822   PHURINE 6.0 08/28/2017 0822   GLUCOSEU NEGATIVE 08/28/2017 0822   HGBUR SMALL (A) 08/28/2017 0822   BILIRUBINUR NEGATIVE 08/28/2017 0822   KETONESUR NEGATIVE 08/28/2017 0822   PROTEINUR 100 (A) 08/28/2017 0822   UROBILINOGEN 0.2 08/03/2013 0936   NITRITE NEGATIVE 08/28/2017 0822   LEUKOCYTESUR LARGE (A) 08/28/2017 0822   Sepsis Labs Invalid input(s): PROCALCITONIN,  WBC,  LACTICIDVEN Microbiology Recent Results (from the past 240 hour(s))  Urine culture     Status: Abnormal   Collection Time: 08/28/17  8:23 AM  Result Value Ref Range Status   Specimen Description URINE, CLEAN CATCH  Final   Special Requests NONE  Final   Culture >=100,000 COLONIES/mL ESCHERICHIA COLI (A)  Final   Report Status 09/01/2017  FINAL  Final   Organism ID, Bacteria ESCHERICHIA COLI (A)  Final      Susceptibility   Escherichia coli - MIC*    AMPICILLIN >=32 RESISTANT Resistant     CEFAZOLIN <=4 SENSITIVE Sensitive     CEFTRIAXONE <=1 SENSITIVE Sensitive     CIPROFLOXACIN >=4 RESISTANT Resistant     GENTAMICIN <=1 SENSITIVE Sensitive     IMIPENEM <=0.25 SENSITIVE Sensitive     NITROFURANTOIN <=16 SENSITIVE Sensitive     TRIMETH/SULFA <=20 SENSITIVE Sensitive     AMPICILLIN/SULBACTAM 16 INTERMEDIATE Intermediate     PIP/TAZO <=4 SENSITIVE Sensitive     Extended ESBL NEGATIVE Sensitive     * >=100,000 COLONIES/mL ESCHERICHIA COLI  Blood Culture (routine x 2)     Status: None   Collection Time: 08/28/17  8:28 AM  Result Value Ref Range Status   Specimen Description BLOOD RIGHT FOREARM  Final   Special Requests   Final    BOTTLES DRAWN AEROBIC AND ANAEROBIC Blood Culture results may not be optimal due to an inadequate volume of blood received in culture bottles   Culture NO GROWTH 5 DAYS  Final   Report Status 09/02/2017 FINAL  Final  Blood Culture (routine x 2)     Status: None   Collection Time: 08/28/17  8:49 AM  Result Value Ref Range Status   Specimen Description BLOOD RIGHT HAND  Final   Special Requests   Final    BOTTLES DRAWN AEROBIC AND ANAEROBIC Blood Culture results may not be optimal due to an inadequate volume  of blood received in culture bottles   Culture NO GROWTH 5 DAYS  Final   Report Status 09/02/2017 FINAL  Final     Time coordinating discharge: Over 30 minutes  SIGNED:   Elmarie Shiley, MD  [facility] 09/04/2017, 12:03 PM Pager [phone]  If 7PM-7AM, please contact night-coverage [email or web address] Password [email or web address]

## 2017-09-04 NOTE — Progress Notes (Signed)
[facility] [place] and had indicated that the facility did not receive a D/C summary.Discharge summary sent/faxed to [facility] in [place] per request by facility. Had spoken with Ramona in relation to above.

## 2017-09-06 LAB — ANTINUCLEAR ANTIBODIES, IFA: ANTINUCLEAR ANTIBODIES, IFA: NEGATIVE

## 2017-09-07 LAB — VITAMIN B1: VITAMIN B1 (THIAMINE): 63.9 nmol/L — AB (ref 66.5–200.0)

## 2017-09-08 LAB — ANCA TITERS
C-ANCA: <1:20 {titer}
P-ANCA: <1:20 {titer}

## 2017-09-17 ENCOUNTER — Encounter (HOSPITAL_COMMUNITY): Payer: Self-pay | Admitting: Neurology

## 2017-09-17 NOTE — Plan of Care (Signed)
Called her nursing home with new information regarding thiamine level, which came back low. The amended discharge summary with new recommendations to continue thiamine supplementation and recheck thiamine level in one month has been received by the SNF and will be placed in her chart. Staff at SNF also stated they will transmit this information to the attending rounding on Madison Reed.   Electronically signed: Dr. Kerney Elbe

## 2017-10-01 DIAGNOSIS — L989 Disorder of the skin and subcutaneous tissue, unspecified: Secondary | ICD-10-CM | POA: Diagnosis not present

## 2017-10-01 DIAGNOSIS — I130 Hypertensive heart and chronic kidney disease with heart failure and stage 1 through stage 4 chronic kidney disease, or unspecified chronic kidney disease: Secondary | ICD-10-CM | POA: Diagnosis not present

## 2017-10-01 DIAGNOSIS — I5033 Acute on chronic diastolic (congestive) heart failure: Secondary | ICD-10-CM | POA: Diagnosis not present

## 2017-10-01 DIAGNOSIS — J9611 Chronic respiratory failure with hypoxia: Secondary | ICD-10-CM | POA: Diagnosis not present

## 2017-10-01 DIAGNOSIS — N183 Chronic kidney disease, stage 3 (moderate): Secondary | ICD-10-CM | POA: Diagnosis not present

## 2017-10-01 DIAGNOSIS — E1122 Type 2 diabetes mellitus with diabetic chronic kidney disease: Secondary | ICD-10-CM | POA: Diagnosis not present

## 2017-10-06 DIAGNOSIS — N183 Chronic kidney disease, stage 3 (moderate): Secondary | ICD-10-CM | POA: Diagnosis not present

## 2017-10-06 DIAGNOSIS — I130 Hypertensive heart and chronic kidney disease with heart failure and stage 1 through stage 4 chronic kidney disease, or unspecified chronic kidney disease: Secondary | ICD-10-CM | POA: Diagnosis not present

## 2017-10-06 DIAGNOSIS — L989 Disorder of the skin and subcutaneous tissue, unspecified: Secondary | ICD-10-CM | POA: Diagnosis not present

## 2017-10-06 DIAGNOSIS — E1122 Type 2 diabetes mellitus with diabetic chronic kidney disease: Secondary | ICD-10-CM | POA: Diagnosis not present

## 2017-10-06 DIAGNOSIS — I5033 Acute on chronic diastolic (congestive) heart failure: Secondary | ICD-10-CM | POA: Diagnosis not present

## 2017-10-06 DIAGNOSIS — J9611 Chronic respiratory failure with hypoxia: Secondary | ICD-10-CM | POA: Diagnosis not present

## 2017-10-07 ENCOUNTER — Encounter: Payer: Self-pay | Admitting: Cardiology

## 2017-10-08 DIAGNOSIS — L989 Disorder of the skin and subcutaneous tissue, unspecified: Secondary | ICD-10-CM | POA: Diagnosis not present

## 2017-10-08 DIAGNOSIS — J9611 Chronic respiratory failure with hypoxia: Secondary | ICD-10-CM | POA: Diagnosis not present

## 2017-10-08 DIAGNOSIS — I130 Hypertensive heart and chronic kidney disease with heart failure and stage 1 through stage 4 chronic kidney disease, or unspecified chronic kidney disease: Secondary | ICD-10-CM | POA: Diagnosis not present

## 2017-10-08 DIAGNOSIS — N183 Chronic kidney disease, stage 3 (moderate): Secondary | ICD-10-CM | POA: Diagnosis not present

## 2017-10-08 DIAGNOSIS — E1122 Type 2 diabetes mellitus with diabetic chronic kidney disease: Secondary | ICD-10-CM | POA: Diagnosis not present

## 2017-10-08 DIAGNOSIS — I5033 Acute on chronic diastolic (congestive) heart failure: Secondary | ICD-10-CM | POA: Diagnosis not present

## 2017-10-12 DIAGNOSIS — E1122 Type 2 diabetes mellitus with diabetic chronic kidney disease: Secondary | ICD-10-CM | POA: Diagnosis not present

## 2017-10-12 DIAGNOSIS — N183 Chronic kidney disease, stage 3 (moderate): Secondary | ICD-10-CM | POA: Diagnosis not present

## 2017-10-12 DIAGNOSIS — I130 Hypertensive heart and chronic kidney disease with heart failure and stage 1 through stage 4 chronic kidney disease, or unspecified chronic kidney disease: Secondary | ICD-10-CM | POA: Diagnosis not present

## 2017-10-12 DIAGNOSIS — L989 Disorder of the skin and subcutaneous tissue, unspecified: Secondary | ICD-10-CM | POA: Diagnosis not present

## 2017-10-12 DIAGNOSIS — I5033 Acute on chronic diastolic (congestive) heart failure: Secondary | ICD-10-CM | POA: Diagnosis not present

## 2017-10-12 DIAGNOSIS — J9611 Chronic respiratory failure with hypoxia: Secondary | ICD-10-CM | POA: Diagnosis not present

## 2017-10-14 DIAGNOSIS — L989 Disorder of the skin and subcutaneous tissue, unspecified: Secondary | ICD-10-CM | POA: Diagnosis not present

## 2017-10-14 DIAGNOSIS — I5033 Acute on chronic diastolic (congestive) heart failure: Secondary | ICD-10-CM | POA: Diagnosis not present

## 2017-10-14 DIAGNOSIS — I130 Hypertensive heart and chronic kidney disease with heart failure and stage 1 through stage 4 chronic kidney disease, or unspecified chronic kidney disease: Secondary | ICD-10-CM | POA: Diagnosis not present

## 2017-10-14 DIAGNOSIS — J9611 Chronic respiratory failure with hypoxia: Secondary | ICD-10-CM | POA: Diagnosis not present

## 2017-10-14 DIAGNOSIS — E1122 Type 2 diabetes mellitus with diabetic chronic kidney disease: Secondary | ICD-10-CM | POA: Diagnosis not present

## 2017-10-14 DIAGNOSIS — N183 Chronic kidney disease, stage 3 (moderate): Secondary | ICD-10-CM | POA: Diagnosis not present

## 2017-10-17 DIAGNOSIS — N183 Chronic kidney disease, stage 3 (moderate): Secondary | ICD-10-CM | POA: Diagnosis not present

## 2017-10-17 DIAGNOSIS — L89322 Pressure ulcer of left buttock, stage 2: Secondary | ICD-10-CM | POA: Diagnosis not present

## 2017-10-17 DIAGNOSIS — G8929 Other chronic pain: Secondary | ICD-10-CM | POA: Diagnosis not present

## 2017-10-17 DIAGNOSIS — Z794 Long term (current) use of insulin: Secondary | ICD-10-CM | POA: Diagnosis not present

## 2017-10-17 DIAGNOSIS — I69351 Hemiplegia and hemiparesis following cerebral infarction affecting right dominant side: Secondary | ICD-10-CM | POA: Diagnosis not present

## 2017-10-17 DIAGNOSIS — I5033 Acute on chronic diastolic (congestive) heart failure: Secondary | ICD-10-CM | POA: Diagnosis not present

## 2017-10-17 DIAGNOSIS — M549 Dorsalgia, unspecified: Secondary | ICD-10-CM | POA: Diagnosis not present

## 2017-10-17 DIAGNOSIS — Z7982 Long term (current) use of aspirin: Secondary | ICD-10-CM | POA: Diagnosis not present

## 2017-10-17 DIAGNOSIS — E1122 Type 2 diabetes mellitus with diabetic chronic kidney disease: Secondary | ICD-10-CM | POA: Diagnosis not present

## 2017-10-17 DIAGNOSIS — E1142 Type 2 diabetes mellitus with diabetic polyneuropathy: Secondary | ICD-10-CM | POA: Diagnosis not present

## 2017-10-17 DIAGNOSIS — J9611 Chronic respiratory failure with hypoxia: Secondary | ICD-10-CM | POA: Diagnosis not present

## 2017-10-17 DIAGNOSIS — Z9981 Dependence on supplemental oxygen: Secondary | ICD-10-CM | POA: Diagnosis not present

## 2017-10-17 DIAGNOSIS — I130 Hypertensive heart and chronic kidney disease with heart failure and stage 1 through stage 4 chronic kidney disease, or unspecified chronic kidney disease: Secondary | ICD-10-CM | POA: Diagnosis not present

## 2017-10-18 DIAGNOSIS — J9611 Chronic respiratory failure with hypoxia: Secondary | ICD-10-CM | POA: Diagnosis not present

## 2017-10-18 DIAGNOSIS — I130 Hypertensive heart and chronic kidney disease with heart failure and stage 1 through stage 4 chronic kidney disease, or unspecified chronic kidney disease: Secondary | ICD-10-CM | POA: Diagnosis not present

## 2017-10-18 DIAGNOSIS — L89322 Pressure ulcer of left buttock, stage 2: Secondary | ICD-10-CM | POA: Diagnosis not present

## 2017-10-18 DIAGNOSIS — I5033 Acute on chronic diastolic (congestive) heart failure: Secondary | ICD-10-CM | POA: Diagnosis not present

## 2017-10-18 DIAGNOSIS — E1122 Type 2 diabetes mellitus with diabetic chronic kidney disease: Secondary | ICD-10-CM | POA: Diagnosis not present

## 2017-10-18 DIAGNOSIS — N183 Chronic kidney disease, stage 3 (moderate): Secondary | ICD-10-CM | POA: Diagnosis not present

## 2017-10-20 DIAGNOSIS — J9611 Chronic respiratory failure with hypoxia: Secondary | ICD-10-CM | POA: Diagnosis not present

## 2017-10-20 DIAGNOSIS — E1122 Type 2 diabetes mellitus with diabetic chronic kidney disease: Secondary | ICD-10-CM | POA: Diagnosis not present

## 2017-10-20 DIAGNOSIS — I130 Hypertensive heart and chronic kidney disease with heart failure and stage 1 through stage 4 chronic kidney disease, or unspecified chronic kidney disease: Secondary | ICD-10-CM | POA: Diagnosis not present

## 2017-10-20 DIAGNOSIS — L89322 Pressure ulcer of left buttock, stage 2: Secondary | ICD-10-CM | POA: Diagnosis not present

## 2017-10-20 DIAGNOSIS — I5033 Acute on chronic diastolic (congestive) heart failure: Secondary | ICD-10-CM | POA: Diagnosis not present

## 2017-10-20 DIAGNOSIS — N183 Chronic kidney disease, stage 3 (moderate): Secondary | ICD-10-CM | POA: Diagnosis not present

## 2017-10-21 DIAGNOSIS — I130 Hypertensive heart and chronic kidney disease with heart failure and stage 1 through stage 4 chronic kidney disease, or unspecified chronic kidney disease: Secondary | ICD-10-CM | POA: Diagnosis not present

## 2017-10-21 DIAGNOSIS — J9611 Chronic respiratory failure with hypoxia: Secondary | ICD-10-CM | POA: Diagnosis not present

## 2017-10-21 DIAGNOSIS — I5033 Acute on chronic diastolic (congestive) heart failure: Secondary | ICD-10-CM | POA: Diagnosis not present

## 2017-10-21 DIAGNOSIS — L89322 Pressure ulcer of left buttock, stage 2: Secondary | ICD-10-CM | POA: Diagnosis not present

## 2017-10-21 DIAGNOSIS — E1122 Type 2 diabetes mellitus with diabetic chronic kidney disease: Secondary | ICD-10-CM | POA: Diagnosis not present

## 2017-10-21 DIAGNOSIS — N183 Chronic kidney disease, stage 3 (moderate): Secondary | ICD-10-CM | POA: Diagnosis not present

## 2017-10-22 DIAGNOSIS — L89322 Pressure ulcer of left buttock, stage 2: Secondary | ICD-10-CM | POA: Diagnosis not present

## 2017-10-22 DIAGNOSIS — N183 Chronic kidney disease, stage 3 (moderate): Secondary | ICD-10-CM | POA: Diagnosis not present

## 2017-10-22 DIAGNOSIS — J9611 Chronic respiratory failure with hypoxia: Secondary | ICD-10-CM | POA: Diagnosis not present

## 2017-10-22 DIAGNOSIS — E1122 Type 2 diabetes mellitus with diabetic chronic kidney disease: Secondary | ICD-10-CM | POA: Diagnosis not present

## 2017-10-22 DIAGNOSIS — I5033 Acute on chronic diastolic (congestive) heart failure: Secondary | ICD-10-CM | POA: Diagnosis not present

## 2017-10-22 DIAGNOSIS — I130 Hypertensive heart and chronic kidney disease with heart failure and stage 1 through stage 4 chronic kidney disease, or unspecified chronic kidney disease: Secondary | ICD-10-CM | POA: Diagnosis not present

## 2017-10-27 DIAGNOSIS — E1122 Type 2 diabetes mellitus with diabetic chronic kidney disease: Secondary | ICD-10-CM | POA: Diagnosis not present

## 2017-10-27 DIAGNOSIS — I5033 Acute on chronic diastolic (congestive) heart failure: Secondary | ICD-10-CM | POA: Diagnosis not present

## 2017-10-27 DIAGNOSIS — L89322 Pressure ulcer of left buttock, stage 2: Secondary | ICD-10-CM | POA: Diagnosis not present

## 2017-10-27 DIAGNOSIS — J9611 Chronic respiratory failure with hypoxia: Secondary | ICD-10-CM | POA: Diagnosis not present

## 2017-10-27 DIAGNOSIS — I130 Hypertensive heart and chronic kidney disease with heart failure and stage 1 through stage 4 chronic kidney disease, or unspecified chronic kidney disease: Secondary | ICD-10-CM | POA: Diagnosis not present

## 2017-10-27 DIAGNOSIS — N183 Chronic kidney disease, stage 3 (moderate): Secondary | ICD-10-CM | POA: Diagnosis not present

## 2017-10-28 DIAGNOSIS — N183 Chronic kidney disease, stage 3 (moderate): Secondary | ICD-10-CM | POA: Diagnosis not present

## 2017-10-28 DIAGNOSIS — L89322 Pressure ulcer of left buttock, stage 2: Secondary | ICD-10-CM | POA: Diagnosis not present

## 2017-10-28 DIAGNOSIS — I5033 Acute on chronic diastolic (congestive) heart failure: Secondary | ICD-10-CM | POA: Diagnosis not present

## 2017-10-28 DIAGNOSIS — I130 Hypertensive heart and chronic kidney disease with heart failure and stage 1 through stage 4 chronic kidney disease, or unspecified chronic kidney disease: Secondary | ICD-10-CM | POA: Diagnosis not present

## 2017-10-28 DIAGNOSIS — J9611 Chronic respiratory failure with hypoxia: Secondary | ICD-10-CM | POA: Diagnosis not present

## 2017-10-28 DIAGNOSIS — E1122 Type 2 diabetes mellitus with diabetic chronic kidney disease: Secondary | ICD-10-CM | POA: Diagnosis not present

## 2017-10-29 DIAGNOSIS — E1122 Type 2 diabetes mellitus with diabetic chronic kidney disease: Secondary | ICD-10-CM | POA: Diagnosis not present

## 2017-10-29 DIAGNOSIS — I130 Hypertensive heart and chronic kidney disease with heart failure and stage 1 through stage 4 chronic kidney disease, or unspecified chronic kidney disease: Secondary | ICD-10-CM | POA: Diagnosis not present

## 2017-10-29 DIAGNOSIS — N183 Chronic kidney disease, stage 3 (moderate): Secondary | ICD-10-CM | POA: Diagnosis not present

## 2017-10-29 DIAGNOSIS — J9611 Chronic respiratory failure with hypoxia: Secondary | ICD-10-CM | POA: Diagnosis not present

## 2017-10-29 DIAGNOSIS — L89322 Pressure ulcer of left buttock, stage 2: Secondary | ICD-10-CM | POA: Diagnosis not present

## 2017-10-29 DIAGNOSIS — I5033 Acute on chronic diastolic (congestive) heart failure: Secondary | ICD-10-CM | POA: Diagnosis not present

## 2017-11-01 DIAGNOSIS — N183 Chronic kidney disease, stage 3 (moderate): Secondary | ICD-10-CM | POA: Diagnosis not present

## 2017-11-01 DIAGNOSIS — I130 Hypertensive heart and chronic kidney disease with heart failure and stage 1 through stage 4 chronic kidney disease, or unspecified chronic kidney disease: Secondary | ICD-10-CM | POA: Diagnosis not present

## 2017-11-01 DIAGNOSIS — I5033 Acute on chronic diastolic (congestive) heart failure: Secondary | ICD-10-CM | POA: Diagnosis not present

## 2017-11-01 DIAGNOSIS — E1122 Type 2 diabetes mellitus with diabetic chronic kidney disease: Secondary | ICD-10-CM | POA: Diagnosis not present

## 2017-11-01 DIAGNOSIS — L89322 Pressure ulcer of left buttock, stage 2: Secondary | ICD-10-CM | POA: Diagnosis not present

## 2017-11-01 DIAGNOSIS — J9611 Chronic respiratory failure with hypoxia: Secondary | ICD-10-CM | POA: Diagnosis not present

## 2017-11-02 DIAGNOSIS — J9611 Chronic respiratory failure with hypoxia: Secondary | ICD-10-CM | POA: Diagnosis not present

## 2017-11-02 DIAGNOSIS — I130 Hypertensive heart and chronic kidney disease with heart failure and stage 1 through stage 4 chronic kidney disease, or unspecified chronic kidney disease: Secondary | ICD-10-CM | POA: Diagnosis not present

## 2017-11-02 DIAGNOSIS — I5033 Acute on chronic diastolic (congestive) heart failure: Secondary | ICD-10-CM | POA: Diagnosis not present

## 2017-11-02 DIAGNOSIS — E1122 Type 2 diabetes mellitus with diabetic chronic kidney disease: Secondary | ICD-10-CM | POA: Diagnosis not present

## 2017-11-02 DIAGNOSIS — N183 Chronic kidney disease, stage 3 (moderate): Secondary | ICD-10-CM | POA: Diagnosis not present

## 2017-11-02 DIAGNOSIS — L89322 Pressure ulcer of left buttock, stage 2: Secondary | ICD-10-CM | POA: Diagnosis not present

## 2017-11-03 DIAGNOSIS — L89322 Pressure ulcer of left buttock, stage 2: Secondary | ICD-10-CM | POA: Diagnosis not present

## 2017-11-03 DIAGNOSIS — E1122 Type 2 diabetes mellitus with diabetic chronic kidney disease: Secondary | ICD-10-CM | POA: Diagnosis not present

## 2017-11-03 DIAGNOSIS — J9611 Chronic respiratory failure with hypoxia: Secondary | ICD-10-CM | POA: Diagnosis not present

## 2017-11-03 DIAGNOSIS — I5033 Acute on chronic diastolic (congestive) heart failure: Secondary | ICD-10-CM | POA: Diagnosis not present

## 2017-11-03 DIAGNOSIS — N183 Chronic kidney disease, stage 3 (moderate): Secondary | ICD-10-CM | POA: Diagnosis not present

## 2017-11-03 DIAGNOSIS — I130 Hypertensive heart and chronic kidney disease with heart failure and stage 1 through stage 4 chronic kidney disease, or unspecified chronic kidney disease: Secondary | ICD-10-CM | POA: Diagnosis not present

## 2017-11-04 DIAGNOSIS — I130 Hypertensive heart and chronic kidney disease with heart failure and stage 1 through stage 4 chronic kidney disease, or unspecified chronic kidney disease: Secondary | ICD-10-CM | POA: Diagnosis not present

## 2017-11-04 DIAGNOSIS — N183 Chronic kidney disease, stage 3 (moderate): Secondary | ICD-10-CM | POA: Diagnosis not present

## 2017-11-04 DIAGNOSIS — L89322 Pressure ulcer of left buttock, stage 2: Secondary | ICD-10-CM | POA: Diagnosis not present

## 2017-11-04 DIAGNOSIS — E1122 Type 2 diabetes mellitus with diabetic chronic kidney disease: Secondary | ICD-10-CM | POA: Diagnosis not present

## 2017-11-04 DIAGNOSIS — J9611 Chronic respiratory failure with hypoxia: Secondary | ICD-10-CM | POA: Diagnosis not present

## 2017-11-04 DIAGNOSIS — I5033 Acute on chronic diastolic (congestive) heart failure: Secondary | ICD-10-CM | POA: Diagnosis not present

## 2017-11-05 ENCOUNTER — Other Ambulatory Visit: Payer: Self-pay

## 2017-11-05 ENCOUNTER — Inpatient Hospital Stay (HOSPITAL_COMMUNITY)
Admission: EM | Admit: 2017-11-05 | Discharge: 2017-11-10 | DRG: 291 | Disposition: A | Discharge status: Home Health | Payer: Medicare Other | Attending: Family Medicine | Admitting: Family Medicine

## 2017-11-05 ENCOUNTER — Emergency Department (HOSPITAL_COMMUNITY): Payer: Medicare Other

## 2017-11-05 ENCOUNTER — Encounter (HOSPITAL_COMMUNITY): Payer: Self-pay

## 2017-11-05 DIAGNOSIS — Z882 Allergy status to sulfonamides status: Secondary | ICD-10-CM

## 2017-11-05 DIAGNOSIS — L89613 Pressure ulcer of right heel, stage 3: Secondary | ICD-10-CM | POA: Diagnosis present

## 2017-11-05 DIAGNOSIS — Z79899 Other long term (current) drug therapy: Secondary | ICD-10-CM

## 2017-11-05 DIAGNOSIS — I714 Abdominal aortic aneurysm, without rupture: Secondary | ICD-10-CM | POA: Diagnosis present

## 2017-11-05 DIAGNOSIS — R279 Unspecified lack of coordination: Secondary | ICD-10-CM | POA: Diagnosis not present

## 2017-11-05 DIAGNOSIS — Z91013 Allergy to seafood: Secondary | ICD-10-CM

## 2017-11-05 DIAGNOSIS — L304 Erythema intertrigo: Secondary | ICD-10-CM | POA: Diagnosis present

## 2017-11-05 DIAGNOSIS — I10 Essential (primary) hypertension: Secondary | ICD-10-CM | POA: Diagnosis present

## 2017-11-05 DIAGNOSIS — J9611 Chronic respiratory failure with hypoxia: Secondary | ICD-10-CM | POA: Diagnosis present

## 2017-11-05 DIAGNOSIS — G8191 Hemiplegia, unspecified affecting right dominant side: Secondary | ICD-10-CM

## 2017-11-05 DIAGNOSIS — E1140 Type 2 diabetes mellitus with diabetic neuropathy, unspecified: Secondary | ICD-10-CM | POA: Diagnosis present

## 2017-11-05 DIAGNOSIS — N390 Urinary tract infection, site not specified: Secondary | ICD-10-CM | POA: Diagnosis present

## 2017-11-05 DIAGNOSIS — Z9981 Dependence on supplemental oxygen: Secondary | ICD-10-CM

## 2017-11-05 DIAGNOSIS — Z7982 Long term (current) use of aspirin: Secondary | ICD-10-CM

## 2017-11-05 DIAGNOSIS — N184 Chronic kidney disease, stage 4 (severe): Secondary | ICD-10-CM | POA: Diagnosis not present

## 2017-11-05 DIAGNOSIS — Z880 Allergy status to penicillin: Secondary | ICD-10-CM

## 2017-11-05 DIAGNOSIS — B372 Candidiasis of skin and nail: Secondary | ICD-10-CM | POA: Diagnosis present

## 2017-11-05 DIAGNOSIS — Z7902 Long term (current) use of antithrombotics/antiplatelets: Secondary | ICD-10-CM

## 2017-11-05 DIAGNOSIS — I110 Hypertensive heart disease with heart failure: Secondary | ICD-10-CM | POA: Diagnosis not present

## 2017-11-05 DIAGNOSIS — N3000 Acute cystitis without hematuria: Secondary | ICD-10-CM | POA: Diagnosis not present

## 2017-11-05 DIAGNOSIS — E119 Type 2 diabetes mellitus without complications: Secondary | ICD-10-CM

## 2017-11-05 DIAGNOSIS — I509 Heart failure, unspecified: Secondary | ICD-10-CM | POA: Diagnosis not present

## 2017-11-05 DIAGNOSIS — Z833 Family history of diabetes mellitus: Secondary | ICD-10-CM

## 2017-11-05 DIAGNOSIS — I501 Left ventricular failure: Secondary | ICD-10-CM | POA: Diagnosis not present

## 2017-11-05 DIAGNOSIS — Z7401 Bed confinement status: Secondary | ICD-10-CM

## 2017-11-05 DIAGNOSIS — I69351 Hemiplegia and hemiparesis following cerebral infarction affecting right dominant side: Secondary | ICD-10-CM

## 2017-11-05 DIAGNOSIS — Z888 Allergy status to other drugs, medicaments and biological substances status: Secondary | ICD-10-CM

## 2017-11-05 DIAGNOSIS — I130 Hypertensive heart and chronic kidney disease with heart failure and stage 1 through stage 4 chronic kidney disease, or unspecified chronic kidney disease: Secondary | ICD-10-CM | POA: Diagnosis not present

## 2017-11-05 DIAGNOSIS — Z885 Allergy status to narcotic agent status: Secondary | ICD-10-CM

## 2017-11-05 DIAGNOSIS — R0600 Dyspnea, unspecified: Secondary | ICD-10-CM | POA: Diagnosis present

## 2017-11-05 DIAGNOSIS — B9689 Other specified bacterial agents as the cause of diseases classified elsewhere: Secondary | ICD-10-CM | POA: Diagnosis present

## 2017-11-05 DIAGNOSIS — E519 Thiamine deficiency, unspecified: Secondary | ICD-10-CM | POA: Diagnosis present

## 2017-11-05 DIAGNOSIS — I252 Old myocardial infarction: Secondary | ICD-10-CM

## 2017-11-05 DIAGNOSIS — G8929 Other chronic pain: Secondary | ICD-10-CM | POA: Diagnosis present

## 2017-11-05 DIAGNOSIS — R159 Full incontinence of feces: Secondary | ICD-10-CM | POA: Diagnosis present

## 2017-11-05 DIAGNOSIS — E1122 Type 2 diabetes mellitus with diabetic chronic kidney disease: Secondary | ICD-10-CM | POA: Diagnosis present

## 2017-11-05 DIAGNOSIS — L89220 Pressure ulcer of left hip, unstageable: Secondary | ICD-10-CM | POA: Diagnosis present

## 2017-11-05 DIAGNOSIS — L409 Psoriasis, unspecified: Secondary | ICD-10-CM | POA: Diagnosis present

## 2017-11-05 DIAGNOSIS — Z90710 Acquired absence of both cervix and uterus: Secondary | ICD-10-CM

## 2017-11-05 DIAGNOSIS — L89152 Pressure ulcer of sacral region, stage 2: Secondary | ICD-10-CM | POA: Diagnosis present

## 2017-11-05 DIAGNOSIS — Z91048 Other nonmedicinal substance allergy status: Secondary | ICD-10-CM

## 2017-11-05 DIAGNOSIS — I5033 Acute on chronic diastolic (congestive) heart failure: Secondary | ICD-10-CM | POA: Diagnosis present

## 2017-11-05 DIAGNOSIS — M549 Dorsalgia, unspecified: Secondary | ICD-10-CM | POA: Diagnosis present

## 2017-11-05 DIAGNOSIS — Z87891 Personal history of nicotine dependence: Secondary | ICD-10-CM

## 2017-11-05 DIAGNOSIS — Z87442 Personal history of urinary calculi: Secondary | ICD-10-CM

## 2017-11-05 DIAGNOSIS — E785 Hyperlipidemia, unspecified: Secondary | ICD-10-CM | POA: Diagnosis present

## 2017-11-05 DIAGNOSIS — R05 Cough: Secondary | ICD-10-CM | POA: Diagnosis not present

## 2017-11-05 DIAGNOSIS — Z1619 Resistance to other specified beta lactam antibiotics: Secondary | ICD-10-CM | POA: Diagnosis present

## 2017-11-05 DIAGNOSIS — Z794 Long term (current) use of insulin: Secondary | ICD-10-CM

## 2017-11-05 DIAGNOSIS — Z8249 Family history of ischemic heart disease and other diseases of the circulatory system: Secondary | ICD-10-CM

## 2017-11-05 DIAGNOSIS — I63523 Cerebral infarction due to unspecified occlusion or stenosis of bilateral anterior cerebral arteries: Secondary | ICD-10-CM | POA: Diagnosis not present

## 2017-11-05 DIAGNOSIS — R32 Unspecified urinary incontinence: Secondary | ICD-10-CM | POA: Diagnosis present

## 2017-11-05 LAB — COMPREHENSIVE METABOLIC PANEL
ALT: 13 U/L — AB (ref 14–54)
AST: 30 U/L (ref 15–41)
Albumin: 2.7 g/dL — ABNORMAL LOW (ref 3.5–5.0)
Alkaline Phosphatase: 42 U/L (ref 38–126)
Anion gap: 12 (ref 5–15)
BUN: 45 mg/dL — AB (ref 6–20)
CHLORIDE: 105 mmol/L (ref 101–111)
CO2: 26 mmol/L (ref 22–32)
CREATININE: 1.69 mg/dL — AB (ref 0.44–1.00)
Calcium: 8.8 mg/dL — ABNORMAL LOW (ref 8.9–10.3)
GFR calc Af Amer: 36 mL/min — ABNORMAL LOW (ref >60)
GFR calc non Af Amer: 31 mL/min — ABNORMAL LOW (ref >60)
Glucose, Bld: 96 mg/dL (ref 65–99)
Potassium: 4.1 mmol/L (ref 3.5–5.1)
Sodium: 143 mmol/L (ref 135–145)
Total Bilirubin: 1.4 mg/dL — ABNORMAL HIGH (ref 0.3–1.2)
Total Protein: 7.1 g/dL (ref 6.5–8.1)

## 2017-11-05 LAB — URINALYSIS, ROUTINE W REFLEX MICROSCOPIC
Bilirubin Urine: NEGATIVE
Glucose, UA: NEGATIVE mg/dL
Ketones, ur: NEGATIVE mg/dL
Nitrite: POSITIVE — AB
PROTEIN: 30 mg/dL — AB
Specific Gravity, Urine: 1.01 (ref 1.005–1.030)
pH: 5 (ref 5.0–8.0)

## 2017-11-05 LAB — CBC WITH DIFFERENTIAL/PLATELET
BASOS ABS: 0 10*3/uL (ref 0.0–0.1)
Basophils Relative: 0 %
EOS PCT: 1 %
Eosinophils Absolute: 0.1 10*3/uL (ref 0.0–0.7)
HEMATOCRIT: 34.4 % — AB (ref 36.0–46.0)
HEMOGLOBIN: 10 g/dL — AB (ref 12.0–15.0)
LYMPHS PCT: 24 %
Lymphs Abs: 1.5 10*3/uL (ref 0.7–4.0)
MCH: 28.2 pg (ref 26.0–34.0)
MCHC: 29.1 g/dL — ABNORMAL LOW (ref 30.0–36.0)
MCV: 97.2 fL (ref 78.0–100.0)
Monocytes Absolute: 0.3 10*3/uL (ref 0.1–1.0)
Monocytes Relative: 4 %
NEUTROS ABS: 4.4 10*3/uL (ref 1.7–7.7)
NEUTROS PCT: 71 %
PLATELETS: 221 10*3/uL (ref 150–400)
RBC: 3.54 MIL/uL — AB (ref 3.87–5.11)
RDW: 17.5 % — ABNORMAL HIGH (ref 11.5–15.5)
WBC: 6.2 10*3/uL (ref 4.0–10.5)

## 2017-11-05 LAB — BRAIN NATRIURETIC PEPTIDE: B NATRIURETIC PEPTIDE 5: 1193 pg/mL — AB (ref 0.0–100.0)

## 2017-11-05 LAB — GLUCOSE, CAPILLARY: Glucose-Capillary: 108 mg/dL — ABNORMAL HIGH (ref 65–99)

## 2017-11-05 LAB — TROPONIN I: Troponin I: <0.03 ng/mL (ref <0.03)

## 2017-11-05 MED ORDER — FUROSEMIDE 10 MG/ML IJ SOLN
40.0000 mg | Freq: Once | INTRAMUSCULAR | Status: AC
  Administered 2017-11-05: 40 mg via INTRAMUSCULAR
  Filled 2017-11-05: qty 4

## 2017-11-05 MED ORDER — VITAMIN B-1 100 MG PO TABS
100.0000 mg | ORAL_TABLET | Freq: Every day | ORAL | Status: DC
  Administered 2017-11-06 – 2017-11-10 (×5): 100 mg via ORAL
  Filled 2017-11-05 (×5): qty 1

## 2017-11-05 MED ORDER — FLUCONAZOLE 150 MG PO TABS
150.0000 mg | ORAL_TABLET | Freq: Once | ORAL | Status: AC
  Administered 2017-11-05: 150 mg via ORAL

## 2017-11-05 MED ORDER — SODIUM CHLORIDE 0.9% FLUSH: 3.0000 mL | INTRAVENOUS | Status: DC | PRN

## 2017-11-05 MED ORDER — HYDRALAZINE HCL 25 MG PO TABS
100.0000 mg | ORAL_TABLET | Freq: Three times a day (TID) | ORAL | Status: DC
  Administered 2017-11-05 – 2017-11-10 (×15): 100 mg via ORAL
  Filled 2017-11-05 (×15): qty 4

## 2017-11-05 MED ORDER — FOLIC ACID 1 MG PO TABS
1.0000 mg | ORAL_TABLET | Freq: Every day | ORAL | Status: DC
  Administered 2017-11-06 – 2017-11-10 (×5): 1 mg via ORAL
  Filled 2017-11-05 (×5): qty 1

## 2017-11-05 MED ORDER — FENOFIBRATE 160 MG PO TABS
160.0000 mg | ORAL_TABLET | Freq: Every morning | ORAL | Status: DC
  Administered 2017-11-06 – 2017-11-10 (×5): 160 mg via ORAL
  Filled 2017-11-05 (×5): qty 1

## 2017-11-05 MED ORDER — ONDANSETRON HCL 4 MG/2ML IJ SOLN: 4.0000 mg | Freq: Four times a day (QID) | INTRAMUSCULAR | Status: DC | PRN

## 2017-11-05 MED ORDER — DEXTROSE 5 % IV SOLN
1.0000 g | INTRAVENOUS | Status: DC
  Administered 2017-11-06 – 2017-11-07 (×2): 1 g via INTRAVENOUS
  Filled 2017-11-05 (×3): qty 10

## 2017-11-05 MED ORDER — SODIUM CHLORIDE 0.9 % IV SOLN: 250.0000 mL | INTRAVENOUS | Status: DC | PRN

## 2017-11-05 MED ORDER — NYSTATIN 100000 UNIT/GM EX CREA
TOPICAL_CREAM | Freq: Three times a day (TID) | CUTANEOUS | Status: DC
  Administered 2017-11-05: 1 via TOPICAL
  Administered 2017-11-06 – 2017-11-10 (×14): via TOPICAL
  Filled 2017-11-05 (×4): qty 15

## 2017-11-05 MED ORDER — INSULIN ASPART 100 UNIT/ML ~~LOC~~ SOLN
0.0000 [IU] | Freq: Three times a day (TID) | SUBCUTANEOUS | Status: DC
  Administered 2017-11-06 – 2017-11-07 (×2): 1 [IU] via SUBCUTANEOUS

## 2017-11-05 MED ORDER — ENOXAPARIN SODIUM 40 MG/0.4ML ~~LOC~~ SOLN
40.0000 mg | SUBCUTANEOUS | Status: DC
  Administered 2017-11-05 – 2017-11-09 (×5): 40 mg via SUBCUTANEOUS
  Filled 2017-11-05 (×5): qty 0.4

## 2017-11-05 MED ORDER — SIMVASTATIN 20 MG PO TABS
20.0000 mg | ORAL_TABLET | Freq: Every day | ORAL | Status: DC
  Administered 2017-11-05 – 2017-11-10 (×6): 20 mg via ORAL
  Filled 2017-11-05 (×2): qty 2
  Filled 2017-11-05: qty 1
  Filled 2017-11-05: qty 2
  Filled 2017-11-05 (×2): qty 1

## 2017-11-05 MED ORDER — FUROSEMIDE 10 MG/ML IJ SOLN
40.0000 mg | Freq: Two times a day (BID) | INTRAMUSCULAR | Status: DC
  Administered 2017-11-06: 40 mg via INTRAVENOUS
  Filled 2017-11-05: qty 4

## 2017-11-05 MED ORDER — ASPIRIN 325 MG PO TABS
325.0000 mg | ORAL_TABLET | Freq: Every day | ORAL | Status: DC
  Administered 2017-11-06 – 2017-11-10 (×5): 325 mg via ORAL
  Filled 2017-11-05 (×5): qty 1

## 2017-11-05 MED ORDER — CARVEDILOL 3.125 MG PO TABS
6.2500 mg | ORAL_TABLET | Freq: Two times a day (BID) | ORAL | Status: DC
  Administered 2017-11-05 – 2017-11-10 (×11): 6.25 mg via ORAL
  Filled 2017-11-05: qty 1
  Filled 2017-11-05: qty 2
  Filled 2017-11-05: qty 1
  Filled 2017-11-05 (×2): qty 2
  Filled 2017-11-05 (×2): qty 1
  Filled 2017-11-05: qty 2
  Filled 2017-11-05: qty 1
  Filled 2017-11-05 (×2): qty 2

## 2017-11-05 MED ORDER — VITAMIN B-12 1000 MCG PO TABS
1000.0000 ug | ORAL_TABLET | Freq: Every day | ORAL | Status: DC
  Administered 2017-11-06 – 2017-11-10 (×5): 1000 ug via ORAL
  Filled 2017-11-05 (×5): qty 1

## 2017-11-05 MED ORDER — SODIUM CHLORIDE 0.9% FLUSH
3.0000 mL | Freq: Two times a day (BID) | INTRAVENOUS | Status: DC
  Administered 2017-11-05 – 2017-11-10 (×10): 3 mL via INTRAVENOUS

## 2017-11-05 MED ORDER — INSULIN GLARGINE 100 UNIT/ML ~~LOC~~ SOLN
10.0000 [IU] | Freq: Every day | SUBCUTANEOUS | Status: DC
Start: 2017-11-05 — End: 2017-11-11
  Administered 2017-11-05 – 2017-11-09 (×5): 10 [IU] via SUBCUTANEOUS
  Filled 2017-11-05 (×6): qty 0.1

## 2017-11-05 MED ORDER — DEXTROSE 5 % IV SOLN
1.0000 g | Freq: Once | INTRAVENOUS | Status: AC
  Administered 2017-11-05: 1 g via INTRAVENOUS
  Filled 2017-11-05: qty 10

## 2017-11-05 MED ORDER — ACETAMINOPHEN 325 MG PO TABS: 650.0000 mg | ORAL_TABLET | ORAL | Status: DC | PRN

## 2017-11-05 MED ORDER — AMLODIPINE BESYLATE 5 MG PO TABS
10.0000 mg | ORAL_TABLET | Freq: Every day | ORAL | Status: DC
  Administered 2017-11-06 – 2017-11-10 (×5): 10 mg via ORAL
  Filled 2017-11-05 (×5): qty 2

## 2017-11-05 MED ORDER — CLOPIDOGREL BISULFATE 75 MG PO TABS
75.0000 mg | ORAL_TABLET | Freq: Every day | ORAL | Status: DC
  Administered 2017-11-06 – 2017-11-10 (×5): 75 mg via ORAL
  Filled 2017-11-05 (×5): qty 1

## 2017-11-05 MED ORDER — GABAPENTIN 100 MG PO CAPS
200.0000 mg | ORAL_CAPSULE | Freq: Every day | ORAL | Status: DC
  Administered 2017-11-05 – 2017-11-09 (×5): 200 mg via ORAL
  Filled 2017-11-05 (×5): qty 2

## 2017-11-05 MED ORDER — POTASSIUM CHLORIDE CRYS ER 20 MEQ PO TBCR
40.0000 meq | EXTENDED_RELEASE_TABLET | Freq: Every day | ORAL | Status: DC
  Administered 2017-11-06 – 2017-11-10 (×5): 40 meq via ORAL
  Filled 2017-11-05 (×5): qty 2

## 2017-11-05 NOTE — ED Triage Notes (Signed)
Patient brought in by EMS from home. States she was told by pcp to come to ED for abnormal lab that patient could be in heart failure. Patient reports of non-productive cough.

## 2017-11-05 NOTE — ED Provider Notes (Signed)
[facility] EMERGENCY DEPARTMENT Provider Note   CSN: [id number] Arrival date & time: 11/05/17  1535     History   Chief Complaint No chief complaint on file.   HPI Madison Reed is a 67 y.o. female.  HPI 67 y.o. Female ho cva , diabetes, hypertension , kidney diseas, aaa presents from home stating she was sent in due to chf.  She is on oxygen at home and denies increased dyspnea but endorses more swelling diffusely.  She states home health nurse called her daughter and told she needed to come to hospital.  Patient states unable to get oob and must use a hoist.  Endorses cough, nonproductive,  for several days. No dyspnea, chest pain, nausea vomiting or diarrhea.  Sttes she has "sores" on her bottom.  Denies change in urination.  PMD Dr. Nevada Crane Past Medical History:  Diagnosis Date  . Abdominal aneurysm ([place])   . Bone spur   . Chronic back pain   . CVA (cerebral infarction)   . Diabetes mellitus without complication ([place])   . Diastolic heart failure ([place])   . Hypertension   . Kidney stones   . Neuropathy   . Psoriasis   . Stroke ([place])   . Thiamine deficiency 08/28/2017  . Wound, open, foot     Patient Active Problem List   Diagnosis Date Noted  . CHF exacerbation ([place])   . Elevated troponin   . Sepsis ([place]) 08/28/2017  . UTI (urinary tract infection) 08/28/2017  . CKD (chronic kidney disease) stage 4, GFR 15-29 ml/min (HCC) 08/28/2017  . Acute on chronic respiratory failure with hypoxia ([place]) 08/28/2017  . Encephalopathy 08/28/2017  . Anemia 08/28/2017  . NSTEMI (non-ST elevated myocardial infarction) ([place]) 08/28/2017  . Acute cystitis without hematuria   . Stage II pressure ulcer of sacral region 06/14/2017  . Demand ischemia ([place]) 06/13/2017  . Essential hypertension 06/13/2017  . Lobar pneumonia ([place]) 06/10/2017  . Pressure injury of skin 06/10/2017  . Troponin level elevated 06/10/2017  . DM type 2 (diabetes mellitus, type 2) ([place]) 06/10/2017  . Chronic  respiratory failure with hypoxia ([place]) 06/10/2017  . Acute on chronic diastolic CHF (congestive heart failure) ([place]) 05/27/2017  . Dyspnea   . AKI (acute kidney injury) ([place]) 04/30/2017  . Hypoglycemia 04/30/2017  . Septic arthritis of IP joint of toe ([place])   . DM type 2, uncontrolled, with lower extremity ulcer ([place]) 03/23/2015  . Diabetic ulcer of left foot ([place])   . Weakness of right arm/hand 09/18/2013  . Unstable balance 08/11/2013  . Foot and toe(s), blister, infected 08/11/2013  . Difficulty walking 08/11/2013  . Weakness of right leg 08/11/2013  . Cerebrovascular accident (CVA) due to bilateral stenosis of anterior cerebral arteries ([place]) 08/03/2013  . Hypokalemia 08/03/2013  . Type 2 diabetes with nephropathy ([place])   . Neuropathy   . Chronic back pain   . Psoriasis   . Wound, open, foot     Past Surgical History:  Procedure Laterality Date  . ABDOMINAL HYSTERECTOMY    . BLADDER SUSPENSION    . VESICOVAGINAL FISTULA CLOSURE W/ TAH      OB History    Gravida Para Term Preterm AB Living   2 2 2     2    SAB TAB Ectopic Multiple Live Births                   Home Medications    Prior to Admission medications   Medication Sig  Start Date End Date Taking? Authorizing Provider  amLODipine (NORVASC) 10 MG tablet Take 10 mg by mouth daily.    [provider]  aspirin 325 MG tablet Take 1 tablet (325 mg total) by mouth daily. 05/07/17   Isaac Bliss, Rayford Halsted, MD  carvedilol (COREG) 6.25 MG tablet Take 1 tablet (6.25 mg total) by mouth 2 (two) times daily with a meal. 05/06/17   Isaac Bliss, Rayford Halsted, MD  clopidogrel (PLAVIX) 75 MG tablet Take 1 tablet (75 mg total) by mouth daily. 09/05/17   Regalado, Belkys A, MD  cyanocobalamin 1000 MCG tablet Take 1 tablet (1,000 mcg total) by mouth daily. 06/17/17   Reyne Dumas, MD  fenofibrate 160 MG tablet Take 160 mg by mouth daily.    [provider]  fexofenadine (ALLEGRA) 180 MG tablet Take 180 mg by  mouth daily.    [provider]  folic acid (FOLVITE) 1 MG tablet Take 1 tablet (1 mg total) by mouth daily. 06/17/17   Reyne Dumas, MD  furosemide (LASIX) 40 MG tablet Take 1 tablet (40 mg total) by mouth daily. 09/05/17   Regalado, Belkys A, MD  gabapentin (NEURONTIN) 100 MG capsule Take 200 mg by mouth at bedtime.     [provider]  hydrALAZINE (APRESOLINE) 100 MG tablet Take 1 tablet (100 mg total) by mouth every 8 (eight) hours. 05/08/17   Reyne Dumas, MD  insulin glargine (LANTUS) 100 UNIT/ML injection Inject 0.1 mLs (10 Units total) into the skin daily. 09/04/17   Regalado, Belkys A, MD  omeprazole (PRILOSEC) 20 MG capsule Take 20 mg by mouth daily.    [provider]  potassium chloride SA (K-DUR,KLOR-CON) 20 MEQ tablet Take 2 tablets (40 mEq total) by mouth daily. 06/17/17   Reyne Dumas, MD  simvastatin (ZOCOR) 20 MG tablet Take 1 tablet (20 mg total) by mouth daily at 6 PM. 09/04/17   Regalado, Belkys A, MD  thiamine (VITAMIN B-1) 100 MG tablet Take 1 tablet (100 mg total) by mouth daily. 09/04/17   Regalado, Cassie Freer, MD    Family History Family History  Problem Relation Age of Onset  . Aneurysm Mother   . Diabetes Mother   . Hypertension Mother   . Cancer Neg Hx   . Heart disease Neg Hx   . Stroke Neg Hx     Social History Social History   Tobacco Use  . Smoking status: Former Smoker    Packs/day: 1.00    Years: 30.00    Pack years: 30.00  . Smokeless tobacco: Never Used  Substance Use Topics  . Alcohol use: No  . Drug use: No     Allergies   Glipizide; Penicillins; Clonidine derivatives; Codeine; Hydrocodone-acetaminophen; Plasticized base [plastibase]; Shellfish allergy; Tape; and Sulfa antibiotics   Review of Systems Review of Systems  Constitutional: Negative.  Negative for activity change, appetite change, chills, diaphoresis, fatigue, fever and unexpected weight change.  HENT: Negative.   Eyes: Negative.   Respiratory:  Positive for cough.   Cardiovascular: Negative.   Gastrointestinal: Negative.   Endocrine: Negative.   Genitourinary: Negative.   Musculoskeletal: Negative.   Skin: Negative.   Allergic/Immunologic: Negative.   Neurological: Negative.   Hematological: Negative.   Psychiatric/Behavioral: Negative.   All other systems reviewed and are negative.    Physical Exam Updated Vital Signs BP (!) 153/72 (BP Location: Left Arm)   Pulse 77   Temp 97.9 F (36.6 C) (Oral)   Resp 18   SpO2  92%   Physical Exam  Constitutional: She is oriented to person, place, and time. She appears well-developed and well-nourished. No distress.  HENT:  Head: Normocephalic and atraumatic.  Right Ear: External ear normal.  Left Ear: External ear normal.  Mucous membranes appear dry  Eyes: Pupils are equal, round, and reactive to light.  Neck: Normal range of motion.  Cardiovascular: Normal rate and regular rhythm.  Pulmonary/Chest: Effort normal and breath sounds normal.  Abdominal: Soft. Bowel sounds are normal.  Musculoskeletal:  No obvious deformities noted.  She is generally weak and is unable to move her extremities well on her own.  Extremities are ranged without any significant abnormality or tenderness or pain noted.  Neurological: She is alert and oriented to person, place, and time. No cranial nerve deficit.  Patient with generalized weakness right does appear somewhat greater than left patient states she has had stroke on the right before  Skin: Skin is warm. Capillary refill takes less than 2 seconds.  Erythema present in the skin folds intertriginous areas perineal area  Psychiatric: She has a normal mood and affect.  Nursing note and vitals reviewed.    ED Treatments / Results  Labs (all labs ordered are listed, but only abnormal results are displayed) Labs Reviewed - No data to display  EKG  EKG Interpretation  Date/Time:  Friday November 05 2017 15:57:03 EST Ventricular Rate:   75 PR Interval:    QRS Duration: 107 QT Interval:  378 QTC Calculation: 423 R Axis:   84 Text Interpretation:  Normal sinus rhythm t wave inversion present v1-v5 with deepened inversion v1-v5 from prior although present v3 and v4 previously Confirmed by Pattricia Boss [phone]) on 11/05/2017 5:31:14 PM       Radiology Dg Chest Port 1 View  Result Date: 11/05/2017 CLINICAL DATA:  CHF EXAM: PORTABLE CHEST 1 VIEW COMPARISON:  08/30/2017 FINDINGS: Stable cardiomegaly with aortic atherosclerosis. Interval increase in pulmonary vascular redistribution and edema with obscuration of the left hemidiaphragm likely from layering effusion with atelectasis. There is cephalization of the pulmonary vessels. Trace fissural fluid noted on the right. No acute osseous abnormality. IMPRESSION: Slight worsening of CHF. Stable cardiomegaly. Aortic atherosclerosis. Bilateral left greater than right pleural effusions. Electronically Signed   By: Ashley Royalty M.D.   On: 11/05/2017 16:14    Procedures Procedures (including critical care time)  Medications Ordered in ED Medications - No data to display   Initial Impression / Assessment and Plan / ED Course  I have reviewed the triage vital signs and the nursing notes.  Pertinent labs & imaging results that were available during my care of the patient were reviewed by me and considered in my medical decision making (see chart for details).     Chronically ill appearing female presents today with only complaint of told her chf was worse.   1- chf- mild worsening of chronic pleural effusions, bnp- 11930, troponin negative, ekg with some worsening t wave inversion. Lasix ordered and given here.  2- uti- tntc wbc- urine cultured, rocephin iv ordered 3- rash- c.w. Candida 4- renal insufficiency appears stable  Discussed with Dr.David and she will see for admission Final Clinical Impressions(s) / ED Diagnoses   Final diagnoses:  Urinary tract infection without  hematuria, site unspecified  Congestive heart failure, unspecified HF chronicity, unspecified heart failure type [facility])    ED Discharge Orders    None       Pattricia Boss, MD 11/05/17 Valerie Roys

## 2017-11-05 NOTE — H&P (Signed)
History and Physical    Madison Reed XNA:[id number] DOB: [birth year] DOA: 11/05/2017  PCP: Celene Squibb, MD  Patient coming from: Home  Chief Complaint: Sent by PCP for abnormal lab  HPI: Madison Reed is a 67 y.o. female with medical history significant of diastolic congestive heart failure, CVA in the past currently bedbound, hypertension, diabetes lives at home with her son who takes care of her at home who has home health at home.  At her baseline she is bedbound.  Yesterday some labs were checked by her primary care physician.  They were called today to come to the emergency department as she may be in heart failure.  Patient has been swelling for about a week in her lower extremity.  She is chronically on 3 L of oxygen.  This is stable.  She chronically has orthopnea.  Her breathing has been at her baseline.  She has not had any fever or cough.  Patient is compliant with her medications at home.  Patient is referred from the emergency department for admission because of her chronic medical problems and the need for diuresis.  There has been no adjustment in her home diuretic dosing recently.  Review of Systems: As per HPI otherwise 10 point review of systems negative.   Past Medical History:  Diagnosis Date  . Abdominal aneurysm ([place])   . Bone spur   . Chronic back pain   . CVA (cerebral infarction)   . Diabetes mellitus without complication ([facility])   . Diastolic heart failure ([place])   . Hypertension   . Kidney stones   . Neuropathy   . Psoriasis   . Stroke ([place])   . Thiamine deficiency 08/28/2017  . Wound, open, foot     Past Surgical History:  Procedure Laterality Date  . ABDOMINAL HYSTERECTOMY    . BLADDER SUSPENSION    . VESICOVAGINAL FISTULA CLOSURE W/ TAH       reports that she has quit smoking. She has a 30.00 pack-year smoking history. she has never used smokeless tobacco. She reports that she does not drink alcohol or use drugs.  Allergies  Allergen Reactions  .  Glipizide     Severe hypoglycemia (22!)  . Penicillins Itching    Tolerated Cefepime Has patient had a PCN reaction causing immediate rash, facial/tongue/throat swelling, SOB or lightheadedness with hypotension: Yes Has patient had a PCN reaction causing severe rash involving mucus membranes or skin necrosis: No Has patient had a PCN reaction that required hospitalization: No Has patient had a PCN reaction occurring within the last 10 years: No If all of the above answers are "NO", then may proceed with Cephalosporin use.   . Clonidine Derivatives Other (See Comments)    Numbness  to tongue face, and arm  . Codeine Itching  . Hydrocodone-Acetaminophen Itching  . Plasticized Base [Plastibase]   . Shellfish Allergy Itching  . Tape   . Sulfa Antibiotics Itching and Rash    Family History  Problem Relation Age of Onset  . Aneurysm Mother   . Diabetes Mother   . Hypertension Mother   . Cancer Neg Hx   . Heart disease Neg Hx   . Stroke Neg Hx     Prior to Admission medications   Medication Sig Start Date End Date Taking? Authorizing Provider  amLODipine (NORVASC) 10 MG tablet Take 10 mg by mouth daily.   Yes [provider]  aspirin 325 MG tablet Take 1 tablet (325 mg total) by  mouth daily. 05/07/17  Yes Isaac Bliss, Rayford Halsted, MD  carvedilol (COREG) 6.25 MG tablet Take 1 tablet (6.25 mg total) by mouth 2 (two) times daily with a meal. 05/06/17  Yes Isaac Bliss, Rayford Halsted, MD  clopidogrel (PLAVIX) 75 MG tablet Take 1 tablet (75 mg total) by mouth daily. 09/05/17  Yes Regalado, Belkys A, MD  cyanocobalamin 1000 MCG tablet Take 1 tablet (1,000 mcg total) by mouth daily. 06/17/17  Yes Reyne Dumas, MD  fenofibrate 160 MG tablet Take 160 mg by mouth every morning.    Yes [provider]  fexofenadine (ALLEGRA) 180 MG tablet Take 180 mg by mouth at bedtime.    Yes [provider]  folic acid (FOLVITE) 1 MG tablet Take 1 tablet (1 mg total) by mouth daily.  06/17/17  Yes Reyne Dumas, MD  furosemide (LASIX) 40 MG tablet Take 1 tablet (40 mg total) by mouth daily. 09/05/17  Yes Regalado, Belkys A, MD  gabapentin (NEURONTIN) 100 MG capsule Take 200 mg by mouth at bedtime.    Yes [provider]  hydrALAZINE (APRESOLINE) 100 MG tablet Take 1 tablet (100 mg total) by mouth every 8 (eight) hours. 05/08/17  Yes Reyne Dumas, MD  insulin glargine (LANTUS) 100 UNIT/ML injection Inject 0.1 mLs (10 Units total) into the skin daily. Patient taking differently: Inject 10 Units into the skin at bedtime.  09/04/17  Yes Regalado, Belkys A, MD  omeprazole (PRILOSEC) 20 MG capsule Take 20 mg by mouth every morning.    Yes [provider]  potassium chloride SA (K-DUR,KLOR-CON) 20 MEQ tablet Take 2 tablets (40 mEq total) by mouth daily. Patient taking differently: Take 20 mEq by mouth 2 (two) times daily.  06/17/17  Yes Reyne Dumas, MD  simvastatin (ZOCOR) 20 MG tablet Take 1 tablet (20 mg total) by mouth daily at 6 PM. 09/04/17  Yes Regalado, Belkys A, MD  thiamine (VITAMIN B-1) 100 MG tablet Take 1 tablet (100 mg total) by mouth daily. 09/04/17  Yes Elmarie Shiley, MD    Physical Exam: Vitals:   11/05/17 1700 11/05/17 1748 11/05/17 1800 11/05/17 1830  BP: (!) 132/54 137/78 (!) 153/71 (!) 141/58  Pulse: 75 77 77 77  Resp: 17 18 15 19   Temp:      TempSrc:      SpO2: 94% 98%  97%  Weight:      Height:          Constitutional: NAD, calm, comfortable Vitals:   11/05/17 1700 11/05/17 1748 11/05/17 1800 11/05/17 1830  BP: (!) 132/54 137/78 (!) 153/71 (!) 141/58  Pulse: 75 77 77 77  Resp: 17 18 15 19   Temp:      TempSrc:      SpO2: 94% 98%  97%  Weight:      Height:       Eyes: PERRL, lids and conjunctivae normal ENMT: Mucous membranes are moist. Posterior pharynx clear of any exudate or lesions.Normal dentition.  Neck: normal, supple, no masses, no thyromegaly Respiratory: clear to auscultation bilaterally, no wheezing, no  crackles. Normal respiratory effort. No accessory muscle use.  Cardiovascular: Regular rate and rhythm, no murmurs / rubs / gallops.  2+ extremity edema. 2+ pedal pulses. No carotid bruits.  Abdomen: no tenderness, no masses palpated. No hepatosplenomegaly. Bowel sounds positive.  Musculoskeletal: no clubbing / cyanosis. No joint deformity upper and lower extremities. Good ROM, no contractures. Normal muscle tone.  Skin: no rashes, lesions, ulcers. No induration Neurologic: CN 2-12 grossly  intact. Sensation intact, DTR normal.  Right-sided weakness which is chronic Psychiatric: Normal judgment and insight. Alert and oriented x 3. Normal mood.    Labs on Admission: I have personally reviewed following labs and imaging studies  CBC: Recent Labs  Lab 11/05/17 1556  WBC 6.2  NEUTROABS 4.4  HGB 10.0*  HCT 34.4*  MCV 97.2  PLT 509   Basic Metabolic Panel: Recent Labs  Lab 11/05/17 1556  NA 143  K 4.1  CL 105  CO2 26  GLUCOSE 96  BUN 45*  CREATININE 1.69*  CALCIUM 8.8*   GFR: Estimated Creatinine Clearance: 36.2 mL/min (A) (by C-G formula based on SCr of 1.69 mg/dL (H)). Liver Function Tests: Recent Labs  Lab 11/05/17 1556  AST 30  ALT 13*  ALKPHOS 42  BILITOT 1.4*  PROT 7.1  ALBUMIN 2.7*   No results for input(s): LIPASE, AMYLASE in the last 168 hours. No results for input(s): AMMONIA in the last 168 hours. Coagulation Profile: No results for input(s): INR, PROTIME in the last 168 hours. Cardiac Enzymes: Recent Labs  Lab 11/05/17 1556  TROPONINI <0.03   BNP (last 3 results) No results for input(s): PROBNP in the last 8760 hours. HbA1C: No results for input(s): HGBA1C in the last 72 hours. CBG: No results for input(s): GLUCAP in the last 168 hours. Lipid Profile: No results for input(s): CHOL, HDL, LDLCALC, TRIG, CHOLHDL, LDLDIRECT in the last 72 hours. Thyroid Function Tests: No results for input(s): TSH, T4TOTAL, FREET4, T3FREE, THYROIDAB in the last 72  hours. Anemia Panel: No results for input(s): VITAMINB12, FOLATE, FERRITIN, TIBC, IRON, RETICCTPCT in the last 72 hours. Urine analysis:    Component Value Date/Time   COLORURINE YELLOW 11/05/2017 1650   APPEARANCEUR HAZY (A) 11/05/2017 1650   LABSPEC 1.010 11/05/2017 1650   PHURINE 5.0 11/05/2017 1650   GLUCOSEU NEGATIVE 11/05/2017 1650   HGBUR SMALL (A) 11/05/2017 1650   BILIRUBINUR NEGATIVE 11/05/2017 1650   KETONESUR NEGATIVE 11/05/2017 1650   PROTEINUR 30 (A) 11/05/2017 1650   UROBILINOGEN 0.2 08/03/2013 0936   NITRITE POSITIVE (A) 11/05/2017 1650   LEUKOCYTESUR LARGE (A) 11/05/2017 1650   Sepsis Labs: !!!!!!!!!!!!!!!!!!!!!!!!!!!!!!!!!!!!!!!!!!!! @LABRCNTIP (procalcitonin:4,lacticidven:4) )No results found for this or any previous visit (from the past 240 hour(s)).   Radiological Exams on Admission: Dg Chest Port 1 View  Result Date: 11/05/2017 CLINICAL DATA:  CHF EXAM: PORTABLE CHEST 1 VIEW COMPARISON:  08/30/2017 FINDINGS: Stable cardiomegaly with aortic atherosclerosis. Interval increase in pulmonary vascular redistribution and edema with obscuration of the left hemidiaphragm likely from layering effusion with atelectasis. There is cephalization of the pulmonary vessels. Trace fissural fluid noted on the right. No acute osseous abnormality. IMPRESSION: Slight worsening of CHF. Stable cardiomegaly. Aortic atherosclerosis. Bilateral left greater than right pleural effusions. Electronically Signed   By: Ashley Royalty M.D.   On: 11/05/2017 16:14    EKG: Independently reviewed.  Normal sinus rhythm T wave inversion V1 through V5 Old chart reviewed Case discussed with EDP Dr. Jeanell Sparrow Chest x-ray reviewed pulmonary edema noted with bilateral pleural effusions  Assessment/Plan 67 year old female with acute on chronic diastolic congestive heart failure Principal Problem:   CHF exacerbation (HCC)-increase her Lasix to 40 mg IV every 12 hours.  She is on Lasix 40 mg p.o. daily at home.   She had a echocardiogram done in 88 of 18 with a EF of 60% which showed diastolic dysfunction.  We will not repeat at this time.  Patient's oxygen saturations are at her baseline needs.  Obtain accurate I's and O's and daily weights.  Monitor renal function closely while increasing her diuresis.  Active Problems:   Chronic back pain-stable continue home meds   Psoriasis-stable   Cerebrovascular accident (CVA) due to bilateral stenosis of anterior cerebral arteries ([place])  Old-noted   DM type 2 (diabetes mellitus, type 2) (HCC)-sliding scale insulin   Chronic respiratory failure with hypoxia (HCC)-continue 3 L of oxygen   Essential hypertension-stable   UTI (urinary tract infection)-placed on Rocephin 1 g IV every 24 hours and obtain urine culture   CKD (chronic kidney disease) stage 4, GFR 15-29 ml/min (HCC)-this is stable at her baseline of creatinine 1.6   Right hemiparesis (HCC) old-noted   Candidal skin infection-given oral dose of Diflucan 150 mg orally and nystatin cream to area 2-3 times a day     DVT prophylaxis: SCDs Code Status: Full Family Communication: Son Disposition Plan: Per day team Consults called: None Admission status: Admission   Johnny Gorter A MD [facility]  If 7PM-7AM, please contact night-coverage [email or web address] Password [facility]  11/05/2017, 7:45 PM

## 2017-11-06 DIAGNOSIS — I509 Heart failure, unspecified: Secondary | ICD-10-CM

## 2017-11-06 LAB — GLUCOSE, CAPILLARY
GLUCOSE-CAPILLARY: 127 mg/dL — AB (ref 65–99)
Glucose-Capillary: 89 mg/dL (ref 65–99)
Glucose-Capillary: 113 mg/dL — ABNORMAL HIGH (ref 65–99)
Glucose-Capillary: 145 mg/dL — ABNORMAL HIGH (ref 65–99)

## 2017-11-06 LAB — BASIC METABOLIC PANEL
Anion gap: 11 (ref 5–15)
BUN: 43 mg/dL — ABNORMAL HIGH (ref 6–20)
CALCIUM: 8.3 mg/dL — AB (ref 8.9–10.3)
CHLORIDE: 101 mmol/L (ref 101–111)
CO2: 27 mmol/L (ref 22–32)
CREATININE: 1.67 mg/dL — AB (ref 0.44–1.00)
GFR, EST AFRICAN AMERICAN: 36 mL/min — AB (ref >60)
GFR, EST NON AFRICAN AMERICAN: 31 mL/min — AB (ref >60)
Glucose, Bld: 98 mg/dL (ref 65–99)
Potassium: 3.6 mmol/L (ref 3.5–5.1)
Sodium: 139 mmol/L (ref 135–145)

## 2017-11-06 MED ORDER — FUROSEMIDE 10 MG/ML IJ SOLN
60.0000 mg | Freq: Two times a day (BID) | INTRAMUSCULAR | Status: DC
  Administered 2017-11-06 – 2017-11-10 (×8): 60 mg via INTRAVENOUS
  Filled 2017-11-06 (×8): qty 6

## 2017-11-06 NOTE — Plan of Care (Signed)
  Education: Ability to verbalize understanding of medication therapies will improve 11/06/2017 1023 - Progressing by Anacleto Batterman, Leitha Schuller, RN  Patient verbalizes understanding that we are administering lasix for CHF in attempt to get rid of fluid. Questions if she has improved - explained that she has good output but still has some swelling to legs and abdomen and that we will continue to monitor

## 2017-11-06 NOTE — Progress Notes (Signed)
Patient has several skin issues.  Redness and MASD noted under breasts, in crease of thighs and peri area and sacrum.  Nystatin cream ordered and applied to areas.  Pure wick in place to help with moisture. unstagable wounds to L hip and heels.  Stage 2 also beneath L hip wound  Foam dressings applied.  Patient is strict I & O's but had 2 incontinent episodes that were unable to be measured when being bathed and repositioned.  Pure wick equipment changed.

## 2017-11-06 NOTE — Progress Notes (Signed)
PROGRESS NOTE    Madison Reed  IRJ:[id number] DOB: 1951/08/13 DOA: 11/05/2017 PCP: Celene Squibb, MD     Brief Narrative:  67 year old woman admitted from home on 1/25 due to "CHF".  Patient was visited by home health nurse told her she needed to be in the hospital due to acute CHF.   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 -She is still markedly volume overloaded on exam. -Per documented intake and output she is -900 cc since admission. -Given minimal diuresis and significant volume overload 40-60 mg IV twice daily. -Not on ACE inhibitor/ARB due to renal dysfunction. -Continue Coreg.  Hyperlipidemia -Continue statin  Stage III-IV chronic kidney disease -Creatinine remains at baseline around 1.6 despite diuresis.  Type 2 diabetes -Well controlled  Hypertension -Fair control. -Continue current management.  UTI -Continue Rocephin pending culture data   DVT prophylaxis: Lovenox Code Status: Full Code Family Communication: Patient only Disposition Plan: pending medical improvement  Consultants:   None  Procedures:   None  Antimicrobials:  Anti-infectives (From admission, onward)   Start     Dose/Rate Route Frequency Ordered Stop   11/06/17 2000  cefTRIAXone (ROCEPHIN) 1 g in dextrose 5 % 50 mL IVPB     1 g 100 mL/hr over 30 Minutes Intravenous Every 24 hours 11/05/17 1939     11/05/17 2200  fluconazole  (DIFLUCAN) tablet 150 mg     150 mg Oral  Once 11/05/17 1939 11/05/17 2235   11/05/17 1815  cefTRIAXone (ROCEPHIN) 1 g in dextrose 5 % 50 mL IVPB     1 g 100 mL/hr over 30 Minutes Intravenous  Once 11/05/17 1812 11/05/17 2012       Subjective: Feels improved, less SOB, weak. No CP.  Objective: Vitals:   11/05/17 2104 11/05/17 2141 11/06/17 0631 11/06/17 1404  BP:  (!) 167/95 (!) 148/52 (!) 143/51  Pulse:  79 73 74  Resp:  18 18 18   Temp:  98 F (36.7 C) 98.4 F (36.9 C) 97.9 F (36.6 C)  TempSrc:  Oral Oral Oral  SpO2: 95% 98% 98% 96%  Weight:   89.6 kg (197 lb 8.5 oz)   Height:        Intake/Output Summary (Last 24 hours) at 11/06/2017 1731 Last data filed at 11/06/2017 0900 Gross per 24 hour  Intake 120 ml  Output 900 ml  Net -780 ml   Filed Weights   11/05/17 1552 11/06/17 0631  Weight: 81.6 kg (180 lb) 89.6 kg (197 lb 8.5 oz)    Examination:   General exam: Alert, awake, oriented x 3 Respiratory system: Clear to auscultation. Respiratory effort normal. Cardiovascular system:RRR. No murmurs, rubs, gallops. Gastrointestinal system: Abdomen is nondistended, soft and nontender. No organomegaly or masses felt. Normal bowel sounds heard. Central nervous system: Alert and oriented. No focal neurological deficits. Extremities: 3++ pitting edema bilaterally. Skin: No rashes, lesions or ulcers  Psychiatry: Judgement and insight appear normal. Mood & affect appropriate.     Data Reviewed: I have personally reviewed following labs and imaging studies  CBC: Recent Labs  Lab 11/05/17 1556  WBC 6.2  NEUTROABS 4.4  HGB 10.0*  HCT 34.4*  MCV 97.2  PLT 426   Basic Metabolic Panel: Recent Labs  Lab 11/05/17 1556 11/06/17 0724  NA 143 139  K 4.1 3.6  CL 105 101  CO2 26 27  GLUCOSE 96 98  BUN 45* 43*  CREATININE 1.69* 1.67*  CALCIUM 8.8* 8.3*   GFR: Estimated Creatinine Clearance: 38.4 mL/min (A) (by C-G formula based on SCr of 1.67 mg/dL (H)). Liver  Function Tests: Recent Labs  Lab 11/05/17 1556  AST 30  ALT 13*  ALKPHOS 42  BILITOT 1.4*  PROT 7.1  ALBUMIN 2.7*   No results for input(s): LIPASE, AMYLASE in the last 168 hours. No results for input(s): AMMONIA in the last 168 hours. Coagulation Profile: No results for input(s): INR, PROTIME in the last 168 hours. Cardiac Enzymes: Recent Labs  Lab 11/05/17 1556  TROPONINI <0.03   BNP (last 3 results) No results for input(s): PROBNP in the last 8760 hours. HbA1C: No results for input(s): HGBA1C in the last 72 hours. CBG: Recent Labs  Lab 11/05/17 2139 11/06/17 0745 11/06/17 1137 11/06/17 1645  GLUCAP 108* 89 113* 145*   Lipid Profile: No results for input(s): CHOL, HDL, LDLCALC, TRIG, CHOLHDL, LDLDIRECT in the last 72 hours. Thyroid Function Tests: No results for input(s): TSH, T4TOTAL, FREET4, T3FREE, THYROIDAB in the last 72 hours. Anemia Panel: No results for input(s): VITAMINB12, FOLATE, FERRITIN, TIBC, IRON, RETICCTPCT in the last 72 hours. Urine analysis:    Component Value Date/Time   COLORURINE YELLOW 11/05/2017 1650   APPEARANCEUR HAZY (A) 11/05/2017 1650   LABSPEC 1.010 11/05/2017 1650   PHURINE 5.0 11/05/2017 1650   GLUCOSEU NEGATIVE 11/05/2017 1650   HGBUR SMALL (A) 11/05/2017 1650   BILIRUBINUR NEGATIVE 11/05/2017 1650   KETONESUR NEGATIVE 11/05/2017 1650   PROTEINUR 30 (A) 11/05/2017 1650   UROBILINOGEN 0.2 08/03/2013 0936   NITRITE POSITIVE (A) 11/05/2017 1650   LEUKOCYTESUR LARGE (A) 11/05/2017 1650   Sepsis Labs: @LABRCNTIP (procalcitonin:4,lacticidven:4)  )No results found for this or any previous visit (from the past 240 hour(s)).       Radiology Studies: Dg Chest Port 1 View  Result Date: 11/05/2017 CLINICAL DATA:  CHF EXAM: PORTABLE CHEST 1 VIEW COMPARISON:  08/30/2017 FINDINGS: Stable cardiomegaly with aortic atherosclerosis. Interval increase in pulmonary vascular redistribution and edema with obscuration of the left  hemidiaphragm likely from layering effusion with atelectasis. There is cephalization of the pulmonary vessels. Trace fissural fluid noted on the right. No acute osseous abnormality. IMPRESSION: Slight worsening of CHF. Stable cardiomegaly. Aortic atherosclerosis. Bilateral left greater than right pleural effusions. Electronically Signed   By: Ashley Royalty M.D.   On: 11/05/2017 16:14        Scheduled Meds: . amLODipine  10 mg Oral Daily  . aspirin  325 mg Oral Daily  . carvedilol  6.25 mg Oral BID WC  . clopidogrel  75 mg Oral Daily  . enoxaparin (LOVENOX) injection  40 mg Subcutaneous Q24H  . fenofibrate  160 mg Oral q morning - 83M  . folic acid  1 mg Oral Daily  . furosemide  40 mg Intravenous Q12H  . gabapentin  200 mg Oral QHS  . hydrALAZINE  100 mg Oral Q8H  . insulin aspart  0-9 Units  Subcutaneous TID WC  . insulin glargine  10 Units Subcutaneous QHS  . nystatin cream   Topical TID  . potassium chloride SA  40 mEq Oral Daily  . simvastatin  20 mg Oral q1800  . sodium chloride flush  3 mL Intravenous Q12H  . thiamine  100 mg Oral Daily  . cyanocobalamin  1,000 mcg Oral Daily   Continuous Infusions: . sodium chloride    . cefTRIAXone (ROCEPHIN)  IV       LOS: 1 day    Time spent: 25 minutes. Greater than 50% of this time was spent in direct contact with the patient coordinating care.     Lelon Frohlich, MD [facility] Pager [phone]  If 7PM-7AM, please contact night-coverage [email or web address] Password [facility] 11/06/2017, 5:31 PM

## 2017-11-07 LAB — BASIC METABOLIC PANEL
Anion gap: 10 (ref 5–15)
BUN: 43 mg/dL — AB (ref 6–20)
CALCIUM: 8.5 mg/dL — AB (ref 8.9–10.3)
CHLORIDE: 103 mmol/L (ref 101–111)
CO2: 28 mmol/L (ref 22–32)
CREATININE: 1.71 mg/dL — AB (ref 0.44–1.00)
GFR calc non Af Amer: 30 mL/min — ABNORMAL LOW (ref >60)
GFR, EST AFRICAN AMERICAN: 35 mL/min — AB (ref >60)
Glucose, Bld: 87 mg/dL (ref 65–99)
Potassium: 3.8 mmol/L (ref 3.5–5.1)
Sodium: 141 mmol/L (ref 135–145)

## 2017-11-07 LAB — GLUCOSE, CAPILLARY
Glucose-Capillary: 109 mg/dL — ABNORMAL HIGH (ref 65–99)
Glucose-Capillary: 140 mg/dL — ABNORMAL HIGH (ref 65–99)
Glucose-Capillary: 108 mg/dL — ABNORMAL HIGH (ref 65–99)
Glucose-Capillary: 215 mg/dL — ABNORMAL HIGH (ref 65–99)

## 2017-11-07 NOTE — Progress Notes (Signed)
PROGRESS NOTE    Madison Reed  VQM:[id number] DOB: 02-May-1951 DOA: 11/05/2017 PCP: Celene Squibb, MD     Brief Narrative:  67 year old woman admitted from home on 1/25 due to "CHF".  Patient was visited by home health nurse who told her she needed to be in the hospital due to acute CHF.   Assessment & Plan:   Principal Problem:   CHF exacerbation ([place]) Active Problems:   Chronic back pain   Psoriasis   Cerebrovascular accident (CVA) due to bilateral stenosis of anterior cerebral arteries [facility])  old   Dyspnea   DM type 2 (diabetes mellitus, type 2) (HCC)   Chronic respiratory failure with hypoxia (HCC)   Essential hypertension   UTI (urinary tract infection)   CKD (chronic kidney disease) stage 4, GFR 15-29 ml/min (HCC)   Right hemiparesis (HCC) old   Candidal skin infection   Acute on chronic diastolic heart failure -Echo in November 2018 showed an ejection fraction of 60%. -She is still markedly volume overloaded on exam, altho improved since yesterday. -Per documented intake and output she is -1650 cc since admission. -Continue lasix at current dose of 60 mg IV BID for now, follow renal function closely. -Not on ACE inhibitor/ARB due to renal dysfunction. -Continue Coreg.  Hyperlipidemia -Continue statin  Stage III-IV chronic kidney disease -Creatinine remains at baseline around 1.6 despite diuresis.  Type 2 diabetes -Well controlled  Hypertension -Fair control. -Continue current management.  UTI -Continue Rocephin pending culture data -Cx so far with >100,000 GNR.   DVT prophylaxis: Lovenox Code Status: Full Code Family Communication: Mother and brother at bedside updated on plan of care and all questions answered Disposition Plan: pending medical improvement  Consultants:   None  Procedures:   None  Antimicrobials:  Anti-infectives (From admission, onward)   Start     Dose/Rate Route Frequency Ordered Stop   11/06/17 2000  cefTRIAXone  (ROCEPHIN) 1 g in dextrose 5 % 50 mL IVPB     1 g 100 mL/hr over 30 Minutes Intravenous Every 24 hours 11/05/17 1939     11/05/17 2200  fluconazole (DIFLUCAN) tablet 150 mg     150 mg Oral  Once 11/05/17 1939 11/05/17 2235   11/05/17 1815  cefTRIAXone (ROCEPHIN) 1 g in dextrose 5 % 50 mL IVPB     1 g 100 mL/hr over 30 Minutes Intravenous  Once 11/05/17 1812 11/05/17 2012       Subjective: Feels improved although still weak, much less short of breath, denies chest pain  Objective: Vitals:   11/06/17 2111 11/06/17 2120 11/07/17 0520 11/07/17 1325  BP:  (!) 143/55 (!) 145/69 (!) 122/44  Pulse:  67 66 67  Resp:  18 18 18   Temp:  98.3 F (36.8 C) 98.2 F (36.8 C) 98.4 F (36.9 C)  TempSrc:  Oral Oral Oral  SpO2: 91% 100% 100% 96%  Weight:   92 kg (202 lb 13.2 oz)   Height:        Intake/Output Summary (Last 24 hours) at 11/07/2017 1647 Last data filed at 11/07/2017 0900 Gross per 24 hour  Intake 530 ml  Output 1400 ml  Net -870 ml   Filed Weights   11/05/17 1552 11/06/17 0631 11/07/17 0520  Weight: 81.6 kg (180 lb) 89.6 kg (197 lb 8.5 oz) 92 kg (202 lb 13.2 oz)    Examination:   General exam: Alert, awake, oriented x 3 Respiratory system: Clear to auscultation. Respiratory effort normal. Cardiovascular system:RRR. No  murmurs, rubs, gallops. Gastrointestinal system: Abdomen is nondistended, soft and nontender. No organomegaly or masses felt. Normal bowel sounds heard. Central nervous system: Alert and oriented. No focal neurological deficits. Extremities: 2++ pitting edema bilaterally. Skin: No rashes, lesions or ulcers Psychiatry: Judgement and insight appear normal. Mood & affect appropriate.     Data Reviewed: I have personally reviewed following labs and imaging studies  CBC: Recent Labs  Lab 11/05/17 1556  WBC 6.2  NEUTROABS 4.4  HGB 10.0*  HCT 34.4*  MCV 97.2  PLT 109   Basic Metabolic Panel: Recent Labs  Lab 11/05/17 1556 11/06/17 0724  11/07/17 0445  NA 143 139 141  K 4.1 3.6 3.8  CL 105 101 103  CO2 26 27 28   GLUCOSE 96 98 87  BUN 45* 43* 43*  CREATININE 1.69* 1.67* 1.71*  CALCIUM 8.8* 8.3* 8.5*   GFR: Estimated Creatinine Clearance: 38 mL/min (A) (by C-G formula based on SCr of 1.71 mg/dL (H)). Liver Function Tests: Recent Labs  Lab 11/05/17 1556  AST 30  ALT 13*  ALKPHOS 42  BILITOT 1.4*  PROT 7.1  ALBUMIN 2.7*   No results for input(s): LIPASE, AMYLASE in the last 168 hours. No results for input(s): AMMONIA in the last 168 hours. Coagulation Profile: No results for input(s): INR, PROTIME in the last 168 hours. Cardiac Enzymes: Recent Labs  Lab 11/05/17 1556  TROPONINI <0.03   BNP (last 3 results) No results for input(s): PROBNP in the last 8760 hours. HbA1C: No results for input(s): HGBA1C in the last 72 hours. CBG: Recent Labs  Lab 11/06/17 1137 11/06/17 1645 11/06/17 2118 11/07/17 0747 11/07/17 1111  GLUCAP 113* 145* 127* 109* 140*   Lipid Profile: No results for input(s): CHOL, HDL, LDLCALC, TRIG, CHOLHDL, LDLDIRECT in the last 72 hours. Thyroid Function Tests: No results for input(s): TSH, T4TOTAL, FREET4, T3FREE, THYROIDAB in the last 72 hours. Anemia Panel: No results for input(s): VITAMINB12, FOLATE, FERRITIN, TIBC, IRON, RETICCTPCT in the last 72 hours. Urine analysis:    Component Value Date/Time   COLORURINE YELLOW 11/05/2017 1650   APPEARANCEUR HAZY (A) 11/05/2017 1650   LABSPEC 1.010 11/05/2017 1650   PHURINE 5.0 11/05/2017 1650   GLUCOSEU NEGATIVE 11/05/2017 1650   HGBUR SMALL (A) 11/05/2017 1650   BILIRUBINUR NEGATIVE 11/05/2017 1650   KETONESUR NEGATIVE 11/05/2017 1650   PROTEINUR 30 (A) 11/05/2017 1650   UROBILINOGEN 0.2 08/03/2013 0936   NITRITE POSITIVE (A) 11/05/2017 1650   LEUKOCYTESUR LARGE (A) 11/05/2017 1650   Sepsis Labs: @LABRCNTIP (procalcitonin:4,lacticidven:4)  ) Recent Results (from the past 240 hour(s))  Urine Culture     Status: Abnormal  (Preliminary result)   Collection Time: 11/05/17  4:47 PM  Result Value Ref Range Status   Specimen Description URINE, CATHETERIZED  Final   Special Requests NONE  Final   Culture (A)  Final    >=100,000 COLONIES/mL GRAM NEGATIVE RODS IDENTIFICATION AND SUSCEPTIBILITIES TO FOLLOW Performed at [facility], [place] [address] [place], [place] [address]    Report Status PENDING  Incomplete         Radiology Studies: No results found.      Scheduled Meds: . amLODipine  10 mg Oral Daily  . aspirin  325 mg Oral Daily  . carvedilol  6.25 mg Oral BID WC  . clopidogrel  75 mg Oral Daily  . enoxaparin (LOVENOX) injection  40 mg Subcutaneous Q24H  . fenofibrate  160 mg Oral q morning - 73U  . folic acid  1 mg Oral Daily  . furosemide  60 mg Intravenous Q12H  . gabapentin  200 mg Oral QHS  . hydrALAZINE  100 mg Oral Q8H  . insulin aspart  0-9 Units Subcutaneous TID WC  . insulin glargine  10 Units Subcutaneous QHS  . nystatin cream   Topical TID  . potassium chloride SA  40 mEq Oral Daily  . simvastatin  20 mg Oral q1800  . sodium chloride flush  3 mL Intravenous Q12H  . thiamine  100 mg Oral Daily  . cyanocobalamin  1,000 mcg Oral Daily   Continuous Infusions: . sodium chloride    . cefTRIAXone (ROCEPHIN)  IV Stopped (11/06/17 2049)     LOS: 2 days    Time spent: 25 minutes. Greater than 50% of this time was spent in direct contact with the patient coordinating care.     Lelon Frohlich, MD [facility] Pager [phone]  If 7PM-7AM, please contact night-coverage [email or web address] Password [facility] 11/07/2017, 4:47 PM

## 2017-11-08 LAB — BASIC METABOLIC PANEL
ANION GAP: 10 (ref 5–15)
BUN: 40 mg/dL — ABNORMAL HIGH (ref 6–20)
CALCIUM: 8.2 mg/dL — AB (ref 8.9–10.3)
CO2: 29 mmol/L (ref 22–32)
Chloride: 99 mmol/L — ABNORMAL LOW (ref 101–111)
Creatinine, Ser: 1.52 mg/dL — ABNORMAL HIGH (ref 0.44–1.00)
GFR, EST AFRICAN AMERICAN: 41 mL/min — AB (ref >60)
GFR, EST NON AFRICAN AMERICAN: 35 mL/min — AB (ref >60)
GLUCOSE: 86 mg/dL (ref 65–99)
Potassium: 3.3 mmol/L — ABNORMAL LOW (ref 3.5–5.1)
SODIUM: 138 mmol/L (ref 135–145)

## 2017-11-08 LAB — URINE CULTURE

## 2017-11-08 LAB — GLUCOSE, CAPILLARY
GLUCOSE-CAPILLARY: 80 mg/dL (ref 65–99)
GLUCOSE-CAPILLARY: 113 mg/dL — AB (ref 65–99)
Glucose-Capillary: 76 mg/dL (ref 65–99)
Glucose-Capillary: 108 mg/dL — ABNORMAL HIGH (ref 65–99)

## 2017-11-08 MED ORDER — CIPROFLOXACIN HCL 250 MG PO TABS
250.0000 mg | ORAL_TABLET | Freq: Two times a day (BID) | ORAL | Status: DC
  Administered 2017-11-08 – 2017-11-10 (×4): 250 mg via ORAL
  Filled 2017-11-08 (×4): qty 1

## 2017-11-08 MED ORDER — POTASSIUM CHLORIDE CRYS ER 20 MEQ PO TBCR
40.0000 meq | EXTENDED_RELEASE_TABLET | Freq: Once | ORAL | Status: AC
  Administered 2017-11-08: 40 meq via ORAL
  Filled 2017-11-08: qty 2

## 2017-11-08 NOTE — Plan of Care (Signed)
  Education: Ability to demonstrate management of disease process will improve 11/08/2017 0921 - Progressing by Berton Bon, RN   Education: Ability to verbalize understanding of medication therapies will improve 11/08/2017 0921 - Progressing by Berton Bon, RN   Activity: Capacity to carry out activities will improve 11/08/2017 0921 - Progressing by Berton Bon, RN

## 2017-11-08 NOTE — Progress Notes (Signed)
PROGRESS NOTE    Madison Reed  [id number]:[id number] DOB: 1951/08/24 DOA: 11/05/2017 PCP: Celene Squibb, MD     Brief Narrative:  67 year old woman admitted from home on 1/25 due to "CHF".  Patient was visited by home health nurse who told her she needed to be in the hospital due to acute CHF.   Assessment & Plan:   Principal Problem:   CHF exacerbation ([place]) Active Problems:   Chronic back pain   Psoriasis   Cerebrovascular accident (CVA) due to bilateral stenosis of anterior cerebral arteries [facility])  old   Dyspnea   DM type 2 (diabetes mellitus, type 2) (HCC)   Chronic respiratory failure with hypoxia (HCC)   Essential hypertension   UTI (urinary tract infection)   CKD (chronic kidney disease) stage 4, GFR 15-29 ml/min (HCC)   Right hemiparesis (HCC) old   Candidal skin infection   Acute on chronic diastolic heart failure -Echo in November 2018 showed an ejection fraction of 60%. -She is still markedly volume overloaded on exam, altho improved since yesterday. -Per documented intake and output she is -2000 cc since admission. -Continue lasix at current dose of 60 mg IV BID for now, follow renal function closely. -Not on ACE inhibitor/ARB due to renal dysfunction. -Continue Coreg.  Hyperlipidemia -Continue statin  Stage III-IV chronic kidney disease -Creatinine remains at baseline around 1.6 despite diuresis. -1/28 a little better than baseline.  Type 2 diabetes -Well controlled  Hypertension -Fair control. -Continue current management.  UTI -Cx so far with >100,000 Citrobacter resistant to rocephin. -Will transition over to cipro for 5 days.   DVT prophylaxis: Lovenox Code Status: Full Code Family Communication: family friend at bedside updated on plan of care and all questions answered Disposition Plan: pending medical improvement  Consultants:   None  Procedures:   None  Antimicrobials:  Anti-infectives (From admission, onward)   Start     Dose/Rate  Route Frequency Ordered Stop   11/06/17 2000  cefTRIAXone (ROCEPHIN) 1 g in dextrose 5 % 50 mL IVPB     1 g 100 mL/hr over 30 Minutes Intravenous Every 24 hours 11/05/17 1939     11/05/17 2200  fluconazole (DIFLUCAN) tablet 150 mg     150 mg Oral  Once 11/05/17 1939 11/05/17 2235   11/05/17 1815  cefTRIAXone (ROCEPHIN) 1 g in dextrose 5 % 50 mL IVPB     1 g 100 mL/hr over 30 Minutes Intravenous  Once 11/05/17 1812 11/05/17 2012       Subjective: Very weak, denies CP, no SOB.  Objective: Vitals:   11/08/17 0435 11/08/17 0850 11/08/17 1400 11/08/17 1805  BP: 128/64 (!) 153/52 (!) 131/51 (!) 143/54  Pulse: 68 69 (!) 58   Resp: 18 18 20    Temp: 98.4 F (36.9 C)  98.2 F (36.8 C)   TempSrc: Oral  Oral   SpO2: 95%  95%   Weight: 89.4 kg (197 lb 1.5 oz)     Height:        Intake/Output Summary (Last 24 hours) at 11/08/2017 2057 Last data filed at 11/08/2017 1958 Gross per 24 hour  Intake 1013 ml  Output 1000 ml  Net 13 ml   Filed Weights   11/06/17 0631 11/07/17 0520 11/08/17 0435  Weight: 89.6 kg (197 lb 8.5 oz) 92 kg (202 lb 13.2 oz) 89.4 kg (197 lb 1.5 oz)    Examination:   General exam: Alert, awake, oriented x 3 Respiratory system: Clear to auscultation. Respiratory effort  normal. Cardiovascular system:RRR. No murmurs, rubs, gallops. Gastrointestinal system: Abdomen is nondistended, soft and nontender. No organomegaly or masses felt. Normal bowel sounds heard. Central nervous system: Alert and oriented. No focal neurological deficits. Extremities: 3++ pitting edema bilaterally. Skin: No rashes, lesions or ulcers Psychiatry: Judgement and insight appear normal. Mood & affect appropriate.     Data Reviewed: I have personally reviewed following labs and imaging studies  CBC: Recent Labs  Lab 11/05/17 1556  WBC 6.2  NEUTROABS 4.4  HGB 10.0*  HCT 34.4*  MCV 97.2  PLT 811   Basic Metabolic Panel: Recent Labs  Lab 11/05/17 1556 11/06/17 0724  11/07/17 0445 11/08/17 0527  NA 143 139 141 138  K 4.1 3.6 3.8 3.3*  CL 105 101 103 99*  CO2 26 27 28 29   GLUCOSE 96 98 87 86  BUN 45* 43* 43* 40*  CREATININE 1.69* 1.67* 1.71* 1.52*  CALCIUM 8.8* 8.3* 8.5* 8.2*   GFR: Estimated Creatinine Clearance: 42.1 mL/min (A) (by C-G formula based on SCr of 1.52 mg/dL (H)). Liver Function Tests: Recent Labs  Lab 11/05/17 1556  AST 30  ALT 13*  ALKPHOS 42  BILITOT 1.4*  PROT 7.1  ALBUMIN 2.7*   No results for input(s): LIPASE, AMYLASE in the last 168 hours. No results for input(s): AMMONIA in the last 168 hours. Coagulation Profile: No results for input(s): INR, PROTIME in the last 168 hours. Cardiac Enzymes: Recent Labs  Lab 11/05/17 1556  TROPONINI <0.03   BNP (last 3 results) No results for input(s): PROBNP in the last 8760 hours. HbA1C: No results for input(s): HGBA1C in the last 72 hours. CBG: Recent Labs  Lab 11/07/17 1644 11/07/17 2003 11/08/17 0714 11/08/17 1113 11/08/17 1600  GLUCAP 108* 215* 80 76 113*   Lipid Profile: No results for input(s): CHOL, HDL, LDLCALC, TRIG, CHOLHDL, LDLDIRECT in the last 72 hours. Thyroid Function Tests: No results for input(s): TSH, T4TOTAL, FREET4, T3FREE, THYROIDAB in the last 72 hours. Anemia Panel: No results for input(s): VITAMINB12, FOLATE, FERRITIN, TIBC, IRON, RETICCTPCT in the last 72 hours. Urine analysis:    Component Value Date/Time   COLORURINE YELLOW 11/05/2017 1650   APPEARANCEUR HAZY (A) 11/05/2017 1650   LABSPEC 1.010 11/05/2017 1650   PHURINE 5.0 11/05/2017 1650   GLUCOSEU NEGATIVE 11/05/2017 1650   HGBUR SMALL (A) 11/05/2017 1650   BILIRUBINUR NEGATIVE 11/05/2017 1650   KETONESUR NEGATIVE 11/05/2017 1650   PROTEINUR 30 (A) 11/05/2017 1650   UROBILINOGEN 0.2 08/03/2013 0936   NITRITE POSITIVE (A) 11/05/2017 1650   LEUKOCYTESUR LARGE (A) 11/05/2017 1650   Sepsis Labs: @LABRCNTIP (procalcitonin:4,lacticidven:4)  ) Recent Results (from the past 240  hour(s))  Urine Culture     Status: Abnormal   Collection Time: 11/05/17  4:47 PM  Result Value Ref Range Status   Specimen Description URINE, CATHETERIZED  Final   Special Requests NONE  Final   Culture >=100,000 COLONIES/mL CITROBACTER BRAAKII (A)  Final   Report Status 11/08/2017 FINAL  Final   Organism ID, Bacteria CITROBACTER BRAAKII (A)  Final      Susceptibility   Citrobacter braakii - MIC*    CEFAZOLIN >=64 RESISTANT Resistant     CEFTRIAXONE >=64 RESISTANT Resistant     CIPROFLOXACIN 1 SENSITIVE Sensitive     GENTAMICIN >=16 RESISTANT Resistant     IMIPENEM 1 SENSITIVE Sensitive     NITROFURANTOIN 32 SENSITIVE Sensitive     TRIMETH/SULFA <=20 SENSITIVE Sensitive     PIP/TAZO 16 SENSITIVE Sensitive     * >=  100,000 [place]         Radiology Studies: No results found.      Scheduled Meds: . amLODipine  10 mg Oral Daily  . aspirin  325 mg Oral Daily  . carvedilol  6.25 mg Oral BID WC  . clopidogrel  75 mg Oral Daily  . enoxaparin (LOVENOX) injection  40 mg Subcutaneous Q24H  . fenofibrate  160 mg Oral q morning - 54Y  . folic acid  1 mg Oral Daily  . furosemide  60 mg Intravenous Q12H  . gabapentin  200 mg Oral QHS  . hydrALAZINE  100 mg Oral Q8H  . insulin aspart  0-9 Units Subcutaneous TID WC  . insulin glargine  10 Units Subcutaneous QHS  . nystatin cream   Topical TID  . potassium chloride SA  40 mEq Oral Daily  . simvastatin  20 mg Oral q1800  . sodium chloride flush  3 mL Intravenous Q12H  . thiamine  100 mg Oral Daily  . cyanocobalamin  1,000 mcg Oral Daily   Continuous Infusions: . sodium chloride    . cefTRIAXone (ROCEPHIN)  IV Stopped (11/07/17 2157)     LOS: 3 days    Time spent: 25 minutes. Greater than 50% of this time was spent in direct contact with the patient coordinating care.     Lelon Frohlich, MD [facility] Pager [phone]  If 7PM-7AM, please contact  night-coverage [email or web address] Password [facility] 11/08/2017, 8:57 PM

## 2017-11-09 LAB — BASIC METABOLIC PANEL
ANION GAP: 9 (ref 5–15)
BUN: 38 mg/dL — ABNORMAL HIGH (ref 6–20)
CO2: 29 mmol/L (ref 22–32)
Calcium: 8.3 mg/dL — ABNORMAL LOW (ref 8.9–10.3)
Chloride: 101 mmol/L (ref 101–111)
Creatinine, Ser: 1.53 mg/dL — ABNORMAL HIGH (ref 0.44–1.00)
GFR calc Af Amer: 40 mL/min — ABNORMAL LOW (ref >60)
GFR, EST NON AFRICAN AMERICAN: 35 mL/min — AB (ref >60)
GLUCOSE: 93 mg/dL (ref 65–99)
POTASSIUM: 3.8 mmol/L (ref 3.5–5.1)
Sodium: 139 mmol/L (ref 135–145)

## 2017-11-09 LAB — GLUCOSE, CAPILLARY
GLUCOSE-CAPILLARY: 147 mg/dL — AB (ref 65–99)
Glucose-Capillary: 118 mg/dL — ABNORMAL HIGH (ref 65–99)
Glucose-Capillary: 104 mg/dL — ABNORMAL HIGH (ref 65–99)

## 2017-11-09 MED ORDER — COLLAGENASE 250 UNIT/GM EX OINT
TOPICAL_OINTMENT | Freq: Every day | CUTANEOUS | Status: DC
  Administered 2017-11-09 – 2017-11-10 (×2): via TOPICAL
  Filled 2017-11-09: qty 30

## 2017-11-09 MED ORDER — MAGNESIUM HYDROXIDE 400 MG/5ML PO SUSP
30.0000 mL | Freq: Every day | ORAL | Status: DC | PRN
  Administered 2017-11-09: 30 mL via ORAL
  Filled 2017-11-09: qty 30

## 2017-11-09 NOTE — Progress Notes (Signed)
PROGRESS NOTE    Madison Reed  ZOX:[id number] DOB: Apr 05, 1951 DOA: 11/05/2017 PCP: Celene Squibb, MD     Brief Narrative:  67 year old woman admitted from home on 1/25 due to "CHF".  Patient was visited by home health nurse who told her she needed to be in the hospital due to acute CHF.   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 -She is still markedly volume overloaded on exam, altho improved since yesterday. -Per documented intake and output she is 2.5 L negative since admission. -Continue lasix at current dose of 60 mg IV BID for now, follow renal function closely. -Not on ACE inhibitor/ARB due to renal dysfunction. -Continue Coreg.  Hyperlipidemia -Continue statin  Stage III-IV chronic kidney disease -Creatinine remains at baseline around 1.6 despite diuresis. -1/29 a little better than baseline.  Type 2 diabetes -Well controlled  Hypertension -Fair control. -Continue current management.  UTI -Cx with >100,000 Citrobacter resistant to rocephin. -Will transition over to cipro for 5 days.   DVT prophylaxis: Lovenox Code Status: Full Code Family Communication: patient only Disposition Plan: home with [facility] pending medical improvement  Consultants:   None  Procedures:   None  Antimicrobials:  Anti-infectives (From admission, onward)   Start     Dose/Rate Route Frequency Ordered Stop   11/08/17 2100   ciprofloxacin (CIPRO) tablet 250 mg     250 mg Oral 2 times daily 11/08/17 2059 11/13/17 1959   11/06/17 2000  cefTRIAXone (ROCEPHIN) 1 g in dextrose 5 % 50 mL IVPB  Status:  Discontinued     1 g 100 mL/hr over 30 Minutes Intravenous Every 24 hours 11/05/17 1939 11/08/17 2059   11/05/17 2200  fluconazole (DIFLUCAN) tablet 150 mg     150 mg Oral  Once 11/05/17 1939 11/05/17 2235   11/05/17 1815  cefTRIAXone (ROCEPHIN) 1 g in dextrose 5 % 50 mL IVPB     1 g 100 mL/hr over 30 Minutes Intravenous  Once 11/05/17 1812 11/05/17 2012       Subjective: Very weak, denies CP, no SOB.  Objective: Vitals:   11/08/17 2120 11/08/17 2132 11/09/17 0525 11/09/17 0825  BP:  (!) 130/54 134/69 (!) 147/60  Pulse:  70 68   Resp:  16 18   Temp:  98.5 F (36.9 C) 97.8 F (36.6 C)   TempSrc:  Oral Oral   SpO2: 92% 94% 98%   Weight:   90.4 kg (199 lb 4.7 oz)   Height:        Intake/Output Summary (Last 24 hours) at 11/09/2017 1537 Last data filed at 11/09/2017 1202 Gross per 24 hour  Intake 240 ml  Output 1300 ml  Net -1060 ml   Filed Weights   11/07/17 0520 11/08/17 0435 11/09/17 0525  Weight: 92 kg (202 lb 13.2 oz) 89.4 kg (197 lb 1.5 oz) 90.4 kg (199 lb 4.7 oz)  Examination:   General exam: Alert, awake, oriented x 3 Respiratory system: Clear to auscultation. Respiratory effort normal. Cardiovascular system:RRR. No murmurs, rubs, gallops. Gastrointestinal system: Abdomen is nondistended, soft and nontender. No organomegaly or masses felt. Normal bowel sounds heard. Central nervous system: Alert and oriented. No focal neurological deficits. Extremities: 3++ pitting edema bilaterally. Skin: No rashes, lesions or ulcers Psychiatry: Judgement and insight appear normal. Mood & affect appropriate.     Data Reviewed: I have personally reviewed following labs and imaging studies  CBC: Recent Labs  Lab 11/05/17 1556  WBC 6.2  NEUTROABS 4.4  HGB 10.0*  HCT 34.4*  MCV 97.2  PLT 221     Basic Metabolic Panel: Recent Labs  Lab 11/05/17 1556 11/06/17 0724 11/07/17 0445 11/08/17 0527 11/09/17 0422  NA 143 139 141 138 139  K 4.1 3.6 3.8 3.3* 3.8  CL 105 101 103 99* 101  CO2 26 27 28 29 29   GLUCOSE 96 98 87 86 93  BUN 45* 43* 43* 40* 38*  CREATININE 1.69* 1.67* 1.71* 1.52* 1.53*  CALCIUM 8.8* 8.3* 8.5* 8.2* 8.3*   GFR: Estimated Creatinine Clearance: 42 mL/min (A) (by C-G formula based on SCr of 1.53 mg/dL (H)). Liver Function Tests: Recent Labs  Lab 11/05/17 1556  AST 30  ALT 13*  ALKPHOS 42  BILITOT 1.4*  PROT 7.1  ALBUMIN 2.7*   No results for input(s): LIPASE, AMYLASE in the last 168 hours. No results for input(s): AMMONIA in the last 168 hours. Coagulation Profile: No results for input(s): INR, PROTIME in the last 168 hours. Cardiac Enzymes: Recent Labs  Lab 11/05/17 1556  TROPONINI <0.03   BNP (last 3 results) No results for input(s): PROBNP in the last 8760 hours. HbA1C: No results for input(s): HGBA1C in the last 72 hours. CBG: Recent Labs  Lab 11/08/17 0714 11/08/17 1113 11/08/17 1600 11/08/17 2129 11/09/17 0758  GLUCAP 80 76 113* 108* 118*   Lipid Profile: No results for input(s): CHOL, HDL, LDLCALC, TRIG, CHOLHDL, LDLDIRECT in the last 72 hours. Thyroid Function Tests: No results for input(s): TSH, T4TOTAL, FREET4, T3FREE, THYROIDAB in the last 72 hours. Anemia Panel: No results for input(s): VITAMINB12, FOLATE, FERRITIN, TIBC, IRON, RETICCTPCT in the last 72 hours. Urine analysis:    Component Value Date/Time   COLORURINE YELLOW 11/05/2017 1650   APPEARANCEUR HAZY (A) 11/05/2017 1650   LABSPEC 1.010 11/05/2017 1650   PHURINE 5.0 11/05/2017 1650   GLUCOSEU NEGATIVE 11/05/2017 1650   HGBUR SMALL (A) 11/05/2017 1650   BILIRUBINUR NEGATIVE 11/05/2017 1650   KETONESUR NEGATIVE 11/05/2017 1650   PROTEINUR 30 (A) 11/05/2017 1650   UROBILINOGEN 0.2 08/03/2013 0936   NITRITE POSITIVE (A) 11/05/2017 1650   LEUKOCYTESUR LARGE  (A) 11/05/2017 1650   Sepsis Labs: @LABRCNTIP (procalcitonin:4,lacticidven:4)  ) Recent Results (from the past 240 hour(s))  Urine Culture     Status: Abnormal   Collection Time: 11/05/17  4:47 PM  Result Value Ref Range Status   Specimen Description URINE, CATHETERIZED  Final   Special Requests NONE  Final   Culture >=100,000 COLONIES/mL CITROBACTER BRAAKII (A)  Final   Report Status 11/08/2017 FINAL  Final   Organism ID, Bacteria CITROBACTER BRAAKII (A)  Final      Susceptibility   Citrobacter braakii - MIC*    CEFAZOLIN >=64 RESISTANT Resistant     CEFTRIAXONE >=64 RESISTANT Resistant     CIPROFLOXACIN 1 SENSITIVE Sensitive     GENTAMICIN >=16 RESISTANT Resistant     IMIPENEM 1  SENSITIVE Sensitive     NITROFURANTOIN 32 SENSITIVE Sensitive     TRIMETH/SULFA <=20 SENSITIVE Sensitive     PIP/TAZO 16 SENSITIVE Sensitive     * >=100,000 COLONIES/mL CITROBACTER BRAAKII         Radiology Studies: No results found.      Scheduled Meds: . amLODipine  10 mg Oral Daily  . aspirin  325 mg Oral Daily  . carvedilol  6.25 mg Oral BID WC  . ciprofloxacin  250 mg Oral BID  . clopidogrel  75 mg Oral Daily  . collagenase   Topical Daily  . enoxaparin (LOVENOX) injection  40 mg Subcutaneous Q24H  . fenofibrate  160 mg Oral q morning - 16X  . folic acid  1 mg Oral Daily  . furosemide  60 mg Intravenous Q12H  . gabapentin  200 mg Oral QHS  . hydrALAZINE  100 mg Oral Q8H  . insulin aspart  0-9 Units Subcutaneous TID WC  . insulin glargine  10 Units Subcutaneous QHS  . nystatin cream   Topical TID  . potassium chloride SA  40 mEq Oral Daily  . simvastatin  20 mg Oral q1800  . sodium chloride flush  3 mL Intravenous Q12H  . thiamine  100 mg Oral Daily  . cyanocobalamin  1,000 mcg Oral Daily   Continuous Infusions: . sodium chloride       LOS: 4 days    Time spent: 25 minutes. Greater than 50% of this time was spent in direct contact with the patient coordinating  care.     Lelon Frohlich, MD [facility] Pager [phone]  If 7PM-7AM, please contact night-coverage [email or web address] Password [facility] 11/09/2017, 3:37 PM

## 2017-11-09 NOTE — Consult Note (Signed)
[place] Nurse wound consult note completed by use of telehealth consultation and assistance of bedside clinical staff.  Reason for Consult:multiple wounds, MASD Wound type: 1. Stage 2 pressure injury sacrum 2. Stage 3 pressure injury right heel 3. Unstageable pressure injury left trochanter 4. Moisture Associated Skin damage posterior thighs and buttocks related to incontinence of bowel and bladder 5. Intertriginous dermatitis; inframammary and under her pannus related to moisture and fungal overgrowth Pressure Injury POA: Yes Measurement: see nursing flow sheets Wound bed: Sacrum: clean, pink, moist, partial thickness skin loss, with evidence of larger area of re-epithelialization Left trochanter: 100% soft yellow/black Right heel: dark pink, open, moist with superficial skin loss along the dorsal aspect of the wound edge  Areas of moisture over the buttocks and thighs area superficial with skin pealing consistent with moisture associated skin damage.  Linear areas under the breast that are bleeding, but pink and moist from moisture, partial thickness skin loss.  Drainage (amount, consistency, odor) no significant purulence from any of Mrs. Verno's wounds.  Periwound:intact Dressing procedure/placement/frequency: Add enzymatic debridement to the left trochanter wound to clean up non viable tissue.  Add low air loss mattress for moisture relief and pressure redistribution Soft silicone foam to protect heel and sacrum from further damage.  Off load heels with use of Prevalon boots, ordered today Maximize nutrition for wound healing, consider MVI, zinc and Vit. C.  Moisture barrier cream to the buttocks and thigh to protect skin from urine and stool Female external urinary containment device in place Antimicrobial wicking textile to wick moisture from the skin folds under the breast and pannus.    Discussed POC with patient and bedside nurse.  Re consult if needed, will not follow at this  time. Thanks  Crystol Walpole [employer], RN,CWOCN, CNS, [place] [phone])

## 2017-11-09 NOTE — Care Management Note (Signed)
Case Management Note  Patient Details  Name: Madison Reed MRN: [id number] Date of Birth: 04-26-1951  Subjective/Objective:            Admitted with CHF. She is from home, lives with her husband. Pt is bed-bound and has been since her stay at [facility] middle of last year. She began [facility] PT and nursing about two weeks ago through [facility]. When asked the purpose of [place] and what her goal was with PT pt states she 'wants to talk again". Pt uses WC to get to appointments but is in bed all day. Her husband works until 4:15pm. She is alone in the AM, eats a sandwich for lungh, her daughter comes after lunch and stays until about 2:15, goes to pick her children from school, sometimes comes back, sometimes does not. Pt's husband prepares dinner and pt says he follows diet restrictions when preparing meals. Pt understands she would have to pay [facility]OP for an aid to be with her during the day. RN says daughter has been saying pt needs an aid. Pt has hospital bed, BSC, WC, oxygen and neb machine pta.       Action/Plan: Anticipate DC home with resumption of [place] services. [facility] rep, Juliann Pulse, aware pt admitted to hospital. Pt will need air mattress if she does not currently have one d/t wounds. CM has attempted to call dtr to discuss aid options. CM will cont to follow.   Expected Discharge Date:  11/07/17               Expected Discharge Plan:  [place]  In-House Referral:  NA  Discharge planning Services  CM Consult  Post Acute Care Choice:  Home Health, Resumption of Svcs/PTA Provider Choice offered to:  Patient  HH Arranged:  RN, PT [facility] Agency:  [place]  Status of Service:  In process, will continue to follow  If discussed at Long Length of Stay Meetings, dates discussed:    Additional Comments:  Sherald Barge, RN 11/09/2017, 2:28 PM

## 2017-11-09 NOTE — Consult Note (Signed)
   [facility] Inpatient Consult   04/21/6578  ERIYONNA MATSUSHITA [birth year] [id number]   Chart review revealed patient eligible for [place] Management services and post hospital discharge follow up related to a diagnosis of HF, CVA, and 3 admits in 6 months. Patient was evaluated for community based chronic disease management services with [facility] care Management Program as a benefit of patient's [employer]. Met with the patient at the bedside to explain [place] Management services. Patient states she is in need of in home care such as an aide related to her family works during the day. Verbal consent recieved. Patient gave [phone] as the best number to reach her and stated to please leave a voice mail. She also gave verbal permission to call her daughter [name] at [phone] if she can not be reached. Patient will receive post hospital discharge calls and be evaluated for monthly home visits. Patient will also be contacted by a [facility] Social Worker to assist with community resources related to in home care options. [facility] Care Management services does not interfere with or replace any services arranged by the inpatient care management team. RNCM left contact information and [facility] literature at the bedside. Made inpatient RNCM aware that [facility] will be following for care management. For additional questions please contact:   Akanksha Bellmore RN, [place] [facility] Liaison  [phone]) Business Mobile [phone]) Toll free office

## 2017-11-10 ENCOUNTER — Telehealth: Payer: Self-pay | Admitting: *Deleted

## 2017-11-10 ENCOUNTER — Encounter (HOSPITAL_COMMUNITY): Payer: Self-pay | Admitting: Family Medicine

## 2017-11-10 DIAGNOSIS — B372 Candidiasis of skin and nail: Secondary | ICD-10-CM

## 2017-11-10 DIAGNOSIS — R0600 Dyspnea, unspecified: Secondary | ICD-10-CM

## 2017-11-10 DIAGNOSIS — N184 Chronic kidney disease, stage 4 (severe): Secondary | ICD-10-CM

## 2017-11-10 DIAGNOSIS — I63523 Cerebral infarction due to unspecified occlusion or stenosis of bilateral anterior cerebral arteries: Secondary | ICD-10-CM

## 2017-11-10 DIAGNOSIS — L89152 Pressure ulcer of sacral region, stage 2: Secondary | ICD-10-CM

## 2017-11-10 DIAGNOSIS — N3000 Acute cystitis without hematuria: Secondary | ICD-10-CM

## 2017-11-10 DIAGNOSIS — I10 Essential (primary) hypertension: Secondary | ICD-10-CM

## 2017-11-10 DIAGNOSIS — E119 Type 2 diabetes mellitus without complications: Secondary | ICD-10-CM

## 2017-11-10 DIAGNOSIS — I5033 Acute on chronic diastolic (congestive) heart failure: Secondary | ICD-10-CM

## 2017-11-10 DIAGNOSIS — J9611 Chronic respiratory failure with hypoxia: Secondary | ICD-10-CM

## 2017-11-10 LAB — BASIC METABOLIC PANEL
Anion gap: 10 (ref 5–15)
BUN: 38 mg/dL — AB (ref 6–20)
CO2: 30 mmol/L (ref 22–32)
CREATININE: 1.57 mg/dL — AB (ref 0.44–1.00)
Calcium: 8.3 mg/dL — ABNORMAL LOW (ref 8.9–10.3)
Chloride: 99 mmol/L — ABNORMAL LOW (ref 101–111)
GFR calc non Af Amer: 34 mL/min — ABNORMAL LOW (ref >60)
GFR, EST AFRICAN AMERICAN: 39 mL/min — AB (ref >60)
Glucose, Bld: 71 mg/dL (ref 65–99)
Potassium: 3.7 mmol/L (ref 3.5–5.1)
Sodium: 139 mmol/L (ref 135–145)

## 2017-11-10 LAB — GLUCOSE, CAPILLARY
GLUCOSE-CAPILLARY: 78 mg/dL (ref 65–99)
GLUCOSE-CAPILLARY: 118 mg/dL — AB (ref 65–99)
Glucose-Capillary: 72 mg/dL (ref 65–99)

## 2017-11-10 MED ORDER — FUROSEMIDE 20 MG PO TABS: 60.0000 mg | ORAL_TABLET | Freq: Every day | ORAL | 0 refills | Status: AC

## 2017-11-10 MED ORDER — FUROSEMIDE 40 MG PO TABS: 60.0000 mg | ORAL_TABLET | Freq: Every day | ORAL | Status: DC

## 2017-11-10 MED ORDER — POTASSIUM CHLORIDE CRYS ER 20 MEQ PO TBCR: 20.0000 meq | EXTENDED_RELEASE_TABLET | Freq: Two times a day (BID) | ORAL | Status: AC

## 2017-11-10 MED ORDER — NYSTATIN 100000 UNIT/GM EX CREA: TOPICAL_CREAM | Freq: Three times a day (TID) | CUTANEOUS | 0 refills | Status: AC

## 2017-11-10 MED ORDER — CIPROFLOXACIN HCL 250 MG PO TABS: 250.0000 mg | ORAL_TABLET | Freq: Two times a day (BID) | ORAL | 0 refills | Status: AC

## 2017-11-10 NOTE — Care Management (Signed)
Wound measurements:  Unstagable on Right Heel    Length: 2.5cm    Width: 2.5 cm     Surface area: 6.25 cm^2  Unstagable on left hip:     Length: 4.5cm     Width: 3.5cm      Surface area: 15.75 cm^2  Stage II on sacrum (to be documented by bedside RN)  CM has pulled documentation from flowsheets*

## 2017-11-10 NOTE — Care Management Note (Addendum)
Case Management Note  Patient Details  Name: Madison Reed MRN: [id number] Date of Birth: January 10, 1951  Expected Discharge Date:  11/10/17               Expected Discharge Plan:  [place]  In-House Referral:  NA  Discharge planning Services  CM Consult  Post Acute Care Choice:  [place], Resumption of Svcs/PTA Provider, Durable Medical Equipment Choice offered to:  Patient  DME Arranged:  Hospital bed, Specialty mattress DME Agency:  [place] Arranged:  RN, PT [facility] Agency:  [place]  Status of Service:  Completed, signed off  Additional Comments: Discharging home today with resumption of [facility] services. Pt aware HH has 48 hrs to make resumption visit. Pt will need air mattress d/t wounds. Pt has old hospital bed that was given to her by a family member. Pt will need new bed to accommodate the mattress. Pt has chosen [facility] from DME provider options. CM has collected all info for referrals and faxed HH/DME orders to [facility] office. Bed will need to be delivered prior to pt going home. Juliann Pulse, [facility] rep, aware of DC today. Pt will need EMS transport home. Transport will be arranged once bed has been delivered. CM has spoken with pt's daughter [name]) over the phone about aid services and payor sources for aid services. Daughter is aware OOP is pt's only option at this time. Pt has agreed to [place] services at DC also.   Sherald Barge, RN 11/10/2017, 1:29 PM

## 2017-11-10 NOTE — Care Management Important Message (Signed)
Important Message  Patient Details  Name: Madison Reed MRN: [id number] Date of Birth: [birth year]   Medicare Important Message Given:  Yes    Sherald Barge, RN 11/10/2017, 1:28 PM

## 2017-11-10 NOTE — Progress Notes (Signed)
   11/10/17 1000  Pressure Injury 11/09/17 Stage II -  Partial thickness loss of dermis presenting as a shallow open ulcer with a red, pink wound bed without slough.  Date First Assessed/Time First Assessed: 11/09/17 1319   Location: Sacrum  Location Orientation: Medial;Posterior  Staging: Stage II -  Partial thickness loss of dermis presenting as a shallow open ulcer with a red, pink wound bed without slough.  Pre...  State of Healing Non-healing  Site / Wound Assessment Pink;Red;Painful  % Wound base Red or Granulating 100%  % Wound base Yellow/Fibrinous Exudate 0%  % Wound base Black/Eschar 0%  % Wound base Other/Granulation Tissue (Comment) 0%  Peri-wound Assessment Excoriated  Wound Length (cm) 6.5 cm  Wound Width (cm) 2.5 cm  Wound Surface Area (cm^2) 16.25 cm^2  Tunneling (cm) 0  Undermining (cm) 0  Margins Unattached edges (unapproximated)  Drainage Amount Minimal

## 2017-11-10 NOTE — Care Management (Addendum)
Patient requires frequent re-positioning of the body in ways that cannot be achieved with an ordinary bed or wedge pillow, to eliminate pain, reduce pressure, and the head of the bed to be elevated more than 30 degrees most of the time due to CHF 

## 2017-11-10 NOTE — Progress Notes (Signed)
Wound care performed per order and all dressings changed.

## 2017-11-10 NOTE — Care Management (Signed)
Patient Information   Patient Name Madison Reed, Madison Reed ([id number]) Sex Female DOB 1951/03/19  Room Bed  A331 A331-01  Patient Demographics   Address [address] [place] [address] Phone [phone] (Home) *Preferred*  Patient Ethnicity & Race   Ethnic Group Patient Race  Not Hispanic or Latino White or Caucasian  Emergency Contact(s)   Name Relation Home Work Mobile  Tew,Kimberly Daughter   [phone]  Floyce, B[place]ak [phone]  [phone]  Makynlie, Rossini   [phone]  Documents on File    Status Date Received Description  Documents for the Patient  Release of Information Not Received    [place] Received 03/08/12   [place] E-Signature HIPAA Notice of Privacy Received 80/99/83   Driver's License Not Received    Insurance Card Received 03/08/12 UHC Choice Plus/AP Outpt rehab  Advance Directives/Living Will/HCPOA/POA Not Received    Insurance Card Not Received    [place] Not Received    Driver's License Received 38/25/05   HIM ROI Authorization  11/24/13   Release of Information  11/27/13   Other Photo ID Not Received    Insurance Card Received 03/07/15   [place] E-Signature HIPAA Notice of Privacy Signed 04/30/17   [place] E-Signature HIPAA Notice of Privacy [place]     Advanced Beneficiary Notice (ABN) Not Received    E-Signature AOB [place] Not Received    Insurance Card Received 07/06/17 new medicare card  Patient Photo   Photo of Patient  Documents for the Encounter  AOB (Assignment of Insurance Benefits) Not Received    E-signature AOB Signed 11/05/17   MEDICARE RIGHTS Not Received    E-signature Medicare Rights Signed 11/05/17   ED Patient Billing Extract   ED PB Billing Extract  Cardiac Monitoring Strip Shift Summary Received 11/05/17   Admission Information   Attending Provider Admitting Provider Admission Type Admission Date/Time  Murlean Iba,  MD Phillips Grout, MD Emergency 11/05/17 1535  Discharge Date Hospital Service Auth/Cert Status Service Area   Internal Medicine Incomplete [place]  Unit Room/Bed Admission Status   AP-DEPT 300 A331/A331-01 Admission (Confirmed)   Admission   [facility] Account   Name Acct ID Class Status Primary Coverage  Kent, Chaye W [id number] Inpatient Open UNITED HEALTHCARE - UNITED HEALTHCARE OTHER      Guarantor Account (for [place] [id number])   Name Relation to [place]? Acct Type  Mccolm, [place] Yes Personal/Family  Address Phone    [address] [place], [place] [address] [phone])        Coverage Information (for Hospital Account [id number])   [place]   F/O Payor/Plan Precert #  [facility] OTHER   Subscriber Subscriber #  Ailish, Prospero [id number]  Address Phone  [address] [place] [address] [phone]  2. [employer] PART A AND B   F/O Payor/Plan Precert #  MEDICARE/MEDICARE PART A AND B   Subscriber Subscriber #  Lajuanda, Penick [id number]  Address Phone  [address] [place], [place] [address]

## 2017-11-10 NOTE — Patient Outreach (Signed)
[facility]) Care Management  02/28/8021  TAMMRA PRESSMAN [birth year] [id number]   CARE COORDINATION  Community CM received a referral on 11/09/17 from hospital liaison, Merlene Morse -Please assign patient for community nurse to engage for transition of care calls and evaluate for monthly home visits. Please also assign [facility] LCSW to assist with community resources related to in home care needs. Patient gave [phone] as best contact number and also gave verbal permission to call her daughter [name] at [phone]. Patient stated she has voice mail and will call back if a message is left. For questions please contact:    11/10/17 Community CM called but no answer and CM unable to leave a voice message at [phone] Community CM left a HIPPA compliant voice message for  including [facility] CM mobile number for a return call for [employer].   PLANS Pending return call to Cm from patient or her daughter, Maudie Mercury, CM will attempt to make another outreach call in 1-2 days  Kimberly L. Lavina Hamman, RN, BSN, [place] Coordinator [phone] week day mobile

## 2017-11-10 NOTE — Discharge Summary (Signed)
Physician Discharge Summary  Madison Reed IRS:[id number] DOB: 1951/01/30 DOA: 11/05/2017  PCP: Celene Squibb, MD  Admit date: 11/05/2017 Discharge date: 11/10/2017  Admitted From: Home  Disposition: Home   Recommendations for Outpatient Follow-up:  1. Follow up with PCP in 1 weeks 2. Please follow up with cardiologist in 2 weeks.  3. Please adjust diuretic dose as appropriate on outpatient follow up. 4. Please obtain BMP/CBC in one week. 5. Please follow up on the following pending results: Final Culture data  Home Health: RN  Discharge Condition: STABLE   CODE STATUS: FULL    Brief Hospitalization Summary: Please see all hospital notes, images, labs for full details of the hospitalization.  HPI: Madison Reed is a 67 y.o. female with medical history significant of diastolic congestive heart failure, CVA in the past currently bedbound, hypertension, diabetes lives at home with her son who takes care of her at home who has home health at home.  At her baseline she is bedbound.  Yesterday some labs were checked by her primary care physician.  They were called today to come to the emergency department as she may be in heart failure.  Patient has been swelling for about a week in her lower extremity.  She is chronically on 3 L of oxygen.  This is stable.  She chronically has orthopnea.  Her breathing has been at her baseline.  She has not had any fever or cough.  Patient is compliant with her medications at home.  Patient is referred from the emergency department for admission because of her chronic medical problems and the need for diuresis.  There has been no adjustment in her home diuretic dosing recently.  Acute on chronic diastolic heart failure -Echo in November 2018 showed an ejection fraction of 60%. -She has diuresed well and feeling better back to baseline.  -Per documented intake and output she is 2.5 L negative since admission. -Treated with lasix at 60 mg IV BID, followed renal  function closely. -Not on ACE inhibitor/ARB due to renal dysfunction. -Continue Coreg. -Discharge on lasix 60 mg oral daily, up from 40 mg daily, follow up with cardiology in 2 weeks, follow up with PCP in 1 week.   Hyperlipidemia -Continue statin  Stage III-IV chronic kidney disease -Creatinine remains at baseline around 1.6 despite diuresis. -1/29 a little better than baseline.  Type 2 diabetes -Well controlled.   Hypertension -Fair control. -Continue current management.  UTI -Cx with >100,000 Citrobacter resistant to rocephin. -Will transition over to cipro for 5 days.  Stage 2 pressure injury sacrum Stage 3 pressure injury right heel Unstageable pressure injury left trochanter Moisture Associated Skin damage posterior thighs and buttocks related to incontinence of bowel and bladder Intertriginous dermatitis; inframammary and under her pannus related to moisture and fungal overgrowth Pt has been ordered for an air mattress to be delivered prior to discharge.   Home health RN has been ordered to resume care upon discharge.   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 ([place])  old   Dyspnea   DM type 2 (diabetes mellitus, type 2) ([place])   Chronic respiratory failure with hypoxia ([place])   Essential hypertension   Stage II pressure ulcer of sacral region   UTI (  urinary tract infection)   CKD (chronic kidney disease) stage 4, GFR 15-29 ml/min (HCC)   Right hemiparesis [facility]) old   Candidal skin infection  Discharge Instructions: Discharge Instructions    (HEART FAILURE PATIENTS) Call MD:  Anytime you have any of the following symptoms: 1) 3 pound weight gain in 24 hours or 5 pounds in 1 week  2) shortness of breath, with or without a dry hacking cough 3) swelling in the hands, feet or stomach 4) if you have to sleep on extra pillows at night in order to breathe.   Complete by:  As directed    AMB Referral to [place] [facility]   Complete by:  As directed    Please assign patient for community nurse to engage for transition of care calls and evaluate for monthly home visits. Please also assign [facility] LCSW to assist with community resources related to in home care needs. Patient gave [phone] as best contact number and also gave verbal permission to call her daughter [name] at [phone]. Patient stated she has voice mail and will call back if a message is left. For questions please contact:   Janci Minor RN, [facility] Liaison [phone])   Reason for consult:  Post hospital discharge follow up with [facility] RN Community Case Manager and [facility] LCSW   Diagnoses of:   Heart Failure Stroke: Ischemic/TIA     Expected date of contact:  1-3 days (reserved for hospital discharges)   Call MD for:  difficulty breathing, headache or visual disturbances   Complete by:  As directed    Call MD for:  extreme fatigue   Complete by:  As directed    Call MD for:  persistant dizziness or light-headedness   Complete by:  As directed    Call MD for:  persistant nausea and vomiting   Complete by:  As directed    Call MD for:  severe uncontrolled pain   Complete by:  As directed    Diet - low sodium heart healthy   Complete by:  As directed    Increase activity slowly   Complete by:  As directed      Allergies as of 11/10/2017      Reactions   Glipizide    Severe hypoglycemia (22!)   Penicillins Itching   Tolerated Cefepime Has patient had a PCN reaction causing immediate rash, facial/tongue/throat swelling, SOB or lightheadedness with hypotension: Yes Has patient had a PCN reaction causing severe rash involving mucus membranes or skin necrosis: No Has patient had a PCN reaction  that required hospitalization: No Has patient had a PCN reaction occurring within the last 10 years: No If all of the above answers are "NO", then may proceed with Cephalosporin use.   Clonidine Derivatives Other (See Comments)   Numbness  to tongue face, and arm   Codeine Itching   Hydrocodone-acetaminophen Itching   Plasticized Base [plastibase]    Shellfish Allergy Itching   Tape    Sulfa Antibiotics Itching, Rash      Medication List    TAKE these medications   amLODipine 10 MG tablet Commonly known as:  NORVASC Take 10 mg by mouth daily.   aspirin 325 MG tablet Take 1 tablet (325 mg total) by mouth daily.   carvedilol 6.25 MG tablet Commonly known as:  COREG Take 1 tablet (6.25 mg total) by mouth 2 (two) times daily with a meal.   ciprofloxacin 250 MG tablet Commonly known as:  CIPRO  Take 1 tablet (250 mg total) by mouth 2 (two) times daily for 3 days.   clopidogrel 75 MG tablet Commonly known as:  PLAVIX Take 1 tablet (75 mg total) by mouth daily.   cyanocobalamin 1000 MCG tablet Take 1 tablet (1,000 mcg total) by mouth daily.   fenofibrate 160 MG tablet Take 160 mg by mouth every morning.   fexofenadine 180 MG tablet Commonly known as:  ALLEGRA Take 180 mg by mouth at bedtime.   folic acid 1 MG tablet Commonly known as:  FOLVITE Take 1 tablet (1 mg total) by mouth daily.   furosemide 20 MG tablet Commonly known as:  LASIX Take 3 tablets (60 mg total) by mouth daily for 14 days. What changed:    medication strength  how much to take   gabapentin 100 MG capsule Commonly known as:  NEURONTIN Take 200 mg by mouth at bedtime.   hydrALAZINE 100 MG tablet Commonly known as:  APRESOLINE Take 1 tablet (100 mg total) by mouth every 8 (eight) hours.   insulin glargine 100 UNIT/ML injection Commonly known as:  LANTUS Inject 0.1 mLs (10 Units total) into the skin daily. What changed:  when to take this   nystatin cream Commonly known as:   MYCOSTATIN Apply topically 3 (three) times daily.   omeprazole 20 MG capsule Commonly known as:  PRILOSEC Take 20 mg by mouth every morning.   potassium chloride SA 20 MEQ tablet Commonly known as:  K-DUR,KLOR-CON Take 1 tablet (20 mEq total) by mouth 2 (two) times daily.   simvastatin 20 MG tablet Commonly known as:  ZOCOR Take 1 tablet (20 mg total) by mouth daily at 6 PM.   thiamine 100 MG tablet Commonly known as:  VITAMIN B-1 Take 1 tablet (100 mg total) by mouth daily.            Durable Medical Equipment  (From admission, onward)        Start     Ordered   11/10/17 1023  For home use only DME Hospital bed  Once    Question Answer Comment  Patient has (list medical condition): CHF, mulitple wounds   The above medical condition requires: Patient requires the ability to reposition frequently   Head must be elevated greater than: 30 degrees   Bed type Semi-electric   Support Surface: Low Air loss Mattress      11/10/17 1023     Follow-up Information    Celene Squibb, MD. Schedule an appointment as soon as possible for a visit in 1 week(s).   Specialty:  Internal Medicine Why:  Hospital Follow Up  Contact information: [place] [facility] [address] [phone]        Herminio Commons, MD. Schedule an appointment as soon as possible for a visit in 2 week(s).   Specialty:  Cardiology Why:  Hospital Follow Up: CHF exacerbation  Contact information: [place] [address] [phone]          Allergies  Allergen Reactions  . Glipizide     Severe hypoglycemia (22!)  . Penicillins Itching    Tolerated Cefepime Has patient had a PCN reaction causing immediate rash, facial/tongue/throat swelling, SOB or lightheadedness with hypotension: Yes Has patient had a PCN reaction causing severe rash involving mucus membranes or skin necrosis: No Has patient had a PCN reaction that required hospitalization: No Has patient had a PCN  reaction occurring within the last 10 years: No If  all of the above answers are "NO", then may proceed with Cephalosporin use.   . Clonidine Derivatives Other (See Comments)    Numbness  to tongue face, and arm  . Codeine Itching  . Hydrocodone-Acetaminophen Itching  . Plasticized Base [Plastibase]   . Shellfish Allergy Itching  . Tape   . Sulfa Antibiotics Itching and Rash   Allergies as of 11/10/2017      Reactions   Glipizide    Severe hypoglycemia (22!)   Penicillins Itching   Tolerated Cefepime Has patient had a PCN reaction causing immediate rash, facial/tongue/throat swelling, SOB or lightheadedness with hypotension: Yes Has patient had a PCN reaction causing severe rash involving mucus membranes or skin necrosis: No Has patient had a PCN reaction that required hospitalization: No Has patient had a PCN reaction occurring within the last 10 years: No If all of the above answers are "NO", then may proceed with Cephalosporin use.   Clonidine Derivatives Other (See Comments)   Numbness  to tongue face, and arm   Codeine Itching   Hydrocodone-acetaminophen Itching   Plasticized Base [plastibase]    Shellfish Allergy Itching   Tape    Sulfa Antibiotics Itching, Rash      Medication List    TAKE these medications   amLODipine 10 MG tablet Commonly known as:  NORVASC Take 10 mg by mouth daily.   aspirin 325 MG tablet Take 1 tablet (325 mg total) by mouth daily.   carvedilol 6.25 MG tablet Commonly known as:  COREG Take 1 tablet (6.25 mg total) by mouth 2 (two) times daily with a meal.   ciprofloxacin 250 MG tablet Commonly known as:  CIPRO Take 1 tablet (250 mg total) by mouth 2 (two) times daily for 3 days.   clopidogrel 75 MG tablet Commonly known as:  PLAVIX Take 1 tablet (75 mg total) by mouth daily.   cyanocobalamin 1000 MCG tablet Take 1 tablet (1,000 mcg total) by mouth daily.   fenofibrate 160 MG tablet Take 160 mg by mouth every morning.    fexofenadine 180 MG tablet Commonly known as:  ALLEGRA Take 180 mg by mouth at bedtime.   folic acid 1 MG tablet Commonly known as:  FOLVITE Take 1 tablet (1 mg total) by mouth daily.   furosemide 20 MG tablet Commonly known as:  LASIX Take 3 tablets (60 mg total) by mouth daily for 14 days. What changed:    medication strength  how much to take   gabapentin 100 MG capsule Commonly known as:  NEURONTIN Take 200 mg by mouth at bedtime.   hydrALAZINE 100 MG tablet Commonly known as:  APRESOLINE Take 1 tablet (100 mg total) by mouth every 8 (eight) hours.   insulin glargine 100 UNIT/ML injection Commonly known as:  LANTUS Inject 0.1 mLs (10 Units total) into the skin daily. What changed:  when to take this   nystatin cream Commonly known as:  MYCOSTATIN Apply topically 3 (three) times daily.   omeprazole 20 MG capsule Commonly known as:  PRILOSEC Take 20 mg by mouth every morning.   potassium chloride SA 20 MEQ tablet Commonly known as:  K-DUR,KLOR-CON Take 1 tablet (20 mEq total) by mouth 2 (two) times daily.   simvastatin 20 MG tablet Commonly known as:  ZOCOR Take 1 tablet (20 mg total) by mouth daily at 6 PM.   thiamine 100 MG tablet Commonly known as:  VITAMIN B-1 Take 1 tablet (100 mg total) by mouth daily.  Durable Medical Equipment  (From admission, onward)        Start     Ordered   11/10/17 1023  For home use only DME Hospital bed  Once    Question Answer Comment  Patient has (list medical condition): CHF, mulitple wounds   The above medical condition requires: Patient requires the ability to reposition frequently   Head must be elevated greater than: 30 degrees   Bed type Semi-electric   Support Surface: Low Air loss Mattress      11/10/17 1023      Procedures/Studies: Dg Chest Port 1 View  Result Date: 11/05/2017 CLINICAL DATA:  CHF EXAM: PORTABLE CHEST 1 VIEW COMPARISON:  08/30/2017 FINDINGS: Stable cardiomegaly with  aortic atherosclerosis. Interval increase in pulmonary vascular redistribution and edema with obscuration of the left hemidiaphragm likely from layering effusion with atelectasis. There is cephalization of the pulmonary vessels. Trace fissural fluid noted on the right. No acute osseous abnormality. IMPRESSION: Slight worsening of CHF. Stable cardiomegaly. Aortic atherosclerosis. Bilateral left greater than right pleural effusions. Electronically Signed   By: Ashley Royalty M.D.   On: 11/05/2017 16:14      Subjective: Pt without complaints.  Says she would like to go home.    Discharge Exam: Vitals:   11/10/17 0500 11/10/17 0906  BP: (!) 148/44 133/70  Pulse: 66 70  Resp:  18  Temp: 97.8 F (36.6 C)   SpO2: 96% 97%   Vitals:   11/09/17 2046 11/09/17 2148 11/10/17 0500 11/10/17 0906  BP:  (!) 138/48 (!) 148/44 133/70  Pulse:  66 66 70  Resp:  18  18  Temp:  97.6 F (36.4 C) 97.8 F (36.6 C)   TempSrc:  Oral Oral   SpO2: 92% 99% 96% 97%  Weight:      Height:       General: Pt is alert, awake, not in acute distress Cardiovascular:  normal S1/S2 +, no rubs, no gallops Respiratory: CTA bilaterally, no wheezing, no rhonchi Abdominal: Soft, NT, ND, bowel sounds + Extremities: no edema, no cyanosis   The results of significant diagnostics from this hospitalization (including imaging, microbiology, ancillary and laboratory) are listed below for reference.    Microbiology: Recent Results (from the past 240 hour(s))  Urine Culture     Status: Abnormal   Collection Time: 11/05/17  4:47 PM  Result Value Ref Range Status   Specimen Description URINE, CATHETERIZED  Final   Special Requests NONE  Final   Culture >=100,000 COLONIES/mL CITROBACTER BRAAKII (A)  Final   Report Status 11/08/2017 FINAL  Final   Organism ID, Bacteria CITROBACTER BRAAKII (A)  Final      Susceptibility   Citrobacter braakii - MIC*    CEFAZOLIN >=64 RESISTANT Resistant     CEFTRIAXONE >=64 RESISTANT Resistant      CIPROFLOXACIN 1 SENSITIVE Sensitive     GENTAMICIN >=16 RESISTANT Resistant     IMIPENEM 1 SENSITIVE Sensitive     NITROFURANTOIN 32 SENSITIVE Sensitive     TRIMETH/SULFA <=20 SENSITIVE Sensitive     PIP/TAZO 16 SENSITIVE Sensitive     * >=100,000 COLONIES/mL CITROBACTER BRAAKII    Labs: BNP (last 3 results) Recent Labs    06/10/17 0930 08/28/17 0829 11/05/17 1557  BNP 788.0* 1,330.0* 0,962.8*   Basic Metabolic Panel: Recent Labs  Lab 11/06/17 0724 11/07/17 0445 11/08/17 0527 11/09/17 0422 11/10/17 0508  NA 139 141 138 139 139  K 3.6 3.8 3.3* 3.8 3.7  CL 101 103  99* 101 99*  CO2 27 28 29 29 30   GLUCOSE 98 87 86 93 71  BUN 43* 43* 40* 38* 38*  CREATININE 1.67* 1.71* 1.52* 1.53* 1.57*  CALCIUM 8.3* 8.5* 8.2* 8.3* 8.3*   Liver Function Tests: Recent Labs  Lab 11/05/17 1556  AST 30  ALT 13*  ALKPHOS 42  BILITOT 1.4*  PROT 7.1  ALBUMIN 2.7*   No results for input(s): LIPASE, AMYLASE in the last 168 hours. No results for input(s): AMMONIA in the last 168 hours. CBC: Recent Labs  Lab 11/05/17 1556  WBC 6.2  NEUTROABS 4.4  HGB 10.0*  HCT 34.4*  MCV 97.2  PLT 221   Cardiac Enzymes: Recent Labs  Lab 11/05/17 1556  TROPONINI <0.03   BNP: Invalid input(s): POCBNP CBG: Recent Labs  Lab 11/09/17 0758 11/09/17 1633 11/09/17 2151 11/10/17 0739 11/10/17 1125  GLUCAP 118* 104* 147* 72 78   D-Dimer No results for input(s): DDIMER in the last 72 hours. Hgb A1c No results for input(s): HGBA1C in the last 72 hours. Lipid Profile No results for input(s): CHOL, HDL, LDLCALC, TRIG, CHOLHDL, LDLDIRECT in the last 72 hours. Thyroid function studies No results for input(s): TSH, T4TOTAL, T3FREE, THYROIDAB in the last 72 hours.  Invalid input(s): FREET3 Anemia work up No results for input(s): VITAMINB12, FOLATE, FERRITIN, TIBC, IRON, RETICCTPCT in the last 72 hours. Urinalysis    Component Value Date/Time   COLORURINE YELLOW 11/05/2017 1650    APPEARANCEUR HAZY (A) 11/05/2017 1650   LABSPEC 1.010 11/05/2017 1650   PHURINE 5.0 11/05/2017 1650   GLUCOSEU NEGATIVE 11/05/2017 1650   HGBUR SMALL (A) 11/05/2017 1650   BILIRUBINUR NEGATIVE 11/05/2017 1650   KETONESUR NEGATIVE 11/05/2017 1650   PROTEINUR 30 (A) 11/05/2017 1650   UROBILINOGEN 0.2 08/03/2013 0936   NITRITE POSITIVE (A) 11/05/2017 1650   LEUKOCYTESUR LARGE (A) 11/05/2017 1650   Sepsis Labs Invalid input(s): PROCALCITONIN,  WBC,  LACTICIDVEN Microbiology Recent Results (from the past 240 hour(s))  Urine Culture     Status: Abnormal   Collection Time: 11/05/17  4:47 PM  Result Value Ref Range Status   Specimen Description URINE, CATHETERIZED  Final   Special Requests NONE  Final   Culture >=100,000 COLONIES/mL CITROBACTER BRAAKII (A)  Final   Report Status 11/08/2017 FINAL  Final   Organism ID, Bacteria CITROBACTER BRAAKII (A)  Final      Susceptibility   Citrobacter braakii - MIC*    CEFAZOLIN >=64 RESISTANT Resistant     CEFTRIAXONE >=64 RESISTANT Resistant     CIPROFLOXACIN 1 SENSITIVE Sensitive     GENTAMICIN >=16 RESISTANT Resistant     IMIPENEM 1 SENSITIVE Sensitive     NITROFURANTOIN 32 SENSITIVE Sensitive     TRIMETH/SULFA <=20 SENSITIVE Sensitive     PIP/TAZO 16 SENSITIVE Sensitive     * >=100,000 COLONIES/mL CITROBACTER BRAAKII   Time coordinating discharge: 36 mins  SIGNED:  Irwin Brakeman, MD  [facility] 11/10/2017, 11:32 AM Pager [phone]  If 7PM-7AM, please contact night-coverage [email or web address] Password [email or web address]

## 2017-11-10 NOTE — Progress Notes (Signed)
Pt in bed with call light, phone and bed control within reach. Nothing needed at time. Awaiting bed arrival at home before EMS is called for transport.

## 2017-11-11 DIAGNOSIS — J9611 Chronic respiratory failure with hypoxia: Secondary | ICD-10-CM | POA: Diagnosis not present

## 2017-11-11 DIAGNOSIS — I5033 Acute on chronic diastolic (congestive) heart failure: Secondary | ICD-10-CM | POA: Diagnosis not present

## 2017-11-11 DIAGNOSIS — L89322 Pressure ulcer of left buttock, stage 2: Secondary | ICD-10-CM | POA: Diagnosis not present

## 2017-11-11 DIAGNOSIS — I130 Hypertensive heart and chronic kidney disease with heart failure and stage 1 through stage 4 chronic kidney disease, or unspecified chronic kidney disease: Secondary | ICD-10-CM | POA: Diagnosis not present

## 2017-11-11 DIAGNOSIS — E1122 Type 2 diabetes mellitus with diabetic chronic kidney disease: Secondary | ICD-10-CM | POA: Diagnosis not present

## 2017-11-11 DIAGNOSIS — N183 Chronic kidney disease, stage 3 (moderate): Secondary | ICD-10-CM | POA: Diagnosis not present

## 2017-11-11 LAB — GLUCOSE, CAPILLARY: Glucose-Capillary: 86 mg/dL (ref 65–99)

## 2017-11-15 DIAGNOSIS — L89322 Pressure ulcer of left buttock, stage 2: Secondary | ICD-10-CM | POA: Diagnosis not present

## 2017-11-15 DIAGNOSIS — N183 Chronic kidney disease, stage 3 (moderate): Secondary | ICD-10-CM | POA: Diagnosis not present

## 2017-11-15 DIAGNOSIS — E1122 Type 2 diabetes mellitus with diabetic chronic kidney disease: Secondary | ICD-10-CM | POA: Diagnosis not present

## 2017-11-15 DIAGNOSIS — I130 Hypertensive heart and chronic kidney disease with heart failure and stage 1 through stage 4 chronic kidney disease, or unspecified chronic kidney disease: Secondary | ICD-10-CM | POA: Diagnosis not present

## 2017-11-15 DIAGNOSIS — I5033 Acute on chronic diastolic (congestive) heart failure: Secondary | ICD-10-CM | POA: Diagnosis not present

## 2017-11-15 DIAGNOSIS — J9611 Chronic respiratory failure with hypoxia: Secondary | ICD-10-CM | POA: Diagnosis not present

## 2017-11-18 ENCOUNTER — Telehealth: Payer: Self-pay | Admitting: *Deleted

## 2017-11-18 NOTE — Patient Outreach (Signed)
[place] [facility]) Care Management  03/20/4853  Madison Reed [birth year] [id number]   Care coordination/transition of care   Upon return to work Cm noted a call from this patient number but no voice message left for CM on 11/17/17 left at 2007    Plans: CM returned a call to [phone] but no answer or availability to leave a voice message - no mail box  CM sent an unsuccessful outreach letter to this patient and pended out 10 business days, pending a return call from patient or family    [name] L. Lavina Hamman, RN, BSN, [place] Coordinator [phone] week day mobile

## 2017-11-19 ENCOUNTER — Other Ambulatory Visit: Payer: Self-pay | Admitting: *Deleted

## 2017-11-19 NOTE — Patient Outreach (Signed)
[place] [facility]) Care Management  11/18/348  CAELIN ROSEN [birth year] [id number]   CSW made an initial attempt to try and contact patient today to perform phone assessment, as well as assess and assist with social needs and services, without success - phone just kept ringing and didn't go to voicemail (ph#: [phone]). CSW will make a second outreach attempt Monday, 11/22/17, if CSW does not receive a return call from patient in the meantime.    Raynaldo Opitz, LCSW [facility]  Clinical Social Worker cell #: [phone]

## 2017-11-22 ENCOUNTER — Other Ambulatory Visit: Payer: Self-pay | Admitting: *Deleted

## 2017-11-22 NOTE — Patient Outreach (Signed)
[place] [facility]) Care Management  7/59/1638  Madison Reed [birth year] [id number]   CSW made second attempt to reach patient, but when CSW called patient's home #: [phone] but patient's daughter, [name] answered the phone and informed CSW that her mother had passed away on 02-Dec-2017. CSW will make [facility] care team aware & close case.    Raynaldo Opitz, LCSW [facility]  Clinical Social Worker cell #: [phone]

## 2017-11-26 ENCOUNTER — Telehealth: Payer: Self-pay | Admitting: *Deleted

## 2017-11-26 ENCOUNTER — Ambulatory Visit: Payer: Medicare Other | Admitting: Student

## 2017-11-26 NOTE — Patient Outreach (Addendum)
[place] [facility]) Care Management  4/69/6295  KARRISA DIDIO [birth year] [id number]   Late entry for 11/22/17 1552   Cm notified by [facility] SW of call to patient daughter reporting patient passed on 12/10/2017  Plans Case reason deceased - discussed with [place] SW aware Unsuccessful letter was previously sent to patient after unable to reach her. No letters sent to MD - never able to reach patient to initiate services-  Kimberly L. Lavina Hamman, RN, BSN, [place] Coordinator [phone] week day mobile

## 2017-12-02 ENCOUNTER — Ambulatory Visit: Payer: Self-pay | Admitting: *Deleted

## 2017-12-10 DIAGNOSIS — 419620001 Death: Secondary | SNOMED CT | POA: Diagnosis not present

## 2017-12-10 DEATH — deceased

## 2018-12-22 IMAGING — CT CT CHEST W/O CM
2 of 3 series · 15 of 36 positions shown, 18 images · non-contrast
Comparison: None.

ADDENDUM:
Consider further evaluation with thyroid ultrasound. If patient is
clinically hyperthyroid, consider nuclear medicine thyroid uptake
and scan.
CLINICAL DATA: Acute respiratory illness, greater than 40 years
old.

EXAM:
CT CHEST WITHOUT CONTRAST
TECHNIQUE: Multidetector CT imaging of the chest was performed following the
standard protocol without IV contrast.

[Series 2: thorax · axial · 0.65mm/px · z∈[+1081,+1321]mm · 12 of 142 slices shown, 15 images]
[im 11/142  mediastinal]
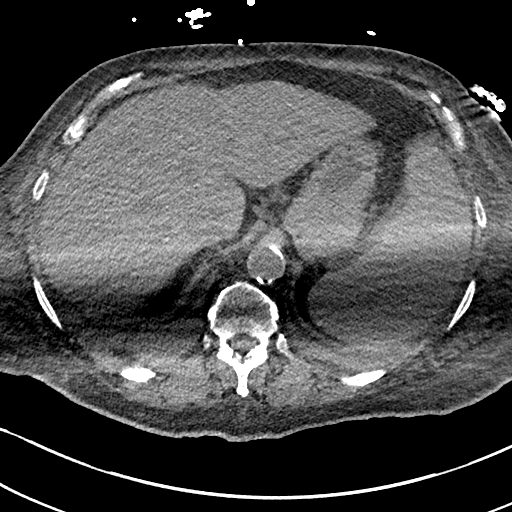
[im 11/142  lung]
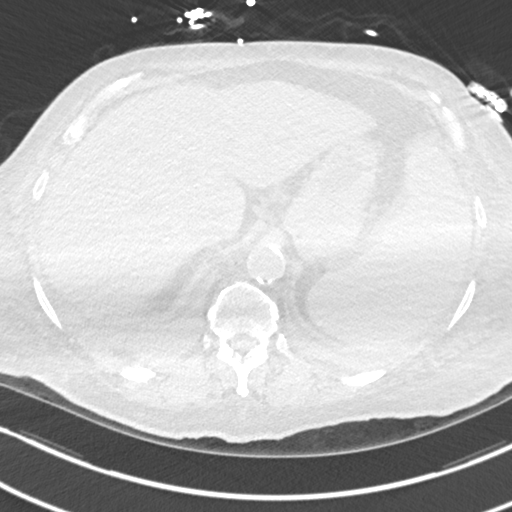
[im 21/142  lung]
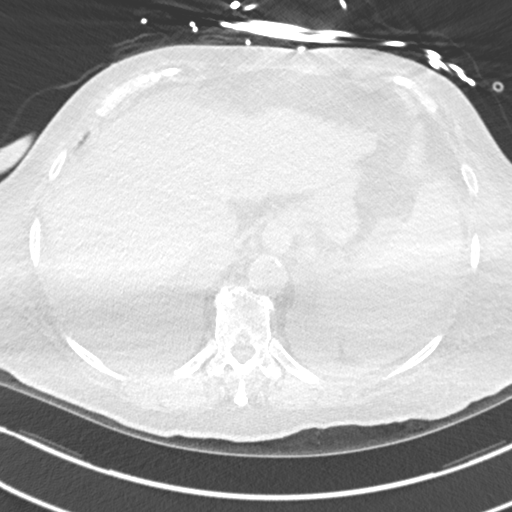
[im 32/142  lung]
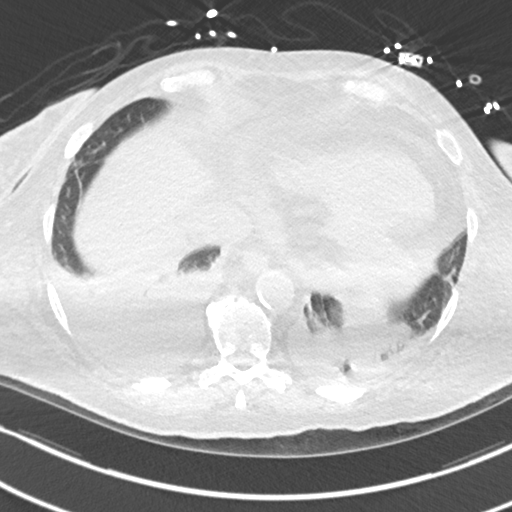
[im 42/142  lung]
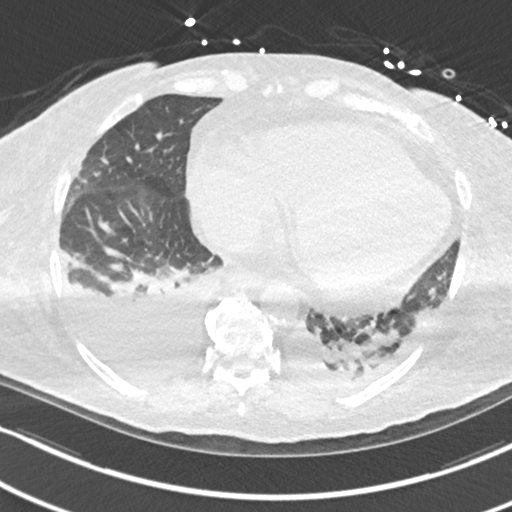
[im 53/142  mediastinal]
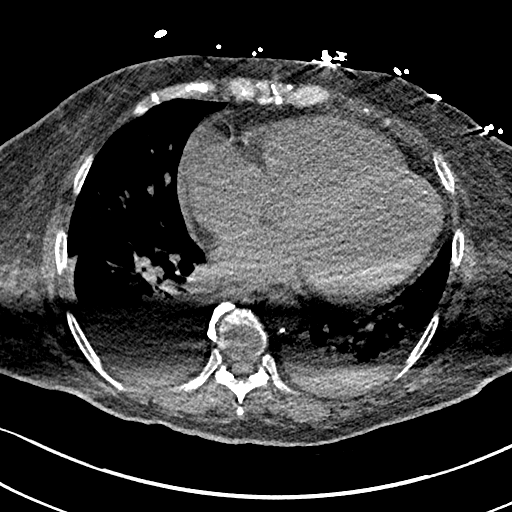
[im 53/142  lung]
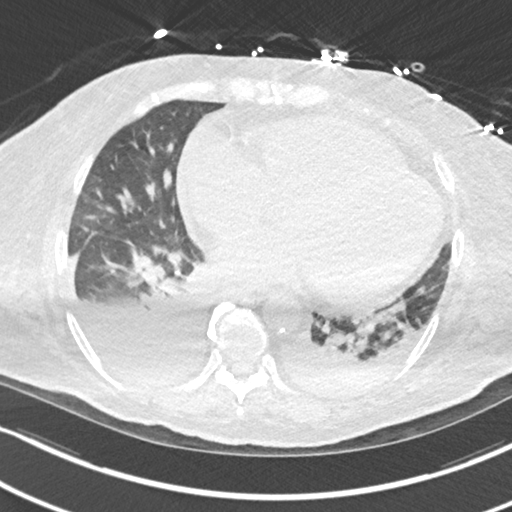
[im 63/142  lung]
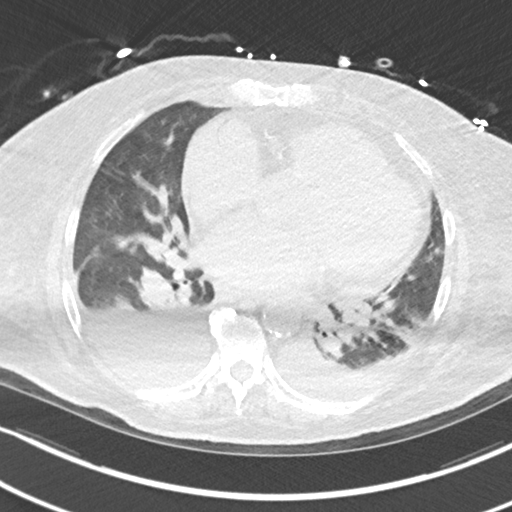
[im 79/142  lung]
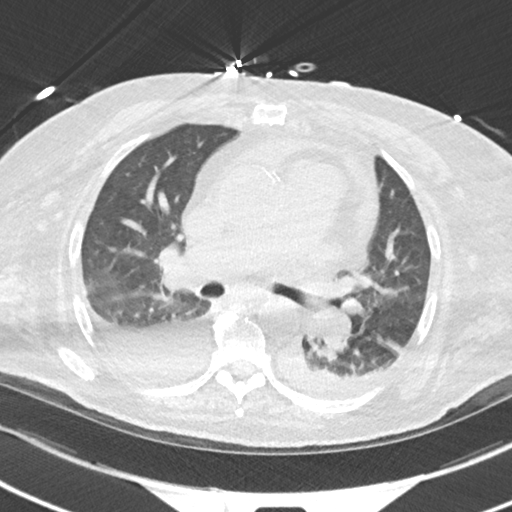
[im 89/142  lung]
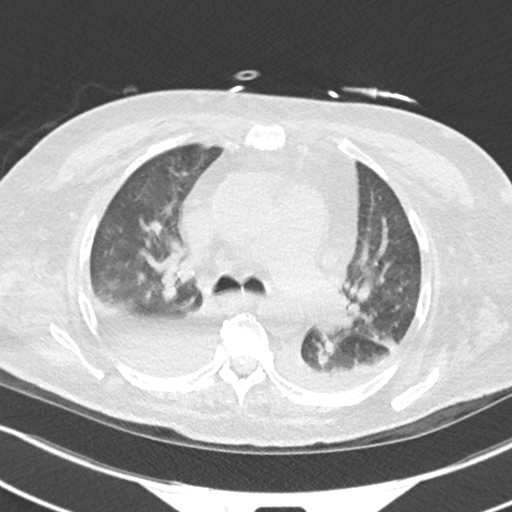
[im 100/142  mediastinal]
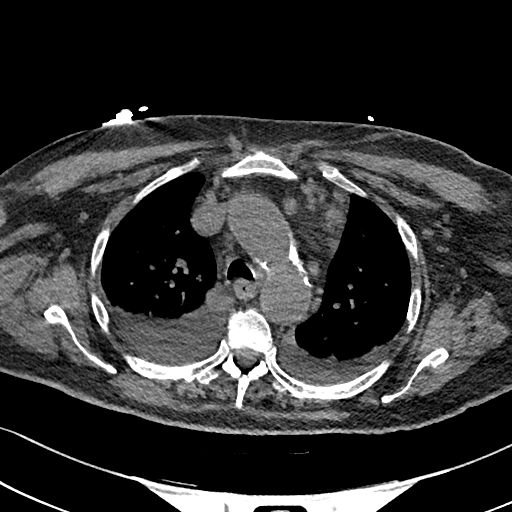
[im 100/142  lung]
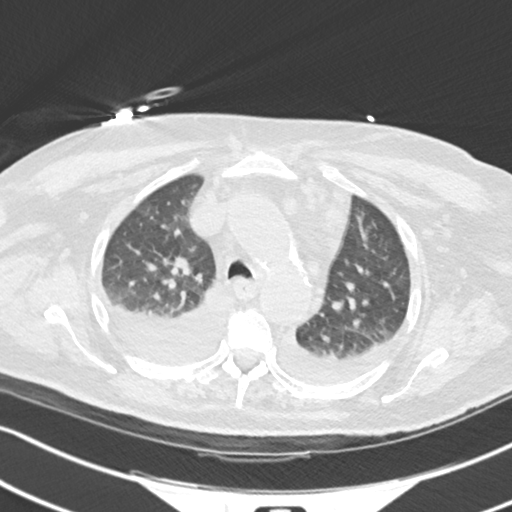
[im 110/142  lung]
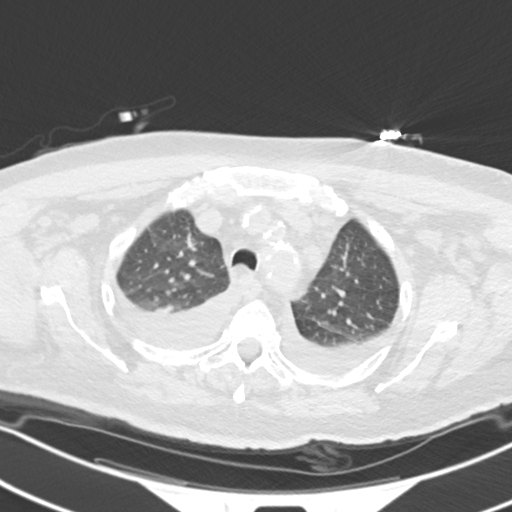
[im 121/142  lung]
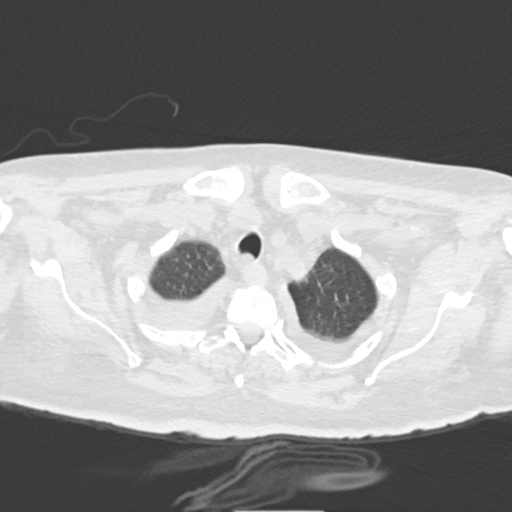
[im 131/142  lung]
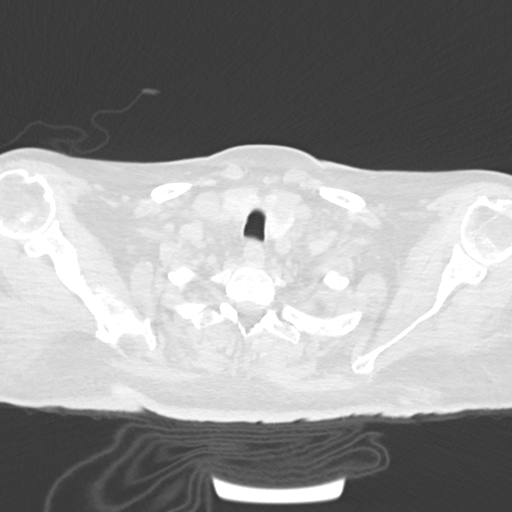

[Series 5: coronal · coronal · 0.59mm/px · 3 of 147 slices shown]
[im 30/147  lung]
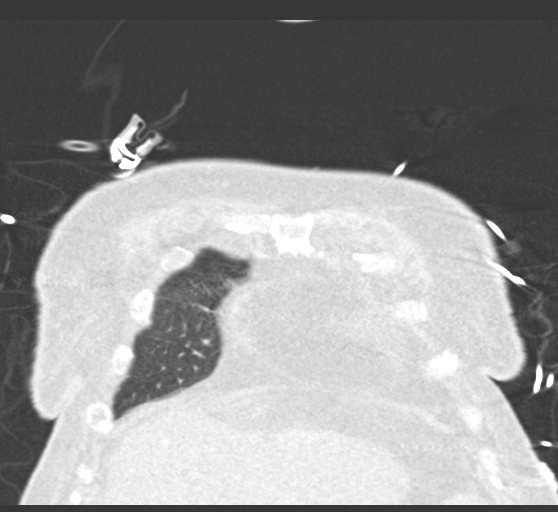
[im 59/147  lung]
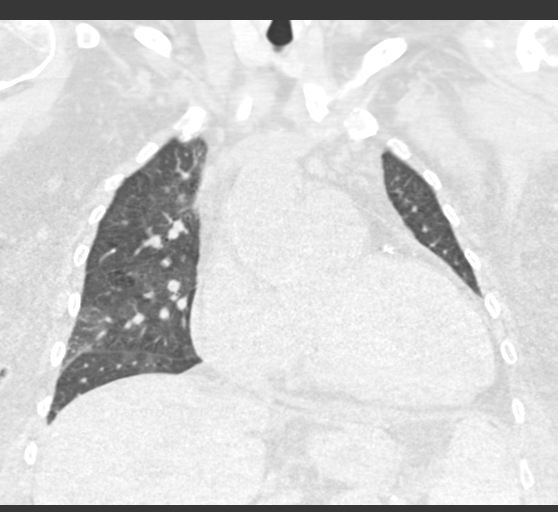
[im 88/147  lung]
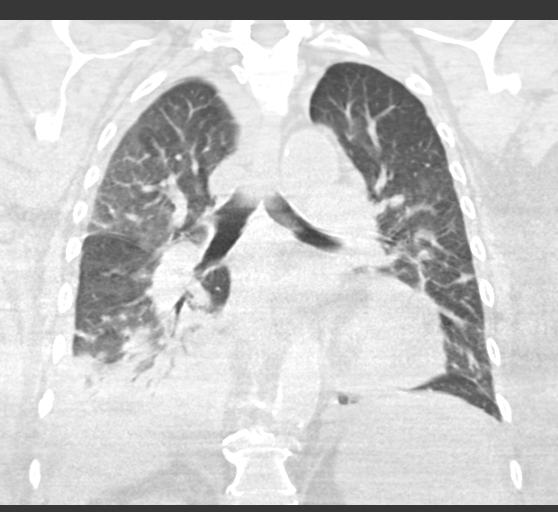

[15 of 36 positions shown; findings below may reference images not displayed]

FINDINGS: Cardiovascular: The heart is mildly enlarged. Diffuse coronary
artery calcifications are present. A small pericardial effusion is
present.

Atherosclerotic calcifications are present in the aorta and branch
vessels. There is mild prominence pulmonary artery is bilaterally.

Mediastinum/Nodes: 10 mm right paratracheal lymph node is evident
just above the carina. Other subcentimeter mediastinal lymph nodes
are present. No definite hilar adenopathy is present. No significant
axillary adenopathy is present. A 1.9 cm hypodense nodule is present
inferiorly in the left lobe of the thyroid.

Lungs/Pleura: Bilateral pleural effusions are present. Diffuse
patchy ground-glass attenuation is present in the lungs bilaterally.
Consolidated bilateral lower lobe airspace disease is noted
bilaterally.

Upper Abdomen: The spleen is enlarged. No focal lesions are evident.

Musculoskeletal: Vertebral body heights and alignment are
maintained. No acute or healing fractures are present. No focal
lytic or blastic lesions are present.
IMPRESSION: 1. Large bilateral scratched at cardiomegaly with prominent
bilateral pleural effusions and diffuse patchy ground-glass
attenuation suggesting edema and congestive heart failure.
2. More prominent airspace consolidation at the bases bilaterally
likely reflects atelectasis. Infection is not excluded.
3. Splenomegaly of uncertain etiology.
4.  Aortic Atherosclerosis ([id number]).

## 2018-12-22 IMAGING — CR DG CHEST 1V PORT
1 series · 1 of 1 positions shown · non-contrast
Comparison: Two-view chest x-ray 06/10/2017

CLINICAL DATA: Fever.  Altered mental status.

EXAM:
PORTABLE CHEST 1 VIEW

[portable]
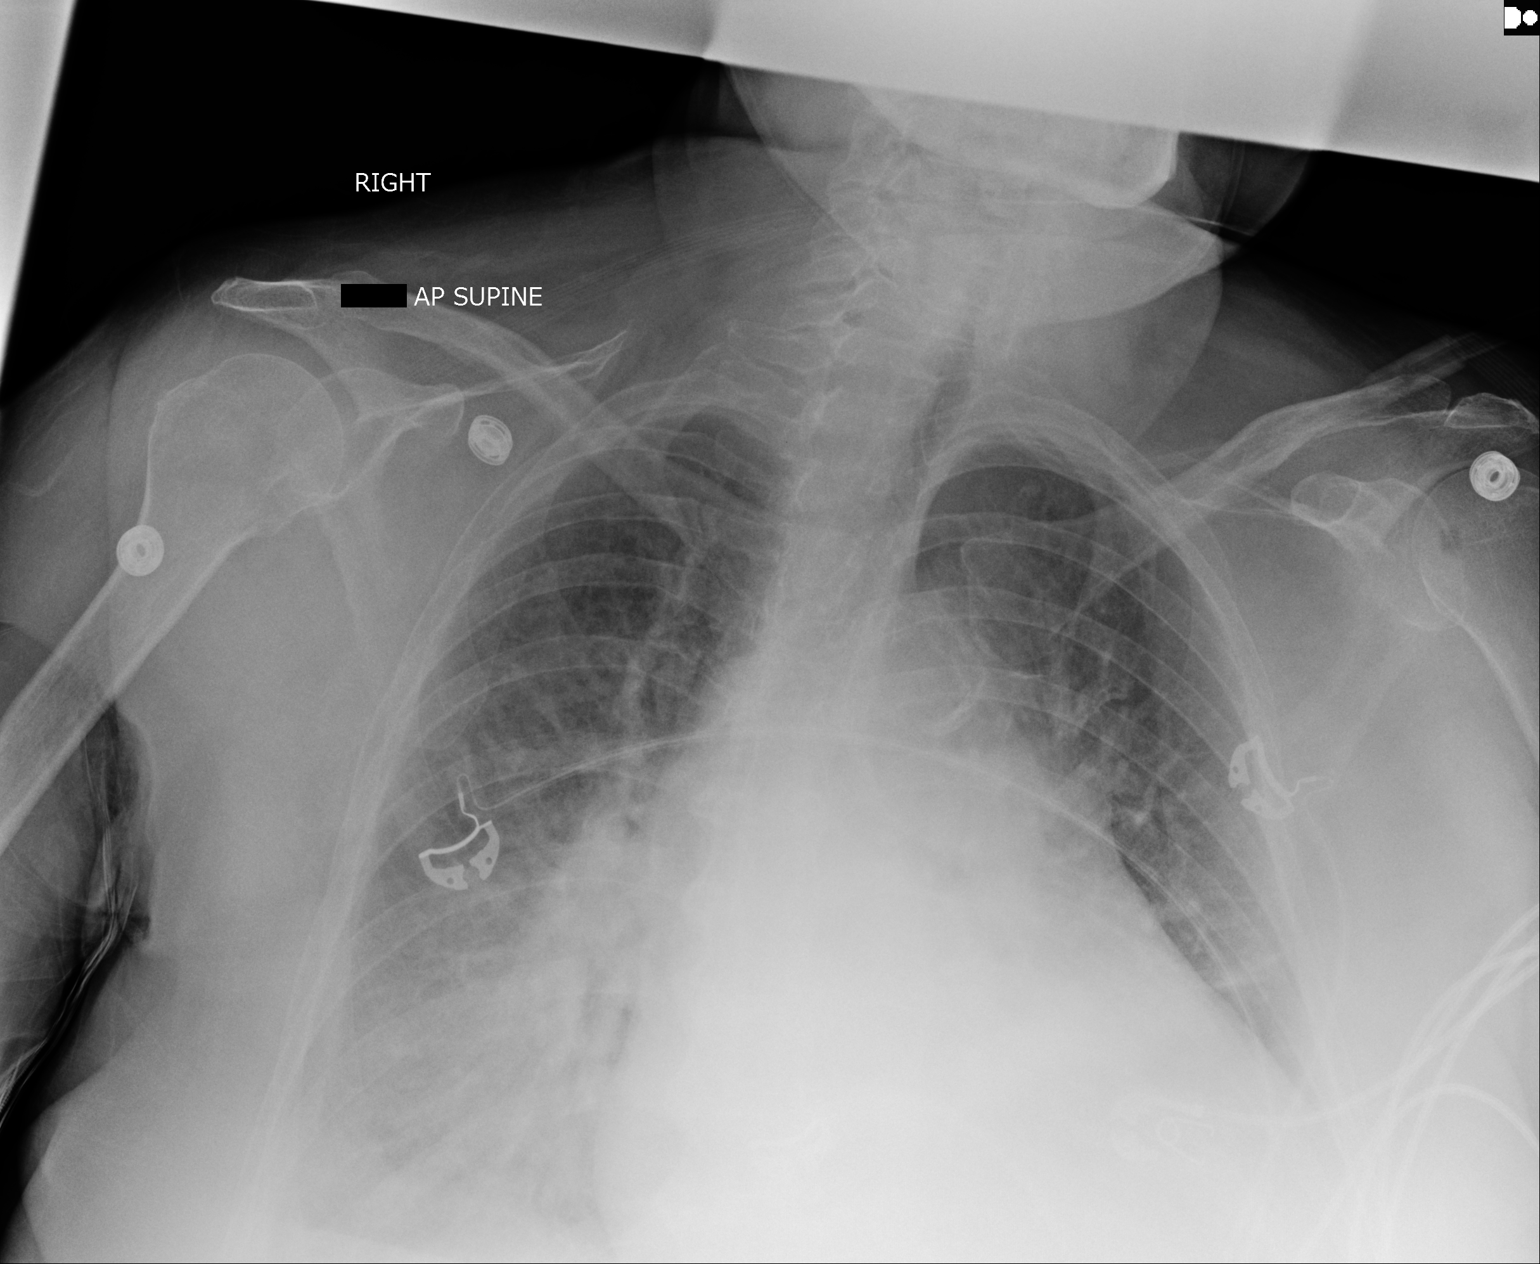

[1 of 1 positions shown; findings below may reference images not displayed]

FINDINGS: The heart is enlarged. Aortic atherosclerosis is present.
Interstitial edema and bilateral effusions are present. Bibasilar
airspace disease is present.
IMPRESSION: 1. Congestive heart failure.
2. Bilateral lower lobe airspace disease likely reflects
atelectasis. Infection or aspiration is also considered.
